# Patient Record
Sex: Female | Born: 1990 | Race: Black or African American | Hispanic: No | Marital: Single | Smoking: Current every day smoker
Health system: Southern US, Community
[De-identification: ages and names within clinical notes are randomized; demographics above are authoritative.]

## PROBLEM LIST (undated history)

## (undated) ENCOUNTER — Inpatient Hospital Stay (HOSPITAL_COMMUNITY): Payer: Self-pay

## (undated) DIAGNOSIS — N159 Renal tubulo-interstitial disease, unspecified: Secondary | ICD-10-CM

## (undated) DIAGNOSIS — B009 Herpesviral infection, unspecified: Secondary | ICD-10-CM

## (undated) DIAGNOSIS — K219 Gastro-esophageal reflux disease without esophagitis: Secondary | ICD-10-CM

## (undated) DIAGNOSIS — D563 Thalassemia minor: Secondary | ICD-10-CM

## (undated) DIAGNOSIS — F32A Depression, unspecified: Secondary | ICD-10-CM

## (undated) DIAGNOSIS — D649 Anemia, unspecified: Secondary | ICD-10-CM

## (undated) DIAGNOSIS — I10 Essential (primary) hypertension: Secondary | ICD-10-CM

## (undated) DIAGNOSIS — F419 Anxiety disorder, unspecified: Secondary | ICD-10-CM

## (undated) HISTORY — PX: NO PAST SURGERIES: SHX2092

---
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
## 2011-03-07 LAB — POCT RAPID STREP A: Streptococcus, Group A Screen (Direct): POSITIVE — AB

## 2011-05-12 ENCOUNTER — Inpatient Hospital Stay (HOSPITAL_COMMUNITY)
Admission: AD | Admit: 2011-05-12 | Discharge: 2011-05-13 | Disposition: A | Discharge status: Home / Self Care | Payer: Medicaid Other | Source: Ambulatory Visit | Attending: Obstetrics and Gynecology | Admitting: Obstetrics and Gynecology

## 2011-05-12 ENCOUNTER — Encounter (HOSPITAL_COMMUNITY): Payer: Self-pay | Admitting: *Deleted

## 2011-05-12 ENCOUNTER — Inpatient Hospital Stay (HOSPITAL_COMMUNITY): Payer: Medicaid Other

## 2011-05-12 DIAGNOSIS — Z30431 Encounter for routine checking of intrauterine contraceptive device: Secondary | ICD-10-CM | POA: Insufficient documentation

## 2011-05-12 DIAGNOSIS — N938 Other specified abnormal uterine and vaginal bleeding: Secondary | ICD-10-CM | POA: Insufficient documentation

## 2011-05-12 DIAGNOSIS — N949 Unspecified condition associated with female genital organs and menstrual cycle: Secondary | ICD-10-CM

## 2011-05-12 HISTORY — DX: Herpesviral infection, unspecified: B00.9

## 2011-05-12 LAB — URINALYSIS, ROUTINE W REFLEX MICROSCOPIC
Glucose, UA: NEGATIVE mg/dL
Ketones, ur: NEGATIVE mg/dL
Protein, ur: NEGATIVE mg/dL
Urobilinogen, UA: 0.2 mg/dL (ref 0.0–1.0)

## 2011-05-12 LAB — WET PREP, GENITAL
Trich, Wet Prep: NONE SEEN
Yeast Wet Prep HPF POC: NONE SEEN

## 2011-05-12 LAB — CBC
HCT: 34.3 % — ABNORMAL LOW (ref 36.0–46.0)
Hemoglobin: 11.5 g/dL — ABNORMAL LOW (ref 12.0–15.0)
MCH: 26.6 pg (ref 26.0–34.0)
MCHC: 33.5 g/dL (ref 30.0–36.0)

## 2011-05-12 LAB — URINE MICROSCOPIC-ADD ON

## 2011-05-12 NOTE — Progress Notes (Signed)
Pt states, " [facility] my [facility] ( mirana) inserted. I have bleed ever since and even pass clots and have had cramping and sharp back pain."

## 2011-05-12 NOTE — Progress Notes (Addendum)
Had IUD placed by [facility] on 02/02/11.  States has been bleeding since then, on average 4-6 pad changes/day, sometimes brown discharge.  C/o cramping and low back pain.

## 2011-05-12 NOTE — ED Provider Notes (Signed)
History     Chief Complaint  Patient presents with  . Vaginal Bleeding  . Dysmenorrhea   HPI  Had IUD placed by [facility] on 02/02/11. States has been bleeding, similar to a period, since then, on average 4-6 pad changes/day, sometimes brown discharge. C/o cramping and low back pain.  Denies dysuria or abnormal vaginal discharge.     Past Medical History  Diagnosis Date  . Migraine   . 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 No past surgeries     No family history on file.  History  Substance Use Topics  . Smoking status: Current Everyday Smoker -- 0.2 packs/day    Types: Cigarettes  . Smokeless tobacco: Never Used  . Alcohol Use: No    Allergies: No Known Allergies  Prescriptions prior to admission  Medication Sig Dispense Refill  . levonorgestrel (MIRENA) 20 MCG/24HR IUD 1 each by Intrauterine route once.          Review of Systems  Gastrointestinal: Positive for abdominal pain.  Genitourinary:       Vaginal bleeding  Musculoskeletal: Positive for back pain.  All other systems reviewed and are negative.   Physical Exam   Blood pressure 117/73, pulse 106, temperature 99.5 F (37.5 C), temperature source Oral, height 5' (1.524 m), weight 71.668 kg (158 lb), last menstrual period 02/02/2011.  Physical Exam  Constitutional: She is oriented to person, place, and time. She appears well-developed and well-nourished.  HENT:  Head: Normocephalic.  Neck: Normal range of motion. Neck supple.  Cardiovascular: Normal rate, regular rhythm and normal heart sounds.   Respiratory: Effort normal and breath sounds normal.  GI: Soft. She exhibits no mass. There is tenderness (suprapubic). There is no guarding.  Genitourinary: There is bleeding (negative clots; scant) around the vagina.  Neurological: She is alert and oriented to person, place, and time.  Skin: Skin is warm and dry.    MAU Course  Procedures  Wet prep - few clue,  neg trich, neg yeast GC/CT - pend UA - trace leuks Ultrasound -  IMPRESSION:  1. Normal study. No evidence of pelvic mass or other significant  abnormality.  2. Intrauterine device noted in expected position, at the fundus  of the uterus.  3. Trace fluid noted within the cervical canal.  CBC - hgb 11.5  Assessment and Plan  Bleeding with IUD  Plan: DC to home Naproxen 500mg  BID x 5 days every 4 weeks x 3 months.   GC/CT pending Urine culture  [facility] 05/12/2011, 10:12 PM

## 2011-05-13 MED ORDER — NAPROXEN 500 MG PO TABS: 500.0000 mg | ORAL_TABLET | Freq: Two times a day (BID) | ORAL | Status: DC

## 2011-05-15 LAB — GC/CHLAMYDIA PROBE AMP, GENITAL: Chlamydia, DNA Probe: POSITIVE — AB

## 2011-05-15 NOTE — ED Provider Notes (Signed)
Agree with above note.  Stacey [name] 05/15/2011 8:50 AM

## 2011-07-08 ENCOUNTER — Encounter (HOSPITAL_COMMUNITY): Payer: Self-pay | Admitting: Emergency Medicine

## 2011-07-08 ENCOUNTER — Emergency Department (HOSPITAL_COMMUNITY)
Admission: EM | Admit: 2011-07-08 | Discharge: 2011-07-08 | Disposition: A | Discharge status: Home / Self Care | Payer: Self-pay | Attending: Emergency Medicine | Admitting: Emergency Medicine

## 2011-07-08 DIAGNOSIS — Z79899 Other long term (current) drug therapy: Secondary | ICD-10-CM | POA: Insufficient documentation

## 2011-07-08 DIAGNOSIS — M546 Pain in thoracic spine: Secondary | ICD-10-CM | POA: Insufficient documentation

## 2011-07-08 DIAGNOSIS — G43909 Migraine, unspecified, not intractable, without status migrainosus: Secondary | ICD-10-CM | POA: Insufficient documentation

## 2011-07-08 DIAGNOSIS — R110 Nausea: Secondary | ICD-10-CM | POA: Insufficient documentation

## 2011-07-08 DIAGNOSIS — M542 Cervicalgia: Secondary | ICD-10-CM | POA: Insufficient documentation

## 2011-07-08 DIAGNOSIS — N898 Other specified noninflammatory disorders of vagina: Secondary | ICD-10-CM | POA: Insufficient documentation

## 2011-07-08 MED ORDER — SODIUM CHLORIDE 0.9 % IV BOLUS (SEPSIS)
1000.0000 mL | Freq: Once | INTRAVENOUS | Status: AC
  Administered 2011-07-08: 1000 mL via INTRAVENOUS

## 2011-07-08 MED ORDER — METOCLOPRAMIDE HCL 5 MG/ML IJ SOLN
10.0000 mg | Freq: Once | INTRAMUSCULAR | Status: AC
  Administered 2011-07-08: 10 mg via INTRAVENOUS
  Filled 2011-07-08: qty 2

## 2011-07-08 MED ORDER — KETOROLAC TROMETHAMINE 30 MG/ML IJ SOLN
30.0000 mg | Freq: Once | INTRAMUSCULAR | Status: AC
  Administered 2011-07-08: 30 mg via INTRAVENOUS
  Filled 2011-07-08: qty 1

## 2011-07-08 MED ORDER — DIPHENHYDRAMINE HCL 50 MG/ML IJ SOLN
25.0000 mg | Freq: Once | INTRAMUSCULAR | Status: AC
  Administered 2011-07-08: 25 mg via INTRAVENOUS
  Filled 2011-07-08: qty 1

## 2011-07-08 NOTE — ED Provider Notes (Signed)
History     CSN: [id number]  Arrival date & time 07/08/11  1550   First MD Initiated Contact with Patient 07/08/11 1803      Chief Complaint  Patient presents with  . Headache    (Consider location/radiation/quality/duration/timing/severity/associated sxs/prior treatment) Patient is a 21 y.o. female presenting with headaches. The history is provided by the patient.  Headache  This is a new problem. Episode onset: about 4 days ago. The problem occurs constantly. The problem has not changed since onset.The headache is associated with activity. Pain location: global. The quality of the pain is described as dull and throbbing. The pain is moderate. Radiates to: down her neck and upper back. Associated symptoms include nausea. Pertinent negatives include no anorexia, no fever, no malaise/fatigue, no chest pressure, no near-syncope, no orthopnea, no palpitations, no syncope, no shortness of breath and no vomiting. She has tried NSAIDs (tylenol) for the symptoms. The treatment provided mild relief.    Past Medical History  Diagnosis Date  . Migraine   . 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 Preeclampsia     Past Surgical History  Procedure Date  . No past surgeries     No family history on file.  History  Substance Use Topics  . Smoking status: Current Everyday Smoker -- 0.2 packs/day    Types: Cigarettes  . Smokeless tobacco: Never Used  . Alcohol Use: No    OB History    Grav Para Term Preterm Abortions TAB SAB Ect Mult Living   1 1 1       1       Review of Systems  Constitutional: Negative for fever, chills, malaise/fatigue, activity change, appetite change and fatigue.  HENT: Positive for neck pain (headache radiates to neck). Negative for congestion, sore throat, rhinorrhea and neck stiffness.   Eyes: Negative for photophobia, redness and visual disturbance.  Respiratory: Negative for cough, shortness of breath and wheezing.   Cardiovascular: Negative for chest  pain, palpitations, orthopnea, leg swelling, syncope and near-syncope.  Gastrointestinal: Positive for nausea. Negative for vomiting, abdominal pain, diarrhea, constipation, blood in stool, abdominal distention and anorexia.  Genitourinary: Positive for vaginal bleeding (spotting for 5 months since mirena IUD placed. ). Negative for dysuria, urgency, hematuria, flank pain, vaginal pain and pelvic pain.  Musculoskeletal: Negative for back pain.  Skin: Negative for rash and wound.  Neurological: Positive for headaches. Negative for dizziness, seizures, facial asymmetry, speech difficulty, weakness, light-headedness and numbness.  Psychiatric/Behavioral: Negative for confusion.  All other systems reviewed and are negative.    Allergies  Review of patient's allergies indicates no known allergies.  Home Medications   Current Outpatient Rx  Name Route Sig Dispense Refill  . AZITHROMYCIN 500 MG PO TABS Oral Take 1,000 mg by mouth once.    . CYCLOBENZAPRINE HCL 10 MG PO TABS Oral Take 10 mg by mouth 3 (three) times daily as needed. For muscle spasms    . DIPHENHYDRAMINE-APAP (SLEEP) 25-500 MG PO TABS Oral Take 1 tablet by mouth at bedtime as needed. For pain    . LEVONORGESTREL 20 MCG/24HR IU IUD Intrauterine 1 each by Intrauterine route once.      Marland Kitchen LORATADINE 10 MG PO TABS Oral Take 10 mg by mouth daily.    . MELOXICAM 15 MG PO TABS Oral Take 15 mg by mouth daily.      BP 121/66  Pulse 88  Temp(Src) 98.8 F (37.1 C) (Oral)  Resp 18  SpO2  97%  Physical Exam  Nursing note and vitals reviewed. Constitutional: She is oriented to person, place, and time. She appears well-developed and well-nourished.  Non-toxic appearance. No distress.  HENT:  Head: Normocephalic and atraumatic.  Mouth/Throat: Oropharynx is clear and moist.  Eyes: Conjunctivae and EOM are normal. Pupils are equal, round, and reactive to light. No scleral icterus.  Neck: Normal range of motion, full passive range of  motion without pain and phonation normal. Neck supple. No JVD present. No spinous process tenderness present. No rigidity. Normal range of motion present. No Brudzinski's sign and no Kernig's sign noted.  Cardiovascular: Normal rate, regular rhythm, normal heart sounds and intact distal pulses.   No murmur heard. Pulmonary/Chest: Effort normal and breath sounds normal. No respiratory distress. She has no wheezes. She has no rales.  Abdominal: Soft. Bowel sounds are normal. She exhibits no distension. There is no tenderness. There is no rebound and no guarding.  Musculoskeletal: Normal range of motion.  Neurological: She is alert and oriented to person, place, and time. She has normal strength and normal reflexes. No cranial nerve deficit or sensory deficit. She exhibits normal muscle tone. Coordination normal. GCS eye subscore is 4. GCS verbal subscore is 5. GCS motor subscore is 6.  Skin: Skin is warm and dry. No rash noted. She is not diaphoretic.  Psychiatric: She has a normal mood and affect. Her speech is normal and behavior is normal. Cognition and memory are normal.    ED Course  Procedures (including critical care time)   Labs Reviewed  POCT PREGNANCY, URINE  POCT PREGNANCY, URINE   No results found.   1. Migraine       MDM  20yo AAF with PMH significant for migraine headaches and chronic back pain who presents to the ED due to headache. HA like prior migraines. HA global dull and throbbing headache with some nausea and scotomas with activity. Pain radiates down back of her head to neck and upper back like prior headaches. Also has chronic back pain. No hx fever. Afebrile in ED. Neck supple no meningeal signs. Not c/w meningitis or SAH. Likely migraine as similar to prior episodes. Pt told RN she has vaginal spotting when asked but this has been going on since she had her IUD placed about 5 months ago. Giving IVF with reglan and benadryl for migraine tx. Will reassess.   Pt  mostly improved. Giving toradol for additional relief.   Pt reassessed and feeling better ready to go home. Will d/c.       Verne Carrow, MD 07/08/11 2226

## 2011-07-08 NOTE — ED Notes (Signed)
Patient here for headache x 4 days, sts that she also has upper neck and upper back pain, requesting a ct scan. sts intermittent vaginal bleeding since august when iud was placed, nad noted, abc intact, resp e/u

## 2011-07-08 NOTE — ED Notes (Addendum)
C/o headache x 4 days.  States she started having neck pain and thoracic back pain 2 days ago.  Also reports dizziness.  Pt reports daily vaginal bleeding since having IUD placed August 2012.

## 2011-07-18 NOTE — ED Provider Notes (Signed)
Evaluation and management procedures were performed by the resident physician under my supervision/collaboration.    Faun Mcqueen D Rosanna Bickle, MD 07/18/11 1954 

## 2011-11-11 ENCOUNTER — Inpatient Hospital Stay (HOSPITAL_COMMUNITY)
Admission: AD | Admit: 2011-11-11 | Discharge: 2011-11-11 | Disposition: A | Discharge status: Home / Self Care | Payer: Self-pay | Source: Ambulatory Visit | Attending: Obstetrics & Gynecology | Admitting: Obstetrics & Gynecology

## 2011-11-11 ENCOUNTER — Encounter (HOSPITAL_COMMUNITY): Payer: Self-pay

## 2011-11-11 ENCOUNTER — Inpatient Hospital Stay (HOSPITAL_COMMUNITY): Payer: Self-pay

## 2011-11-11 DIAGNOSIS — R109 Unspecified abdominal pain: Secondary | ICD-10-CM | POA: Insufficient documentation

## 2011-11-11 DIAGNOSIS — N390 Urinary tract infection, site not specified: Secondary | ICD-10-CM

## 2011-11-11 DIAGNOSIS — N949 Unspecified condition associated with female genital organs and menstrual cycle: Secondary | ICD-10-CM | POA: Insufficient documentation

## 2011-11-11 DIAGNOSIS — N938 Other specified abnormal uterine and vaginal bleeding: Secondary | ICD-10-CM | POA: Insufficient documentation

## 2011-11-11 DIAGNOSIS — R1030 Lower abdominal pain, unspecified: Secondary | ICD-10-CM

## 2011-11-11 HISTORY — DX: Gastro-esophageal reflux disease without esophagitis: K21.9

## 2011-11-11 LAB — CBC
HCT: 35.6 % — ABNORMAL LOW (ref 36.0–46.0)
Hemoglobin: 11.7 g/dL — ABNORMAL LOW (ref 12.0–15.0)
MCH: 26.4 pg (ref 26.0–34.0)
MCHC: 32.9 g/dL (ref 30.0–36.0)
MCV: 80.4 fL (ref 78.0–100.0)
Platelets: 358 10*3/uL (ref 150–400)
RBC: 4.43 MIL/uL (ref 3.87–5.11)
RDW: 14.8 % (ref 11.5–15.5)
WBC: 5.7 10*3/uL (ref 4.0–10.5)

## 2011-11-11 LAB — WET PREP, GENITAL
Clue Cells Wet Prep HPF POC: NONE SEEN
Trich, Wet Prep: NONE SEEN
Yeast Wet Prep HPF POC: NONE SEEN

## 2011-11-11 LAB — URINE MICROSCOPIC-ADD ON

## 2011-11-11 LAB — POCT PREGNANCY, URINE: Preg Test, Ur: NEGATIVE

## 2011-11-11 LAB — URINALYSIS, ROUTINE W REFLEX MICROSCOPIC
Glucose, UA: NEGATIVE mg/dL
Ketones, ur: NEGATIVE mg/dL
Leukocytes, UA: NEGATIVE
Specific Gravity, Urine: 1.02 (ref 1.005–1.030)
pH: 7.5 (ref 5.0–8.0)

## 2011-11-11 MED ORDER — KETOROLAC TROMETHAMINE 10 MG PO TABS
10.0000 mg | ORAL_TABLET | Freq: Once | ORAL | Status: AC
  Administered 2011-11-11: 10 mg via ORAL
  Filled 2011-11-11: qty 1

## 2011-11-11 MED ORDER — CIPROFLOXACIN HCL 500 MG PO TABS: 500.0000 mg | ORAL_TABLET | Freq: Two times a day (BID) | ORAL | Status: AC

## 2011-11-11 MED ORDER — KETOROLAC TROMETHAMINE 10 MG PO TABS: 10.0000 mg | ORAL_TABLET | Freq: Once | ORAL | Status: AC

## 2011-11-11 NOTE — MAU Provider Note (Signed)
Stacey [name] 2.  No chief complaint on file.    First Provider Initiated Contact with Patient 11/11/11 2008      SUBJECTIVE  HPI: Pt presents to MAU reporting bleeding and cramping x 5-6 months since Mirena placed 8/12, stopped x ~2 months. Severe cramping returned 1 month ago. Frequency ix 1 week.  Spotting started today. Saw gynecologist several months ago, states she had pelvic exam, told everything was normal with the Mirena. Denies fever, chills, flank pain, dysuria, hematuria, change in sex partners. Rates cramping 10/10 at worst. There are no aggravating or aleviating factors.   Past Medical History  Diagnosis Date  . Migraine   . 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 Preeclampsia   . Pregnancy induced hypertension   . GERD (gastroesophageal reflux disease)    Past Surgical History  Procedure Date  . No past surgeries    History   Social History  . Marital Status: Single    Spouse Name: N/A    Number of Children: N/A  . Years of Education: N/A   Occupational History  . Not on file.   Social History Main Topics  . Smoking status: Current Everyday Smoker -- 0.2 packs/day    Types: Cigarettes  . Smokeless tobacco: Never Used  . Alcohol Use: No  . Drug Use: No  . Sexually Active: Yes    Birth Control/ Protection: IUD     pt reports she has had vaginal bleeding daily since having IUD placed in August 2012   Other Topics Concern  . Not on file   Social History Narrative  . No narrative on file   No current facility-administered medications on file prior to encounter.   Current Outpatient Prescriptions on File Prior to Encounter  Medication Sig Dispense Refill  . azithromycin (ZITHROMAX) 500 MG tablet Take 1,000 mg by mouth once.      . cyclobenzaprine (FLEXERIL) 10 MG tablet Take 10 mg by mouth 3 (three) times daily as needed. For muscle spasms      . diphenhydramine-acetaminophen (TYLENOL PM) 25-500 MG TABS Take 1 tablet by mouth at bedtime  as needed. For pain      . levonorgestrel (MIRENA) 20 MCG/24HR IUD 1 each by Intrauterine route once.       . loratadine (CLARITIN) 10 MG tablet Take 10 mg by mouth daily.      . meloxicam (MOBIC) 15 MG tablet Take 15 mg by mouth daily.       No Known Allergies  ROS: Pertinent items in HPI  OBJECTIVE Blood pressure 141/85, pulse 103, temperature 99.2 F (37.3 C), temperature source Oral, resp. rate 16, height 5' (1.524 m), weight 68.584 kg (151 lb 3.2 oz), unknown if currently breastfeeding.  GENERAL: Well-developed, well-nourished female in no acute distress.  HEENT: Normocephalic, good dentition HEART: normal rate RESP: normal effort ABDOMEN: Soft, non-tender. No CVAT. EXTREMITIES: Nontender, no edema NEURO: Alert and oriented SPECULUM EXAM: NEFG, physiologic discharge, clear mucus in os, scant blood noted, cervix non-friable. Mirena strings visible. Devise not visible. BIMANUAL: cervix closed; uterus non-tender, non-pregnant size; no adnexal tenderness or masses. No CMT   LAB RESULTS Results for orders placed during the hospital encounter of 11/11/11 (from the past 24 hour(s))  URINALYSIS, ROUTINE W REFLEX MICROSCOPIC     Status: Abnormal   Collection Time   11/11/11  7:25 PM      Component Value Range   Color, Urine YELLOW  YELLOW  APPearance CLEAR  CLEAR    Specific Gravity, Urine 1.020  1.005 - 1.030    pH 7.5  5.0 - 8.0    Glucose, UA NEGATIVE  NEGATIVE (mg/dL)   Hgb urine dipstick TRACE (*) NEGATIVE    Bilirubin Urine NEGATIVE  NEGATIVE    Ketones, ur NEGATIVE  NEGATIVE (mg/dL)   Protein, ur NEGATIVE  NEGATIVE (mg/dL)   Urobilinogen, UA 0.2  0.0 - 1.0 (mg/dL)   Nitrite NEGATIVE  NEGATIVE    Leukocytes, UA NEGATIVE  NEGATIVE   URINE MICROSCOPIC-ADD ON     Status: Normal   Collection Time   11/11/11  7:25 PM      Component Value Range   Squamous Epithelial / LPF RARE  RARE    RBC / HPF 0-2  <3 (RBC/hpf)  POCT PREGNANCY, URINE     Status: Normal   Collection Time    11/11/11  7:38 PM      Component Value Range   Preg Test, Ur NEGATIVE  NEGATIVE   CBC     Status: Abnormal   Collection Time   11/11/11  8:13 PM      Component Value Range   WBC 5.7  4.0 - 10.5 (K/uL)   RBC 4.43  3.87 - 5.11 (MIL/uL)   Hemoglobin 11.7 (*) 12.0 - 15.0 (g/dL)   HCT 45.4 (*) 09.8 - 46.0 (%)   MCV 80.4  78.0 - 100.0 (fL)   MCH 26.4  26.0 - 34.0 (pg)   MCHC 32.9  30.0 - 36.0 (g/dL)   RDW 11.9  14.7 - 82.9 (%)   Platelets 358  150 - 400 (K/uL)   IMAGING US Transvaginal Non-ob  11/11/2011  *RADIOLOGY REPORT*  Clinical Data: Spotting and cramping.  Intrauterine device.  TRANSABDOMINAL AND TRANSVAGINAL ULTRASOUND OF PELVIS Technique:  Both transabdominal and transvaginal ultrasound examinations of the pelvis were performed. Transabdominal technique was performed for global imaging of the pelvis including uterus, ovaries, adnexal regions, and pelvic cul-de-sac.  Comparison: 05/12/2011   It was necessary to proceed with endovaginal exam following the transabdominal exam to visualize the uterus, ovaries, and endometrium.  Findings:  Uterus: The uterus appears retroverted and measures about 6.6 x 3.3 x 4.3 cm.  No focal myometrial mass lesions.  Limited visualization of the cervix due to the position of the uterus.  Endometrium: Endometrial stripe thickness measures about 5 mm. Echogenic structure consistent with intrauterine device is present in the endometrium in the fundal location.  No abnormal endometrial fluid collections.  Right ovary:  Right ovary measures 3.2 x 1.8 x 2.8 cm.  Normal follicular changes.  Color flow is demonstrated.  Left ovary: Left ovary measures 3.2 x 1.7 x 2 cm.  Normal follicular changes.  Color flow is demonstrated.  Other findings: No free fluid  IMPRESSION: Intrauterine device appears in place.  No abnormal adnexal masses. No free pelvic fluid collections.  Original Report Authenticated By: Marlon Pel, M.D.   US Pelvis Complete  11/11/2011   *RADIOLOGY REPORT*  Clinical Data: Spotting and cramping.  Intrauterine device.  TRANSABDOMINAL AND TRANSVAGINAL ULTRASOUND OF PELVIS Technique:  Both transabdominal and transvaginal ultrasound examinations of the pelvis were performed. Transabdominal technique was performed for global imaging of the pelvis including uterus, ovaries, adnexal regions, and pelvic cul-de-sac.  Comparison: 05/12/2011   It was necessary to proceed with endovaginal exam following the transabdominal exam to visualize the uterus, ovaries, and endometrium.  Findings:  Uterus: The uterus appears retroverted and measures about 6.6  x 3.3 x 4.3 cm.  No focal myometrial mass lesions.  Limited visualization of the cervix due to the position of the uterus.  Endometrium: Endometrial stripe thickness measures about 5 mm. Echogenic structure consistent with intrauterine device is present in the endometrium in the fundal location.  No abnormal endometrial fluid collections.  Right ovary:  Right ovary measures 3.2 x 1.8 x 2.8 cm.  Normal follicular changes.  Color flow is demonstrated.  Left ovary: Left ovary measures 3.2 x 1.7 x 2 cm.  Normal follicular changes.  Color flow is demonstrated.  Other findings: No free fluid  IMPRESSION: Intrauterine device appears in place.  No abnormal adnexal masses. No free pelvic fluid collections.  Original Report Authenticated By: Marlon Pel, M.D.   Received Toradol for pian, but did not feel as if she needed to wait for onset of action before D/C. NAD.  ASSESSMENT IUD properly positioned 1. UTI (lower urinary tract infection)   2. Lower abdominal pain    PLAN D/C home. Follow-up Information    Follow up with [facility]. (As needed if symptoms worsen)    Contact information:   [address] [place] [address] [phone]        Medication List  As of 11/12/2011  2:23 AM   START taking these medications         ciprofloxacin 500 MG tablet   Commonly  known as: CIPRO   Take 1 tablet (500 mg total) by mouth 2 (two) times daily.      ketorolac 10 MG tablet   Commonly known as: TORADOL   Take 1 tablet (10 mg total) by mouth once.         CONTINUE taking these medications         levonorgestrel 20 MCG/24HR IUD   Commonly known as: MIRENA         STOP taking these medications         azithromycin 500 MG tablet      cyclobenzaprine 10 MG tablet      diphenhydramine-acetaminophen 25-500 MG Tabs      loratadine 10 MG tablet      meloxicam 15 MG tablet          Where to get your medications    These are the prescriptions that you need to pick up.   You may get these medications from any pharmacy.         ciprofloxacin 500 MG tablet   ketorolac 10 MG tablet          GC/CT pending    Dorathy Kinsman 11/11/2011 8:36 PM

## 2011-11-11 NOTE — MAU Note (Signed)
Lower abdominal cramping x1 month. Light pink spotting since today. Clear discharge with no odor x1 week. Mirena placed in August of 2012.

## 2012-01-10 ENCOUNTER — Telehealth: Payer: Self-pay | Admitting: Advanced Practice Midwife

## 2012-01-10 NOTE — Telephone Encounter (Signed)
Calling for results from MAU visit 11/11/11 and asking to have refill of Flagyl for BV because she never filled it. Informed pt that we cannot refill Rx. If new Sx, needs to be retested.

## 2012-01-17 ENCOUNTER — Encounter (HOSPITAL_COMMUNITY): Payer: Self-pay | Admitting: *Deleted

## 2012-01-17 ENCOUNTER — Inpatient Hospital Stay (HOSPITAL_COMMUNITY)
Admission: AD | Admit: 2012-01-17 | Discharge: 2012-01-17 | Disposition: A | Discharge status: Home / Self Care | Payer: Medicaid Other | Source: Ambulatory Visit | Attending: Obstetrics & Gynecology | Admitting: Obstetrics & Gynecology

## 2012-01-17 DIAGNOSIS — Z30431 Encounter for routine checking of intrauterine contraceptive device: Secondary | ICD-10-CM | POA: Insufficient documentation

## 2012-01-17 DIAGNOSIS — A499 Bacterial infection, unspecified: Secondary | ICD-10-CM

## 2012-01-17 DIAGNOSIS — N760 Acute vaginitis: Secondary | ICD-10-CM | POA: Insufficient documentation

## 2012-01-17 DIAGNOSIS — Z975 Presence of (intrauterine) contraceptive device: Secondary | ICD-10-CM

## 2012-01-17 DIAGNOSIS — R109 Unspecified abdominal pain: Secondary | ICD-10-CM | POA: Insufficient documentation

## 2012-01-17 DIAGNOSIS — B9689 Other specified bacterial agents as the cause of diseases classified elsewhere: Secondary | ICD-10-CM | POA: Insufficient documentation

## 2012-01-17 LAB — URINALYSIS, ROUTINE W REFLEX MICROSCOPIC
Glucose, UA: NEGATIVE mg/dL
Hgb urine dipstick: NEGATIVE
Specific Gravity, Urine: 1.025 (ref 1.005–1.030)
Urobilinogen, UA: 0.2 mg/dL (ref 0.0–1.0)
pH: 6 (ref 5.0–8.0)

## 2012-01-17 LAB — URINE MICROSCOPIC-ADD ON

## 2012-01-17 LAB — CBC
Hemoglobin: 11.7 g/dL — ABNORMAL LOW (ref 12.0–15.0)
MCV: 81.7 fL (ref 78.0–100.0)
Platelets: 312 10*3/uL (ref 150–400)
RBC: 4.42 MIL/uL (ref 3.87–5.11)
WBC: 5.4 10*3/uL (ref 4.0–10.5)

## 2012-01-17 LAB — POCT PREGNANCY, URINE: Preg Test, Ur: NEGATIVE

## 2012-01-17 LAB — WET PREP, GENITAL: Trich, Wet Prep: NONE SEEN

## 2012-01-17 MED ORDER — TINIDAZOLE 500 MG PO TABS: 500.0000 mg | ORAL_TABLET | Freq: Every day | ORAL | Status: AC

## 2012-01-17 MED ORDER — METRONIDAZOLE 0.75 % VA GEL: 1.0000 | Freq: Two times a day (BID) | VAGINAL | Status: AC

## 2012-01-17 MED ORDER — KETOROLAC TROMETHAMINE 10 MG PO TABS
10.0000 mg | ORAL_TABLET | ORAL | Status: AC
  Administered 2012-01-17: 10 mg via ORAL
  Filled 2012-01-17: qty 1

## 2012-01-17 MED ORDER — IBUPROFEN 600 MG PO TABS: 600.0000 mg | ORAL_TABLET | Freq: Four times a day (QID) | ORAL | Status: AC | PRN

## 2012-01-17 NOTE — MAU Note (Signed)
Pt reports lower abd cramping that has continued since 5/26 visit here. States the pain has been going on since IUD was placed in august of last year. Denies dysuria.

## 2012-01-17 NOTE — MAU Provider Note (Signed)
EVANIA LYNE y.o.[id number]  Chief Complaint  Patient presents with  . Abdominal Pain     First Provider Initiated Contact with Patient 01/17/12 2144      SUBJECTIVE  HPI: Pt presents to MAU with abdominal cramping described as "like labor contractions" and lower back pain which has been consistent since her IUD was placed in August.  There has been no recent change to her pain.  She was seen in MAU on 11/11/2011, had labs and ultrasound, and treated for uncomplicated UTI.  Pt did delay in picking up her Cipro prescription but did complete her therapy a couple of weeks ago.  She denies urinary symptoms today, and also denies n/v, h/a, dizziness, or fever/chills.  She does NOT desire removal of her IUD at this time.  Past Medical History  Diagnosis Date  . Migraine   . 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 Preeclampsia   . Pregnancy induced hypertension   . GERD (gastroesophageal reflux disease)    Past Surgical History  Procedure Date  . No past surgeries    History   Social History  . Marital Status: Single    Spouse Name: N/A    Number of Children: N/A  . Years of Education: N/A   Occupational History  . Not on file.   Social History Main Topics  . Smoking status: Current Everyday Smoker -- 0.2 packs/day    Types: Cigarettes, Cigars  . Smokeless tobacco: Never Used  . Alcohol Use: No  . Drug Use: No  . Sexually Active: Yes    Birth Control/ Protection: IUD     pt reports she has had vaginal bleeding daily since having IUD placed in August 2012   Other Topics Concern  . Not on file   Social History Narrative  . No narrative on file   No current facility-administered medications on file prior to encounter.   No current outpatient prescriptions on file prior to encounter.   No Known Allergies  ROS: Pertinent items in HPI  OBJECTIVE Blood pressure 121/53, pulse 100, temperature 98.8 F (37.1 C), temperature source Oral, resp. rate 16, height 5' (1.524  m), weight 68.947 kg (152 lb), SpO2 100.00%, unknown if currently breastfeeding.  GENERAL: Well-developed, well-nourished female in no acute distress.  HEENT: Normocephalic, good dentition HEART: normal rate RESP: normal effort ABDOMEN: Soft, nontender EXTREMITIES: Nontender, no edema NEURO: Alert and oriented SPECULUM EXAM:   LAB RESULTS  Results for orders placed during the hospital encounter of 01/17/12 (from the past 24 hour(s))  URINALYSIS, ROUTINE W REFLEX MICROSCOPIC     Status: Abnormal   Collection Time   01/17/12  9:10 PM      Component Value Range   Color, Urine YELLOW  YELLOW   APPearance CLEAR  CLEAR   Specific Gravity, Urine 1.025  1.005 - 1.030   pH 6.0  5.0 - 8.0   Glucose, UA NEGATIVE  NEGATIVE mg/dL   Hgb urine dipstick NEGATIVE  NEGATIVE   Bilirubin Urine NEGATIVE  NEGATIVE   Ketones, ur NEGATIVE  NEGATIVE mg/dL   Protein, ur NEGATIVE  NEGATIVE mg/dL   Urobilinogen, UA 0.2  0.0 - 1.0 mg/dL   Nitrite NEGATIVE  NEGATIVE   Leukocytes, UA TRACE (*) NEGATIVE  URINE MICROSCOPIC-ADD ON     Status: Normal   Collection Time   01/17/12  9:10 PM      Component Value Range   Squamous Epithelial / LPF RARE  RARE  WBC, UA 0-2  <3 WBC/hpf   RBC / HPF 0-2  <3 RBC/hpf   Urine-Other MUCOUS PRESENT    POCT PREGNANCY, URINE     Status: Normal   Collection Time   01/17/12  9:17 PM      Component Value Range   Preg Test, Ur NEGATIVE  NEGATIVE  CBC     Status: Abnormal   Collection Time   01/17/12 10:05 PM      Component Value Range   WBC 5.4  4.0 - 10.5 K/uL   RBC 4.42  3.87 - 5.11 MIL/uL   Hemoglobin 11.7 (*) 12.0 - 15.0 g/dL   HCT 16.1  09.6 - 04.5 %   MCV 81.7  78.0 - 100.0 fL   MCH 26.5  26.0 - 34.0 pg   MCHC 32.4  30.0 - 36.0 g/dL   RDW 40.9  81.1 - 91.4 %   Platelets 312  150 - 400 K/uL  WET PREP, GENITAL     Status: Abnormal   Collection Time   01/17/12 10:30 PM      Component Value Range   Yeast Wet Prep HPF POC NONE SEEN  NONE SEEN   Trich, Wet Prep NONE  SEEN  NONE SEEN   Clue Cells Wet Prep HPF POC MODERATE (*) NONE SEEN   WBC, Wet Prep HPF POC MODERATE (*) NONE SEEN    ASSESSMENT Bacterial vaginosis Abdominal cramping with IUD   PLAN D/C home Pt has no insurance but would like to try Metrogel if she can afford it, unsure of cost of metrogel Prescription for Tinidazole 500 mg BID x7 days AND Metrogel but pt is to take one of these treatments  Ibuprofen 600 mg Q6 hours F/U with health department if ongoing issues with IUD Return to MAU as needed Medication List  As of 01/17/2012 11:28 PM   TAKE these medications         acetaminophen 500 MG tablet   Commonly known as: TYLENOL   Take 500 mg by mouth every 6 (six) hours as needed. For migraine or headache      aspirin-acetaminophen-caffeine 250-250-65 MG per tablet   Commonly known as: EXCEDRIN MIGRAINE   Take 1 tablet by mouth every 6 (six) hours as needed. For migraine or headache      ibuprofen 600 MG tablet   Commonly known as: ADVIL,MOTRIN   Take 1 tablet (600 mg total) by mouth every 6 (six) hours as needed for pain.      metroNIDAZOLE 0.75 % vaginal gel   Commonly known as: METROGEL   Place 1 Applicatorful vaginally 2 (two) times daily. jscript:void(0)      tinidazole 500 MG tablet   Commonly known as: TINDAMAX   Take 1 tablet (500 mg total) by mouth daily with breakfast.                LEFTWICH-KIRBY, Keirra Zeimet 01/17/2012 9:57 PM

## 2012-01-18 NOTE — MAU Provider Note (Signed)
Attestation of Attending Supervision of Advanced Practitioner (CNM/NP): Evaluation and management procedures were performed by the Advanced Practitioner under my supervision and collaboration.  I have reviewed the Advanced Practitioner's note and chart, and I agree with the management and plan.  Shourya Macpherson, MD, FACOG Attending Obstetrician & Gynecologist Faculty Practice, [facility], [place]  

## 2012-02-09 ENCOUNTER — Telehealth (HOSPITAL_COMMUNITY): Payer: Self-pay | Admitting: Nurse Practitioner

## 2012-02-09 NOTE — Telephone Encounter (Signed)
Patient can not afford Metrogel or Tindimax.  Request Rx for Flagyl be called to [facility]. Rx called in for patient. Flagyl 500 mg po bid x 7 days.

## 2012-04-14 ENCOUNTER — Encounter (HOSPITAL_COMMUNITY): Payer: Self-pay | Admitting: *Deleted

## 2012-04-14 ENCOUNTER — Emergency Department (HOSPITAL_COMMUNITY)
Admission: EM | Admit: 2012-04-14 | Discharge: 2012-04-14 | Disposition: A | Discharge status: Home / Self Care | Payer: Self-pay | Attending: Emergency Medicine | Admitting: Emergency Medicine

## 2012-04-14 ENCOUNTER — Emergency Department (HOSPITAL_COMMUNITY): Payer: Self-pay

## 2012-04-14 DIAGNOSIS — F17200 Nicotine dependence, unspecified, uncomplicated: Secondary | ICD-10-CM | POA: Insufficient documentation

## 2012-04-14 DIAGNOSIS — Z79899 Other long term (current) drug therapy: Secondary | ICD-10-CM | POA: Insufficient documentation

## 2012-04-14 DIAGNOSIS — R091 Pleurisy: Secondary | ICD-10-CM

## 2012-04-14 DIAGNOSIS — Z7982 Long term (current) use of aspirin: Secondary | ICD-10-CM | POA: Insufficient documentation

## 2012-04-14 DIAGNOSIS — Z8669 Personal history of other diseases of the nervous system and sense organs: Secondary | ICD-10-CM | POA: Insufficient documentation

## 2012-04-14 DIAGNOSIS — J90 Pleural effusion, not elsewhere classified: Secondary | ICD-10-CM | POA: Insufficient documentation

## 2012-04-14 DIAGNOSIS — K219 Gastro-esophageal reflux disease without esophagitis: Secondary | ICD-10-CM | POA: Insufficient documentation

## 2012-04-14 DIAGNOSIS — M549 Dorsalgia, unspecified: Secondary | ICD-10-CM | POA: Insufficient documentation

## 2012-04-14 DIAGNOSIS — B009 Herpesviral infection, unspecified: Secondary | ICD-10-CM | POA: Insufficient documentation

## 2012-04-14 LAB — CBC WITH DIFFERENTIAL/PLATELET
HCT: 35.4 % — ABNORMAL LOW (ref 36.0–46.0)
Hemoglobin: 11.8 g/dL — ABNORMAL LOW (ref 12.0–15.0)
Lymphocytes Relative: 40 % (ref 12–46)
MCHC: 33.3 g/dL (ref 30.0–36.0)
Monocytes Absolute: 0.3 10*3/uL (ref 0.1–1.0)
Monocytes Relative: 9 % (ref 3–12)
Neutro Abs: 1.9 10*3/uL (ref 1.7–7.7)
WBC: 4 10*3/uL (ref 4.0–10.5)

## 2012-04-14 LAB — BASIC METABOLIC PANEL
BUN: 7 mg/dL (ref 6–23)
CO2: 24 mEq/L (ref 19–32)
Chloride: 103 mEq/L (ref 96–112)
Creatinine, Ser: 0.63 mg/dL (ref 0.50–1.10)

## 2012-04-14 MED ORDER — IBUPROFEN 800 MG PO TABS: 800.0000 mg | ORAL_TABLET | Freq: Three times a day (TID) | ORAL | Status: DC

## 2012-04-14 MED ORDER — HYDROCODONE-ACETAMINOPHEN 5-325 MG PO TABS: 1.0000 | ORAL_TABLET | ORAL | Status: DC | PRN

## 2012-04-14 MED ORDER — KETOROLAC TROMETHAMINE 60 MG/2ML IM SOLN
60.0000 mg | Freq: Once | INTRAMUSCULAR | Status: AC
  Administered 2012-04-14: 60 mg via INTRAMUSCULAR
  Filled 2012-04-14: qty 2

## 2012-04-14 MED ORDER — DIAZEPAM 5 MG PO TABS
5.0000 mg | ORAL_TABLET | Freq: Once | ORAL | Status: AC
  Administered 2012-04-14: 5 mg via ORAL
  Filled 2012-04-14: qty 1

## 2012-04-14 NOTE — ED Notes (Signed)
Pt states that she will f/u with pcp if needed.  Pt ambulatory to exit without difficulty.

## 2012-04-14 NOTE — ED Provider Notes (Signed)
History     CSN: [id number]  Arrival date & time 04/14/12  1736   First MD Initiated Contact with Patient 04/14/12 2029      Chief Complaint  Patient presents with  . Chest Pain  . Back Pain    (Consider location/radiation/quality/duration/timing/severity/associated sxs/prior treatment) HPI History provided by pt.   Pt presents w/ c/o upper back pain and chest pain.  Back pain started this morning.  It is severe, constant and pleuritic.  Has had similar back pain multiple times in the past but it usually resolves quickly and has never been pleuritic.  The pain seems to radiate through to the chest, but only in the past couple hours.  Denies fever, cough, SOB, abd pain.  H/o GERD but this feels different; non-positional and not affected by pos.  Denies trauma.  No RF for PE and denies LE pain/edema.    Past Medical History  Diagnosis Date  . Migraine   . 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 Preeclampsia   . Pregnancy induced hypertension   . GERD (gastroesophageal reflux disease)     Past Surgical History  Procedure Date  . No past surgeries     Family History  Problem Relation Age of Onset  . Anesthesia problems Neg Hx     History  Substance Use Topics  . Smoking status: Current Every Day Smoker -- 0.2 packs/day    Types: Cigarettes, Cigars  . Smokeless tobacco: Never Used  . Alcohol Use: No    OB History    Grav Para Term Preterm Abortions TAB SAB Ect Mult Living   1 1 1  0 0 0 0 0 0 1      Review of Systems  All other systems reviewed and are negative.    Allergies  Review of patient's allergies indicates no known allergies.  Home Medications   Current Outpatient Rx  Name Route Sig Dispense Refill  . ACETAMINOPHEN 500 MG PO TABS Oral Take 500 mg by mouth every 6 (six) hours as needed. For migraine or headache    . ASPIRIN-ACETAMINOPHEN-CAFFEINE 250-250-65 MG PO TABS Oral Take 1 tablet by mouth every 6 (six) hours as needed. For migraine or  headache      BP 107/70  Pulse 90  Temp 98 F (36.7 C) (Oral)  Resp 18  SpO2 100%  LMP 04/07/2012  Physical Exam  Nursing note and vitals reviewed. Constitutional: She is oriented to person, place, and time. She appears well-developed and well-nourished. No distress.  HENT:  Head: Normocephalic and atraumatic.  Eyes:       Normal appearance  Neck: Normal range of motion.  Cardiovascular: Normal rate, regular rhythm and intact distal pulses.   Pulmonary/Chest: Effort normal and breath sounds normal. No respiratory distress. She exhibits no tenderness.       Appears uncomfortable w/ deep inspiration.  Tenderness left and right sternal border.  Pain in center of chest reproducible w/ passive ROM upper extremities.   Abdominal: Soft. Bowel sounds are normal. She exhibits no distension. There is no tenderness. There is no guarding.  Musculoskeletal: Normal range of motion.       Tenderness left mid back.  No peripheral edema or calf tenderness  Neurological: She is alert and oriented to person, place, and time.  Skin: Skin is warm and dry. No rash noted.  Psychiatric: She has a normal mood and affect. Her behavior is normal.    ED Course  Procedures (including  critical care time)   Date: 04/14/2012  Rate: 81  Rhythm: normal sinus rhythm  QRS Axis: normal  Intervals: normal  ST/T Wave abnormalities: normal  Conduction Disutrbances:none  Narrative Interpretation:   Old EKG Reviewed: none available   Labs Reviewed  CBC WITH DIFFERENTIAL - Abnormal; Notable for the following:    Hemoglobin 11.8 (*)     HCT 35.4 (*)     All other components within normal limits  BASIC METABOLIC PANEL  POCT PREGNANCY, URINE   Dg Chest 2 View  04/14/2012  *RADIOLOGY REPORT*  Clinical Data: Chest pain  CHEST - 2 VIEW  Comparison:  None.  Findings:  The heart size and mediastinal contours are within normal limits.  Both lungs are clear.  The visualized skeletal structures are unremarkable.   IMPRESSION: No active cardiopulmonary disease.   Original Report Authenticated By: Camelia Phenes, M.D.      1. Pleurisy       MDM  Healthy Irena Reichmann F presents w/ non-traumatic, pleuritic upper back pain since this morning, that has started to radiate through to center of chest over past couple hours.  No associated sx.  Pain reproducible w/ palpation of left mid-back and bilateral sternal border as well as LUE ROM on exam.  CXR neg.  No RF for or exam findings consistent w/ PE.  EKG non-ischemic.  Will treat symptomatically.  Pt recieved IM toradol and valium in ED and d/c'd home w/ vicodin and 800mg  ibuprofen.  Recommended rest and ice.  Return precautions discussed.          Arie Sabina Meckling, [place] 04/14/12 2203

## 2012-04-14 NOTE — ED Notes (Signed)
PT is here with chest pain and hurts to take a deep breath.  Pt states upper back pain with sob

## 2012-04-16 ENCOUNTER — Emergency Department (HOSPITAL_COMMUNITY)
Admission: EM | Admit: 2012-04-16 | Discharge: 2012-04-16 | Disposition: A | Discharge status: Home / Self Care | Payer: Self-pay | Attending: Emergency Medicine | Admitting: Emergency Medicine

## 2012-04-16 DIAGNOSIS — Z8619 Personal history of other infectious and parasitic diseases: Secondary | ICD-10-CM | POA: Insufficient documentation

## 2012-04-16 DIAGNOSIS — J029 Acute pharyngitis, unspecified: Secondary | ICD-10-CM | POA: Insufficient documentation

## 2012-04-16 DIAGNOSIS — F17200 Nicotine dependence, unspecified, uncomplicated: Secondary | ICD-10-CM | POA: Insufficient documentation

## 2012-04-16 DIAGNOSIS — M549 Dorsalgia, unspecified: Secondary | ICD-10-CM | POA: Insufficient documentation

## 2012-04-16 DIAGNOSIS — Z8669 Personal history of other diseases of the nervous system and sense organs: Secondary | ICD-10-CM | POA: Insufficient documentation

## 2012-04-16 DIAGNOSIS — K219 Gastro-esophageal reflux disease without esophagitis: Secondary | ICD-10-CM | POA: Insufficient documentation

## 2012-04-16 MED ORDER — HYDROCODONE-ACETAMINOPHEN 7.5-500 MG/15ML PO SOLN
10.0000 mL | Freq: Once | ORAL | Status: AC
  Administered 2012-04-16: 10 mL via ORAL
  Filled 2012-04-16: qty 15

## 2012-04-16 MED ORDER — PENICILLIN G BENZATHINE 1200000 UNIT/2ML IM SUSP
1.2000 10*6.[IU] | Freq: Once | INTRAMUSCULAR | Status: AC
  Administered 2012-04-16: 1.2 10*6.[IU] via INTRAMUSCULAR
  Filled 2012-04-16: qty 2

## 2012-04-16 MED ORDER — METHYLPREDNISOLONE SODIUM SUCC 125 MG IJ SOLR
125.0000 mg | Freq: Once | INTRAMUSCULAR | Status: AC
  Administered 2012-04-16: 125 mg via INTRAMUSCULAR
  Filled 2012-04-16: qty 2

## 2012-04-16 NOTE — ED Provider Notes (Signed)
History     CSN: [id number]  Arrival date & time 04/16/12  1419   First MD Initiated Contact with Patient 04/16/12 1732      Chief Complaint  Patient presents with  . Sore Throat  . Back Pain    (Consider location/radiation/quality/duration/timing/severity/associated sxs/prior treatment) HPI  .pt to the ER with sore throat that started yesterday morning. She is also having continued back pain which she was seen for and it was worked up yesterday. The pain is low back and not different from pain that she has had in the past. She denies any involvement with her breathing. No wheezing, no recent travel. She also denies injury. X-ray from previous visit was normal. She denies having fevers, chills, ear pain or cough. nad vss  Past Medical History  Diagnosis Date  . Migraine   . 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 Preeclampsia   . Pregnancy induced hypertension   . GERD (gastroesophageal reflux disease)     Past Surgical History  Procedure Date  . No past surgeries     Family History  Problem Relation Age of Onset  . Anesthesia problems Neg Hx     History  Substance Use Topics  . Smoking status: Current Every Day Smoker -- 0.2 packs/day    Types: Cigarettes, Cigars  . Smokeless tobacco: Never Used  . Alcohol Use: No    OB History    Grav Para Term Preterm Abortions TAB SAB Ect Mult Living   1 1 1  0 0 0 0 0 0 1      Review of Systems  Review of Systems  Gen: no weight loss, fevers, chills, night sweats  Eyes: no discharge or drainage, no occular pain or visual changes  Nose: no epistaxis or rhinorrhea  Mouth: no dental pain, + sore throat  Neck: no neck pain  Lungs:No wheezing, coughing or hemoptysis CV: no chest pain, palpitations, dependent edema or orthopnea  Abd: no abdominal pain, nausea, vomiting  GU: no dysuria or gross hematuria  MSK:  + low back pain Neuro: no headache, no focal neurologic deficits  Skin: no abnormalities Psyche:  negative.   Allergies  Review of patient's allergies indicates no known allergies.  Home Medications   Current Outpatient Rx  Name Route Sig Dispense Refill  . HYDROCODONE-ACETAMINOPHEN 5-325 MG PO TABS Oral Take 1 tablet by mouth every 4 (four) hours as needed for pain. 20 tablet 0  . IBUPROFEN 800 MG PO TABS Oral Take 1 tablet (800 mg total) by mouth 3 (three) times daily. 12 tablet 0    BP 104/60  Pulse 96  Temp 98.8 F (37.1 C) (Oral)  Resp 20  Ht 5' (1.524 m)  Wt 150 lb (68.04 kg)  BMI 29.30 kg/m2  SpO2 99%  LMP 04/07/2012  Physical Exam  Nursing note and vitals reviewed. Constitutional: She is oriented to person, place, and time. She appears well-developed and well-nourished. No distress.  HENT:  Head: Normocephalic and atraumatic. No trismus in the jaw.  Right Ear: Tympanic membrane, external ear and ear canal normal.  Left Ear: Tympanic membrane, external ear and ear canal normal.  Nose: Nose normal. No rhinorrhea. Right sinus exhibits no maxillary sinus tenderness and no frontal sinus tenderness. Left sinus exhibits no maxillary sinus tenderness and no frontal sinus tenderness.  Mouth/Throat: Uvula is midline and mucous membranes are normal. Normal dentition. No dental abscesses or uvula swelling. Oropharyngeal exudate (exudates covering bilateral tonsils) and posterior  oropharyngeal edema present. No posterior oropharyngeal erythema or tonsillar abscesses.         No submental edema, tongue not elevated, no trismus. No impending airway obstruction; Pt able to speak full sentences, swallow intact, no drooling, stridor, or tonsillar/uvula displacement. No palatal petechia  Eyes: Conjunctivae normal are normal.  Neck: Trachea normal, normal range of motion and full passive range of motion without pain. Neck supple. No rigidity. Normal range of motion present. No Brudzinski's sign noted.       Flexion and extension of neck without pain or difficulty. Able to breath  without difficulty in extension.  Cardiovascular: Normal rate and regular rhythm.   Pulmonary/Chest: Effort normal and breath sounds normal. No stridor. No respiratory distress. She has no wheezes.  Abdominal: Soft. There is no tenderness.       No obvious evidence of splenomegaly. Non ttp.   Musculoskeletal: Normal range of motion.       Arms:       Equal strength to bilateral lower extremities. Neurosensory  function adequate to both legs. Skin color is normal. Skin is warm and moist. I see no step off deformity, no bony tenderness. Pt is able to ambulate without limp. Pain is relieved when sitting in certain positions. ROM is decreased due to pain. No crepitus, laceration, effusion, swelling.  Pulses are normal   Lymphadenopathy:       Head (right side): No preauricular and no posterior auricular adenopathy present.       Head (left side): No preauricular and no posterior auricular adenopathy present.    She has cervical adenopathy.  Neurological: She is alert and oriented to person, place, and time.  Skin: Skin is warm and dry. No rash noted. She is not diaphoretic.  Psychiatric: She has a normal mood and affect.    ED Course  Procedures (including critical care time)  Labs Reviewed - No data to display No results found.   1. Pharyngitis       MDM  Pt given IM penicillin in ER, IM solumedrol 125mg  and some pain medications. No rx given. She can use Tylenol/Motrin for pain at home.    Pt has been advised of the symptoms that warrant their return to the ED. Patient has voiced understanding and has agreed to follow-up with the PCP or specialist.      Dorthula Matas, PA 04/16/12 1824

## 2012-04-16 NOTE — ED Provider Notes (Signed)
Medical screening examination/treatment/procedure(s) were performed by non-physician practitioner and as supervising physician I was immediately available for consultation/collaboration.  Ivann Trimarco, MD 04/16/12 2356 

## 2012-04-16 NOTE — ED Provider Notes (Signed)
Medical screening examination/treatment/procedure(s) were performed by non-physician practitioner and as supervising physician I was immediately available for consultation/collaboration.   Lyanne Co, MD 04/16/12 [phone]

## 2012-04-16 NOTE — ED Notes (Signed)
Pt states that she woke up yesterday morning with an extremely sore throat, pt has been having back and chest pain since Monday of this week and was seen at [facility] ED, they did a chest xray and everything was normal, chest pain went away and now the sore throat and back pain are her only complaints.

## 2012-05-23 ENCOUNTER — Encounter (HOSPITAL_COMMUNITY): Payer: Self-pay | Admitting: *Deleted

## 2012-05-23 ENCOUNTER — Inpatient Hospital Stay (HOSPITAL_COMMUNITY)
Admission: AD | Admit: 2012-05-23 | Discharge: 2012-05-23 | Disposition: A | Discharge status: Home / Self Care | Payer: Medicaid Other | Source: Ambulatory Visit | Attending: Obstetrics & Gynecology | Admitting: Obstetrics & Gynecology

## 2012-05-23 DIAGNOSIS — R51 Headache: Secondary | ICD-10-CM | POA: Insufficient documentation

## 2012-05-23 DIAGNOSIS — J069 Acute upper respiratory infection, unspecified: Secondary | ICD-10-CM | POA: Insufficient documentation

## 2012-05-23 DIAGNOSIS — T839XXA Unspecified complication of genitourinary prosthetic device, implant and graft, initial encounter: Secondary | ICD-10-CM

## 2012-05-23 DIAGNOSIS — Z30432 Encounter for removal of intrauterine contraceptive device: Secondary | ICD-10-CM | POA: Insufficient documentation

## 2012-05-23 DIAGNOSIS — R109 Unspecified abdominal pain: Secondary | ICD-10-CM | POA: Insufficient documentation

## 2012-05-23 LAB — URINALYSIS, ROUTINE W REFLEX MICROSCOPIC
Bilirubin Urine: NEGATIVE
Glucose, UA: NEGATIVE mg/dL
Nitrite: NEGATIVE
Specific Gravity, Urine: >1.03 — ABNORMAL HIGH (ref 1.005–1.030)
pH: 5.5 (ref 5.0–8.0)

## 2012-05-23 LAB — POCT PREGNANCY, URINE: Preg Test, Ur: NEGATIVE

## 2012-05-23 LAB — WET PREP, GENITAL
Trich, Wet Prep: NONE SEEN
Yeast Wet Prep HPF POC: NONE SEEN

## 2012-05-23 LAB — URINE MICROSCOPIC-ADD ON

## 2012-05-23 MED ORDER — IBUPROFEN 600 MG PO TABS
600.0000 mg | ORAL_TABLET | Freq: Four times a day (QID) | ORAL | Status: DC | PRN
Start: 2012-05-23 — End: 2012-09-01

## 2012-05-23 MED ORDER — OXYCODONE-ACETAMINOPHEN 5-325 MG PO TABS: 1.0000 | ORAL_TABLET | Freq: Four times a day (QID) | ORAL | Status: DC | PRN

## 2012-05-23 MED ORDER — OXYCODONE-ACETAMINOPHEN 5-325 MG PO TABS
2.0000 | ORAL_TABLET | Freq: Once | ORAL | Status: AC
  Administered 2012-05-23: 2 via ORAL
  Filled 2012-05-23: qty 2

## 2012-05-23 NOTE — MAU Provider Note (Signed)
CC: Abdominal Pain    First Provider Initiated Contact with Patient 05/23/12 0150      HPI Stacey [name] is a 21 y.o. G1P1001 who presents with onset of severe lower abdominal crampy pain throughout lower abdomen while at work today. She has been having similar pain intermittently for about a month. States her partner has been feeling the Mirena string during intercourse recently though he had not felt it initially. She's had it in about a year since after her delivery.  She's had spotting over the past couple of weeks and previously had been amenorrheic since shortly after insertion. Has mucousy vaginal discharge that is not irritative. No dysuria, frequency or urgency of urination, hematuria. Also having UR congestion and has a throbbing frontal headache that she thinks is a migraine.   Past Medical History  Diagnosis Date  . Migraine   . 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 Preeclampsia   . Pregnancy induced hypertension   . GERD (gastroesophageal reflux disease)     OB History    Grav Para Term Preterm Abortions TAB SAB Ect Mult Living   1 1 1  0 0 0 0 0 0 1     # Outc Date GA Lbr Len/2nd Wgt Sex Del Anes PTL Lv   1 TRM 5/12    F SVD   Yes   Comments: post partum pre eclampsia      Past Surgical History  Procedure Date  . No past surgeries     History   Social History  . Marital Status: Single    Spouse Name: N/A    Number of Children: N/A  . Years of Education: N/A   Occupational History  . Not on file.   Social History Main Topics  . Smoking status: Current Every Day Smoker -- 0.2 packs/day    Types: Cigarettes, Cigars  . Smokeless tobacco: Never Used  . Alcohol Use: No  . Drug Use: No  . Sexually Active: Yes    Birth Control/ Protection: IUD     Comment: pt reports she has had vaginal bleeding daily since having IUD placed in August 2012   Other Topics Concern  . Not on file   Social History Narrative  . No narrative on file    No current  facility-administered medications on file prior to encounter.   Current Outpatient Prescriptions on File Prior to Encounter  Medication Sig Dispense Refill  . HYDROcodone-acetaminophen (NORCO/VICODIN) 5-325 MG per tablet Take 1 tablet by mouth every 4 (four) hours as needed for pain.  20 tablet  0  . ibuprofen (ADVIL,MOTRIN) 800 MG tablet Take 1 tablet (800 mg total) by mouth 3 (three) times daily.  12 tablet  0    No Known Allergies  ROS Pertinent items in HPI  PHYSICAL EXAM Filed Vitals:   05/23/12 0052  BP: 117/66  Pulse: 103  Temp: 98 F (36.7 C)  Resp: 18   General: Well nourished, well developed female in no acute distress. Congested Cardiovascular: Normal rate Respiratory: Normal effort Abdomen: Soft, tender suprapubically, no rebound or guarding Back: No CVAT Extremities: No edema Neurologic: Alert and oriented Speculum exam: NEFG; vagina with physiologic mucusy discharge, no blood; cervix clean. Mirena strings protruding 4-5 cm into vagina; on Q-tip exam IUD noted to be in the cervical canal with hub of device almost flush with the external os.   LAB RESULTS Results for orders placed during the hospital encounter of 05/23/12 (from  the past 24 hour(s))  URINALYSIS, ROUTINE W REFLEX MICROSCOPIC     Status: Abnormal   Collection Time   05/23/12 12:55 AM      Component Value Range   Color, Urine YELLOW  YELLOW   APPearance CLEAR  CLEAR   Specific Gravity, Urine >1.030 (*) 1.005 - 1.030   pH 5.5  5.0 - 8.0   Glucose, UA NEGATIVE  NEGATIVE mg/dL   Hgb urine dipstick TRACE (*) NEGATIVE   Bilirubin Urine NEGATIVE  NEGATIVE   Ketones, ur NEGATIVE  NEGATIVE mg/dL   Protein, ur NEGATIVE  NEGATIVE mg/dL   Urobilinogen, UA 0.2  0.0 - 1.0 mg/dL   Nitrite NEGATIVE  NEGATIVE   Leukocytes, UA TRACE (*) NEGATIVE  URINE MICROSCOPIC-ADD ON     Status: Normal   Collection Time   05/23/12 12:55 AM      Component Value Range   Squamous Epithelial / LPF RARE  RARE   WBC, UA 0-2   <3 WBC/hpf   RBC / HPF 0-2  <3 RBC/hpf   Bacteria, UA RARE  RARE   Urine-Other MUCOUS PRESENT    POCT PREGNANCY, URINE     Status: Normal   Collection Time   05/23/12  1:08 AM      Component Value Range   Preg Test, Ur NEGATIVE  NEGATIVE   MAU COURSE IUD Removal   Cervix was swabbed with Betadine. The strings of the IUD were grasped and pulled using Kelly clamp. The IUD was easily removed in its entirety. Pt. tolerated the procedure well. GC/CT sent Percocet with relief of headache and cramps  ASSESSMENT  1. Headache   2. IUD complication   3. Abdominal pain   4. Acute URI    IUD partially expelled and removed  PLAN Discharge home. F/U [facility] asap. See AVS for patient education.     Medication List     As of 05/23/2012  2:29 AM    TAKE these medications         HYDROcodone-acetaminophen 5-325 MG per tablet   Commonly known as: NORCO/VICODIN   Take 1 tablet by mouth every 4 (four) hours as needed for pain.      ibuprofen 800 MG tablet   Commonly known as: ADVIL,MOTRIN   Take 1 tablet (800 mg total) by mouth 3 (three) times daily.      ibuprofen 600 MG tablet   Commonly known as: ADVIL,MOTRIN   Take 1 tablet (600 mg total) by mouth every 6 (six) hours as needed for pain.           Danae Orleans, CNM 05/23/2012 1:52 AM

## 2012-05-23 NOTE — MAU Note (Signed)
Pt states "my stomach is killing me", reports lower abd pain x 1 month, but worsening tonight. Has Mirena and thinks this is the reason for the pain.

## 2012-05-23 NOTE — Progress Notes (Signed)
Pt states she has had a headache all day and has felt dizzy

## 2012-05-23 NOTE — Progress Notes (Signed)
Pt states she has had some vaginal bleeding , with some small clots.Pt states discharge has also been watery

## 2012-05-23 NOTE — MAU Note (Signed)
Pt states she has been feeling pain on and off for about a month. Pt states she has been having difficulty walking and questions whether it is the [facility]

## 2012-05-28 ENCOUNTER — Telehealth (HOSPITAL_COMMUNITY): Payer: Self-pay | Admitting: Obstetrics and Gynecology

## 2012-05-28 ENCOUNTER — Telehealth: Payer: Self-pay | Admitting: *Deleted

## 2012-05-28 DIAGNOSIS — A749 Chlamydial infection, unspecified: Secondary | ICD-10-CM

## 2012-05-28 MED ORDER — AZITHROMYCIN 250 MG PO TABS: 500.0000 mg | ORAL_TABLET | Freq: Once | ORAL | Status: DC

## 2012-05-28 NOTE — Telephone Encounter (Signed)
Message copied by Mannie Stabile on Wed May 28, 2012 11:50 AM ------      Message from: POE, DEIRDRE C      Created: Tue May 27, 2012 12:41 PM       Zithromax 1 gm po for pos CT

## 2012-05-28 NOTE — Telephone Encounter (Signed)
Pt informed. Rx sent to pharmacy. Instructed patient that her partner also should be treated. Pt voiced understanding.

## 2012-05-28 NOTE — Telephone Encounter (Signed)
Telephone call to patient regarding positive chlamydia culture, patient notified.  Instructed patient to schedule treatment with [facility] clinic at [phone].  Instructed patient to notify her partner for treatment.  Report faxed to health department.

## 2012-09-01 ENCOUNTER — Emergency Department (HOSPITAL_COMMUNITY)
Admission: EM | Admit: 2012-09-01 | Discharge: 2012-09-01 | Disposition: A | Discharge status: Home / Self Care | Payer: Self-pay | Attending: Emergency Medicine | Admitting: Emergency Medicine

## 2012-09-01 ENCOUNTER — Encounter (HOSPITAL_COMMUNITY): Payer: Self-pay | Admitting: Emergency Medicine

## 2012-09-01 DIAGNOSIS — IMO0001 Reserved for inherently not codable concepts without codable children: Secondary | ICD-10-CM | POA: Insufficient documentation

## 2012-09-01 DIAGNOSIS — J029 Acute pharyngitis, unspecified: Secondary | ICD-10-CM | POA: Insufficient documentation

## 2012-09-01 DIAGNOSIS — B9789 Other viral agents as the cause of diseases classified elsewhere: Secondary | ICD-10-CM | POA: Insufficient documentation

## 2012-09-01 DIAGNOSIS — H5789 Other specified disorders of eye and adnexa: Secondary | ICD-10-CM | POA: Insufficient documentation

## 2012-09-01 DIAGNOSIS — M542 Cervicalgia: Secondary | ICD-10-CM | POA: Insufficient documentation

## 2012-09-01 DIAGNOSIS — Z8742 Personal history of other diseases of the female genital tract: Secondary | ICD-10-CM | POA: Insufficient documentation

## 2012-09-01 DIAGNOSIS — R51 Headache: Secondary | ICD-10-CM | POA: Insufficient documentation

## 2012-09-01 DIAGNOSIS — Z8679 Personal history of other diseases of the circulatory system: Secondary | ICD-10-CM | POA: Insufficient documentation

## 2012-09-01 DIAGNOSIS — F17200 Nicotine dependence, unspecified, uncomplicated: Secondary | ICD-10-CM | POA: Insufficient documentation

## 2012-09-01 DIAGNOSIS — K219 Gastro-esophageal reflux disease without esophagitis: Secondary | ICD-10-CM | POA: Insufficient documentation

## 2012-09-01 DIAGNOSIS — Z8619 Personal history of other infectious and parasitic diseases: Secondary | ICD-10-CM | POA: Insufficient documentation

## 2012-09-01 DIAGNOSIS — M549 Dorsalgia, unspecified: Secondary | ICD-10-CM | POA: Insufficient documentation

## 2012-09-01 LAB — RAPID STREP SCREEN (MED CTR MEBANE ONLY): Streptococcus, Group A Screen (Direct): NEGATIVE

## 2012-09-01 MED ORDER — IBUPROFEN 600 MG PO TABS: 600.0000 mg | ORAL_TABLET | Freq: Three times a day (TID) | ORAL | Status: DC | PRN

## 2012-09-01 MED ORDER — IBUPROFEN 400 MG PO TABS
600.0000 mg | ORAL_TABLET | Freq: Once | ORAL | Status: AC
  Administered 2012-09-01: 600 mg via ORAL
  Filled 2012-09-01: qty 1

## 2012-09-01 NOTE — ED Notes (Signed)
Pt drinking water 

## 2012-09-01 NOTE — ED Notes (Signed)
Pt states she is able to keep fluids down without feeling nauseous; pt alert and mentating appropriately upon d/c teaching; pt given d/c teaching and prescriptions; pt verbalizes understanding of d/c teaching and has no further questions; pt verbalizes understanding of follow up care and at home pain management instructions. Pt ambulatory leaving ED

## 2012-09-01 NOTE — ED Provider Notes (Signed)
History  This chart was scribed for Suzi Roots, MD by Bennett Scrape, ED Scribe. This patient was seen in room TR05C/TR05C and the patient's care was started at 6:39 PM.  CSN: [id number]  Arrival date & time 09/01/12  1634   First MD Initiated Contact with Patient 09/01/12 1839      Chief Complaint  Patient presents with  . Sore Throat  . Back Pain  . Headache     The history is provided by the patient. No language interpreter was used.    Stacey [name] is a 22 y.o. female who presents to the Emergency Department complaining of 4 days of gradual onset, gradually worsening, constant bilateral, lateral neck pain that radiates around bilaterally, into the upper back and up into the occipital region. She states that she first noticed a swollen lymph node and then had a sore throat and gradual onset mild HA. She states that the sore throat and HA have since improved. She reports prior episodes of strep throat with similar symptoms. She denies any h/o sickle cell or chronic illness. No hx chronic neck pain. She denies fever, otalgia, rash and trouble swallowing as associated symptoms. No cough or sob. No recent trauma, strain or fall. She has a h/o GERD and is a current everyday smoker but denies alcohol use. No fever or chills. No known ill contacts.     Past Medical History  Diagnosis Date  . Migraine   . 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 Preeclampsia   . Pregnancy induced hypertension   . GERD (gastroesophageal reflux disease)     Past Surgical History  Procedure Laterality Date  . No past surgeries      Family History  Problem Relation Age of Onset  . Anesthesia problems Neg Hx   . Hypertension Mother   . Asthma Brother     History  Substance Use Topics  . Smoking status: Current Every Day Smoker -- 0.25 packs/day    Types: Cigarettes, Cigars  . Smokeless tobacco: Never Used  . Alcohol Use: No    OB History   Grav Para Term Preterm Abortions TAB SAB  Ect Mult Living   1 1 1  0 0 0 0 0 0 1      Review of Systems  Constitutional: Negative for fever and chills.  HENT: Positive for sore throat and neck pain. Negative for ear pain, congestion and trouble swallowing.        Sore throat improved.   Eyes: Positive for redness. Negative for pain, discharge and visual disturbance.  Respiratory: Negative for cough and shortness of breath.   Gastrointestinal: Negative for vomiting and diarrhea.  Genitourinary: Negative for flank pain.  Musculoskeletal: Positive for myalgias.  Skin: Negative for rash.  Neurological: Negative for weakness, numbness and headaches.       Prior mild headache, resolved.     Allergies  Review of patient's allergies indicates no known allergies.  Home Medications   Current Outpatient Rx  Name  Route  Sig  Dispense  Refill  . Aspirin-Acetaminophen-Caffeine (GOODY HEADACHE PO)   Oral   Take 1 packet by mouth daily as needed (headache).         . Phenylephrine-Acetaminophen (EXCEDRIN SINUS HEADACHE PO)   Oral   Take 1 tablet by mouth every 6 (six) hours as needed (headache).           Triage Vitals: BP 110/65  Pulse 104  Temp(Src) 98 F (  36.7 C) (Oral)  Resp 18  SpO2 100%  Physical Exam  Nursing note and vitals reviewed. Constitutional: She is oriented to person, place, and time. She appears well-developed and well-nourished. No distress.  HENT:  Head: Normocephalic and atraumatic.  Tonsils are mildly enlarged and erythematous, no asymmetric swelling or abscess. no exudate. No trismus.   Eyes: Conjunctivae and EOM are normal. Pupils are equal, round, and reactive to light. Right eye exhibits no discharge. Left eye exhibits no discharge.  Neck: Normal range of motion. Neck supple. No tracheal deviation present. No thyromegaly present.  No meningismus signs.  +paraspinal cervical muscle tenderness and bil trapezius muscular tenderness, no cervical spine tenderness, no stiffness or nuchal rigidity.   CT spine, non tender, aligned, no step off.   Cardiovascular: Normal rate, regular rhythm, normal heart sounds and intact distal pulses.   Pulmonary/Chest: Effort normal and breath sounds normal. No stridor. No respiratory distress.  Abdominal: Soft. She exhibits no distension. There is no tenderness.  No hsm.   Genitourinary:  No cva tenderness  Musculoskeletal: Normal range of motion. She exhibits no edema and no tenderness.  Thoracic paraspinal tenderness, no thoracic spine tenderness  Lymphadenopathy:    She has no cervical adenopathy.  Neurological: She is alert and oriented to person, place, and time.  Motor intact bil. Steady gait.   Skin: Skin is warm and dry.  Psychiatric: She has a normal mood and affect. Her behavior is normal.    ED Course  Procedures (including critical care time)  DIAGNOSTIC STUDIES: Oxygen Saturation is 100% on RA, normal by my interpretation.    COORDINATION OF CARE: 6:48 PM-Informed pt of lab work results. Advised pt that her symptoms will resolve on their own. Discussed discharge plan which includes rest and fluids with pt and pt agreed to plan.   Results for orders placed during the hospital encounter of 09/01/12  RAPID STREP SCREEN      Result Value Range   Streptococcus, Group A Screen (Direct) NEGATIVE  NEGATIVE       MDM  I personally performed the services described in this documentation, which was scribed in my presence. The recorded information has been reviewed and is accurate.  Strep negative. Sore throat improving. Myalgias persist.   Spine nt. No neck stiffness or rigidity. bil trap m tenderness.  Motrin po.  Pt stable for d/c.     Suzi Roots, MD 09/01/12 [phone]

## 2012-09-01 NOTE — ED Notes (Signed)
Pt c/o headache and sore throat with pain in back x 4 days

## 2012-09-02 ENCOUNTER — Inpatient Hospital Stay (HOSPITAL_COMMUNITY)
Admission: AD | Admit: 2012-09-02 | Discharge: 2012-09-03 | Disposition: A | Discharge status: Home / Self Care | Payer: Self-pay | Source: Ambulatory Visit | Attending: Obstetrics & Gynecology | Admitting: Obstetrics & Gynecology

## 2012-09-02 ENCOUNTER — Encounter (HOSPITAL_COMMUNITY): Payer: Self-pay | Admitting: *Deleted

## 2012-09-02 DIAGNOSIS — N760 Acute vaginitis: Secondary | ICD-10-CM

## 2012-09-02 DIAGNOSIS — N949 Unspecified condition associated with female genital organs and menstrual cycle: Secondary | ICD-10-CM | POA: Insufficient documentation

## 2012-09-02 DIAGNOSIS — O2390 Unspecified genitourinary tract infection in pregnancy, unspecified trimester: Secondary | ICD-10-CM | POA: Insufficient documentation

## 2012-09-02 DIAGNOSIS — B9689 Other specified bacterial agents as the cause of diseases classified elsewhere: Secondary | ICD-10-CM

## 2012-09-02 NOTE — MAU Note (Addendum)
PT SAYS SHE STARTED CRAMPING  ON Sunday. STARTED HAVING VAG D/C  ON 3-11.   DID HOME PREG TEST LAST NIGHT  AND TODAY- WERE POSTIVE.   Marland Kitchen LMP- 2-11.   SHE PLANS TO GO TO  [place]  FOR  [facility]

## 2012-09-03 ENCOUNTER — Encounter (HOSPITAL_COMMUNITY): Payer: Self-pay | Admitting: *Deleted

## 2012-09-03 LAB — POCT PREGNANCY, URINE: Preg Test, Ur: POSITIVE — AB

## 2012-09-03 LAB — GC/CHLAMYDIA PROBE AMP: GC Probe RNA: NEGATIVE

## 2012-09-03 MED ORDER — METRONIDAZOLE 500 MG PO TABS: 500.0000 mg | ORAL_TABLET | Freq: Two times a day (BID) | ORAL | Status: DC

## 2012-09-03 NOTE — MAU Provider Note (Signed)
Attestation of Attending Supervision of Advanced Practitioner (PA/CNM/NP): Evaluation and management procedures were performed by the Advanced Practitioner under my supervision and collaboration.  I have reviewed the Advanced Practitioner's note and chart, and I agree with the management and plan.  Leilana Mcquire, MD, FACOG Attending Obstetrician & Gynecologist Faculty Practice, [facility]  

## 2012-09-03 NOTE — MAU Provider Note (Signed)
History     CSN: [id number]  Arrival date and time: 09/02/12 2308   First Provider Initiated Contact with Patient 09/03/12 0121      No chief complaint on file.  HPI [name] is a 22 y.o. G2P1001 at [redacted]w[redacted]d who presents to MAU today for confirmation of pregnancy and vaginal discharge. The patient states LMP was 07/29/12. She had a +HPT today and wanted confirmation letter to start process for Medicaid and prenatal care. She is also complaining of a thin white discharge. She denies vaginal itching or burning. She denies vaginal bleeding. She has mild lower abdominal discomfort that she rates at 3-4/10. She has not taken anything for the discomfort.   OB History   Grav Para Term Preterm Abortions TAB SAB Ect Mult Living   2 1 1  0 0 0 0 0 0 1      Past Medical History  Diagnosis Date  . Migraine   . 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 Preeclampsia   . Pregnancy induced hypertension   . GERD (gastroesophageal reflux disease)     Past Surgical History  Procedure Laterality Date  . No past surgeries      Family History  Problem Relation Age of Onset  . Anesthesia problems Neg Hx   . Hypertension Mother   . Asthma Brother     History  Substance Use Topics  . Smoking status: Current Every Day Smoker -- 0.25 packs/day    Types: Cigarettes, Cigars  . Smokeless tobacco: Never Used  . Alcohol Use: No    Allergies: No Known Allergies  Prescriptions prior to admission  Medication Sig Dispense Refill  . Aspirin-Acetaminophen-Caffeine (GOODY HEADACHE PO) Take 1 packet by mouth daily as needed (headache).      Marland Kitchen ibuprofen (ADVIL,MOTRIN) 600 MG tablet Take 1 tablet (600 mg total) by mouth every 8 (eight) hours as needed for pain. Take with food.  20 tablet  0  . Phenylephrine-Acetaminophen (EXCEDRIN SINUS HEADACHE PO) Take 1 tablet by mouth every 6 (six) hours as needed (headache).        Review of Systems  Constitutional: Negative for fever and malaise/fatigue.   Gastrointestinal: Positive for abdominal pain. Negative for nausea, vomiting, diarrhea and constipation.  Genitourinary: Negative for dysuria, urgency and frequency.       Neg - vaginal bleeding + vaginal discharge  Neurological: Negative for dizziness and headaches.   Physical Exam   Blood pressure 107/61, pulse 115, temperature 98.9 F (37.2 C), temperature source Oral, resp. rate 20, height 5\' 1"  (1.549 m), weight 148 lb 2 oz (67.189 kg), last menstrual period 07/29/2012, not currently breastfeeding.  Physical Exam  Constitutional: She is oriented to person, place, and time. She appears well-developed and well-nourished. No distress.  HENT:  Head: Normocephalic and atraumatic.  Cardiovascular: Normal rate, regular rhythm and normal heart sounds.   Respiratory: Effort normal and breath sounds normal. No respiratory distress.  GI: Soft. Bowel sounds are normal. She exhibits no distension and no mass. There is no tenderness. There is no rebound and no guarding.  Genitourinary: Vagina normal. Uterus is not enlarged and not tender. Cervix exhibits discharge (moderate amount of thick white discharge noted at the cervical os and in the vagina). Cervix exhibits no motion tenderness and no friability. Right adnexum displays no mass and no tenderness. Left adnexum displays no mass and no tenderness.  Neurological: She is alert and oriented to person, place, and time.  Skin:  Skin is warm and dry. No erythema.  Psychiatric: She has a normal mood and affect.   Results for orders placed during the hospital encounter of 09/02/12 (from the past 24 hour(s))  POCT PREGNANCY, URINE     Status: Abnormal   Collection Time    09/03/12 12:11 AM      Result Value Range   Preg Test, Ur POSITIVE (*) NEGATIVE  WET PREP, GENITAL     Status: Abnormal   Collection Time    09/03/12  1:25 AM      Result Value Range   Yeast Wet Prep HPF POC NONE SEEN  NONE SEEN   Trich, Wet Prep NONE SEEN  NONE SEEN   Clue  Cells Wet Prep HPF POC FEW (*) NONE SEEN   WBC, Wet Prep HPF POC MODERATE (*) NONE SEEN    MAU Course  Procedures None  MDM Patient is complaining of very mild discomfort and has no tenderness on exam. She also denies vaginal bleeding. Vaginal discharge is consistent with BV.   Assessment and Plan  A: Bacterial Vaginosis  P: Discharge home Ectopic precautions discussed Rx for Flagyl sent to patient's pharmacy Discussed hygiene products and probiotics for avoiding recurrence Pregnancy confirmation letter given List of area OB providers given. Patient unsure of where she would like to go for prenatal care.  Patient may return to MAU as needed or if her condition should change or worsen  Freddi Starr, PA-C  09/03/2012, 1:22 AM

## 2012-10-25 ENCOUNTER — Emergency Department (HOSPITAL_COMMUNITY)
Admission: EM | Admit: 2012-10-25 | Discharge: 2012-10-25 | Disposition: A | Discharge status: Home / Self Care | Payer: Self-pay | Attending: Emergency Medicine | Admitting: Emergency Medicine

## 2012-10-25 ENCOUNTER — Encounter (HOSPITAL_COMMUNITY): Payer: Self-pay | Admitting: Physical Medicine and Rehabilitation

## 2012-10-25 DIAGNOSIS — Z8619 Personal history of other infectious and parasitic diseases: Secondary | ICD-10-CM | POA: Insufficient documentation

## 2012-10-25 DIAGNOSIS — Z8679 Personal history of other diseases of the circulatory system: Secondary | ICD-10-CM | POA: Insufficient documentation

## 2012-10-25 DIAGNOSIS — R509 Fever, unspecified: Secondary | ICD-10-CM | POA: Insufficient documentation

## 2012-10-25 DIAGNOSIS — F17200 Nicotine dependence, unspecified, uncomplicated: Secondary | ICD-10-CM | POA: Insufficient documentation

## 2012-10-25 DIAGNOSIS — Z8719 Personal history of other diseases of the digestive system: Secondary | ICD-10-CM | POA: Insufficient documentation

## 2012-10-25 DIAGNOSIS — J020 Streptococcal pharyngitis: Secondary | ICD-10-CM | POA: Insufficient documentation

## 2012-10-25 LAB — RAPID STREP SCREEN (MED CTR MEBANE ONLY): Streptococcus, Group A Screen (Direct): NEGATIVE

## 2012-10-25 MED ORDER — AMOXICILLIN 500 MG PO CAPS: 500.0000 mg | ORAL_CAPSULE | Freq: Three times a day (TID) | ORAL | Status: DC

## 2012-10-25 MED ORDER — ACETAMINOPHEN 325 MG PO TABS
650.0000 mg | ORAL_TABLET | Freq: Once | ORAL | Status: AC
  Administered 2012-10-25: 650 mg via ORAL
  Filled 2012-10-25: qty 2

## 2012-10-25 MED ORDER — METRONIDAZOLE 500 MG PO TABS: 500.0000 mg | ORAL_TABLET | Freq: Two times a day (BID) | ORAL | Status: DC

## 2012-10-25 NOTE — ED Provider Notes (Signed)
History    This chart was scribed for Stacey [name] (PA) non-physician practitioner working with Stacey Cooper III, MD by Stacey [name], ED Scribe. This patient was seen in room TR06C/TR06C and the patient's care was started at 6:57PM.   CSN: [id number]  Arrival date & time 10/25/12  1728   First MD Initiated Contact with Patient 10/25/12 1857      Chief Complaint  Patient presents with  . Sore Throat  . Fever    (Consider location/radiation/quality/duration/timing/severity/associated sxs/prior treatment) Patient is a 22 y.o. female presenting with fever and pharyngitis. The history is provided by the patient. No language interpreter was used.  Fever Temp source:  Subjective Severity:  Moderate Onset quality:  Sudden Duration:  3 days Timing:  Constant Progression:  Worsening Chronicity:  New Relieved by:  Nothing Worsened by:  Nothing tried Ineffective treatments:  None tried Associated symptoms: sore throat   Associated symptoms: no chest pain, no diarrhea, no dysuria, no headaches, no nausea, no rash and no vomiting   Sore throat:    Severity:  Moderate   Onset quality:  Sudden   Duration:  3 days   Timing:  Constant   Progression:  Worsening Sore Throat This is a new problem. The current episode started more than 2 days ago (3 days ago). The problem occurs constantly. The problem has been gradually worsening. Pertinent negatives include no chest pain, no headaches and no shortness of breath. Nothing aggravates the symptoms. Nothing relieves the symptoms. She has tried nothing for the symptoms. The treatment provided no relief.    Stacey [name] is a 22 y.o. female , with a hx of migraine, HSV-2 infection, and GERD, who presents to the Emergency Department complaining of sudden, progressively worsening, fever (subjective PTA, 101, taken at [facility] this evening, 10/25/12), onset three days ago (10/22/12)  Associated symptoms include sore throat and difficulty swallowing. The pt reports  she has been experiencing a fever and sore throat, accompanied by a decreased appetite for the past three days, which has prompted her concern and desire to seek medical evaluation at [facility] this evening (10/25/12). The pt has not taken any medications PTA.  The pt denies vomiting, sob, chest pain, rhinorrhea, cough, abdominal pain, and otalgia. Furthermore, the pt denies any allergies to medications.   The pt is a current everyday smoker, however, she does not drink alcohol.   Pt does not have a PCP.    Past Medical History  Diagnosis Date  . Migraine   . 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 GERD (gastroesophageal reflux disease)     Past Surgical History  Procedure Laterality Date  . No past surgeries      Family History  Problem Relation Age of Onset  . Anesthesia problems Neg Hx   . Hypertension Mother   . Asthma Brother     History  Substance Use Topics  . Smoking status: Current Every Day Smoker -- 0.25 packs/day    Types: Cigarettes, Cigars  . Smokeless tobacco: Never Used  . Alcohol Use: No    OB History   Grav Para Term Preterm Abortions TAB SAB Ect Mult Living   2 1 1  0 0 0 0 0 0 1      Review of Systems  Constitutional: Positive for fever. Negative for diaphoresis.  HENT: Positive for sore throat. Negative for neck pain and neck stiffness.   Eyes: Negative for visual disturbance.  Respiratory: Negative for apnea, chest  tightness and shortness of breath.   Cardiovascular: Negative for chest pain and palpitations.  Gastrointestinal: Negative for nausea, vomiting, diarrhea and constipation.  Genitourinary: Negative for dysuria.  Musculoskeletal: Negative for gait problem.  Skin: Negative for rash.  Neurological: Negative for dizziness, weakness, light-headedness, numbness and headaches.    Allergies  Review of patient's allergies indicates no known allergies.  Home Medications  No current outpatient prescriptions on file.  BP 119/62  Pulse 120   Temp(Src) 101 F (38.3 C) (Oral)  Resp 18  SpO2 98%  LMP 07/29/2012  Physical Exam  Nursing note and vitals reviewed. Constitutional: She is oriented to person, place, and time. She appears well-developed and well-nourished. No distress.  HENT:  Head: Normocephalic and atraumatic. No trismus in the jaw.  Right Ear: Tympanic membrane normal.  Left Ear: Tympanic membrane normal.  Mouth/Throat: Oropharyngeal exudate, posterior oropharyngeal edema and posterior oropharyngeal erythema present. No tonsillar abscesses.    White exudate at bilateral tonsils.  Eyes: Conjunctivae and EOM are normal.  Neck: Normal range of motion. Neck supple.  No meningeal signs  Cardiovascular: Normal rate, regular rhythm and normal heart sounds.  Exam reveals no gallop and no friction rub.   No murmur heard. Pulmonary/Chest: Effort normal and breath sounds normal. No respiratory distress. She has no wheezes. She has no rales. She exhibits no tenderness.  Abdominal: Soft. Bowel sounds are normal. She exhibits no distension. There is no tenderness. There is no rebound and no guarding.  Musculoskeletal: Normal range of motion. She exhibits no edema and no tenderness.  Neurological: She is alert and oriented to person, place, and time. No cranial nerve deficit.  Skin: Skin is warm and dry. She is not diaphoretic. No erythema.    ED Course  Procedures (including critical care time)  DIAGNOSTIC STUDIES: Oxygen Saturation is 98% on room air, normal by my interpretation.    COORDINATION OF CARE:   7:17 PM- Treatment plan discussed with patient. Pt agrees with treatment.     Results for orders placed during the hospital encounter of 10/25/12  RAPID STREP SCREEN      Result Value Range   Streptococcus, Group A Screen (Direct) NEGATIVE  NEGATIVE      No results found.   1. Strep throat       MDM  Pt febrile with tonsillar exudate, cervical lymphadenopathy, & dysphagia; clinical diagnosis of  strep even though rapid strep was negaive. Discussed importance of water rehydration. Presentation non concerning for PTA or infxn spread to soft tissue. No trismus or uvula deviation. Specific return precautions discussed. Pt able to drink water in ED without difficulty with intact air way. Recommended PCP follow up.    I personally performed the services described in this documentation, which was scribed in my presence. The recorded information has been reviewed and is accurate.     Stacey Nurse, PA-C 10/26/12 [phone]

## 2012-10-25 NOTE — ED Notes (Signed)
Pt presents to department for evaluation of sore throat, fever and generalized weakness. Ongoing x3 days. States difficulty swallowing. Respirations unlabored. Pt is alert and oriented x4.

## 2012-10-27 NOTE — ED Provider Notes (Signed)
Medical screening examination/treatment/procedure(s) were performed by non-physician practitioner and as supervising physician I was immediately available for consultation/collaboration.   [name], MD 10/27/12 [phone]

## 2013-02-24 ENCOUNTER — Encounter (HOSPITAL_COMMUNITY): Payer: Self-pay

## 2013-02-24 ENCOUNTER — Inpatient Hospital Stay (HOSPITAL_COMMUNITY)
Admission: AD | Admit: 2013-02-24 | Discharge: 2013-02-24 | Disposition: A | Discharge status: Home / Self Care | Payer: Medicaid Other | Source: Ambulatory Visit | Attending: Obstetrics & Gynecology | Admitting: Obstetrics & Gynecology

## 2013-02-24 DIAGNOSIS — IMO0001 Reserved for inherently not codable concepts without codable children: Secondary | ICD-10-CM | POA: Insufficient documentation

## 2013-02-24 DIAGNOSIS — M7918 Myalgia, other site: Secondary | ICD-10-CM

## 2013-02-24 DIAGNOSIS — M549 Dorsalgia, unspecified: Secondary | ICD-10-CM | POA: Insufficient documentation

## 2013-02-24 DIAGNOSIS — N949 Unspecified condition associated with female genital organs and menstrual cycle: Secondary | ICD-10-CM | POA: Insufficient documentation

## 2013-02-24 DIAGNOSIS — A5901 Trichomonal vulvovaginitis: Secondary | ICD-10-CM | POA: Insufficient documentation

## 2013-02-24 DIAGNOSIS — B3731 Acute candidiasis of vulva and vagina: Secondary | ICD-10-CM | POA: Insufficient documentation

## 2013-02-24 DIAGNOSIS — B373 Candidiasis of vulva and vagina: Secondary | ICD-10-CM

## 2013-02-24 LAB — URINALYSIS, ROUTINE W REFLEX MICROSCOPIC
Nitrite: NEGATIVE
Specific Gravity, Urine: >1.03 — ABNORMAL HIGH (ref 1.005–1.030)
Urobilinogen, UA: 0.2 mg/dL (ref 0.0–1.0)
pH: 6 (ref 5.0–8.0)

## 2013-02-24 LAB — CBC
HCT: 33.7 % — ABNORMAL LOW (ref 36.0–46.0)
MCV: 83 fL (ref 78.0–100.0)
RDW: 13.1 % (ref 11.5–15.5)
WBC: 7.5 10*3/uL (ref 4.0–10.5)

## 2013-02-24 LAB — WET PREP, GENITAL

## 2013-02-24 LAB — URINE MICROSCOPIC-ADD ON

## 2013-02-24 LAB — POCT PREGNANCY, URINE: Preg Test, Ur: NEGATIVE

## 2013-02-24 MED ORDER — FLUCONAZOLE 150 MG PO TABS
150.0000 mg | ORAL_TABLET | Freq: Once | ORAL | Status: AC
  Administered 2013-02-24: 150 mg via ORAL
  Filled 2013-02-24: qty 1

## 2013-02-24 MED ORDER — METRONIDAZOLE 500 MG PO TABS
2000.0000 mg | ORAL_TABLET | Freq: Once | ORAL | Status: AC
  Administered 2013-02-24: 2000 mg via ORAL
  Filled 2013-02-24: qty 4

## 2013-02-24 MED ORDER — TIZANIDINE HCL 4 MG PO TABS
4.0000 mg | ORAL_TABLET | Freq: Once | ORAL | Status: AC
  Administered 2013-02-24: 4 mg via ORAL
  Filled 2013-02-24: qty 1

## 2013-02-24 MED ORDER — IBUPROFEN 600 MG PO TABS
600.0000 mg | ORAL_TABLET | Freq: Once | ORAL | Status: AC
  Administered 2013-02-24: 600 mg via ORAL
  Filled 2013-02-24: qty 1

## 2013-02-24 MED ORDER — TIZANIDINE HCL 4 MG PO TABS: 4.0000 mg | ORAL_TABLET | Freq: Four times a day (QID) | ORAL | Status: DC | PRN

## 2013-02-24 MED ORDER — NYSTATIN-TRIAMCINOLONE 100000-0.1 UNIT/GM-% EX CREA
TOPICAL_CREAM | Freq: Once | CUTANEOUS | Status: AC
  Administered 2013-02-24: 1 via TOPICAL
  Filled 2013-02-24: qty 15

## 2013-02-24 MED ORDER — IBUPROFEN 600 MG PO TABS: 600.0000 mg | ORAL_TABLET | Freq: Four times a day (QID) | ORAL | Status: DC | PRN

## 2013-02-24 NOTE — MAU Provider Note (Signed)
History     CSN: [id number]  Arrival date and time: 02/24/13 0125   First Provider Initiated Contact with Patient 02/24/13 0205      Chief Complaint  Patient presents with  . Back Pain  . Vaginal Discharge   HPI *Back Pain -Onset Thursday 02/19/13.  -Upper and lower back pain that feels muscular in nature with occasional sharp shooting pains. -Worse with specific movements and twisting motions -Denies trauma  *Vaginal D/C -Onset 2 weeks, daily -Thin, clear to yellowish with slight odor, itchiness, and irritation. -Sexually active with 1 partner.    Past Medical History  Diagnosis Date  . Migraine   . 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 GERD (gastroesophageal reflux disease)     Past Surgical History  Procedure Laterality Date  . No past surgeries      Family History  Problem Relation Age of Onset  . Anesthesia problems Neg Hx   . Hypertension Mother   . Asthma Brother     History  Substance Use Topics  . Smoking status: Current Every Day Smoker -- 0.25 packs/day    Types: Cigarettes, Cigars  . Smokeless tobacco: Never Used  . Alcohol Use: No    Allergies: No Known Allergies  Prescriptions prior to admission  Medication Sig Dispense Refill  . acetaminophen (TYLENOL) 325 MG tablet Take 650 mg by mouth every 6 (six) hours as needed for pain.      . [DISCONTINUED] amoxicillin (AMOXIL) 500 MG capsule Take 1 capsule (500 mg total) by mouth 3 (three) times daily.  21 capsule  0  . [DISCONTINUED] metroNIDAZOLE (FLAGYL) 500 MG tablet Take 1 tablet (500 mg total) by mouth 2 (two) times daily.  14 tablet  0    Review of Systems  Constitutional: Negative for fever and diaphoresis.  Respiratory: Positive for shortness of breath (Worse with back pain).   Cardiovascular: Positive for chest pain (Bilateral, worse with deep inspiration and tends to be associated with back ache. Intermittent over past 4 days).  Gastrointestinal: Negative for nausea, vomiting,  diarrhea, constipation and blood in stool.  Genitourinary: Negative for dysuria, hematuria and flank pain.       Positive for vaginal d/c. Negative for vaginal bleeding.    Musculoskeletal: Positive for back pain (Upper and lower. Associated with sharp shooting pains.).   Physical Exam   Blood pressure 115/77, pulse 99, temperature 97.4 F (36.3 C), temperature source Oral, resp. rate 18, height 5' (1.524 m), weight 69.582 kg (153 lb 6.4 oz), last menstrual period 02/05/2013, SpO2 100.00%, unknown if currently breastfeeding.  Physical Exam  Constitutional: She appears well-developed and well-nourished. No distress.  Neck: Normal range of motion.  Cardiovascular: Normal rate, regular rhythm and normal heart sounds.  Exam reveals no gallop and no friction rub.   No murmur heard. Respiratory: Effort normal and breath sounds normal. No respiratory distress. She has no wheezes. She has no rales. She exhibits no tenderness.  GI: There is no tenderness (No Lower abdominal pain / adnexal tenderness). There is no rebound and no guarding.  Genitourinary: Cervix exhibits discharge (Yellow / white  thick d/c. Also clear d/c at cervical os). Cervix exhibits no motion tenderness and no friability. Right adnexum displays no mass, no tenderness and no fullness. Left adnexum displays no mass, no tenderness and no fullness. There is erythema (Mild) around the vagina. No bleeding around the vagina. No vaginal discharge found.  Musculoskeletal: She exhibits no edema and no tenderness (  No TTP. Negative CVA tenderness.).  Pain elicited with various movements (Arm extension). No deformity / inflammatory markings. Negative CVA Tenderness.   Skin: Skin is warm and dry. She is not diaphoretic.    MAU Course  Procedures  Results for orders placed during the hospital encounter of 02/24/13 (from the past 24 hour(s))  URINALYSIS, ROUTINE W REFLEX MICROSCOPIC     Status: Abnormal   Collection Time    02/24/13  1:30  AM      Result Value Range   Color, Urine YELLOW  YELLOW   APPearance CLEAR  CLEAR   Specific Gravity, Urine >1.030 (*) 1.005 - 1.030   pH 6.0  5.0 - 8.0   Glucose, UA NEGATIVE  NEGATIVE mg/dL   Hgb urine dipstick TRACE (*) NEGATIVE   Bilirubin Urine NEGATIVE  NEGATIVE   Ketones, ur 15 (*) NEGATIVE mg/dL   Protein, ur NEGATIVE  NEGATIVE mg/dL   Urobilinogen, UA 0.2  0.0 - 1.0 mg/dL   Nitrite NEGATIVE  NEGATIVE   Leukocytes, UA MODERATE (*) NEGATIVE  URINE MICROSCOPIC-ADD ON     Status: Abnormal   Collection Time    02/24/13  1:30 AM      Result Value Range   Squamous Epithelial / LPF MANY (*) RARE   WBC, UA 11-20  <3 WBC/hpf   Bacteria, UA MANY (*) RARE   Urine-Other MUCOUS PRESENT    POCT PREGNANCY, URINE     Status: None   Collection Time    02/24/13  1:41 AM      Result Value Range   Preg Test, Ur NEGATIVE  NEGATIVE  CBC     Status: Abnormal   Collection Time    02/24/13  2:50 AM      Result Value Range   WBC 7.5  4.0 - 10.5 K/uL   RBC 4.06  3.87 - 5.11 MIL/uL   Hemoglobin 11.4 (*) 12.0 - 15.0 g/dL   HCT 16.1 (*) 09.6 - 04.5 %   MCV 83.0  78.0 - 100.0 fL   MCH 28.1  26.0 - 34.0 pg   MCHC 33.8  30.0 - 36.0 g/dL   RDW 40.9  81.1 - 91.4 %   Platelets 323  150 - 400 K/uL  WET PREP, GENITAL     Status: Abnormal   Collection Time    02/24/13  2:50 AM      Result Value Range   Yeast Wet Prep HPF POC FEW (*) NONE SEEN   Trich, Wet Prep FEW (*) NONE SEEN   Clue Cells Wet Prep HPF POC FEW (*) NONE SEEN   WBC, Wet Prep HPF POC MANY (*) NONE SEEN    MDM *Back Pain: Worse with activity with associated radiating sharp pains points to MSK origin. Negative CVA tenderness and denial of dysuria / hematuria / flank pain. CP and SOB non-concerning due to 100% O2 Sat, no respiratory distress, or diaphoresis. Both CP and SOB are intermittent and not exacerbated by physical exertion.   *Vaginal D/C: +Trich, +yeast, + Clue Cells. GC / Chlamydia pending.  Assessment and Plan   1.  Vaginal yeast infection   2. Trichomonal vaginitis   3. Musculoskeletal pain       Medication List    STOP taking these medications       amoxicillin 500 MG capsule  Commonly known as:  AMOXIL     metroNIDAZOLE 500 MG tablet  Commonly known as:  FLAGYL      TAKE these  medications       acetaminophen 325 MG tablet  Commonly known as:  TYLENOL  Take 650 mg by mouth every 6 (six) hours as needed for pain.     ibuprofen 600 MG tablet  Commonly known as:  ADVIL,MOTRIN  Take 1 tablet (600 mg total) by mouth every 6 (six) hours as needed for pain.     tiZANidine 4 MG tablet  Commonly known as:  ZANAFLEX  Take 1-2 tablets (4-8 mg total) by mouth every 6 (six) hours as needed.        Follow-up Information   Follow up with [facility]. (As needed for routine care or as needed if symptoms worsen)    Contact information:   [address] [place] [address] [phone]      Follow up with [facility]. (As needed and emergencies)    Contact information:   [address] [id number] [place] [address] [phone]     -Do not have intercourse within 7 days from conclusion of patient and partner treatment for STI. -GC/Chlamydia pending.   Joellyn Rued 02/24/2013, 4:14 AM   I was present for the exam and agree with above. Suspect urine specimen was contaminated. Urine culture pending.  [place], CNM 02/24/2013 5:01 AM

## 2013-02-24 NOTE — MAU Note (Signed)
Pt c/o upper and lower back pain that began Friday. Also c/o vaginal d/c.

## 2013-02-25 LAB — URINE CULTURE

## 2013-02-26 LAB — GC/CHLAMYDIA PROBE AMP: GC Probe RNA: NEGATIVE

## 2013-04-30 ENCOUNTER — Encounter (HOSPITAL_COMMUNITY): Payer: Self-pay | Admitting: Emergency Medicine

## 2013-04-30 ENCOUNTER — Encounter (HOSPITAL_COMMUNITY): Payer: Self-pay | Admitting: *Deleted

## 2013-04-30 ENCOUNTER — Inpatient Hospital Stay (HOSPITAL_COMMUNITY): Payer: Medicaid Other

## 2013-04-30 ENCOUNTER — Emergency Department (HOSPITAL_COMMUNITY)
Admission: AD | Admit: 2013-04-30 | Discharge: 2013-04-30 | Disposition: A | Discharge status: Home / Self Care | Payer: Medicaid Other | Source: Ambulatory Visit | Attending: Obstetrics and Gynecology | Admitting: Obstetrics and Gynecology

## 2013-04-30 ENCOUNTER — Inpatient Hospital Stay (EMERGENCY_DEPARTMENT_HOSPITAL)
Admission: AD | Admit: 2013-04-30 | Discharge: 2013-04-30 | Disposition: A | Discharge status: Home / Self Care | Payer: Medicaid Other | Source: Ambulatory Visit | Attending: Obstetrics and Gynecology | Admitting: Obstetrics and Gynecology

## 2013-04-30 DIAGNOSIS — A5901 Trichomonal vulvovaginitis: Secondary | ICD-10-CM

## 2013-04-30 DIAGNOSIS — Z8679 Personal history of other diseases of the circulatory system: Secondary | ICD-10-CM | POA: Insufficient documentation

## 2013-04-30 DIAGNOSIS — R110 Nausea: Secondary | ICD-10-CM | POA: Insufficient documentation

## 2013-04-30 DIAGNOSIS — Z8719 Personal history of other diseases of the digestive system: Secondary | ICD-10-CM | POA: Insufficient documentation

## 2013-04-30 DIAGNOSIS — R109 Unspecified abdominal pain: Secondary | ICD-10-CM

## 2013-04-30 DIAGNOSIS — R1031 Right lower quadrant pain: Secondary | ICD-10-CM | POA: Insufficient documentation

## 2013-04-30 DIAGNOSIS — N898 Other specified noninflammatory disorders of vagina: Secondary | ICD-10-CM | POA: Insufficient documentation

## 2013-04-30 DIAGNOSIS — N83299 Other ovarian cyst, unspecified side: Secondary | ICD-10-CM

## 2013-04-30 DIAGNOSIS — Z8619 Personal history of other infectious and parasitic diseases: Secondary | ICD-10-CM | POA: Insufficient documentation

## 2013-04-30 DIAGNOSIS — F17200 Nicotine dependence, unspecified, uncomplicated: Secondary | ICD-10-CM | POA: Insufficient documentation

## 2013-04-30 LAB — CBC
MCH: 28 pg (ref 26.0–34.0)
MCHC: 34.2 g/dL (ref 30.0–36.0)
Platelets: 309 10*3/uL (ref 150–400)

## 2013-04-30 LAB — GC/CHLAMYDIA PROBE AMP: CT Probe RNA: NEGATIVE

## 2013-04-30 LAB — URINALYSIS, ROUTINE W REFLEX MICROSCOPIC
Ketones, ur: NEGATIVE mg/dL
Nitrite: NEGATIVE
Protein, ur: NEGATIVE mg/dL
pH: 7 (ref 5.0–8.0)

## 2013-04-30 LAB — URINE MICROSCOPIC-ADD ON

## 2013-04-30 MED ORDER — ONDANSETRON 4 MG PO TBDP
4.0000 mg | ORAL_TABLET | Freq: Once | ORAL | Status: AC
  Administered 2013-04-30: 4 mg via ORAL
  Filled 2013-04-30: qty 1

## 2013-04-30 MED ORDER — KETOROLAC TROMETHAMINE 60 MG/2ML IM SOLN
60.0000 mg | Freq: Once | INTRAMUSCULAR | Status: AC
  Administered 2013-04-30: 60 mg via INTRAMUSCULAR
  Filled 2013-04-30: qty 2

## 2013-04-30 MED ORDER — METRONIDAZOLE 500 MG PO TABS
2000.0000 mg | ORAL_TABLET | Freq: Once | ORAL | Status: AC
  Administered 2013-04-30: 2000 mg via ORAL

## 2013-04-30 MED ORDER — ONDANSETRON HCL 4 MG PO TABS
4.0000 mg | ORAL_TABLET | Freq: Once | ORAL | Status: AC
  Administered 2013-04-30: 4 mg via ORAL
  Filled 2013-04-30: qty 1

## 2013-04-30 MED ORDER — KETOROLAC TROMETHAMINE 10 MG PO TABS: 10.0000 mg | ORAL_TABLET | Freq: Four times a day (QID) | ORAL | Status: DC | PRN

## 2013-04-30 MED ORDER — HYDROMORPHONE HCL PF 1 MG/ML IJ SOLN
1.0000 mg | Freq: Once | INTRAMUSCULAR | Status: AC
  Administered 2013-04-30: 1 mg via INTRAMUSCULAR
  Filled 2013-04-30: qty 1

## 2013-04-30 NOTE — MAU Note (Signed)
Pt LMP early October, having RLQ pain today that became intense.  Denies bleeding.

## 2013-04-30 NOTE — ED Provider Notes (Signed)
  Medical screening examination/treatment/procedure(s) were performed by non-physician practitioner and as supervising physician I was immediately available for consultation/collaboration.     Maverick Dieudonne, MD 04/30/13 2336 

## 2013-04-30 NOTE — ED Notes (Signed)
Pt complains of abdominal pain for one week, was seen at [facility] last night, pt is on the cell phone answering my questions as she talks to that person

## 2013-04-30 NOTE — ED Provider Notes (Signed)
CSN: [id number]     Arrival date & time 04/30/13  2205 History   First MD Initiated Contact with Patient 04/30/13 2225     Chief Complaint  Patient presents with  . Abdominal Pain   (Consider location/radiation/quality/duration/timing/severity/associated sxs/prior Treatment) HPI Comments: Patient is a 22 y/o female with a PMHx of migraines, HSV-2, trichomonas vaginitis and GERD who presents to the ED complaining of right lower abdominal pain intermittently x 1-2 weeks. She was seen at [facility] last night, had a pelvic ultrasound showing small amount of fluid in cul-de-sac, no other acute findings, was told she had trichomonas and was treated with antibiotics. Also told she had ovarian cyst and tends to "stretch to cause pain" and she feels like it is "stretching" causing continued pain. She was sent home with prescription for pain medication, however does not get paid until tomorrow and could not get this filled. Pregnancy negative at [facility]. Admits to associated nausea when the pain comes on, nothing in specific makes the pain come or go. Pain relieved with pain meds at [facility] yesterday. Has small amount of vaginal discharge, unchanged from yesterday. Denies fever, chills, urinary changes, appetite change.  Patient is a 22 y.o. female presenting with abdominal pain. The history is provided by the patient.  Abdominal Pain Associated symptoms: nausea and vaginal discharge     Past Medical History  Diagnosis Date  . Migraine   . 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 GERD (gastroesophageal reflux disease)    Past Surgical History  Procedure Laterality Date  . No past surgeries     Family History  Problem Relation Age of Onset  . Anesthesia problems Neg Hx   . Hypertension Mother   . Asthma Brother    History  Substance Use Topics  . Smoking status: Current Every Day Smoker -- 0.25 packs/day    Types: Cigarettes, Cigars  . Smokeless tobacco: Never Used  . Alcohol Use:  No   OB History   Grav Para Term Preterm Abortions TAB SAB Ect Mult Living   2 1 1  0 1 1 0 0 0 1     Review of Systems  Gastrointestinal: Positive for nausea and abdominal pain.  Genitourinary: Positive for vaginal discharge.  All other systems reviewed and are negative.    Allergies  Review of patient's allergies indicates no known allergies.  Home Medications   Current Outpatient Rx  Name  Route  Sig  Dispense  Refill  . acetaminophen (TYLENOL) 325 MG tablet   Oral   Take 650 mg by mouth every 6 (six) hours as needed for pain.         Marland Kitchen ketorolac (TORADOL) 10 MG tablet   Oral   Take 1 tablet (10 mg total) by mouth every 6 (six) hours as needed.   20 tablet   0   . tiZANidine (ZANAFLEX) 4 MG tablet   Oral   Take 1-2 tablets (4-8 mg total) by mouth every 6 (six) hours as needed.   30 tablet   0    BP 118/59  Pulse 97  Temp(Src) 98.1 F (36.7 C) (Oral)  Resp 17  SpO2 100% Physical Exam  Nursing note and vitals reviewed. Constitutional: She is oriented to person, place, and time. She appears well-developed and well-nourished. No distress.  Texting on her cell phone throughout encounter.  HENT:  Head: Normocephalic and atraumatic.  Mouth/Throat: Oropharynx is clear and moist.  Eyes: Conjunctivae are normal.  Neck: Normal range of motion. Neck supple.  Cardiovascular: Normal rate, regular rhythm and normal heart sounds.   Pulmonary/Chest: Effort normal and breath sounds normal.  Abdominal: Soft. Normal appearance and bowel sounds are normal. She exhibits no distension and no mass. There is tenderness in the right lower quadrant. There is no rigidity, no rebound, no guarding and no tenderness at McBurney's point.    No peritoneal signs.  Musculoskeletal: Normal range of motion. She exhibits no edema.  Neurological: She is alert and oriented to person, place, and time.  Skin: Skin is warm and dry. She is not diaphoretic.  Psychiatric: She has a normal mood  and affect. Her behavior is normal.    ED Course  Procedures (including critical care time) Labs Review Labs Reviewed - No data to display Imaging Review US Transvaginal Non-ob  04/30/2013   CLINICAL DATA:  Right lower quadrant abdominal pain.  EXAM: TRANSABDOMINAL AND TRANSVAGINAL ULTRASOUND OF PELVIS  TECHNIQUE: Both transabdominal and transvaginal ultrasound examinations of the pelvis were performed. Transabdominal technique was performed for global imaging of the pelvis including uterus, ovaries, adnexal regions, and pelvic cul-de-sac. It was necessary to proceed with endovaginal exam following the transabdominal exam to visualize the uterus and ovaries in greater detail.  COMPARISON:  Pelvic ultrasound performed 11/11/2011  FINDINGS: Uterus  Measurements: 6.2 x 3.3 x 4.7 cm. No fibroids or other mass visualized.  Endometrium  Thickness: 1.0 cm in thickness.  No focal abnormality visualized.  Right ovary  Measurements: 3.4 x 2.5 x 2.3 cm. Normal appearance/no adnexal mass.  Left ovary  Measurements: 3.3 x 2.0 x 2.1 cm. Normal appearance/no adnexal mass.  Limited Doppler evaluation demonstrates normal color Doppler blood flow with respect to both ovaries; there is no evidence for ovarian torsion.  Other findings  Trace free fluid is seen within the pelvic cul-de-sac, likely physiologic in nature.  IMPRESSION: Unremarkable pelvic ultrasound.   Electronically Signed   By: Roanna Raider M.D.   On: 04/30/2013 01:49   US Pelvis Complete  04/30/2013   CLINICAL DATA:  Right lower quadrant abdominal pain.  EXAM: TRANSABDOMINAL AND TRANSVAGINAL ULTRASOUND OF PELVIS  TECHNIQUE: Both transabdominal and transvaginal ultrasound examinations of the pelvis were performed. Transabdominal technique was performed for global imaging of the pelvis including uterus, ovaries, adnexal regions, and pelvic cul-de-sac. It was necessary to proceed with endovaginal exam following the transabdominal exam to visualize the  uterus and ovaries in greater detail.  COMPARISON:  Pelvic ultrasound performed 11/11/2011  FINDINGS: Uterus  Measurements: 6.2 x 3.3 x 4.7 cm. No fibroids or other mass visualized.  Endometrium  Thickness: 1.0 cm in thickness.  No focal abnormality visualized.  Right ovary  Measurements: 3.4 x 2.5 x 2.3 cm. Normal appearance/no adnexal mass.  Left ovary  Measurements: 3.3 x 2.0 x 2.1 cm. Normal appearance/no adnexal mass.  Limited Doppler evaluation demonstrates normal color Doppler blood flow with respect to both ovaries; there is no evidence for ovarian torsion.  Other findings  Trace free fluid is seen within the pelvic cul-de-sac, likely physiologic in nature.  IMPRESSION: Unremarkable pelvic ultrasound.   Electronically Signed   By: Roanna Raider M.D.   On: 04/30/2013 01:49    EKG Interpretation   None       MDM   1. Abdominal pain     Patient with RLQ abdominal pain intermittent x 1-2 weeks. She is well appearing and in NAD, normal VS. Afebrile. Seen at [facility] yesterday, treated for trich. Tender in RLQ,  no tenderness at McBurney's point. She was unable to fill her pain medication today due to money which she will have tomorrow. Low suspicion for appendicitis as symptoms have been intermittent for almost 2 weeks, no fever, vomiting, appetite change. No peritoneal signs. No new symptoms. Plan to treat pain, she will fill pain medication tomorrow.  11:31 PM Patient reports great improvement of her pain from 8/10 to 2/10 after pain medication. Abdomen remains soft, clinical improvement of tenderness in RLQ. Tolerating PO. Return precautions given. Discussed symptoms of appendicitis. F/u with wellness clinic. Patient states understanding of treatment care plan and is agreeable.   Trevor Mace, PA-C 04/30/13 2334

## 2013-04-30 NOTE — MAU Note (Signed)
Pt returned from US

## 2013-04-30 NOTE — MAU Provider Note (Signed)
Chief Complaint: Abdominal Pain   First Provider Initiated Contact with Patient 04/30/13 0106     SUBJECTIVE HPI: Stacey [name] is a 22 y.o. G52P1011 female who presents with constant right lower quadrant pain x1 week. Worse tonight. 8/10 on pain scale. Has not tried anything for the pain. There are no aggravating or alleviating factors. Uncertain LMP, but thinks it was early October. Has not taken pregnancy test. States she normally has monthly periods. Has not had any had any evaluation  Past Medical History  Diagnosis Date  . Migraine   . 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 GERD (gastroesophageal reflux disease)    OB History  Gravida Para Term Preterm AB SAB TAB Ectopic Multiple Living  2 1 1  0 1 0 1 0 0 1    # Outcome Date GA Lbr Len/2nd Weight Sex Delivery Anes PTL Lv  2 TRM 11/02/10    F SVD   Y     Comments: post partum pre eclampsia  1 TAB              Comments: System Generated. Please review and update pregnancy details.     Past Surgical History  Procedure Laterality Date  . No past surgeries     History   Social History  . Marital Status: Single    Spouse Name: N/A    Number of Children: N/A  . Years of Education: N/A   Occupational History  . Not on file.   Social History Main Topics  . Smoking status: Current Every Day Smoker -- 0.25 packs/day    Types: Cigarettes, Cigars  . Smokeless tobacco: Never Used  . Alcohol Use: No  . Drug Use: No  . Sexual Activity: Yes    Birth Control/ Protection: None, Condom     Comment: pt reports she has had vaginal bleeding daily since having IUD placed in August 2012   Other Topics Concern  . Not on file   Social History Narrative  . No narrative on file   No current facility-administered medications on file prior to encounter.   Current Outpatient Prescriptions on File Prior to Encounter  Medication Sig Dispense Refill  . acetaminophen (TYLENOL) 325 MG tablet Take 650 mg by mouth every 6 (six) hours as  needed for pain.      Marland Kitchen tiZANidine (ZANAFLEX) 4 MG tablet Take 1-2 tablets (4-8 mg total) by mouth every 6 (six) hours as needed.  30 tablet  0   No Known Allergies  ROS: Pertinent items in HPI  OBJECTIVE Blood pressure 114/80, pulse 87, temperature 98.6 F (37 C), temperature source Oral, resp. rate 18, height 5' (1.524 m), weight 71.215 kg (157 lb). GENERAL: Well-developed, well-nourished female in no acute distress.  HEENT: Normocephalic HEART: normal rate RESP: normal effort ABDOMEN: Soft, moderate tenderness throughout entire right lower quadrant, but more so in the groin and 2 inches to the right of McBurney's point. Positive guarding. Negative mass or rebound. Positive bowel sounds x4. Negative CVA tenderness. EXTREMITIES: Nontender, no edema NEURO: Alert and oriented SPECULUM EXAM: NEFG, small amount of yellow-tinged, malodorous discharge, no blood noted, cervix clean BIMANUAL: cervix closed; uterus normal size. Mild right adnexal tenderness. No mass. No left adnexal tenderness or mass. No cervical motion tenderness.  LAB RESULTS Results for orders placed during the hospital encounter of 04/30/13 (from the past 24 hour(s))  URINALYSIS, ROUTINE W REFLEX MICROSCOPIC     Status: Abnormal   Collection Time  04/30/13 12:14 AM      Result Value Range   Color, Urine YELLOW  YELLOW   APPearance CLEAR  CLEAR   Specific Gravity, Urine 1.015  1.005 - 1.030   pH 7.0  5.0 - 8.0   Glucose, UA NEGATIVE  NEGATIVE mg/dL   Hgb urine dipstick NEGATIVE  NEGATIVE   Bilirubin Urine NEGATIVE  NEGATIVE   Ketones, ur NEGATIVE  NEGATIVE mg/dL   Protein, ur NEGATIVE  NEGATIVE mg/dL   Urobilinogen, UA 0.2  0.0 - 1.0 mg/dL   Nitrite NEGATIVE  NEGATIVE   Leukocytes, UA SMALL (*) NEGATIVE  URINE MICROSCOPIC-ADD ON     Status: Abnormal   Collection Time    04/30/13 12:14 AM      Result Value Range   Squamous Epithelial / LPF FEW (*) RARE   WBC, UA 11-20  <3 WBC/hpf   RBC / HPF 3-6  <3 RBC/hpf    Bacteria, UA FEW (*) RARE  POCT PREGNANCY, URINE     Status: None   Collection Time    04/30/13 12:34 AM      Result Value Range   Preg Test, Ur NEGATIVE  NEGATIVE  CBC     Status: None   Collection Time    04/30/13 12:56 AM      Result Value Range   WBC 8.7  4.0 - 10.5 K/uL   RBC 4.46  3.87 - 5.11 MIL/uL   Hemoglobin 12.5  12.0 - 15.0 g/dL   HCT 16.1  09.6 - 04.5 %   MCV 81.8  78.0 - 100.0 fL   MCH 28.0  26.0 - 34.0 pg   MCHC 34.2  30.0 - 36.0 g/dL   RDW 40.9  81.1 - 91.4 %   Platelets 309  150 - 400 K/uL  WET PREP, GENITAL     Status: Abnormal   Collection Time    04/30/13  1:10 AM      Result Value Range   Yeast Wet Prep HPF POC NONE SEEN  NONE SEEN   Trich, Wet Prep FEW (*) NONE SEEN   Clue Cells Wet Prep HPF POC FEW (*) NONE SEEN   WBC, Wet Prep HPF POC FEW (*) NONE SEEN    IMAGING US Transvaginal Non-ob  04/30/2013   CLINICAL DATA:  Right lower quadrant abdominal pain.  EXAM: TRANSABDOMINAL AND TRANSVAGINAL ULTRASOUND OF PELVIS  TECHNIQUE: Both transabdominal and transvaginal ultrasound examinations of the pelvis were performed. Transabdominal technique was performed for global imaging of the pelvis including uterus, ovaries, adnexal regions, and pelvic cul-de-sac. It was necessary to proceed with endovaginal exam following the transabdominal exam to visualize the uterus and ovaries in greater detail.  COMPARISON:  Pelvic ultrasound performed 11/11/2011  FINDINGS: Uterus  Measurements: 6.2 x 3.3 x 4.7 cm. No fibroids or other mass visualized.  Endometrium  Thickness: 1.0 cm in thickness.  No focal abnormality visualized.  Right ovary  Measurements: 3.4 x 2.5 x 2.3 cm. Normal appearance/no adnexal mass.  Left ovary  Measurements: 3.3 x 2.0 x 2.1 cm. Normal appearance/no adnexal mass.  Limited Doppler evaluation demonstrates normal color Doppler blood flow with respect to both ovaries; there is no evidence for ovarian torsion.  Other findings  Trace free fluid is seen within  the pelvic cul-de-sac, likely physiologic in nature.  IMPRESSION: Unremarkable pelvic ultrasound.   Electronically Signed   By: Roanna Raider M.D.   On: 04/30/2013 01:49   US Pelvis Complete  04/30/2013   CLINICAL DATA:  Right lower quadrant abdominal pain.  EXAM: TRANSABDOMINAL AND TRANSVAGINAL ULTRASOUND OF PELVIS  TECHNIQUE: Both transabdominal and transvaginal ultrasound examinations of the pelvis were performed. Transabdominal technique was performed for global imaging of the pelvis including uterus, ovaries, adnexal regions, and pelvic cul-de-sac. It was necessary to proceed with endovaginal exam following the transabdominal exam to visualize the uterus and ovaries in greater detail.  COMPARISON:  Pelvic ultrasound performed 11/11/2011  FINDINGS: Uterus  Measurements: 6.2 x 3.3 x 4.7 cm. No fibroids or other mass visualized.  Endometrium  Thickness: 1.0 cm in thickness.  No focal abnormality visualized.  Right ovary  Measurements: 3.4 x 2.5 x 2.3 cm. Normal appearance/no adnexal mass.  Left ovary  Measurements: 3.3 x 2.0 x 2.1 cm. Normal appearance/no adnexal mass.  Limited Doppler evaluation demonstrates normal color Doppler blood flow with respect to both ovaries; there is no evidence for ovarian torsion.  Other findings  Trace free fluid is seen within the pelvic cul-de-sac, likely physiologic in nature.  IMPRESSION: Unremarkable pelvic ultrasound.   Electronically Signed   By: Roanna Raider M.D.   On: 04/30/2013 01:49   MAU COURSE Pain resolved with Toradol. Flagyl given for Trichomonas.  Low suspicion for appendicitis do to absence of fever, GI complaints or leukocytosis. Pain lower than McBurney's point.  ASSESSMENT 1. Trichomonas vaginitis   2. Physiological ovarian cysts (? Polycystic appearing ovaries)     PLAN Discharge home in stable condition. Appendicitis precautions. Partner needs treatment for Trichomonas. No intercourse until one week after both of you and  treated. GC/Chlamydia cultures pending. Follow up with gynecologist if no period in the next month.     Follow-up Information   Follow up with [facility]. (As needed if symptoms worsen)    Contact information:   [address] [place] [address]       Follow up with [facility] [place]. (As needed for additional STD testing)    Contact information:   [address] [place] [address] [phone]       Medication List    STOP taking these medications       ibuprofen 600 MG tablet  Commonly known as:  ADVIL,MOTRIN      TAKE these medications       acetaminophen 325 MG tablet  Commonly known as:  TYLENOL  Take 650 mg by mouth every 6 (six) hours as needed for pain.     ketorolac 10 MG tablet  Commonly known as:  TORADOL  Take 1 tablet (10 mg total) by mouth every 6 (six) hours as needed.     tiZANidine 4 MG tablet  Commonly known as:  ZANAFLEX  Take 1-2 tablets (4-8 mg total) by mouth every 6 (six) hours as needed.       [place], CNM 04/30/2013  3:16 AM

## 2013-05-01 LAB — URINE CULTURE

## 2013-05-03 NOTE — MAU Provider Note (Signed)
Attestation of Attending Supervision of Advanced Practitioner: Evaluation and management procedures were performed by the PA/NP/CNM/OB Fellow under my supervision/collaboration. Chart reviewed and agree with management and plan.  Tai Skelly V 05/03/2013 7:57 PM

## 2013-05-05 ENCOUNTER — Emergency Department (HOSPITAL_COMMUNITY): Payer: Medicaid Other

## 2013-05-05 ENCOUNTER — Emergency Department (HOSPITAL_COMMUNITY)
Admission: EM | Admit: 2013-05-05 | Discharge: 2013-05-05 | Disposition: A | Discharge status: Home / Self Care | Payer: Medicaid Other | Attending: Emergency Medicine | Admitting: Emergency Medicine

## 2013-05-05 ENCOUNTER — Encounter (HOSPITAL_COMMUNITY): Payer: Self-pay | Admitting: Emergency Medicine

## 2013-05-05 DIAGNOSIS — Z792 Long term (current) use of antibiotics: Secondary | ICD-10-CM | POA: Insufficient documentation

## 2013-05-05 DIAGNOSIS — Z8619 Personal history of other infectious and parasitic diseases: Secondary | ICD-10-CM | POA: Insufficient documentation

## 2013-05-05 DIAGNOSIS — R109 Unspecified abdominal pain: Secondary | ICD-10-CM | POA: Insufficient documentation

## 2013-05-05 DIAGNOSIS — N390 Urinary tract infection, site not specified: Secondary | ICD-10-CM | POA: Insufficient documentation

## 2013-05-05 DIAGNOSIS — Z8711 Personal history of peptic ulcer disease: Secondary | ICD-10-CM | POA: Insufficient documentation

## 2013-05-05 DIAGNOSIS — Z8719 Personal history of other diseases of the digestive system: Secondary | ICD-10-CM | POA: Insufficient documentation

## 2013-05-05 DIAGNOSIS — Z8679 Personal history of other diseases of the circulatory system: Secondary | ICD-10-CM | POA: Insufficient documentation

## 2013-05-05 DIAGNOSIS — F17200 Nicotine dependence, unspecified, uncomplicated: Secondary | ICD-10-CM | POA: Insufficient documentation

## 2013-05-05 LAB — URINALYSIS, ROUTINE W REFLEX MICROSCOPIC
Bilirubin Urine: NEGATIVE
Glucose, UA: NEGATIVE mg/dL
Ketones, ur: NEGATIVE mg/dL
pH: 5 (ref 5.0–8.0)

## 2013-05-05 LAB — URINE MICROSCOPIC-ADD ON

## 2013-05-05 LAB — POCT PREGNANCY, URINE: Preg Test, Ur: NEGATIVE

## 2013-05-05 MED ORDER — CEPHALEXIN 500 MG PO CAPS: 500.0000 mg | ORAL_CAPSULE | Freq: Four times a day (QID) | ORAL | Status: DC

## 2013-05-05 MED ORDER — HYDROCODONE-ACETAMINOPHEN 5-325 MG PO TABS: 2.0000 | ORAL_TABLET | ORAL | Status: DC | PRN

## 2013-05-05 MED ORDER — CEFTRIAXONE SODIUM 1 G IJ SOLR
1.0000 g | Freq: Once | INTRAMUSCULAR | Status: AC
  Administered 2013-05-05: 1 g via INTRAMUSCULAR
  Filled 2013-05-05 (×2): qty 10

## 2013-05-05 MED ORDER — LIDOCAINE HCL (PF) 1 % IJ SOLN
INTRAMUSCULAR | Status: AC
  Administered 2013-05-05: 2 mL
  Filled 2013-05-05: qty 5

## 2013-05-05 MED ORDER — ONDANSETRON HCL 4 MG PO TABS: 4.0000 mg | ORAL_TABLET | Freq: Four times a day (QID) | ORAL | Status: DC

## 2013-05-05 MED ORDER — LIDOCAINE HCL (PF) 1 % IJ SOLN
2.0000 mL | Freq: Once | INTRAMUSCULAR | Status: AC
  Administered 2013-05-05: 2 mL

## 2013-05-05 MED ORDER — HYDROCODONE-ACETAMINOPHEN 5-325 MG PO TABS
2.0000 | ORAL_TABLET | Freq: Once | ORAL | Status: AC
  Administered 2013-05-05: 2 via ORAL
  Filled 2013-05-05: qty 2

## 2013-05-05 NOTE — ED Notes (Signed)
Patient transported to CT 

## 2013-05-05 NOTE — ED Notes (Signed)
Pt reports left flank pain onset 2 days ago- admits to increased frequency in urination.  Denies vaginal discharge or bleeding.  LMP 2 months ago- pt reports she was seen at [facility] a week ago and informed she was not pregnant but did have a cyst on her right ovary.  Pt states the pain today is different than last week.

## 2013-05-05 NOTE — ED Notes (Signed)
Present with left sided abdominal/flank pain began yesterday. Took 2 laxatives yesterday believing it was constipation after BM, no relief of pain. Pain is worse with sudden movements, nothing makes pain better. Pain is described as something "stuck inside my side" denies frequency, dneis hematuria, denies dysuria.

## 2013-05-05 NOTE — ED Provider Notes (Signed)
CSN: [id number]     Arrival date & time 05/05/13  1510 History   First MD Initiated Contact with Patient 05/05/13 1548     Chief Complaint  Patient presents with  . Flank Pain   (Consider location/radiation/quality/duration/timing/severity/associated sxs/prior Treatment) HPI Comments: Patient presents with 2 day history of left-sided abdominal pain and flank pain has been constant. She took laxatives taken which was constipated but has been having normal bowel movements. He denies vomiting. Pain is worse with movement. Denies dysuria, hematuria bleeding or discharge. Reports history of ovarian cysts but this feels different. Denies any history of kidney stones or urinary tract infections. No chest pain or shortness of breath. She no fever or chills. She is has not had pain like this in the past.  The history is provided by the patient.    Past Medical History  Diagnosis Date  . Migraine   . 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 GERD (gastroesophageal reflux disease)    Past Surgical History  Procedure Laterality Date  . No past surgeries     Family History  Problem Relation Age of Onset  . Anesthesia problems Neg Hx   . Hypertension Mother   . Asthma Brother    History  Substance Use Topics  . Smoking status: Current Every Day Smoker -- 0.25 packs/day    Types: Cigarettes, Cigars  . Smokeless tobacco: Never Used  . Alcohol Use: No   OB History   Grav Para Term Preterm Abortions TAB SAB Ect Mult Living   2 1 1  0 1 1 0 0 0 1     Review of Systems  Constitutional: Negative for fever, activity change and appetite change.  Eyes: Negative for visual disturbance.  Respiratory: Negative for chest tightness and shortness of breath.   Cardiovascular: Negative for chest pain.  Gastrointestinal: Positive for abdominal pain. Negative for nausea and vomiting.  Genitourinary: Positive for flank pain. Negative for dysuria, hematuria, vaginal bleeding and vaginal discharge.   Musculoskeletal: Negative for back pain.  Skin: Negative for rash.  Neurological: Negative for dizziness, weakness and headaches.  A complete 10 system review of systems was obtained and all systems are negative except as noted in the HPI and PMH.    Allergies  Review of patient's allergies indicates no known allergies.  Home Medications   Current Outpatient Rx  Name  Route  Sig  Dispense  Refill  . OVER THE COUNTER MEDICATION   Oral   Take 1 tablet by mouth daily as needed (for constipation).         . cephALEXin (KEFLEX) 500 MG capsule   Oral   Take 1 capsule (500 mg total) by mouth 4 (four) times daily.   40 capsule   0   . HYDROcodone-acetaminophen (NORCO/VICODIN) 5-325 MG per tablet   Oral   Take 2 tablets by mouth every 4 (four) hours as needed.   10 tablet   0   . ondansetron (ZOFRAN) 4 MG tablet   Oral   Take 1 tablet (4 mg total) by mouth every 6 (six) hours.   12 tablet   0    BP 103/66  Pulse 90  Temp(Src) 98 F (36.7 C) (Oral)  Resp 18  Wt 155 lb 2 oz (70.364 kg)  SpO2 100%  LMP 03/05/2013 Physical Exam  Constitutional: She is oriented to person, place, and time. She appears well-developed and well-nourished. No distress.  HENT:  Head: Normocephalic and atraumatic.  Mouth/Throat: Oropharynx is clear and moist. No oropharyngeal exudate.  Eyes: Conjunctivae and EOM are normal. Pupils are equal, round, and reactive to light.  Neck: Normal range of motion. Neck supple.  Cardiovascular: Normal rate, regular rhythm and normal heart sounds.   No murmur heard. Pulmonary/Chest: Effort normal and breath sounds normal. No respiratory distress.  Abdominal: Soft. There is tenderness. There is no rebound and no guarding.  Mild left upper quadrant tenderness. No guarding or rebound. No lower abdominal tenderness  Musculoskeletal: Normal range of motion. She exhibits tenderness. She exhibits no edema.  Left CVA tenderness  Neurological: She is alert and  oriented to person, place, and time. No cranial nerve deficit. She exhibits normal muscle tone. Coordination normal.  Skin: Skin is warm.    ED Course  Procedures (including critical care time) Labs Review Labs Reviewed  URINALYSIS, ROUTINE W REFLEX MICROSCOPIC - Abnormal; Notable for the following:    APPearance CLOUDY (*)    Leukocytes, UA LARGE (*)    All other components within normal limits  URINE MICROSCOPIC-ADD ON - Abnormal; Notable for the following:    Squamous Epithelial / LPF FEW (*)    Bacteria, UA FEW (*)    All other components within normal limits  URINE CULTURE  URINE CULTURE  POCT PREGNANCY, URINE   Imaging Review Ct Abdomen Pelvis Wo Contrast  05/05/2013   CLINICAL DATA:  Left-sided flank pain.  EXAM: CT ABDOMEN AND PELVIS WITHOUT CONTRAST  TECHNIQUE: Multidetector CT imaging of the abdomen and pelvis was performed following the standard protocol without intravenous contrast.  COMPARISON:  No priors.  FINDINGS: Lung Bases: Unremarkable.  Abdomen/Pelvis: There are no abnormal calcifications within the collecting system of either kidney, along the course of either ureter, or within the lumen of the urinary bladder. No hydroureteronephrosis or perinephric stranding to suggest urinary tract obstruction at this time. The unenhanced appearance of the kidneys is unremarkable bilaterally.  The unenhanced appearance of the liver, gallbladder, pancreas, spleen and bilateral adrenal glands is unremarkable. Normal appendix. Trace volume of free fluid in the cul-de-sac, presumably physiologic in this young female patient. No larger volume of ascites. No pneumoperitoneum. No pathologic distention of small bowel. No definite lymphadenopathy identified within the abdomen or pelvis on today's non contrast CT examination. The uterus and ovaries are unremarkable in appearance.  Musculoskeletal: There are no aggressive appearing lytic or blastic lesions noted in the visualized portions of the  skeleton.  IMPRESSION: 1. No abnormal urinary tract calculi or findings of urinary tract obstruction. 2. No acute findings in the abdomen or pelvis to account for the patient's symptoms. 3. Normal appendix.   Electronically Signed   By: Trudie Reed M.D.   On: 05/05/2013 17:16    EKG Interpretation   None       MDM   1. Urinary tract infection   2. Flank pain    2 day history of flank pain that is not relieved by laxatives. No vomiting. No urinary or vaginal symptoms. Seen 5 days ago for lower abdominal pain and had negative ultrasound of ovaries. States this pain is different.  Left CVA tenderness on exam. UA shows leukocytes and bacteria. We'll treat for pyelonephritis. No evidence of obstructing kidney stone.  No vomiting in the ED. Abdomen soft without peritoneal signs.  BP 103/66  Pulse 90  Temp(Src) 98 F (36.7 C) (Oral)  Resp 18  Wt 155 lb 2 oz (70.364 kg)  SpO2 100%  LMP 03/05/2013    Jeannett Senior  Louay Myrie, MD 05/05/13 1610

## 2013-05-05 NOTE — ED Notes (Signed)
Pt tolerating fluids and crackers well.

## 2013-05-08 LAB — URINE CULTURE

## 2013-05-09 ENCOUNTER — Telehealth (HOSPITAL_COMMUNITY): Payer: Self-pay | Admitting: Emergency Medicine

## 2013-05-09 NOTE — ED Notes (Signed)
Post ED Visit - Positive Culture Follow-up  Culture report reviewed by antimicrobial stewardship pharmacist: []  Wes Dulaney, Pharm.D., BCPS [x]  Celedonio Miyamoto, [address].D., BCPS []  Georgina Pillion, Pharm.D., BCPS []  [place], [address].D., BCPS, AAHIVP []  Estella Husk, Pharm.D., BCPS, AAHIVP  Positive urine culture Treated with Keflex, organism sensitive to the same and no further patient follow-up is required at this time.  Stacey [name] 05/09/2013, 2:56 PM

## 2013-05-17 ENCOUNTER — Emergency Department (HOSPITAL_COMMUNITY)
Admission: EM | Admit: 2013-05-17 | Discharge: 2013-05-17 | Disposition: A | Discharge status: Home / Self Care | Payer: Medicaid Other | Attending: Emergency Medicine | Admitting: Emergency Medicine

## 2013-05-17 ENCOUNTER — Encounter (HOSPITAL_COMMUNITY): Payer: Self-pay | Admitting: Emergency Medicine

## 2013-05-17 ENCOUNTER — Emergency Department (HOSPITAL_COMMUNITY): Payer: Medicaid Other

## 2013-05-17 DIAGNOSIS — K602 Anal fissure, unspecified: Secondary | ICD-10-CM | POA: Insufficient documentation

## 2013-05-17 DIAGNOSIS — Z8679 Personal history of other diseases of the circulatory system: Secondary | ICD-10-CM | POA: Insufficient documentation

## 2013-05-17 DIAGNOSIS — K625 Hemorrhage of anus and rectum: Secondary | ICD-10-CM | POA: Insufficient documentation

## 2013-05-17 DIAGNOSIS — Z792 Long term (current) use of antibiotics: Secondary | ICD-10-CM | POA: Insufficient documentation

## 2013-05-17 DIAGNOSIS — J40 Bronchitis, not specified as acute or chronic: Secondary | ICD-10-CM | POA: Insufficient documentation

## 2013-05-17 DIAGNOSIS — F17200 Nicotine dependence, unspecified, uncomplicated: Secondary | ICD-10-CM | POA: Insufficient documentation

## 2013-05-17 DIAGNOSIS — Z8719 Personal history of other diseases of the digestive system: Secondary | ICD-10-CM | POA: Insufficient documentation

## 2013-05-17 LAB — BASIC METABOLIC PANEL
BUN: 7 mg/dL (ref 6–23)
Calcium: 9.3 mg/dL (ref 8.4–10.5)
Chloride: 101 mEq/L (ref 96–112)
Creatinine, Ser: 0.67 mg/dL (ref 0.50–1.10)
GFR calc Af Amer: >90 mL/min (ref >90)
Glucose, Bld: 96 mg/dL (ref 70–99)
Potassium: 3.8 mEq/L (ref 3.5–5.1)

## 2013-05-17 LAB — CBC
HCT: 36 % (ref 36.0–46.0)
Hemoglobin: 12.3 g/dL (ref 12.0–15.0)
MCV: 82.6 fL (ref 78.0–100.0)
RDW: 12.8 % (ref 11.5–15.5)
WBC: 6.3 10*3/uL (ref 4.0–10.5)

## 2013-05-17 MED ORDER — ALBUTEROL SULFATE HFA 108 (90 BASE) MCG/ACT IN AERS
2.0000 | INHALATION_SPRAY | RESPIRATORY_TRACT | Status: DC
  Administered 2013-05-17: 2 via RESPIRATORY_TRACT
  Filled 2013-05-17: qty 6.7

## 2013-05-17 MED ORDER — PREDNISONE 20 MG PO TABS: 40.0000 mg | ORAL_TABLET | Freq: Every day | ORAL | Status: DC

## 2013-05-17 MED ORDER — LIDOCAINE 4 % EX CREA: 1.0000 "application " | TOPICAL_CREAM | CUTANEOUS | Status: DC | PRN

## 2013-05-17 NOTE — ED Provider Notes (Signed)
  Medical screening examination/treatment/procedure(s) were performed by non-physician practitioner and as supervising physician I was immediately available for consultation/collaboration.  EKG Interpretation   None          Renuka Farfan, MD 05/17/13 1945 

## 2013-05-17 NOTE — ED Provider Notes (Signed)
CSN: [id number]     Arrival date & time 05/17/13  1257 History   First MD Initiated Contact with Patient 05/17/13 1346     Chief Complaint  Patient presents with  . Cough  . Rectal Bleeding   (Consider location/radiation/quality/duration/timing/severity/associated sxs/prior Treatment) HPI Stacey [name] is a 22 y.o. female who presents emergency department with 2 complaints. She states one is cough and upper respiratory type symptoms she has had for a week. She states that cough is progressively getting worse and now she's coughing up bloody sputum. States it's only small amount of streaks in her sputum. She denies any fever, chills, shortness of breath. She denies any chest pain. States she's not taking any medications for this. No recent travel or surgeries. She reports she's also noticed that she has had blood when she wipes after having a bowel movement. States this started several weeks ago. States the last several days it has gotten worse. She states that she had a bowel movement with blood on top of it. States stool is otherwise normal. Patient states that she does not believe this is vaginal bleeding. She states that she does have some pain around her rectal area. She denies a history of the same. She denies any abdominal pain. She denies any fever, chills, malaise.  Past Medical History  Diagnosis Date  . Migraine   . 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 GERD (gastroesophageal reflux disease)    Past Surgical History  Procedure Laterality Date  . No past surgeries     Family History  Problem Relation Age of Onset  . Anesthesia problems Neg Hx   . Hypertension Mother   . Asthma Brother    History  Substance Use Topics  . Smoking status: Current Every Day Smoker -- 0.25 packs/day    Types: Cigarettes, Cigars  . Smokeless tobacco: Never Used  . Alcohol Use: No   OB History   Grav Para Term Preterm Abortions TAB SAB Ect Mult Living   2 1 1  0 1 1 0 0 0 1     Review  of Systems  Constitutional: Negative for fever and chills.  Respiratory: Positive for cough. Negative for chest tightness, shortness of breath, wheezing and stridor.   Cardiovascular: Negative for chest pain, palpitations and leg swelling.  Gastrointestinal: Positive for blood in stool and anal bleeding. Negative for nausea, vomiting, abdominal pain, diarrhea and constipation.  Genitourinary: Negative for dysuria, flank pain, vaginal bleeding, vaginal discharge, vaginal pain and pelvic pain.  Musculoskeletal: Negative for arthralgias, myalgias, neck pain and neck stiffness.  Skin: Negative for rash.  Neurological: Negative for dizziness, weakness and headaches.  All other systems reviewed and are negative.    Allergies  Review of patient's allergies indicates no known allergies.  Home Medications   Current Outpatient Rx  Name  Route  Sig  Dispense  Refill  . cephALEXin (KEFLEX) 500 MG capsule   Oral   Take 1 capsule (500 mg total) by mouth 4 (four) times daily.   40 capsule   0   . HYDROcodone-acetaminophen (NORCO/VICODIN) 5-325 MG per tablet   Oral   Take 2 tablets by mouth every 4 (four) hours as needed.   10 tablet   0   . ondansetron (ZOFRAN) 4 MG tablet   Oral   Take 1 tablet (4 mg total) by mouth every 6 (six) hours.   12 tablet   0   . OVER THE COUNTER MEDICATION  Oral   Take 1 tablet by mouth daily as needed (for constipation).          BP 122/67  Pulse 96  Temp(Src) 97.9 F (36.6 C) (Oral)  Resp 16  Wt 156 lb 9.6 oz (71.033 kg)  SpO2 100%  LMP 04/29/2013 Physical Exam  Nursing note and vitals reviewed. Constitutional: She appears well-developed and well-nourished. No distress.  HENT:  Head: Normocephalic.  Eyes: Conjunctivae are normal.  Neck: Neck supple.  Cardiovascular: Normal rate, regular rhythm and normal heart sounds.   Pulmonary/Chest: Effort normal and breath sounds normal. No respiratory distress. She has no wheezes. She has no rales.   Abdominal: Soft. Bowel sounds are normal. She exhibits no distension. There is no tenderness. There is no rebound.  Genitourinary:  Small external hemorrhoids. No active bleeding. Rectum is normal with no blood in the rectal canal. Stool is brown. There is a 1 cm fissure in posterior rectum. Tender to palpation. No active bleeding.  Musculoskeletal: She exhibits no edema.  Neurological: She is alert.  Skin: Skin is warm and dry.  Psychiatric: She has a normal mood and affect. Her behavior is normal.    ED Course  Procedures (including critical care time) Labs Review Labs Reviewed  CBC  BASIC METABOLIC PANEL   Imaging Review Dg Chest 2 View  05/17/2013   CLINICAL DATA:  Chest pain.  Hemoptysis.  EXAM: CHEST  2 VIEW  COMPARISON:  04/14/2012.  FINDINGS: The heart size and mediastinal contours are within normal limits. Both lungs are clear. The visualized skeletal structures are unremarkable.  IMPRESSION: No active cardiopulmonary disease.   Electronically Signed   By: Maisie Fus  Register   On: 05/17/2013 14:42    EKG Interpretation   None       MDM   1. Bronchitis   2. Rectal fissure     Patient here with persistent cough with some bloody streaks in the sputum. Her chest x-ray is normal. Her vital signs are normal as well. Her lungs are clear. She is a smoker. We'll start for course of prednisone an inhaler to help with her cough. Her rectal exam showed a anal fissure. No active bleeding at this time. Sitz baths and topical lidocaine for symptoms. Follow up as needed.   Filed Vitals:   05/17/13 1300  BP: 122/67  Pulse: 96  Temp: 97.9 F (36.6 C)  TempSrc: Oral  Resp: 16  Weight: 156 lb 9.6 oz (71.033 kg)  SpO2: 100%       Reinhart Saulters A Cantrell Larouche, PA-C 05/17/13 1522

## 2013-05-17 NOTE — ED Notes (Signed)
Pt c/o bright red blood when she coughs.  C/o cold x 1 week.  Denies chest pain. Complains also of bright red blood on the toilet paper when she wipes after a bowel movement - states stool is normal.  C/o rectal pain.

## 2013-06-04 DIAGNOSIS — F17200 Nicotine dependence, unspecified, uncomplicated: Secondary | ICD-10-CM | POA: Insufficient documentation

## 2013-06-04 DIAGNOSIS — Z8719 Personal history of other diseases of the digestive system: Secondary | ICD-10-CM | POA: Insufficient documentation

## 2013-06-04 DIAGNOSIS — Z792 Long term (current) use of antibiotics: Secondary | ICD-10-CM | POA: Insufficient documentation

## 2013-06-04 DIAGNOSIS — Z8619 Personal history of other infectious and parasitic diseases: Secondary | ICD-10-CM | POA: Insufficient documentation

## 2013-06-04 DIAGNOSIS — Z8679 Personal history of other diseases of the circulatory system: Secondary | ICD-10-CM | POA: Insufficient documentation

## 2013-06-04 DIAGNOSIS — R5381 Other malaise: Secondary | ICD-10-CM | POA: Insufficient documentation

## 2013-06-04 DIAGNOSIS — Z3202 Encounter for pregnancy test, result negative: Secondary | ICD-10-CM | POA: Insufficient documentation

## 2013-06-04 DIAGNOSIS — J029 Acute pharyngitis, unspecified: Secondary | ICD-10-CM | POA: Insufficient documentation

## 2013-06-05 ENCOUNTER — Encounter (HOSPITAL_COMMUNITY): Payer: Self-pay | Admitting: Emergency Medicine

## 2013-06-05 ENCOUNTER — Emergency Department (HOSPITAL_COMMUNITY)
Admission: EM | Admit: 2013-06-05 | Discharge: 2013-06-05 | Disposition: A | Discharge status: Home / Self Care | Payer: Medicaid Other | Attending: Emergency Medicine | Admitting: Emergency Medicine

## 2013-06-05 DIAGNOSIS — J029 Acute pharyngitis, unspecified: Secondary | ICD-10-CM

## 2013-06-05 LAB — URINALYSIS, ROUTINE W REFLEX MICROSCOPIC
Bilirubin Urine: NEGATIVE
Glucose, UA: NEGATIVE mg/dL
Hgb urine dipstick: NEGATIVE
Specific Gravity, Urine: 1.02 (ref 1.005–1.030)
Urobilinogen, UA: 1 mg/dL (ref 0.0–1.0)
pH: 8 (ref 5.0–8.0)

## 2013-06-05 LAB — POCT PREGNANCY, URINE: Preg Test, Ur: NEGATIVE

## 2013-06-05 LAB — URINE MICROSCOPIC-ADD ON

## 2013-06-05 MED ORDER — IBUPROFEN 600 MG PO TABS: 600.0000 mg | ORAL_TABLET | Freq: Four times a day (QID) | ORAL | Status: DC | PRN

## 2013-06-05 MED ORDER — AMOXICILLIN 500 MG PO CAPS: 500.0000 mg | ORAL_CAPSULE | Freq: Three times a day (TID) | ORAL | Status: DC

## 2013-06-05 MED ORDER — FLUCONAZOLE 150 MG PO TABS: 150.0000 mg | ORAL_TABLET | Freq: Once | ORAL | Status: DC

## 2013-06-05 MED ORDER — HYDROCODONE-ACETAMINOPHEN 7.5-325 MG/15ML PO SOLN: 15.0000 mL | Freq: Three times a day (TID) | ORAL | Status: DC | PRN

## 2013-06-05 NOTE — ED Provider Notes (Signed)
CSN: [id number]     Arrival date & time 06/04/13  2358 History   First MD Initiated Contact with Patient 06/05/13 0030     Chief Complaint  Patient presents with  . Sore Throat   (Consider location/radiation/quality/duration/timing/severity/associated sxs/prior Treatment) Patient is a 22 y.o. female presenting with pharyngitis. The history is provided by the patient. No language interpreter was used.  Sore Throat This is a new problem. The current episode started yesterday. The problem occurs constantly. The problem has been gradually worsening. Associated symptoms include fatigue, a sore throat and swollen glands. Pertinent negatives include no fever, nausea, vomiting or weakness. The symptoms are aggravated by swallowing. Treatments tried: Halls cough drops. The treatment provided no relief.    Past Medical History  Diagnosis Date  . Migraine   . 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 GERD (gastroesophageal reflux disease)    Past Surgical History  Procedure Laterality Date  . No past surgeries     Family History  Problem Relation Age of Onset  . Anesthesia problems Neg Hx   . Hypertension Mother   . Asthma Brother    History  Substance Use Topics  . Smoking status: Current Every Day Smoker -- 0.25 packs/day    Types: Cigarettes, Cigars  . Smokeless tobacco: Never Used  . Alcohol Use: No   OB History   Grav Para Term Preterm Abortions TAB SAB Ect Mult Living   2 1 1  0 1 1 0 0 0 1     Review of Systems  Constitutional: Positive for fatigue. Negative for fever.  HENT: Positive for sore throat. Negative for drooling and trouble swallowing.   Respiratory: Negative for shortness of breath.   Gastrointestinal: Negative for nausea and vomiting.  Musculoskeletal: Negative for neck stiffness.  Neurological: Negative for weakness.  All other systems reviewed and are negative.    Allergies  Review of patient's allergies indicates no known allergies.  Home Medications    Current Outpatient Rx  Name  Route  Sig  Dispense  Refill  . cephALEXin (KEFLEX) 500 MG capsule   Oral   Take 1 capsule (500 mg total) by mouth 4 (four) times daily.   40 capsule   0   . amoxicillin (AMOXIL) 500 MG capsule   Oral   Take 1 capsule (500 mg total) by mouth 3 (three) times daily. Start on 06/07/13 if no improvement in symptoms   30 capsule   0   . fluconazole (DIFLUCAN) 150 MG tablet   Oral   Take 1 tablet (150 mg total) by mouth once.   1 tablet   0   . HYDROcodone-acetaminophen (HYCET) 7.5-325 mg/15 ml solution   Oral   Take 15 mLs by mouth every 8 (eight) hours as needed for moderate pain.   120 mL   0   . ibuprofen (ADVIL,MOTRIN) 600 MG tablet   Oral   Take 1 tablet (600 mg total) by mouth every 6 (six) hours as needed.   30 tablet   0    BP 130/69  Pulse 110  Temp(Src) 98.2 F (36.8 C) (Oral)  Resp 18  Wt 157 lb 11.2 oz (71.532 kg)  SpO2 100%  LMP 04/29/2013 Physical Exam  Nursing note and vitals reviewed. Constitutional: She is oriented to person, place, and time. She appears well-developed and well-nourished. No distress.  Patient was nontoxic appearing and in no acute distress  HENT:  Head: Normocephalic and atraumatic.  Right Ear:  External ear normal.  Left Ear: External ear normal.  Nose: Nose normal.  Mouth/Throat: Uvula is midline and mucous membranes are normal. Posterior oropharyngeal erythema (Mild) present. No oropharyngeal exudate or posterior oropharyngeal edema.  Posterior oral pharynx erythematous and tonsils enlarged and erythematous bilaterally. Uvula midline and patient tolerating secretions without difficulty.  Eyes: Conjunctivae and EOM are normal. Pupils are equal, round, and reactive to light. No scleral icterus.  Neck: Normal range of motion. Neck supple.  Cardiovascular: Normal rate, regular rhythm and normal heart sounds.   Pulmonary/Chest: Effort normal and breath sounds normal. No respiratory distress. She has no  wheezes. She has no rales.  Abdominal: Soft. She exhibits no distension. There is no tenderness.  Musculoskeletal: Normal range of motion.  Neurological: She is alert and oriented to person, place, and time.  Skin: Skin is warm and dry. No rash noted. She is not diaphoretic. No erythema. No pallor.  Psychiatric: She has a normal mood and affect. Her behavior is normal.    ED Course  Procedures (including critical care time) Labs Review Labs Reviewed  URINALYSIS, ROUTINE W REFLEX MICROSCOPIC - Abnormal; Notable for the following:    APPearance CLOUDY (*)    Leukocytes, UA SMALL (*)    All other components within normal limits  URINE MICROSCOPIC-ADD ON - Abnormal; Notable for the following:    Squamous Epithelial / LPF MANY (*)    Bacteria, UA FEW (*)    All other components within normal limits  RAPID STREP SCREEN  CULTURE, GROUP A STREP  URINE CULTURE  POCT PREGNANCY, URINE   Imaging Review No results found.  EKG Interpretation   None       MDM   1. Pharyngitis    Uncomplicated pharyngitis. Patient well and nontoxic appearing, hemodynamically stable, and afebrile. Uvula midline without evidence of peritonsillar abscess and patient tolerating secretions without difficulty. No nuchal rigidity or meningismus. Airway patent and there is no stridor. Rapid strep screen negative today, however, given physical exam findings Will give patient prescription for amoxicillin to start in 48 hours if no improvement in symptoms. Patient also requesting Diflucan and she is prone to yeast infections from antibiotics. Hycet and ibuprofen for symptomatic management. Return precautions discussed and patient agreeable to plan with no unaddressed concerns.    Antony Madura, [place] 06/15/13 [phone]

## 2013-06-05 NOTE — ED Provider Notes (Signed)
Medical screening examination/treatment/procedure(s) were performed by non-physician practitioner and as supervising physician I was immediately available for consultation/collaboration.  Hurman Horn, MD 06/05/13 2035

## 2013-06-05 NOTE — ED Notes (Addendum)
Unable to give urine specimen at this time  , urine specimen cup given to pt. while waiting at triage .

## 2013-06-05 NOTE — ED Notes (Signed)
Pt. reports sore throat with swelling and dysuria onset yesterday .

## 2013-06-06 LAB — URINE CULTURE
Colony Count: NO GROWTH
Culture: NO GROWTH

## 2013-06-06 LAB — CULTURE, GROUP A STREP

## 2013-06-15 NOTE — ED Provider Notes (Signed)
Medical screening examination/treatment/procedure(s) were performed by non-physician practitioner and as supervising physician I was immediately available for consultation/collaboration.  Sadarius Norman M Raha Tennison, MD 06/15/13 2059 

## 2013-07-29 ENCOUNTER — Inpatient Hospital Stay (HOSPITAL_COMMUNITY)
Admission: AD | Admit: 2013-07-29 | Discharge: 2013-07-30 | Discharge status: Against Medical Advice | Payer: Medicaid Other | Source: Ambulatory Visit | Attending: Obstetrics & Gynecology | Admitting: Obstetrics & Gynecology

## 2013-07-29 DIAGNOSIS — N760 Acute vaginitis: Secondary | ICD-10-CM | POA: Insufficient documentation

## 2013-07-29 DIAGNOSIS — A5901 Trichomonal vulvovaginitis: Secondary | ICD-10-CM | POA: Insufficient documentation

## 2013-07-29 DIAGNOSIS — Z8719 Personal history of other diseases of the digestive system: Secondary | ICD-10-CM | POA: Insufficient documentation

## 2013-07-29 DIAGNOSIS — B9689 Other specified bacterial agents as the cause of diseases classified elsewhere: Secondary | ICD-10-CM | POA: Insufficient documentation

## 2013-07-29 DIAGNOSIS — Z8679 Personal history of other diseases of the circulatory system: Secondary | ICD-10-CM | POA: Insufficient documentation

## 2013-07-29 DIAGNOSIS — A499 Bacterial infection, unspecified: Secondary | ICD-10-CM | POA: Insufficient documentation

## 2013-07-29 DIAGNOSIS — Z3202 Encounter for pregnancy test, result negative: Secondary | ICD-10-CM | POA: Insufficient documentation

## 2013-07-29 DIAGNOSIS — F17200 Nicotine dependence, unspecified, uncomplicated: Secondary | ICD-10-CM | POA: Insufficient documentation

## 2013-07-29 LAB — URINALYSIS, ROUTINE W REFLEX MICROSCOPIC
Bilirubin Urine: NEGATIVE
GLUCOSE, UA: NEGATIVE mg/dL
Hgb urine dipstick: NEGATIVE
KETONES UR: NEGATIVE mg/dL
LEUKOCYTES UA: NEGATIVE
Nitrite: NEGATIVE
Protein, ur: NEGATIVE mg/dL
Specific Gravity, Urine: 1.02 (ref 1.005–1.030)
Urobilinogen, UA: 0.2 mg/dL (ref 0.0–1.0)
pH: 7 (ref 5.0–8.0)

## 2013-07-29 LAB — POCT PREGNANCY, URINE: Preg Test, Ur: NEGATIVE

## 2013-07-29 NOTE — MAU Note (Signed)
Pt reports lower abd and lower back pain x 1 week. LMP 06/13/2013, has not done a pregnancy test. Spotting over last 3 days

## 2013-07-30 ENCOUNTER — Encounter (HOSPITAL_COMMUNITY): Payer: Self-pay

## 2013-07-30 ENCOUNTER — Emergency Department (HOSPITAL_COMMUNITY)
Admission: EM | Admit: 2013-07-30 | Discharge: 2013-07-30 | Disposition: A | Discharge status: Home / Self Care | Payer: Medicaid Other | Attending: Emergency Medicine | Admitting: Emergency Medicine

## 2013-07-30 ENCOUNTER — Encounter (HOSPITAL_COMMUNITY): Payer: Self-pay | Admitting: Emergency Medicine

## 2013-07-30 DIAGNOSIS — A599 Trichomoniasis, unspecified: Secondary | ICD-10-CM

## 2013-07-30 DIAGNOSIS — B9689 Other specified bacterial agents as the cause of diseases classified elsewhere: Secondary | ICD-10-CM

## 2013-07-30 DIAGNOSIS — N760 Acute vaginitis: Secondary | ICD-10-CM

## 2013-07-30 LAB — URINALYSIS, ROUTINE W REFLEX MICROSCOPIC
Bilirubin Urine: NEGATIVE
Glucose, UA: NEGATIVE mg/dL
HGB URINE DIPSTICK: NEGATIVE
Ketones, ur: NEGATIVE mg/dL
Leukocytes, UA: NEGATIVE
NITRITE: NEGATIVE
Protein, ur: NEGATIVE mg/dL
Specific Gravity, Urine: 1.022 (ref 1.005–1.030)
UROBILINOGEN UA: 0.2 mg/dL (ref 0.0–1.0)
pH: 7.5 (ref 5.0–8.0)

## 2013-07-30 LAB — WET PREP, GENITAL: YEAST WET PREP: NONE SEEN

## 2013-07-30 LAB — POCT PREGNANCY, URINE: Preg Test, Ur: NEGATIVE

## 2013-07-30 LAB — GC/CHLAMYDIA PROBE AMP
CT PROBE, AMP APTIMA: NEGATIVE
GC Probe RNA: NEGATIVE

## 2013-07-30 MED ORDER — AZITHROMYCIN 250 MG PO TABS
1000.0000 mg | ORAL_TABLET | Freq: Once | ORAL | Status: AC
  Administered 2013-07-30: 1000 mg via ORAL
  Filled 2013-07-30: qty 4

## 2013-07-30 MED ORDER — CEFTRIAXONE SODIUM 250 MG IJ SOLR
250.0000 mg | Freq: Once | INTRAMUSCULAR | Status: AC
  Administered 2013-07-30: 250 mg via INTRAMUSCULAR
  Filled 2013-07-30: qty 250

## 2013-07-30 MED ORDER — KETOROLAC TROMETHAMINE 60 MG/2ML IM SOLN
60.0000 mg | Freq: Once | INTRAMUSCULAR | Status: AC
  Administered 2013-07-30: 60 mg via INTRAMUSCULAR
  Filled 2013-07-30: qty 2

## 2013-07-30 MED ORDER — METRONIDAZOLE 500 MG PO TABS: 500.0000 mg | ORAL_TABLET | Freq: Two times a day (BID) | ORAL | Status: DC

## 2013-07-30 NOTE — ED Notes (Signed)
[facility]; pt states that she had to leave because she had been waiting for 3 hours--- has had urinalysis done.

## 2013-07-30 NOTE — ED Provider Notes (Signed)
Medical screening examination/treatment/procedure(s) were performed by non-physician practitioner and as supervising physician I was immediately available for consultation/collaboration.  Marcellina Jonsson, MD 07/30/13 0635 

## 2013-07-30 NOTE — ED Notes (Signed)
Pt c/o low back pain with low abd cramping with vaginal spotting 3 days ago.

## 2013-07-30 NOTE — Discharge Instructions (Signed)
Take antibiotics to completion for Trichomonas and bacterial vaginosis. You're obligated to inform your partner of positive Trichomonas. You were also treated today for both gonorrhea and Chlamydia. If these tests results positive, you will be contacted and are obligated to inform your partner.  Bacterial Vaginosis Bacterial vaginosis is a vaginal infection that occurs when the normal balance of bacteria in the vagina is disrupted. It results from an overgrowth of certain bacteria. This is the most common vaginal infection in women of childbearing age. Treatment is important to prevent complications, especially in pregnant women, as it can cause a premature delivery. CAUSES  Bacterial vaginosis is caused by an increase in harmful bacteria that are normally present in smaller amounts in the vagina. Several different kinds of bacteria can cause bacterial vaginosis. However, the reason that the condition develops is not fully understood. RISK FACTORS Certain activities or behaviors can put you at an increased risk of developing bacterial vaginosis, including:  Having a new sex partner or multiple sex partners.  Douching.  Using an intrauterine device (IUD) for contraception. Women do not get bacterial vaginosis from toilet seats, bedding, swimming pools, or contact with objects around them. SIGNS AND SYMPTOMS  Some women with bacterial vaginosis have no signs or symptoms. Common symptoms include:  Grey vaginal discharge.  A fishlike odor with discharge, especially after sexual intercourse.  Itching or burning of the vagina and vulva.  Burning or pain with urination. DIAGNOSIS  Your health care provider will take a medical history and examine the vagina for signs of bacterial vaginosis. A sample of vaginal fluid may be taken. Your health care provider will look at this sample under a microscope to check for bacteria and abnormal cells. A vaginal pH test may also be done.  TREATMENT  Bacterial  vaginosis may be treated with antibiotic medicines. These may be given in the form of a pill or a vaginal cream. A second round of antibiotics may be prescribed if the condition comes back after treatment.  HOME CARE INSTRUCTIONS   Only take over-the-counter or prescription medicines as directed by your health care provider.  If antibiotic medicine was prescribed, take it as directed. Make sure you finish it even if you start to feel better.  Do not have sex until treatment is completed.  Tell all sexual partners that you have a vaginal infection. They should see their health care provider and be treated if they have problems, such as a mild rash or itching.  Practice safe sex by using condoms and only having one sex partner. SEEK MEDICAL CARE IF:   Your symptoms are not improving after 3 days of treatment.  You have increased discharge or pain.  You have a fever. MAKE SURE YOU:   Understand these instructions.  Will watch your condition.  Will get help right away if you are not doing well or get worse. FOR MORE INFORMATION  Centers for Disease Control and Prevention, Division of STD Prevention: SolutionApps.co.zawww.cdc.gov/std American Sexual Health Association (ASHA): www.ashastd.org  Document Released: 06/04/2005 Document Revised: 03/25/2013 Document Reviewed: 01/14/2013 [facility] Patient Information 2014 [place], [place].  Trichomoniasis Trichomoniasis is an infection, caused by the Trichomonas organism, that affects both women and men. In women, the outer female genitalia and the vagina are affected. In men, the penis is mainly affected, but the prostate and other reproductive organs can also be involved. Trichomoniasis is a sexually transmitted disease (STD) and is most often passed to another person through sexual contact. The majority of people who  get trichomoniasis do so from a sexual encounter and are also at risk for other STDs. CAUSES   Sexual intercourse with an infected partner.  It  can be present in swimming pools or hot tubs. SYMPTOMS   Abnormal gray-green frothy vaginal discharge in women.  Vaginal itching and irritation in women.  Itching and irritation of the area outside the vagina in women.  Penile discharge with or without pain in males.  Inflammation of the urethra (urethritis), causing painful urination.  Bleeding after sexual intercourse. RELATED COMPLICATIONS  Pelvic inflammatory disease.  Infection of the uterus (endometritis).  Infertility.  Tubal (ectopic) pregnancy.  It can be associated with other STDs, including gonorrhea and chlamydia, hepatitis B, and HIV. COMPLICATIONS DURING PREGNANCY  Early (premature) delivery.  Premature rupture of the membranes (PROM).  Low birth weight. DIAGNOSIS   Visualization of Trichomonas under the microscope from the vagina discharge.  Ph of the vagina greater than 4.5, tested with a test tape.  Trich Rapid Test.  Culture of the organism, but this is not usually needed.  It may be found on a Pap test.  Having a "strawberry cervix,"which means the cervix looks very red like a strawberry. TREATMENT   You may be given medication to fight the infection. Inform your caregiver if you could be or are pregnant. Some medications used to treat the infection should not be taken during pregnancy.  Over-the-counter medications or creams to decrease itching or irritation may be recommended.  Your sexual partner will need to be treated if infected. HOME CARE INSTRUCTIONS   Take all medication prescribed by your caregiver.  Take over-the-counter medication for itching or irritation as directed by your caregiver.  Do not have sexual intercourse while you have the infection.  Do not douche or wear tampons.  Discuss your infection with your partner, as your partner may have acquired the infection from you. Or, your partner may have been the person who transmitted the infection to you.  Have your sex  partner examined and treated if necessary.  Practice safe, informed, and protected sex.  See your caregiver for other STD testing. SEEK MEDICAL CARE IF:   You still have symptoms after you finish the medication.  You have an oral temperature above 102 F (38.9 C).  You develop belly (abdominal) pain.  You have pain when you urinate.  You have bleeding after sexual intercourse.  You develop a rash.  The medication makes you sick or makes you throw up (vomit). Document Released: 11/28/2000 Document Revised: 08/27/2011 Document Reviewed: 12/24/2008 [facility] Patient Information 2014 [place], [place].

## 2013-07-30 NOTE — ED Provider Notes (Signed)
CSN: [id number]     Arrival date & time 07/30/13  0105 History   First MD Initiated Contact with Patient 07/30/13 [id number]     Chief Complaint  Patient presents with  . Abdominal Pain     (Consider location/radiation/quality/duration/timing/severity/associated sxs/prior Treatment) HPI Comments: Patient is 23 year old female who presents to the emergency department complaining of lower abdominal cramping x3 weeks. Patient states pain is intermittent, nothing in specific makes it come or go. Admits to associated vaginal spotting beginning 3 days ago. Last menstrual period was in early December, she has not taken any pregnancy test. She is sexually active with one partner, not on birth control does not use protection. Denies associated nausea or vomiting. Also complaining of low back pain radiating across her lower back intermittently x2 weeks. Nothing in specific makes the pain worse or better. Denies increased urinary frequency, urgency, dysuria, vaginal discharge, fever or chills.  Patient is a 23 y.o. female presenting with abdominal pain. The history is provided by the patient.  Abdominal Pain Associated symptoms: vaginal bleeding (spotting)     Past Medical History  Diagnosis Date  . Migraine   . 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 GERD (gastroesophageal reflux disease)    Past Surgical History  Procedure Laterality Date  . No past surgeries     Family History  Problem Relation Age of Onset  . Anesthesia problems Neg Hx   . Hypertension Mother   . Asthma Brother    History  Substance Use Topics  . Smoking status: Current Every Day Smoker -- 0.25 packs/day    Types: Cigarettes, Cigars  . Smokeless tobacco: Never Used  . Alcohol Use: No   OB History   Grav Para Term Preterm Abortions TAB SAB Ect Mult Living   2 1 1  0 1 1 0 0 0 1     Review of Systems  Gastrointestinal: Positive for abdominal pain.  Genitourinary: Positive for vaginal bleeding (spotting).    Musculoskeletal: Positive for back pain.  All other systems reviewed and are negative.      Allergies  Review of patient's allergies indicates no known allergies.  Home Medications  No current outpatient prescriptions on file. BP 109/81  Pulse 98  Temp(Src) 99 F (37.2 C) (Oral)  Resp 18  Ht 5' (1.524 m)  Wt 160 lb (72.576 kg)  BMI 31.25 kg/m2  SpO2 99%  LMP 06/13/2013 Physical Exam  Nursing note and vitals reviewed. Constitutional: She is oriented to person, place, and time. She appears well-developed and well-nourished. No distress.  HENT:  Head: Normocephalic and atraumatic.  Mouth/Throat: Oropharynx is clear and moist.  Eyes: Conjunctivae are normal.  Neck: Normal range of motion. Neck supple.  Cardiovascular: Normal rate, regular rhythm and normal heart sounds.   Pulmonary/Chest: Effort normal and breath sounds normal.  Abdominal: Soft. Bowel sounds are normal. There is tenderness in the suprapubic area. There is no rigidity, no rebound, no guarding and no CVA tenderness.  No peritoneal signs.  Genitourinary: Uterus normal. Cervix exhibits no motion tenderness, no discharge and no friability. Right adnexum displays no mass, no tenderness and no fullness. Left adnexum displays no mass, no tenderness and no fullness. No erythema or bleeding around the vagina. Vaginal discharge (white, malodorous) found.  Musculoskeletal: Normal range of motion. She exhibits no edema.       Back:  Neurological: She is alert and oriented to person, place, and time. She has normal strength. No sensory deficit.  Skin: Skin is warm and dry. She is not diaphoretic.  Psychiatric: She has a normal mood and affect. Her behavior is normal.    ED Course  Procedures (including critical care time) Labs Review Labs Reviewed - No data to display Imaging Review No results found.  EKG Interpretation   None       MDM   Final diagnoses:  None    Patient presenting with abdominal pain,  vaginal spotting. She is well appearing and in no apparent distress. Vital signs stable. No peritoneal signs. On pelvic exam, large amount of thick white discharge, malodorous. No CMT or adnexal tenderness. Wet prep showing many trichomoniasis and clue cells. GC/Chlamydia cultures pending. Patient will be prophylactically treated for GC/Chlamydia. Treat Trichomonas and BV with Flagyl.  Patient is stable for discharge, infection care precautions discussed. Safe sexual practices discussed. Return precautions given. Patient states understanding of treatment care plan and is agreeable.    Trevor Mace, PA-C 07/30/13 [id number]

## 2013-10-12 ENCOUNTER — Inpatient Hospital Stay (HOSPITAL_COMMUNITY)
Admission: AD | Admit: 2013-10-12 | Discharge: 2013-10-13 | Disposition: A | Discharge status: Home / Self Care | Payer: Medicaid Other | Source: Ambulatory Visit | Attending: Family Medicine | Admitting: Family Medicine

## 2013-10-12 DIAGNOSIS — R200 Anesthesia of skin: Secondary | ICD-10-CM

## 2013-10-12 DIAGNOSIS — O26899 Other specified pregnancy related conditions, unspecified trimester: Secondary | ICD-10-CM

## 2013-10-12 DIAGNOSIS — R209 Unspecified disturbances of skin sensation: Secondary | ICD-10-CM | POA: Insufficient documentation

## 2013-10-12 DIAGNOSIS — O2390 Unspecified genitourinary tract infection in pregnancy, unspecified trimester: Secondary | ICD-10-CM | POA: Insufficient documentation

## 2013-10-12 DIAGNOSIS — R109 Unspecified abdominal pain: Secondary | ICD-10-CM | POA: Insufficient documentation

## 2013-10-12 DIAGNOSIS — K219 Gastro-esophageal reflux disease without esophagitis: Secondary | ICD-10-CM | POA: Insufficient documentation

## 2013-10-12 DIAGNOSIS — N760 Acute vaginitis: Secondary | ICD-10-CM | POA: Insufficient documentation

## 2013-10-12 DIAGNOSIS — M549 Dorsalgia, unspecified: Secondary | ICD-10-CM | POA: Insufficient documentation

## 2013-10-12 DIAGNOSIS — Z3490 Encounter for supervision of normal pregnancy, unspecified, unspecified trimester: Secondary | ICD-10-CM

## 2013-10-12 DIAGNOSIS — A499 Bacterial infection, unspecified: Secondary | ICD-10-CM | POA: Insufficient documentation

## 2013-10-12 DIAGNOSIS — F17200 Nicotine dependence, unspecified, uncomplicated: Secondary | ICD-10-CM | POA: Insufficient documentation

## 2013-10-12 DIAGNOSIS — B9689 Other specified bacterial agents as the cause of diseases classified elsewhere: Secondary | ICD-10-CM | POA: Insufficient documentation

## 2013-10-12 DIAGNOSIS — R202 Paresthesia of skin: Secondary | ICD-10-CM

## 2013-10-13 ENCOUNTER — Encounter (HOSPITAL_COMMUNITY): Payer: Self-pay | Admitting: *Deleted

## 2013-10-13 ENCOUNTER — Inpatient Hospital Stay (HOSPITAL_COMMUNITY): Payer: Medicaid Other

## 2013-10-13 DIAGNOSIS — O9989 Other specified diseases and conditions complicating pregnancy, childbirth and the puerperium: Secondary | ICD-10-CM

## 2013-10-13 DIAGNOSIS — R109 Unspecified abdominal pain: Secondary | ICD-10-CM

## 2013-10-13 LAB — WET PREP, GENITAL
Trich, Wet Prep: NONE SEEN
Yeast Wet Prep HPF POC: NONE SEEN

## 2013-10-13 LAB — URINALYSIS, ROUTINE W REFLEX MICROSCOPIC
Bilirubin Urine: NEGATIVE
Glucose, UA: NEGATIVE mg/dL
HGB URINE DIPSTICK: NEGATIVE
Ketones, ur: NEGATIVE mg/dL
Nitrite: NEGATIVE
PH: 7 (ref 5.0–8.0)
Protein, ur: NEGATIVE mg/dL
SPECIFIC GRAVITY, URINE: 1.02 (ref 1.005–1.030)
Urobilinogen, UA: 0.2 mg/dL (ref 0.0–1.0)

## 2013-10-13 LAB — URINE MICROSCOPIC-ADD ON

## 2013-10-13 LAB — CBC
HCT: 33.2 % — ABNORMAL LOW (ref 36.0–46.0)
Hemoglobin: 11.7 g/dL — ABNORMAL LOW (ref 12.0–15.0)
MCH: 29.4 pg (ref 26.0–34.0)
MCHC: 35.2 g/dL (ref 30.0–36.0)
MCV: 83.4 fL (ref 78.0–100.0)
Platelets: 288 10*3/uL (ref 150–400)
RBC: 3.98 MIL/uL (ref 3.87–5.11)
RDW: 13.6 % (ref 11.5–15.5)
WBC: 9.5 10*3/uL (ref 4.0–10.5)

## 2013-10-13 LAB — POCT PREGNANCY, URINE: PREG TEST UR: POSITIVE — AB

## 2013-10-13 LAB — HCG, QUANTITATIVE, PREGNANCY: hCG, Beta Chain, Quant, S: 30582 m[IU]/mL — ABNORMAL HIGH (ref <5)

## 2013-10-13 LAB — ABO/RH: ABO/RH(D): A POS

## 2013-10-13 MED ORDER — METRONIDAZOLE 500 MG PO TABS: 500.0000 mg | ORAL_TABLET | Freq: Two times a day (BID) | ORAL | Status: DC

## 2013-10-13 MED ORDER — GI COCKTAIL ~~LOC~~
30.0000 mL | Freq: Once | ORAL | Status: AC
  Administered 2013-10-13: 30 mL via ORAL
  Filled 2013-10-13: qty 30

## 2013-10-13 NOTE — MAU Note (Addendum)
Cramping in lower abdomen with white discharge that started about a week ago.  Pt states "Feels like I can't get all of my pee out sometimes".  Lower back pain started a week ago as well.  Also reports numbness that goes down her legs and feet.

## 2013-10-13 NOTE — MAU Provider Note (Signed)
Chief Complaint: Back Pain and Abdominal Pain  First Provider Initiated Contact with Patient 10/13/13 0106     SUBJECTIVE HPI: Stacey [name] is a 23 y.o. G3P1011 at Unknown gestational age who presents with low abdominal cramping, mid low back pain, sensation of incomplete emptying of bladder and occasional numbness or pain down legs bilaterally x1 week. Patient thinks she may be about 1 month pregnant. Negative UPT in emergency Department 07/30/2013. No Testing this pregnancy. Rates pain 8/10 on pain at its worst. Less now. Has not tried anything for the pain. No aggravating or alleviating factors for pain and numbness.  Past Medical History  Diagnosis Date  . Migraine   . 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 GERD (gastroesophageal reflux disease)    OB History  Gravida Para Term Preterm AB SAB TAB Ectopic Multiple Living  3 1 1  0 1 0 1 0 0 1    # Outcome Date GA Lbr Len/2nd Weight Sex Delivery Anes PTL Lv  3 CUR           2 TRM 11/02/10    F SVD   Y     Comments: post partum pre eclampsia  1 TAB              Comments: System Generated. Please review and update pregnancy details.     Past Surgical History  Procedure Laterality Date  . No past surgeries     History   Social History  . Marital Status: Single    Spouse Name: N/A    Number of Children: N/A  . Years of Education: N/A   Occupational History  . Not on file.   Social History Main Topics  . Smoking status: Current Every Day Smoker -- 0.25 packs/day    Types: Cigarettes, Cigars  . Smokeless tobacco: Never Used  . Alcohol Use: No  . Drug Use: No  . Sexual Activity: Yes    Birth Control/ Protection: None   Other Topics Concern  . Not on file   Social History Narrative  . No narrative on file   No current facility-administered medications on file prior to encounter.   No current outpatient prescriptions on file prior to encounter.   No Known Allergies  ROS: Positive for low abdominal cramping,  low back pain, numbness radiating down both legs, reflux, secondary amenorrhea. Negative for fever, chills, nausea, vomiting, diarrhea, constipation, dysuria, urgency, frequency, flank pain, weakness, difficulties with speech or gait, vaginal bleeding or vaginal discharge.  OBJECTIVE Blood pressure 120/65, pulse 97, temperature 99 F (37.2 C), temperature source Oral, resp. rate 18, height 5' 1.5" (1.562 m), weight 75.297 kg (166 lb), last menstrual period 06/13/2013. GENERAL: Well-developed, well-nourished female in no acute distress. Normal gait.  HEENT: Normocephalic HEART: normal rate RESP: normal effort ABDOMEN: Soft, non-tender. Fundus nonpalpable. Normal bowel sounds x4. No CVA tenderness. Negative rebound tenderness or mass. EXTREMITIES: Nontender, no edema. Grossly normal sensation bilaterally. NEURO: Alert and oriented SPECULUM EXAM: NEFG, moderate amount of creamy, white, malodorous discharge, no blood noted, cervix clean BIMANUAL: cervix close; difficult to assess uterine size to 2 retroverted position and maternal body habitus. Not obviously enlarged. Mild bilateral adnexal tenderness. No masses. Mild cervical motion tenderness.  LAB RESULTS Results for orders placed during the hospital encounter of 10/12/13 (from the past 24 hour(s))  URINALYSIS, ROUTINE W REFLEX MICROSCOPIC     Status: Abnormal   Collection Time    10/12/13 11:49 PM  Result Value Ref Range   Color, Urine YELLOW  YELLOW   APPearance CLEAR  CLEAR   Specific Gravity, Urine 1.020  1.005 - 1.030   pH 7.0  5.0 - 8.0   Glucose, UA NEGATIVE  NEGATIVE mg/dL   Hgb urine dipstick NEGATIVE  NEGATIVE   Bilirubin Urine NEGATIVE  NEGATIVE   Ketones, ur NEGATIVE  NEGATIVE mg/dL   Protein, ur NEGATIVE  NEGATIVE mg/dL   Urobilinogen, UA 0.2  0.0 - 1.0 mg/dL   Nitrite NEGATIVE  NEGATIVE   Leukocytes, UA SMALL (*) NEGATIVE  URINE MICROSCOPIC-ADD ON     Status: None   Collection Time    10/12/13 11:49 PM      Result  Value Ref Range   Squamous Epithelial / LPF RARE  RARE   WBC, UA 0-2  <3 WBC/hpf   RBC / HPF 0-2  <3 RBC/hpf   Bacteria, UA RARE  RARE  POCT PREGNANCY, URINE     Status: Abnormal   Collection Time    10/13/13 12:09 AM      Result Value Ref Range   Preg Test, Ur POSITIVE (*) NEGATIVE  ABO/RH     Status: None   Collection Time    10/13/13 12:45 AM      Result Value Ref Range   ABO/RH(D) A POS    HCG, QUANTITATIVE, PREGNANCY     Status: Abnormal   Collection Time    10/13/13 12:45 AM      Result Value Ref Range   hCG, Beta Nyra JabsChain, Quant, [place] [id number] (*) <5 mIU/mL  CBC     Status: Abnormal   Collection Time    10/13/13 12:45 AM      Result Value Ref Range   WBC 9.5  4.0 - 10.5 K/uL   RBC 3.98  3.87 - 5.11 MIL/uL   Hemoglobin 11.7 (*) 12.0 - 15.0 g/dL   HCT 60.433.2 (*) 54.036.0 - 98.146.0 %   MCV 83.4  78.0 - 100.0 fL   MCH 29.4  26.0 - 34.0 pg   MCHC 35.2  30.0 - 36.0 g/dL   RDW 19.113.6  47.811.5 - 29.515.5 %   Platelets 288  150 - 400 K/uL  WET PREP, GENITAL     Status: Abnormal   Collection Time    10/13/13  1:15 AM      Result Value Ref Range   Yeast Wet Prep HPF POC NONE SEEN  NONE SEEN   Trich, Wet Prep NONE SEEN  NONE SEEN   Clue Cells Wet Prep HPF POC MODERATE (*) NONE SEEN   WBC, Wet Prep HPF POC FEW (*) NONE SEEN    IMAGING [place] Ob Comp Less 14 Wks  10/13/2013   CLINICAL DATA:  Lower abdominal and back pain.  EXAM: OBSTETRIC <14 WK US AND TRANSVAGINAL OB US  TECHNIQUE: Both transabdominal and transvaginal ultrasound examinations were performed for complete evaluation of the gestation as well as the maternal uterus, adnexal regions, and pelvic cul-de-sac. Transvaginal technique was performed to assess early pregnancy.  COMPARISON:  CT of the abdomen and pelvis performed 05/05/2013, and pelvic ultrasound performed 04/30/2013  FINDINGS: Intrauterine gestational sac: Visualized/normal in shape.  Yolk sac:  Yes  Embryo:  Yes  Cardiac Activity: Yes  Heart Rate: 158 bpm  CRL:   19.2  mm   8 w 4 d                   US EDC: 05/21/2014  Maternal uterus/adnexae:  No subchorionic hemorrhage is noted. The uterus is otherwise unremarkable in appearance.  The right ovary measures 3.8 x 2.0 x 2.1 cm, while the left ovary measures 4.1 x 1.8 x 2.3 cm. No suspicious adnexal masses are seen; there is no evidence for ovarian torsion.  No free fluid is seen within the pelvic cul-de-sac.  IMPRESSION: Single live intrauterine pregnancy noted, with a crown-rump length of 1.9 cm, corresponding to a gestational age of [redacted] weeks 4 days. This does not match the gestational age by LMP, and reflects a new estimated date of delivery of December 4th, 2015.   Electronically Signed   By: Roanna Raider M.D.   On: 10/13/2013 01:58   US Ob Transvaginal  10/13/2013   CLINICAL DATA:  Lower abdominal and back pain.  EXAM: OBSTETRIC <14 WK [place] AND TRANSVAGINAL OB US  TECHNIQUE: Both transabdominal and transvaginal ultrasound examinations were performed for complete evaluation of the gestation as well as the maternal uterus, adnexal regions, and pelvic cul-de-sac. Transvaginal technique was performed to assess early pregnancy.  COMPARISON:  CT of the abdomen and pelvis performed 05/05/2013, and pelvic ultrasound performed 04/30/2013  FINDINGS: Intrauterine gestational sac: Visualized/normal in shape.  Yolk sac:  Yes  Embryo:  Yes  Cardiac Activity: Yes  Heart Rate: 158 bpm  CRL:   19.2  mm   8 w 4 d                  [place] EDC: 05/21/2014  Maternal uterus/adnexae: No subchorionic hemorrhage is noted. The uterus is otherwise unremarkable in appearance.  The right ovary measures 3.8 x 2.0 x 2.1 cm, while the left ovary measures 4.1 x 1.8 x 2.3 cm. No suspicious adnexal masses are seen; there is no evidence for ovarian torsion.  No free fluid is seen within the pelvic cul-de-sac.  IMPRESSION: Single live intrauterine pregnancy noted, with a crown-rump length of 1.9 cm, corresponding to a gestational age of [redacted] weeks 4 days. This does not match the gestational  age by LMP, and reflects a new estimated date of delivery of December 4th, 2015.   Electronically Signed   By: Roanna Raider M.D.   On: 10/13/2013 01:58   MAU COURSE  ASSESSMENT 1. Abdominal pain in pregnancy, antepartum   2. BV (bacterial vaginosis)   3. Pregnancy with uncertain dates   4. Numbness and tingling of both legs-doubt emergent neurological condition.    PLAN Discharge home in stable condition. Urine culture and GC/chlamydia cultures pending. Comfort measures. Pregnancy verification letter given. List of providers given. Followup with primary care provider regarding leg numbness that does not resolve with position change. Neurological red flags reviewed.     Follow-up Information   Follow up with [facility].      Follow up with [facility]. (As needed in emergencies)    Contact information:   [address] [id number] [place] [address] [phone]       Medication List         metroNIDAZOLE 500 MG tablet  Commonly known as:  FLAGYL  Take 1 tablet (500 mg total) by mouth 2 (two) times daily. One po bid x 7 days       [place], [place] 10/13/2013  2:14 AM

## 2013-10-13 NOTE — Discharge Instructions (Signed)
Pregnancy - First Trimester During sexual intercourse, millions of sperm go into the vagina. Only 1 sperm will penetrate and fertilize the female egg while it is in the Fallopian tube. One week later, the fertilized egg implants into the wall of the uterus. An embryo begins to develop into a baby. At 6 to 8 weeks, the eyes and face are formed and the heartbeat can be seen on ultrasound. At the end of 12 weeks (first trimester), all the baby's organs are formed. Now that you are pregnant, you will want to do everything you can to have a healthy baby. Two of the most important things are to get good prenatal care and follow your caregiver's instructions. Prenatal care is all the medical care you receive before the baby's birth. It is given to prevent, find, and treat problems during the pregnancy and childbirth. PRENATAL EXAMS  During prenatal visits, your weight, blood pressure, and urine are checked. This is done to make sure you are healthy and progressing normally during the pregnancy.  A pregnant woman should gain 25 to 35 pounds during the pregnancy. However, if you are overweight or underweight, your caregiver will advise you regarding your weight.  Your caregiver will ask and answer questions for you.  Blood work, cervical cultures, other necessary tests, and a Pap test are done during your prenatal exams. These tests are done to check on your health and the probable health of your baby. Tests are strongly recommended and done for HIV with your permission. This is the virus that causes AIDS. These tests are done because medicines can be given to help prevent your baby from being born with this infection should you have been infected without knowing it. Blood work is also used to find out your blood type, previous infections, and follow your blood levels (hemoglobin).  Low hemoglobin (anemia) is common during pregnancy. Iron and vitamins are given to help prevent this. Later in the pregnancy, blood  tests for diabetes will be done along with any other tests if any problems develop.  You may need other tests to make sure you and the baby are doing well. CHANGES DURING THE FIRST TRIMESTER  Your body goes through many changes during pregnancy. They vary from person to person. Talk to your caregiver about changes you notice and are concerned about. Changes can include:  Your menstrual period stops.  The egg and sperm carry the genes that determine what you look like. Genes from you and your partner are forming a baby. The female genes determine whether the baby is a boy or a girl.  Your body increases in girth and you may feel bloated.  Feeling sick to your stomach (nauseous) and throwing up (vomiting). If the vomiting is uncontrollable, call your caregiver.  Your breasts will begin to enlarge and become tender.  Your nipples may stick out more and become darker.  The need to urinate more. Painful urination may mean you have a bladder infection.  Tiring easily.  Loss of appetite.  Cravings for certain kinds of food.  At first, you may gain or lose a couple of pounds.  You may have changes in your emotions from day to day (excited to be pregnant or concerned something may go wrong with the pregnancy and baby).  You may have more vivid and strange dreams. HOME CARE INSTRUCTIONS   It is very important to avoid all smoking, alcohol and non-prescribed drugs during your pregnancy. These affect the formation and growth of the baby.  Avoid chemicals while pregnant to ensure the delivery of a healthy infant. °· Start your prenatal visits by the 12th week of pregnancy. They are usually scheduled monthly at first, then more often in the last 2 months before delivery. Keep your caregiver's appointments. Follow your caregiver's instructions regarding medicine use, blood and lab tests, exercise, and diet. °· During pregnancy, you are providing food for you and your baby. Eat regular, well-balanced  meals. Choose foods such as meat, fish, milk and other low fat dairy products, vegetables, fruits, and whole-grain breads and cereals. Your caregiver will tell you of the ideal weight gain. °· You can help morning sickness by keeping soda crackers at the bedside. Eat a couple before arising in the morning. You may want to use the crackers without salt on them. °· Eating 4 to 5 small meals rather than 3 large meals a day also may help the nausea and vomiting. °· Drinking liquids between meals instead of during meals also seems to help nausea and vomiting. °· A physical sexual relationship may be continued throughout pregnancy if there are no other problems. Problems may be early (premature) leaking of amniotic fluid from the membranes, vaginal bleeding, or belly (abdominal) pain. °· Exercise regularly if there are no restrictions. Check with your caregiver or physical therapist if you are unsure of the safety of some of your exercises. Greater weight gain will occur in the last 2 trimesters of pregnancy. Exercising will help: °· Control your weight. °· Keep you in shape. °· Prepare you for labor and delivery. °· Help you lose your pregnancy weight after you deliver your baby. °· Wear a good support or jogging bra for breast tenderness during pregnancy. This may help if worn during sleep too. °· Ask when prenatal classes are available. Begin classes when they are offered. °· Do not use hot tubs, steam rooms, or saunas. °· Wear your seat belt when driving. This protects you and your baby if you are in an accident. °· Avoid raw meat, uncooked cheese, cat litter boxes, and soil used by cats throughout the pregnancy. These carry germs that can cause birth defects in the baby. °· The first trimester is a good time to visit your dentist for your dental health. Getting your teeth cleaned is okay. Use a softer toothbrush and brush gently during pregnancy. °· Ask for help if you have financial, counseling, or nutritional needs  during pregnancy. Your caregiver will be able to offer counseling for these needs as well as refer you for other special needs. °· Do not take any medicines or herbs unless told by your caregiver. °· Inform your caregiver if there is any mental or physical domestic violence. °· Make a list of emergency phone numbers of family, friends, hospital, and police and fire departments. °· Write down your questions. Take them to your prenatal visit. °· Do not douche. °· Do not cross your legs. °· If you have to stand for long periods of time, rotate you feet or take small steps in a circle. °· You may have more vaginal secretions that may require a sanitary pad. Do not use tampons or scented sanitary pads. °MEDICINES AND DRUG USE IN PREGNANCY °· Take prenatal vitamins as directed. The vitamin should contain 1 milligram of folic acid. Keep all vitamins out of reach of children. Only a couple vitamins or tablets containing iron may be fatal to a baby or young child when ingested. °· Avoid use of all medicines, including herbs, over-the-counter medicines, not   prescribed or suggested by your caregiver. Only take over-the-counter or prescription medicines for pain, discomfort, or fever as directed by your caregiver. Do not use aspirin, ibuprofen, or naproxen unless directed by your caregiver.  Let your caregiver also know about herbs you may be using.  Alcohol is related to a number of birth defects. This includes fetal alcohol syndrome. All alcohol, in any form, should be avoided completely. Smoking will cause low birth rate and premature babies.  Street or illegal drugs are very harmful to the baby. They are absolutely forbidden. A baby born to an addicted mother will be addicted at birth. The baby will go through the same withdrawal an adult does.  Let your caregiver know about any medicines that you have to take and for what reason you take them. SEEK MEDICAL CARE IF:  You have any concerns or worries during your  pregnancy. It is better to call with your questions if you feel they cannot wait, rather than worry about them. SEEK IMMEDIATE MEDICAL CARE IF:   An unexplained oral temperature above 102 F (38.9 C) develops, or as your caregiver suggests.  You have leaking of fluid from the vagina (birth canal). If leaking membranes are suspected, take your temperature and inform your caregiver of this when you call.  There is vaginal spotting or bleeding. Notify your caregiver of the amount and how many pads are used.  You develop a bad smelling vaginal discharge with a change in the color.  You continue to feel sick to your stomach (nauseated) and have no relief from remedies suggested. You vomit blood or coffee ground-like materials.  You lose more than 2 pounds of weight in 1 week.  You gain more than 2 pounds of weight in 1 week and you notice swelling of your face, hands, feet, or legs.  You gain 5 pounds or more in 1 week (even if you do not have swelling of your hands, face, legs, or feet).  You get exposed to [place] measles and have never had them.  You are exposed to fifth disease or chickenpox.  You develop belly (abdominal) pain. Round ligament discomfort is a common non-cancerous (benign) cause of abdominal pain in pregnancy. Your caregiver still must evaluate this.  You develop headache, fever, diarrhea, pain with urination, or shortness of breath.  You fall or are in a car accident or have any kind of trauma.  There is mental or physical violence in your home. Document Released: 05/29/2001 Document Revised: 02/27/2012 Document Reviewed: 11/30/2008 [facility] Patient Information 2014 [place], [place].  Bacterial Vaginosis Bacterial vaginosis is a vaginal infection that occurs when the normal balance of bacteria in the vagina is disrupted. It results from an overgrowth of certain bacteria. This is the most common vaginal infection in women of childbearing age. Treatment is important to  prevent complications, especially in pregnant women, as it can cause a premature delivery. CAUSES  Bacterial vaginosis is caused by an increase in harmful bacteria that are normally present in smaller amounts in the vagina. Several different kinds of bacteria can cause bacterial vaginosis. However, the reason that the condition develops is not fully understood. RISK FACTORS Certain activities or behaviors can put you at an increased risk of developing bacterial vaginosis, including:  Having a new sex partner or multiple sex partners.  Douching.  Using an intrauterine device (IUD) for contraception. Women do not get bacterial vaginosis from toilet seats, bedding, swimming pools, or contact with objects around them. SIGNS AND SYMPTOMS  Some  women with bacterial vaginosis have no signs or symptoms. Common symptoms include:  Grey vaginal discharge.  A fishlike odor with discharge, especially after sexual intercourse.  Itching or burning of the vagina and vulva.  Burning or pain with urination. DIAGNOSIS  Your health care provider will take a medical history and examine the vagina for signs of bacterial vaginosis. A sample of vaginal fluid may be taken. Your health care provider will look at this sample under a microscope to check for bacteria and abnormal cells. A vaginal pH test may also be done.  TREATMENT  Bacterial vaginosis may be treated with antibiotic medicines. These may be given in the form of a pill or a vaginal cream. A second round of antibiotics may be prescribed if the condition comes back after treatment.  HOME CARE INSTRUCTIONS   Only take over-the-counter or prescription medicines as directed by your health care provider.  If antibiotic medicine was prescribed, take it as directed. Make sure you finish it even if you start to feel better.  Do not have sex until treatment is completed.  Tell all sexual partners that you have a vaginal infection. They should see their  health care provider and be treated if they have problems, such as a mild rash or itching.  Practice safe sex by using condoms and only having one sex partner. SEEK MEDICAL CARE IF:   Your symptoms are not improving after 3 days of treatment.  You have increased discharge or pain.  You have a fever. MAKE SURE YOU:   Understand these instructions.  Will watch your condition.  Will get help right away if you are not doing well or get worse. FOR MORE INFORMATION  Centers for Disease Control and Prevention, Division of STD Prevention: SolutionApps.co.zawww.cdc.gov/std American Sexual Health Association (ASHA): www.ashastd.org  Document Released: 06/04/2005 Document Revised: 03/25/2013 Document Reviewed: 01/14/2013 [facility] Patient Information 2014 [place], [place].  Prenatal Care [facility]    [facility]  [phone]     Phone: [phone]          [facility]                      [facility] [place]  @[place]  [place]     Phone: [phone]  Phone: [phone]         [facility]     Phone: [phone]  Phone: [phone]           [facility] [place]                hone: [phone]  Phone: [phone]         [facility] [address] Francoise CeoBernard [facility]      Phone: [phone]  Phone: [phone]         [facility]                Phone: [phone]  [facility]   [address][phone]    [facility]([place])          Phone: [phone] [facility]   Phone: [phone]

## 2013-10-14 ENCOUNTER — Telehealth: Payer: Self-pay | Admitting: *Deleted

## 2013-10-14 DIAGNOSIS — A749 Chlamydial infection, unspecified: Secondary | ICD-10-CM

## 2013-10-14 LAB — GC/CHLAMYDIA PROBE AMP
CT Probe RNA: POSITIVE — AB
GC PROBE AMP APTIMA: NEGATIVE

## 2013-10-14 MED ORDER — AZITHROMYCIN 250 MG PO TABS: 1000.0000 mg | ORAL_TABLET | Freq: Once | ORAL | Status: DC

## 2013-10-14 NOTE — Telephone Encounter (Signed)
Message copied by Mannie StabileASH, AMANDA A on Wed Oct 14, 2013  9:09 AM ------      Message from: BertramSMITH, MARNI W      Created: Wed Oct 14, 2013  8:57 AM       Telephone call to patient regarding positive chlamydia culture, patient notified.  Patient has not been treated and [facility] [address]   Instructed patient to notify her partner for treatment.  Report faxed to health department. ------

## 2013-10-14 NOTE — MAU Provider Note (Signed)
Attestation of Attending Supervision of Advanced Practitioner (PA/CNM/NP): Evaluation and management procedures were performed by the Advanced Practitioner under my supervision and collaboration.  I have reviewed the Advanced Practitioner's note and chart, and I agree with the management and plan.  Reva Boresanya S Daeshawn Redmann, MD [facility] Attending 10/14/2013 8:46 AM

## 2013-10-14 NOTE — Telephone Encounter (Signed)
Rx sent to [facility]

## 2013-12-16 ENCOUNTER — Encounter (HOSPITAL_COMMUNITY): Payer: Self-pay

## 2013-12-16 ENCOUNTER — Inpatient Hospital Stay (HOSPITAL_COMMUNITY)
Admission: AD | Admit: 2013-12-16 | Discharge: 2013-12-16 | Disposition: A | Discharge status: Home / Self Care | Payer: Medicaid Other | Source: Ambulatory Visit | Attending: Family Medicine | Admitting: Family Medicine

## 2013-12-16 DIAGNOSIS — F17200 Nicotine dependence, unspecified, uncomplicated: Secondary | ICD-10-CM | POA: Insufficient documentation

## 2013-12-16 DIAGNOSIS — M549 Dorsalgia, unspecified: Secondary | ICD-10-CM | POA: Insufficient documentation

## 2013-12-16 DIAGNOSIS — B9689 Other specified bacterial agents as the cause of diseases classified elsewhere: Secondary | ICD-10-CM

## 2013-12-16 DIAGNOSIS — R109 Unspecified abdominal pain: Secondary | ICD-10-CM | POA: Insufficient documentation

## 2013-12-16 DIAGNOSIS — N760 Acute vaginitis: Secondary | ICD-10-CM | POA: Insufficient documentation

## 2013-12-16 DIAGNOSIS — K219 Gastro-esophageal reflux disease without esophagitis: Secondary | ICD-10-CM | POA: Insufficient documentation

## 2013-12-16 DIAGNOSIS — A499 Bacterial infection, unspecified: Secondary | ICD-10-CM | POA: Insufficient documentation

## 2013-12-16 LAB — URINALYSIS, ROUTINE W REFLEX MICROSCOPIC
BILIRUBIN URINE: NEGATIVE
GLUCOSE, UA: NEGATIVE mg/dL
KETONES UR: NEGATIVE mg/dL
LEUKOCYTES UA: NEGATIVE
Nitrite: NEGATIVE
Protein, ur: NEGATIVE mg/dL
Specific Gravity, Urine: 1.025 (ref 1.005–1.030)
Urobilinogen, UA: 0.2 mg/dL (ref 0.0–1.0)
pH: 5.5 (ref 5.0–8.0)

## 2013-12-16 LAB — URINE MICROSCOPIC-ADD ON

## 2013-12-16 LAB — POCT PREGNANCY, URINE: Preg Test, Ur: NEGATIVE

## 2013-12-16 LAB — WET PREP, GENITAL
Trich, Wet Prep: NONE SEEN
YEAST WET PREP: NONE SEEN

## 2013-12-16 MED ORDER — METRONIDAZOLE 500 MG PO TABS: 500.0000 mg | ORAL_TABLET | Freq: Two times a day (BID) | ORAL | Status: AC

## 2013-12-16 NOTE — Discharge Instructions (Signed)
Safe Sex Safe sex is about reducing the risk of giving or getting a sexually transmitted disease (STD). STDs are spread through sexual contact involving the genitals, mouth, or rectum. Some STDs can be cured and others cannot. Safe sex can also prevent unintended pregnancies.  WHAT ARE SOME SAFE SEX PRACTICES?  Limit your sexual activity to only one partner who is only having sex with you.  Talk to your partner about his or her past partners, past STDs, and drug use.  Use a condom every time you have sexual intercourse. This includes vaginal, oral, and anal sexual activity. Both females and males should wear condoms during oral sex. Only use latex or polyurethane condoms and water-based lubricants. Using petroleum-based lubricants or oils to lubricate a condom will weaken the condom and increase the chance that it will break. The condom should be in place from the beginning to the end of sexual activity. Wearing a condom reduces, but does not completely eliminate, your risk of getting or giving an STD. STDs can be spread by contact with infected body fluids and skin.  Get vaccinated for hepatitis B and HPV.  Avoid alcohol and recreational drugs which can affect your judgement. You may forget to use a condom or participate in high-risk sex.  For females, avoid douching after sexual intercourse. Douching can spread an infection farther into the reproductive tract.  Check your body for signs of sores, blisters, rashes, or unusual discharge. See your health care provider if you notice any of these signs.  Avoid sexual contact if you have symptoms of an infection or are being treated for an STD. If you or your partner has herpes, avoid sexual contact when blisters are present. Use condoms at all other times.  If you are at risk of being infected with HIV, it is recommended that you take a prescription medicine daily to prevent HIV infection. This is called pre-exposure prophylaxis (PrEP). You are  considered at risk if:  You are a man who has sex with other men (MSM).  You are a heterosexual man or woman who is sexually active with more than one partner.  You take drugs by injection.  You are sexually active with a partner who has HIV.  Talk with your health care provider about whether you are at high risk of being infected with HIV. If you choose to begin PrEP, you should first be tested for HIV. You should then be tested every 3 months for as long as you are taking PrEP.  See your health care provider for regular screenings, exams, and tests for other STDs. Before having sex with a new partner, each of you should be screened for STDs and should talk about the results with each other. WHAT ARE THE BENEFITS OF SAFE SEX?   There is less chance of getting or giving an STD.  You can prevent unwanted or unintended pregnancies.  By discussing safe sex concerns with your partner, you may increase feelings of intimacy, comfort, trust, and honesty between the two of you. Document Released: 07/12/2004 Document Revised: 06/09/2013 Document Reviewed: 11/26/2011 [facility] Patient Information 2015 [place], [place]. This information is not intended to replace advice given to you by your health care provider. Make sure you discuss any questions you have with your health care provider. Bacterial Vaginosis Bacterial vaginosis is a vaginal infection that occurs when the normal balance of bacteria in the vagina is disrupted. It results from an overgrowth of certain bacteria. This is the most common vaginal infection  in women of childbearing age. Treatment is important to prevent complications, especially in pregnant women, as it can cause a premature delivery. CAUSES  Bacterial vaginosis is caused by an increase in harmful bacteria that are normally present in smaller amounts in the vagina. Several different kinds of bacteria can cause bacterial vaginosis. However, the reason that the condition develops  is not fully understood. RISK FACTORS Certain activities or behaviors can put you at an increased risk of developing bacterial vaginosis, including:  Having a new sex partner or multiple sex partners.  Douching.  Using an intrauterine device (IUD) for contraception. Women do not get bacterial vaginosis from toilet seats, bedding, swimming pools, or contact with objects around them. SIGNS AND SYMPTOMS  Some women with bacterial vaginosis have no signs or symptoms. Common symptoms include:  Grey vaginal discharge.  A fishlike odor with discharge, especially after sexual intercourse.  Itching or burning of the vagina and vulva.  Burning or pain with urination. DIAGNOSIS  Your health care provider will take a medical history and examine the vagina for signs of bacterial vaginosis. A sample of vaginal fluid may be taken. Your health care provider will look at this sample under a microscope to check for bacteria and abnormal cells. A vaginal pH test may also be done.  TREATMENT  Bacterial vaginosis may be treated with antibiotic medicines. These may be given in the form of a pill or a vaginal cream. A second round of antibiotics may be prescribed if the condition comes back after treatment.  HOME CARE INSTRUCTIONS   Only take over-the-counter or prescription medicines as directed by your health care provider.  If antibiotic medicine was prescribed, take it as directed. Make sure you finish it even if you start to feel better.  Do not have sex until treatment is completed.  Tell all sexual partners that you have a vaginal infection. They should see their health care provider and be treated if they have problems, such as a mild rash or itching.  Practice safe sex by using condoms and only having one sex partner. SEEK MEDICAL CARE IF:   Your symptoms are not improving after 3 days of treatment.  You have increased discharge or pain.  You have a fever. MAKE SURE YOU:   Understand  these instructions.  Will watch your condition.  Will get help right away if you are not doing well or get worse. FOR MORE INFORMATION  Centers for Disease Control and Prevention, Division of STD Prevention: SolutionApps.co.zawww.cdc.gov/std American Sexual Health Association (ASHA): www.ashastd.org  Document Released: 06/04/2005 Document Revised: 03/25/2013 Document Reviewed: 01/14/2013 [facility] Patient Information 2015 [place], [place]. This information is not intended to replace advice given to you by your health care provider. Make sure you discuss any questions you have with your health care provider.

## 2013-12-16 NOTE — MAU Provider Note (Signed)
Attestation of Attending Supervision of Advanced Practitioner (PA/CNM/NP): Evaluation and management procedures were performed by the Advanced Practitioner under my supervision and collaboration.  I have reviewed the Advanced Practitioner's note and chart, and I agree with the management and plan.  Reva BoresPRATT,TANYA S, MD [facility] Attending 12/16/2013 3:54 PM

## 2013-12-16 NOTE — MAU Note (Signed)
Patient states she terminated a pregnancy on 5-15. States she has been having right back/flank pain with a white vaginal discharge for about one week. Some nausea, no vomiting.

## 2013-12-16 NOTE — MAU Provider Note (Signed)
History     CSN: [id number]  Arrival date and time: 12/16/13 1131   First Provider Initiated Contact with Patient 12/16/13 1307      Chief Complaint  Patient presents with  . Back Pain  . Possible Pregnancy  . Vaginal Discharge   HPI This is a 23 y.o. female who presents with c/o right flank pain/back pain and white discharge. Had an aboriton in May and never used birth control after that. Desires STD testing again.  RN Note:  Patient states she terminated a pregnancy on 5-15. States she has been having right back/flank pain with a white vaginal discharge for about one week. Some nausea, no vomiting.        OB History   Grav Para Term Preterm Abortions TAB SAB Ect Mult Living   3 1 1  0 2 2 0 0 0 1      Past Medical History  Diagnosis Date  . Migraine   . 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 GERD (gastroesophageal reflux disease)     Past Surgical History  Procedure Laterality Date  . No past surgeries      Family History  Problem Relation Age of Onset  . Anesthesia problems Neg Hx   . Hypertension Mother   . Asthma Brother     History  Substance Use Topics  . Smoking status: Current Every Day Smoker -- 0.25 packs/day    Types: Cigarettes, Cigars  . Smokeless tobacco: Never Used  . Alcohol Use: No    Allergies: No Known Allergies  Prescriptions prior to admission  Medication Sig Dispense Refill  . azithromycin (ZITHROMAX) 250 MG tablet Take 4 tablets (1,000 mg total) by mouth once.  4 tablet  0  . metroNIDAZOLE (FLAGYL) 500 MG tablet Take 1 tablet (500 mg total) by mouth 2 (two) times daily. One po bid x 7 days  14 tablet  0    Review of Systems  Constitutional: Negative for fever, chills and malaise/fatigue.  Gastrointestinal: Negative for nausea, vomiting, abdominal pain, diarrhea and constipation.  Genitourinary:       Vaginal discharge   Musculoskeletal: Positive for back pain.   Physical Exam   Blood pressure 109/68, pulse 94,  temperature 98.8 F (37.1 C), resp. rate 16, height 5\' 1"  (1.549 m), weight 74.208 kg (163 lb 9.6 oz), last menstrual period 11/18/2013, SpO2 100.00%, unknown if currently breastfeeding.  Physical Exam  Constitutional: She is oriented to person, place, and time. She appears well-developed and well-nourished. No distress.  HENT:  Head: Normocephalic.  Cardiovascular: Normal rate.   Respiratory: Effort normal.  GI: Soft. She exhibits no distension. There is no tenderness. There is no rebound and no guarding.  Genitourinary: Uterus normal. Vaginal discharge (small amt white discharge) found.  Musculoskeletal: Normal range of motion.  Neurological: She is alert and oriented to person, place, and time.  Skin: Skin is warm and dry.  Psychiatric: She has a normal mood and affect.    MAU Course  Procedures  MDM Results for orders placed during the hospital encounter of 12/16/13 (from the past 24 hour(s))  URINALYSIS, ROUTINE W REFLEX MICROSCOPIC     Status: Abnormal   Collection Time    12/16/13 12:00 PM      Result Value Ref Range   Color, Urine YELLOW  YELLOW   APPearance HAZY (*) CLEAR   Specific Gravity, Urine 1.025  1.005 - 1.030   pH 5.5  5.0 - 8.0  Glucose, UA NEGATIVE  NEGATIVE mg/dL   Hgb urine dipstick TRACE (*) NEGATIVE   Bilirubin Urine NEGATIVE  NEGATIVE   Ketones, ur NEGATIVE  NEGATIVE mg/dL   Protein, ur NEGATIVE  NEGATIVE mg/dL   Urobilinogen, UA 0.2  0.0 - 1.0 mg/dL   Nitrite NEGATIVE  NEGATIVE   Leukocytes, UA NEGATIVE  NEGATIVE  URINE MICROSCOPIC-ADD ON     Status: Abnormal   Collection Time    12/16/13 12:00 PM      Result Value Ref Range   Squamous Epithelial / LPF FEW (*) RARE   WBC, UA 0-2  <3 WBC/hpf   Bacteria, UA FEW (*) RARE   Urine-Other MUCOUS PRESENT    POCT PREGNANCY, URINE     Status: None   Collection Time    12/16/13 12:04 PM      Result Value Ref Range   Preg Test, Ur NEGATIVE  NEGATIVE  WET PREP, GENITAL     Status: Abnormal    Collection Time    12/16/13  1:20 PM      Result Value Ref Range   Yeast Wet Prep HPF POC NONE SEEN  NONE SEEN   Trich, Wet Prep NONE SEEN  NONE SEEN   Clue Cells Wet Prep HPF POC FEW (*) NONE SEEN   WBC, Wet Prep HPF POC FEW (*) NONE SEEN     Assessment and Plan  A:  Mild BV       Back pain probably musculoskeletal       Not pregnant, risky sexual behavior  P:  Discussed findings       Rx Flagyl        Tylenol/ibuprofen for back pain        Safe sex/abstinence        Follow with [facility] for contraception (has Rx for OCPs, but did not fill due to Medicaid changing) and full STD testing  [facility] CenterWILLIAMS,Kaipo Ardis 12/16/2013, 1:41 PM

## 2013-12-17 LAB — GC/CHLAMYDIA PROBE AMP
CT PROBE, AMP APTIMA: NEGATIVE
GC PROBE AMP APTIMA: NEGATIVE

## 2014-02-08 ENCOUNTER — Inpatient Hospital Stay (HOSPITAL_COMMUNITY)
Admission: AD | Admit: 2014-02-08 | Discharge: 2014-02-08 | Disposition: A | Discharge status: Home / Self Care | Payer: Medicaid Other | Source: Ambulatory Visit | Attending: Obstetrics and Gynecology | Admitting: Obstetrics and Gynecology

## 2014-02-08 ENCOUNTER — Encounter (HOSPITAL_COMMUNITY): Payer: Self-pay | Admitting: *Deleted

## 2014-02-08 DIAGNOSIS — K219 Gastro-esophageal reflux disease without esophagitis: Secondary | ICD-10-CM | POA: Insufficient documentation

## 2014-02-08 DIAGNOSIS — N921 Excessive and frequent menstruation with irregular cycle: Secondary | ICD-10-CM | POA: Diagnosis not present

## 2014-02-08 DIAGNOSIS — R109 Unspecified abdominal pain: Secondary | ICD-10-CM | POA: Diagnosis present

## 2014-02-08 DIAGNOSIS — F17200 Nicotine dependence, unspecified, uncomplicated: Secondary | ICD-10-CM | POA: Diagnosis not present

## 2014-02-08 DIAGNOSIS — N926 Irregular menstruation, unspecified: Secondary | ICD-10-CM | POA: Insufficient documentation

## 2014-02-08 DIAGNOSIS — N939 Abnormal uterine and vaginal bleeding, unspecified: Secondary | ICD-10-CM

## 2014-02-08 DIAGNOSIS — N898 Other specified noninflammatory disorders of vagina: Secondary | ICD-10-CM

## 2014-02-08 LAB — URINALYSIS, ROUTINE W REFLEX MICROSCOPIC
BILIRUBIN URINE: NEGATIVE
Glucose, UA: NEGATIVE mg/dL
KETONES UR: NEGATIVE mg/dL
Leukocytes, UA: NEGATIVE
Nitrite: NEGATIVE
Protein, ur: NEGATIVE mg/dL
Specific Gravity, Urine: <1.005 — ABNORMAL LOW (ref 1.005–1.030)
Urobilinogen, UA: 0.2 mg/dL (ref 0.0–1.0)
pH: 6 (ref 5.0–8.0)

## 2014-02-08 LAB — WET PREP, GENITAL
Clue Cells Wet Prep HPF POC: NONE SEEN
TRICH WET PREP: NONE SEEN
YEAST WET PREP: NONE SEEN

## 2014-02-08 LAB — POCT PREGNANCY, URINE: Preg Test, Ur: NEGATIVE

## 2014-02-08 LAB — URINE MICROSCOPIC-ADD ON

## 2014-02-08 MED ORDER — IBUPROFEN 600 MG PO TABS
600.0000 mg | ORAL_TABLET | Freq: Once | ORAL | Status: AC
  Administered 2014-02-08: 600 mg via ORAL
  Filled 2014-02-08: qty 1

## 2014-02-08 MED ORDER — NORGESTIMATE-ETH ESTRADIOL 0.25-35 MG-MCG PO TABS: 1.0000 | ORAL_TABLET | Freq: Every day | ORAL | Status: DC

## 2014-02-08 MED ORDER — IBUPROFEN 600 MG PO TABS: 600.0000 mg | ORAL_TABLET | Freq: Four times a day (QID) | ORAL | Status: DC | PRN

## 2014-02-08 NOTE — MAU Provider Note (Signed)
Attestation of Attending Supervision of Advanced Practitioner (CNM/NP): Evaluation and management procedures were performed by the Advanced Practitioner under my supervision and collaboration.  I have reviewed the Advanced Practitioner's note and chart, and I agree with the management and plan.  Kieryn Burtis 02/08/2014 11:32 AM

## 2014-02-08 NOTE — MAU Note (Signed)
Pt presents to MAU with complaints of sharp pain in her left side, vaginal spotting, urine frequency for 2 days.

## 2014-02-08 NOTE — Discharge Instructions (Signed)
Abnormal Uterine Bleeding Abnormal uterine bleeding can affect women at various stages in life, including teenagers, women in their reproductive years, pregnant women, and women who have reached menopause. Several kinds of uterine bleeding are considered abnormal, including:  Bleeding or spotting between periods.   Bleeding after sexual intercourse.   Bleeding that is heavier or more than normal.   Periods that last longer than usual.  Bleeding after menopause.  Many cases of abnormal uterine bleeding are minor and simple to treat, while others are more serious. Any type of abnormal bleeding should be evaluated by your health care provider. Treatment will depend on the cause of the bleeding. HOME CARE INSTRUCTIONS Monitor your condition for any changes. The following actions may help to alleviate any discomfort you are experiencing:  Avoid the use of tampons and douches as directed by your health care provider.  Change your pads frequently. You should get regular pelvic exams and Pap tests. Keep all follow-up appointments for diagnostic tests as directed by your health care provider.  SEEK MEDICAL CARE IF:   Your bleeding lasts more than 1 week.   You feel dizzy at times.  SEEK IMMEDIATE MEDICAL CARE IF:   You pass out.   You are changing pads every 15 to 30 minutes.   You have abdominal pain.  You have a fever.   You become sweaty or weak.   You are passing large blood clots from the vagina.   You start to feel nauseous and vomit. MAKE SURE YOU:   Understand these instructions.  Will watch your condition.  Will get help right away if you are not doing well or get worse. Document Released: 06/04/2005 Document Revised: 06/09/2013 Document Reviewed: 01/01/2013 Exit[employer] Patient Information 2015 Exit[employer], LLC. This information is not intended to replace advice given to you by your health care provider. Make sure you discuss any questions you have with your  health care provider.  

## 2014-02-08 NOTE — MAU Provider Note (Signed)
History     CSN: [id number]  Arrival date and time: 02/08/14 0901   First Provider Initiated Contact with Patient 02/08/14 0935      Chief Complaint  Patient presents with  . Urinary Frequency  . Vaginal Bleeding  . pai left side    HPI  [name] is a 23 y.o. female [phone] who present to MAU with vaginal spotting, left sided abdominal pain and urine frequency. The patient was on birth control until June when she stopped; since then she has not had a normal cycle. The abdominal pain started 2-3 days ago; she tried tylenol which did not help. The pain is sharp and shooting and comes and goes. The urinary frequency started 1 week ago.    OB History   Grav Para Term Preterm Abortions TAB SAB Ect Mult Living   0 2 2 0 0 0 1      Past Medical History  Diagnosis Date  . Migraine   . 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 GERD (gastroesophageal reflux disease)     Past Surgical History  Procedure Laterality Date  . No past surgeries      Family History  Problem Relation Age of Onset  . Anesthesia problems Neg Hx   . Hypertension Mother   . Asthma Brother     History  Substance Use Topics  . Smoking status: Current Every Day Smoker -- 0.25 packs/day    Types: Cigarettes, Cigars  . Smokeless tobacco: Never Used  . Alcohol Use: No    Allergies: No Known Allergies  Prescriptions prior to admission  Medication Sig Dispense Refill  . azithromycin (ZITHROMAX) 250 MG tablet Take 4 tablets (1,000 mg total) by mouth once.  4 tablet  0   Results for orders placed during the hospital encounter of 02/08/14 (from the past 48 hour(s))  URINALYSIS, ROUTINE W REFLEX MICROSCOPIC     Status: Abnormal   Collection Time    02/08/14  9:00 AM      Result Value Ref Range   Color, Urine YELLOW  YELLOW   APPearance CLEAR  CLEAR   Specific Gravity, Urine <1.005 (*) 1.005 - 1.030   pH 6.0  5.0 - 8.0   Glucose, UA NEGATIVE  NEGATIVE mg/dL   Hgb urine dipstick  TRACE (*) NEGATIVE   Bilirubin Urine NEGATIVE  NEGATIVE   Ketones, ur NEGATIVE  NEGATIVE mg/dL   Protein, ur NEGATIVE  NEGATIVE mg/dL   Urobilinogen, UA 0.2  0.0 - 1.0 mg/dL   Nitrite NEGATIVE  NEGATIVE   Leukocytes, UA NEGATIVE  NEGATIVE  URINE MICROSCOPIC-ADD ON     Status: Abnormal   Collection Time    02/08/14  9:00 AM      Result Value Ref Range   Squamous Epithelial / LPF FEW (*) RARE   WBC, UA 0-2  <3 WBC/hpf   Bacteria, UA RARE  RARE  POCT PREGNANCY, URINE     Status: None   Collection Time    02/08/14  9:20 AM      Result Value Ref Range   Preg Test, Ur NEGATIVE  NEGATIVE   Comment:            THE SENSITIVITY OF THIS     METHODOLOGY IS >24 mIU/mL  WET PREP, GENITAL     Status: Abnormal   Collection Time    02/08/14  9:50 AM      Result Value Ref  Range   Yeast Wet Prep HPF POC NONE SEEN  NONE SEEN   Trich, Wet Prep NONE SEEN  NONE SEEN   Clue Cells Wet Prep HPF POC NONE SEEN  NONE SEEN   WBC, Wet Prep HPF POC FEW (*) NONE SEEN   Comment: FEW BACTERIA SEEN    Review of Systems  Constitutional: Negative for fever and chills.  Gastrointestinal: Positive for nausea, abdominal pain and diarrhea.  Genitourinary: Positive for urgency and frequency. Negative for dysuria.       No vaginal discharge. + vaginal bleeding. No dysuria.    Physical Exam   Blood pressure 118/86, pulse 86, temperature 98.2 F (36.8 C), resp. rate 18, height  (1.549 m), weight 73.029 kg (161 lb), last menstrual period 10/30/2013, unknown if currently breastfeeding.  Physical Exam  Constitutional: She is oriented to person, place, and time. She appears well-developed and well-nourished. No distress.  HENT:  Head: Normocephalic.  Eyes: Pupils are equal, round, and reactive to light.  Neck: Neck supple.  Respiratory: Effort normal.  GI: Soft. She exhibits no distension. There is no tenderness. There is no rebound and no guarding.  Genitourinary: No vaginal discharge found.  Speculum  exam: Vagina - Scant amount of creamy discharge, no odor Cervix - scant contact bleeding Bimanual exam: Cervix closed, no CMT  Uterus non tender, normal size Adnexa non tender, no masses bilaterally GC/Chlam, wet prep done Chaperone present for exam.   Musculoskeletal: Normal range of motion.  Neurological: She is alert and oriented to person, place, and time.  Skin: Skin is warm. She is not diaphoretic.  Psychiatric: Her behavior is normal.    MAU Course  Procedures None  MDM Ibuprofen 600 mg PO  Wet prep GC Urine culture- pending  Patient desires BC pills; discussed sides affects including increased risk of blood clots (pt is a smoker). Pt aware and agrees to seek medical attention if any of signs and symptoms discussed occur.  Spotting and irregular cycle is likely related to the patient stopping BC pills in June.  Her pain subsided following ibuprofen   Assessment and Plan   A:  1. Vaginal spotting   2. Irregular bleeding   3.  Abdominal pain in female    P:  Discharge home in stable condition RX: Sprintec         Ibuprofen Return to MAU as needed, if symptoms worsen Urine culture pending   Stacey [name] 02/08/2014, 9:35 AM

## 2014-02-09 ENCOUNTER — Encounter (HOSPITAL_COMMUNITY): Payer: Self-pay | Admitting: Emergency Medicine

## 2014-02-09 ENCOUNTER — Emergency Department (HOSPITAL_COMMUNITY)
Admission: EM | Admit: 2014-02-09 | Discharge: 2014-02-10 | Disposition: A | Discharge status: Home / Self Care | Payer: Medicaid Other | Attending: Emergency Medicine | Admitting: Emergency Medicine

## 2014-02-09 DIAGNOSIS — S335XXA Sprain of ligaments of lumbar spine, initial encounter: Secondary | ICD-10-CM | POA: Insufficient documentation

## 2014-02-09 DIAGNOSIS — Z79899 Other long term (current) drug therapy: Secondary | ICD-10-CM | POA: Diagnosis not present

## 2014-02-09 DIAGNOSIS — Y92410 Unspecified street and highway as the place of occurrence of the external cause: Secondary | ICD-10-CM | POA: Insufficient documentation

## 2014-02-09 DIAGNOSIS — Y9389 Activity, other specified: Secondary | ICD-10-CM | POA: Diagnosis not present

## 2014-02-09 DIAGNOSIS — Z8719 Personal history of other diseases of the digestive system: Secondary | ICD-10-CM | POA: Insufficient documentation

## 2014-02-09 DIAGNOSIS — Z8619 Personal history of other infectious and parasitic diseases: Secondary | ICD-10-CM | POA: Insufficient documentation

## 2014-02-09 DIAGNOSIS — T148XXA Other injury of unspecified body region, initial encounter: Secondary | ICD-10-CM

## 2014-02-09 DIAGNOSIS — Z8669 Personal history of other diseases of the nervous system and sense organs: Secondary | ICD-10-CM | POA: Insufficient documentation

## 2014-02-09 DIAGNOSIS — F17200 Nicotine dependence, unspecified, uncomplicated: Secondary | ICD-10-CM | POA: Diagnosis not present

## 2014-02-09 DIAGNOSIS — IMO0002 Reserved for concepts with insufficient information to code with codable children: Secondary | ICD-10-CM | POA: Insufficient documentation

## 2014-02-09 LAB — URINE CULTURE: Colony Count: 8000

## 2014-02-09 LAB — GC/CHLAMYDIA PROBE AMP
CT PROBE, AMP APTIMA: NEGATIVE
GC PROBE AMP APTIMA: NEGATIVE

## 2014-02-09 MED ORDER — CYCLOBENZAPRINE HCL 10 MG PO TABS: 10.0000 mg | ORAL_TABLET | Freq: Three times a day (TID) | ORAL | Status: DC | PRN

## 2014-02-09 MED ORDER — NAPROXEN 500 MG PO TABS: 500.0000 mg | ORAL_TABLET | Freq: Two times a day (BID) | ORAL | Status: DC
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
## 2014-02-09 NOTE — ED Notes (Signed)
Discharge and follow up instructions reviewed with pt. Pt verbalized understanding.  
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
## 2014-02-10 NOTE — ED Provider Notes (Signed)
Medical screening examination/treatment/procedure(s) were performed by non-physician practitioner and as supervising physician I was immediately available for consultation/collaboration.   EKG Interpretation None        Woodie Trusty F Durante Violett, MD 02/10/14 0713 

## 2014-03-08 ENCOUNTER — Encounter (HOSPITAL_COMMUNITY): Payer: Self-pay | Admitting: Emergency Medicine

## 2014-03-08 ENCOUNTER — Emergency Department (HOSPITAL_COMMUNITY)
Admission: EM | Admit: 2014-03-08 | Discharge: 2014-03-08 | Discharge status: Against Medical Advice | Payer: Medicaid Other | Attending: Emergency Medicine | Admitting: Emergency Medicine

## 2014-03-08 ENCOUNTER — Emergency Department (HOSPITAL_COMMUNITY): Payer: Medicaid Other

## 2014-03-08 DIAGNOSIS — M25519 Pain in unspecified shoulder: Secondary | ICD-10-CM | POA: Insufficient documentation

## 2014-03-08 DIAGNOSIS — M545 Low back pain, unspecified: Secondary | ICD-10-CM | POA: Diagnosis not present

## 2014-03-08 DIAGNOSIS — R109 Unspecified abdominal pain: Secondary | ICD-10-CM | POA: Insufficient documentation

## 2014-03-08 DIAGNOSIS — N898 Other specified noninflammatory disorders of vagina: Secondary | ICD-10-CM | POA: Insufficient documentation

## 2014-03-08 DIAGNOSIS — F17200 Nicotine dependence, unspecified, uncomplicated: Secondary | ICD-10-CM | POA: Insufficient documentation

## 2014-03-08 DIAGNOSIS — Z3202 Encounter for pregnancy test, result negative: Secondary | ICD-10-CM | POA: Insufficient documentation

## 2014-03-08 LAB — URINALYSIS, ROUTINE W REFLEX MICROSCOPIC
Bilirubin Urine: NEGATIVE
Glucose, UA: NEGATIVE mg/dL
Hgb urine dipstick: NEGATIVE
KETONES UR: NEGATIVE mg/dL
Nitrite: NEGATIVE
PROTEIN: NEGATIVE mg/dL
Specific Gravity, Urine: 1.018 (ref 1.005–1.030)
Urobilinogen, UA: 0.2 mg/dL (ref 0.0–1.0)
pH: 5.5 (ref 5.0–8.0)

## 2014-03-08 LAB — URINE MICROSCOPIC-ADD ON

## 2014-03-08 LAB — PREGNANCY, URINE: Preg Test, Ur: NEGATIVE

## 2014-03-08 NOTE — ED Notes (Addendum)
Pt approached nurse first desk stating that she was leaving. RN attempted to convince patient to stay and be seen by EDP. Patient politely refused. RN encouraged patient to come back if her symptoms continue or get worse. Patient verbalized understanding.

## 2014-03-08 NOTE — ED Notes (Signed)
Pt presents with lower abdominal pain that started a week ago- admits to vaginal discharge, denies bleeding.  Pt denies urinary symptoms.  Pt also reports lower back pain, denies injury- ambulatory in triage without difficulty.  Pt c/o right shoulder pain for the past week as well, denies injury- pt reports difficulty with movement, no obvious deformity noted.

## 2014-04-17 ENCOUNTER — Inpatient Hospital Stay (HOSPITAL_COMMUNITY)
Admission: AD | Admit: 2014-04-17 | Discharge: 2014-04-17 | Disposition: A | Discharge status: Home / Self Care | Payer: Medicaid Other | Source: Ambulatory Visit | Attending: Obstetrics and Gynecology | Admitting: Obstetrics and Gynecology

## 2014-04-17 ENCOUNTER — Encounter (HOSPITAL_COMMUNITY): Payer: Self-pay

## 2014-04-17 DIAGNOSIS — Z720 Tobacco use: Secondary | ICD-10-CM | POA: Diagnosis not present

## 2014-04-17 DIAGNOSIS — N946 Dysmenorrhea, unspecified: Secondary | ICD-10-CM

## 2014-04-17 LAB — RPR

## 2014-04-17 LAB — WET PREP, GENITAL
CLUE CELLS WET PREP: NONE SEEN
Trich, Wet Prep: NONE SEEN
Yeast Wet Prep HPF POC: NONE SEEN

## 2014-04-17 LAB — URINALYSIS, ROUTINE W REFLEX MICROSCOPIC
Bilirubin Urine: NEGATIVE
Glucose, UA: NEGATIVE mg/dL
Ketones, ur: NEGATIVE mg/dL
LEUKOCYTES UA: NEGATIVE
Nitrite: NEGATIVE
Protein, ur: NEGATIVE mg/dL
Specific Gravity, Urine: 1.015 (ref 1.005–1.030)
UROBILINOGEN UA: 0.2 mg/dL (ref 0.0–1.0)
pH: 6.5 (ref 5.0–8.0)

## 2014-04-17 LAB — URINE MICROSCOPIC-ADD ON

## 2014-04-17 LAB — POCT PREGNANCY, URINE: PREG TEST UR: NEGATIVE

## 2014-04-17 LAB — HIV ANTIBODY (ROUTINE TESTING W REFLEX): HIV 1&2 Ab, 4th Generation: NONREACTIVE

## 2014-04-17 MED ORDER — SUMATRIPTAN SUCCINATE 25 MG PO TABS
25.0000 mg | ORAL_TABLET | ORAL | Status: DC | PRN
  Administered 2014-04-17: 25 mg via ORAL
  Filled 2014-04-17: qty 1

## 2014-04-17 MED ORDER — KETOROLAC TROMETHAMINE 60 MG/2ML IM SOLN
60.0000 mg | Freq: Once | INTRAMUSCULAR | Status: AC
  Administered 2014-04-17: 60 mg via INTRAMUSCULAR
  Filled 2014-04-17: qty 2

## 2014-04-17 MED ORDER — IBUPROFEN 600 MG PO TABS: 600.0000 mg | ORAL_TABLET | Freq: Four times a day (QID) | ORAL | Status: DC | PRN

## 2014-04-17 NOTE — Discharge Instructions (Signed)
Continue your birth control pills. Follow up with your doctor. Get your Ibuprofen prescribed and take again at 10 pm tonight.  Continue as ordered for 48 hours.  Take with food. Drink at least 8 8-oz glasses of water every day.

## 2014-04-17 NOTE — MAU Note (Signed)
Pt presents complaining of a headache, cramping and vaginal bleeding with clots. States she is on her period and has a history of painful periods. Currently on the pill for birth control. States she has run out of migraine medication. Denies other vaginal discharge.

## 2014-04-17 NOTE — MAU Provider Note (Signed)
History     CSN: [id number]  Arrival date and time: 04/17/14 1344   First Provider Initiated Contact with Patient 04/17/14 1500      Chief Complaint  Patient presents with  . Headache  . Abdominal Pain  . Vaginal Bleeding   HPI Stacey [name] 23 y.o. comes to MAU with heavy vaginal bleeding and severe lower abdominal cramping x 2 days.  Is on Birth control pills - orthotricyclen and had a 12 day cycle beginning Oct 4 and then menses started again yesterday.  Took ibuprofen 600 mg PO at 1 am and has not had pain medication since then.  States she has been taking her BCP daily.  Also has a history of migraines and has medication from her PMD, but has used all 10 pills this month.  Has a migraine today and has not had any medication for it.   OB History   Grav Para Term Preterm Abortions TAB SAB Ect Mult Living   3 1 1  0 2 2 0 0 0 1      Past Medical History  Diagnosis Date  . Migraine   . 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 GERD (gastroesophageal reflux disease)     Past Surgical History  Procedure Laterality Date  . No past surgeries      Family History  Problem Relation Age of Onset  . Anesthesia problems Neg Hx   . Hypertension Mother   . Asthma Brother     History  Substance Use Topics  . Smoking status: Current Every Day Smoker -- 0.25 packs/day    Types: Cigarettes, Cigars  . Smokeless tobacco: Never Used  . Alcohol Use: No    Allergies: No Known Allergies  Prescriptions prior to admission  Medication Sig Dispense Refill  . Aspirin-Acetaminophen-Caffeine 500-325-65 MG PACK Take 1 Package by mouth daily as needed (headache).      Marland Kitchen. ibuprofen (ADVIL,MOTRIN) 600 MG tablet Take 1 tablet (600 mg total) by mouth every 6 (six) hours as needed.  30 tablet  0  . norgestimate-ethinyl estradiol (ORTHO-CYCLEN,SPRINTEC,PREVIFEM) 0.25-35 MG-MCG tablet Take 1 tablet by mouth daily.  1 Package  11  . rizatriptan (MAXALT) 10 MG tablet Take 10 mg by mouth as  needed for migraine. May repeat in 2 hours if needed        Review of Systems  Constitutional: Negative for fever.  Gastrointestinal: Positive for abdominal pain. Negative for nausea, vomiting and constipation.  Genitourinary:       No vaginal discharge. Vaginal bleeding with clots No dysuria  Neurological: Positive for headaches.   Physical Exam   Blood pressure 117/63, pulse 97, temperature 98.6 F (37 C), temperature source Oral, resp. rate 18, last menstrual period 03/21/2014.  Physical Exam  Nursing note and vitals reviewed. Constitutional: She is oriented to person, place, and time. She appears well-developed and well-nourished.  HENT:  Head: Normocephalic.  Eyes: EOM are normal.  Neck: Neck supple.  GI: Soft. There is tenderness. There is no rebound and no guarding.  Genitourinary:  Speculum exam: Vagina - Small amount of bloody discharge, no odor Cervix - Minimal active4 bleeding Bimanual exam: Cervix closed Uterus tender, to the left, stool palpated in the rectum Adnexa non tender, no masses bilaterally GC/Chlam, wet prep done Chaperone present for exam.  Musculoskeletal: Normal range of motion.  Neurological: She is alert and oriented to person, place, and time.  Skin: Skin is warm and dry.  Psychiatric: She  has a normal mood and affect.    MAU Course  Procedures Results for orders placed during the hospital encounter of 04/17/14 (from the past 24 hour(s))  URINALYSIS, ROUTINE W REFLEX MICROSCOPIC     Status: Abnormal   Collection Time    04/17/14  2:00 PM      Result Value Ref Range   Color, Urine RED (*) YELLOW   APPearance CLOUDY (*) CLEAR   Specific Gravity, Urine 1.015  1.005 - 1.030   pH 6.5  5.0 - 8.0   Glucose, UA NEGATIVE  NEGATIVE mg/dL   Hgb urine dipstick LARGE (*) NEGATIVE   Bilirubin Urine NEGATIVE  NEGATIVE   Ketones, ur NEGATIVE  NEGATIVE mg/dL   Protein, ur NEGATIVE  NEGATIVE mg/dL   Urobilinogen, UA 0.2  0.0 - 1.0 mg/dL   Nitrite  NEGATIVE  NEGATIVE   Leukocytes, UA NEGATIVE  NEGATIVE  URINE MICROSCOPIC-ADD ON     Status: None   Collection Time    04/17/14  2:00 PM      Result Value Ref Range   Squamous Epithelial / LPF RARE  RARE   RBC / HPF TOO NUMEROUS TO COUNT  <3 RBC/hpf   Urine-Other MUCOUS PRESENT    POCT PREGNANCY, URINE     Status: None   Collection Time    04/17/14  2:49 PM      Result Value Ref Range   Preg Test, Ur NEGATIVE  NEGATIVE  WET PREP, GENITAL     Status: Abnormal   Collection Time    04/17/14  3:00 PM      Result Value Ref Range   Yeast Wet Prep HPF POC NONE SEEN  NONE SEEN   Trich, Wet Prep NONE SEEN  NONE SEEN   Clue Cells Wet Prep HPF POC NONE SEEN  NONE SEEN   WBC, Wet Prep HPF POC RARE (*) NONE SEEN    MDM Will give Toradol 60 mg IM  And imitrex 25 mg PO while in MAU 1620  Headache is much better.  Still having some cramping in lower abdomen but it is improved.  Assessment and Plan  Dymemorrhea  Plan Follow up with your doctor. Continue your birth control pills GC/Chlam pending rx ibuprofen 600mg  po q 6 h for pain.   BURLESON,TERRI 04/17/2014, 3:00 PM

## 2014-04-19 ENCOUNTER — Encounter (HOSPITAL_COMMUNITY): Payer: Self-pay

## 2014-04-20 LAB — GC/CHLAMYDIA PROBE AMP
CT PROBE, AMP APTIMA: NEGATIVE
GC Probe RNA: NEGATIVE

## 2014-05-01 ENCOUNTER — Emergency Department (HOSPITAL_COMMUNITY): Payer: Medicaid Other

## 2014-05-01 ENCOUNTER — Encounter (HOSPITAL_COMMUNITY): Payer: Self-pay | Admitting: Emergency Medicine

## 2014-05-01 ENCOUNTER — Emergency Department (HOSPITAL_COMMUNITY)
Admission: EM | Admit: 2014-05-01 | Discharge: 2014-05-01 | Disposition: A | Discharge status: Home / Self Care | Payer: Medicaid Other | Attending: Emergency Medicine | Admitting: Emergency Medicine

## 2014-05-01 DIAGNOSIS — G43909 Migraine, unspecified, not intractable, without status migrainosus: Secondary | ICD-10-CM | POA: Diagnosis not present

## 2014-05-01 DIAGNOSIS — Y9389 Activity, other specified: Secondary | ICD-10-CM | POA: Diagnosis not present

## 2014-05-01 DIAGNOSIS — Y998 Other external cause status: Secondary | ICD-10-CM | POA: Diagnosis not present

## 2014-05-01 DIAGNOSIS — Z8619 Personal history of other infectious and parasitic diseases: Secondary | ICD-10-CM | POA: Diagnosis not present

## 2014-05-01 DIAGNOSIS — Z793 Long term (current) use of hormonal contraceptives: Secondary | ICD-10-CM | POA: Diagnosis not present

## 2014-05-01 DIAGNOSIS — S233XXA Sprain of ligaments of thoracic spine, initial encounter: Secondary | ICD-10-CM | POA: Diagnosis not present

## 2014-05-01 DIAGNOSIS — Y92410 Unspecified street and highway as the place of occurrence of the external cause: Secondary | ICD-10-CM | POA: Diagnosis not present

## 2014-05-01 DIAGNOSIS — S39012A Strain of muscle, fascia and tendon of lower back, initial encounter: Secondary | ICD-10-CM | POA: Diagnosis not present

## 2014-05-01 DIAGNOSIS — Z8719 Personal history of other diseases of the digestive system: Secondary | ICD-10-CM | POA: Diagnosis not present

## 2014-05-01 DIAGNOSIS — Z720 Tobacco use: Secondary | ICD-10-CM | POA: Insufficient documentation

## 2014-05-01 DIAGNOSIS — S239XXA Sprain of unspecified parts of thorax, initial encounter: Secondary | ICD-10-CM

## 2014-05-01 DIAGNOSIS — S3992XA Unspecified injury of lower back, initial encounter: Secondary | ICD-10-CM | POA: Diagnosis present

## 2014-05-01 MED ORDER — CYCLOBENZAPRINE HCL 10 MG PO TABS
10.0000 mg | ORAL_TABLET | Freq: Once | ORAL | Status: AC
  Administered 2014-05-01: 10 mg via ORAL
  Filled 2014-05-01: qty 1

## 2014-05-01 MED ORDER — CARISOPRODOL 350 MG PO TABS: 350.0000 mg | ORAL_TABLET | Freq: Three times a day (TID) | ORAL | Status: DC

## 2014-05-01 MED ORDER — HYDROCODONE-ACETAMINOPHEN 5-325 MG PO TABS: 1.0000 | ORAL_TABLET | ORAL | Status: DC | PRN

## 2014-05-01 MED ORDER — HYDROCODONE-ACETAMINOPHEN 5-325 MG PO TABS
1.0000 | ORAL_TABLET | Freq: Once | ORAL | Status: AC
  Administered 2014-05-01: 1 via ORAL
  Filled 2014-05-01: qty 1

## 2014-05-01 NOTE — ED Notes (Signed)
Pt. Refused wheelchair 
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
## 2014-07-04 ENCOUNTER — Encounter (HOSPITAL_COMMUNITY): Payer: Self-pay | Admitting: *Deleted

## 2014-07-04 ENCOUNTER — Inpatient Hospital Stay (HOSPITAL_COMMUNITY)
Admission: AD | Admit: 2014-07-04 | Discharge: 2014-07-04 | Disposition: A | Discharge status: Home / Self Care | Payer: Medicaid Other | Source: Ambulatory Visit | Attending: Obstetrics & Gynecology | Admitting: Obstetrics & Gynecology

## 2014-07-04 DIAGNOSIS — A499 Bacterial infection, unspecified: Secondary | ICD-10-CM

## 2014-07-04 DIAGNOSIS — F17200 Nicotine dependence, unspecified, uncomplicated: Secondary | ICD-10-CM

## 2014-07-04 DIAGNOSIS — B9689 Other specified bacterial agents as the cause of diseases classified elsewhere: Secondary | ICD-10-CM | POA: Diagnosis not present

## 2014-07-04 DIAGNOSIS — N760 Acute vaginitis: Secondary | ICD-10-CM | POA: Insufficient documentation

## 2014-07-04 DIAGNOSIS — F17210 Nicotine dependence, cigarettes, uncomplicated: Secondary | ICD-10-CM | POA: Insufficient documentation

## 2014-07-04 DIAGNOSIS — Z308 Encounter for other contraceptive management: Secondary | ICD-10-CM | POA: Insufficient documentation

## 2014-07-04 DIAGNOSIS — R109 Unspecified abdominal pain: Secondary | ICD-10-CM | POA: Diagnosis present

## 2014-07-04 DIAGNOSIS — Z87891 Personal history of nicotine dependence: Secondary | ICD-10-CM

## 2014-07-04 DIAGNOSIS — Z309 Encounter for contraceptive management, unspecified: Secondary | ICD-10-CM

## 2014-07-04 LAB — WET PREP, GENITAL
Trich, Wet Prep: NONE SEEN
Yeast Wet Prep HPF POC: NONE SEEN

## 2014-07-04 LAB — URINALYSIS, ROUTINE W REFLEX MICROSCOPIC
Bilirubin Urine: NEGATIVE
Glucose, UA: NEGATIVE mg/dL
Ketones, ur: NEGATIVE mg/dL
LEUKOCYTES UA: NEGATIVE
Nitrite: NEGATIVE
PH: 7 (ref 5.0–8.0)
Protein, ur: NEGATIVE mg/dL
SPECIFIC GRAVITY, URINE: 1.015 (ref 1.005–1.030)
Urobilinogen, UA: 0.2 mg/dL (ref 0.0–1.0)

## 2014-07-04 LAB — URINE MICROSCOPIC-ADD ON

## 2014-07-04 LAB — POCT PREGNANCY, URINE: Preg Test, Ur: NEGATIVE

## 2014-07-04 MED ORDER — METRONIDAZOLE 500 MG PO TABS: 500.0000 mg | ORAL_TABLET | Freq: Two times a day (BID) | ORAL | Status: DC

## 2014-07-04 MED ORDER — NORETHINDRONE-ETH ESTRADIOL 1-35 MG-MCG PO TABS: 1.0000 | ORAL_TABLET | Freq: Every day | ORAL | Status: DC

## 2014-07-04 MED ORDER — TRAMADOL HCL 50 MG PO TABS: 50.0000 mg | ORAL_TABLET | Freq: Once | ORAL | Status: DC

## 2014-07-04 NOTE — MAU Note (Signed)
E Signature not working, pt attempted to signed.  Dc'd to home in good condition with printed AVS.

## 2014-07-04 NOTE — Discharge Instructions (Signed)
Bacterial Vaginosis °Bacterial vaginosis is a vaginal infection that occurs when the normal balance of bacteria in the vagina is disrupted. It results from an overgrowth of certain bacteria. This is the most common vaginal infection in women of childbearing age. Treatment is important to prevent complications, especially in pregnant women, as it can cause a premature delivery. °CAUSES  °Bacterial vaginosis is caused by an increase in harmful bacteria that are normally present in smaller amounts in the vagina. Several different kinds of bacteria can cause bacterial vaginosis. However, the reason that the condition develops is not fully understood. °RISK FACTORS °Certain activities or behaviors can put you at an increased risk of developing bacterial vaginosis, including: °· Having a new sex partner or multiple sex partners. °· Douching. °· Using an intrauterine device (IUD) for contraception. °Women do not get bacterial vaginosis from toilet seats, bedding, swimming pools, or contact with objects around them. °SIGNS AND SYMPTOMS  °Some women with bacterial vaginosis have no signs or symptoms. Common symptoms include: °· Grey vaginal discharge. °· A fishlike odor with discharge, especially after sexual intercourse. °· Itching or burning of the vagina and vulva. °· Burning or pain with urination. °DIAGNOSIS  °Your health care provider will take a medical history and examine the vagina for signs of bacterial vaginosis. A sample of vaginal fluid may be taken. Your health care provider will look at this sample under a microscope to check for bacteria and abnormal cells. A vaginal pH test may also be done.  °TREATMENT  °Bacterial vaginosis may be treated with antibiotic medicines. These may be given in the form of a pill or a vaginal cream. A second round of antibiotics may be prescribed if the condition comes back after treatment.  °HOME CARE INSTRUCTIONS  °· Only take over-the-counter or prescription medicines as  directed by your health care provider. °· If antibiotic medicine was prescribed, take it as directed. Make sure you finish it even if you start to feel better. °· Do not have sex until treatment is completed. °· Tell all sexual partners that you have a vaginal infection. They should see their health care provider and be treated if they have problems, such as a mild rash or itching. °· Practice safe sex by using condoms and only having one sex partner. °SEEK MEDICAL CARE IF:  °· Your symptoms are not improving after 3 days of treatment. °· You have increased discharge or pain. °· You have a fever. °MAKE SURE YOU:  °· Understand these instructions. °· Will watch your condition. °· Will get help right away if you are not doing well or get worse. °FOR MORE INFORMATION  °Centers for Disease Control and Prevention, Division of STD Prevention: www.cdc.gov/std °American Sexual Health Association (ASHA): www.ashastd.org  °Document Released: 06/04/2005 Document Revised: 03/25/2013 Document Reviewed: 01/14/2013 °ExitCare® Patient Information ©2015 [employer] LLC. This information is not intended to replace advice given to you by your health care provider. Make sure you discuss any questions you have with your health care provider. °Contraception Choices °Contraception (birth control) is the use of any methods or devices to prevent pregnancy. Below are some methods to help avoid pregnancy. °HORMONAL METHODS  °· Contraceptive implant. This is a thin, plastic tube containing progesterone hormone. It does not contain estrogen hormone. Your health care provider inserts the tube in the inner part of the upper arm. The tube can remain in place for up to 3 years. After 3 years, the implant must be removed. The implant prevents the ovaries from   releasing an egg (ovulation), thickens the cervical mucus to prevent sperm from entering the uterus, and thins the lining of the inside of the uterus. °· Progesterone-only injections. These  injections are given every 3 months by your health care provider to prevent pregnancy. This synthetic progesterone hormone stops the ovaries from releasing eggs. It also thickens cervical mucus and changes the uterine lining. This makes it harder for sperm to survive in the uterus. °· Birth control pills. These pills contain estrogen and progesterone hormone. They work by preventing the ovaries from releasing eggs (ovulation). They also cause the cervical mucus to thicken, preventing the sperm from entering the uterus. Birth control pills are prescribed by a health care provider. Birth control pills can also be used to treat heavy periods. °· Minipill. This type of birth control pill contains only the progesterone hormone. They are taken every day of each month and must be prescribed by your health care provider. °· Birth control patch. The patch contains hormones similar to those in birth control pills. It must be changed once a week and is prescribed by a health care provider. °· Vaginal ring. The ring contains hormones similar to those in birth control pills. It is left in the vagina for 3 weeks, removed for 1 week, and then a new one is put back in place. The patient must be comfortable inserting and removing the ring from the vagina. A health care provider's prescription is necessary. °· Emergency contraception. Emergency contraceptives prevent pregnancy after unprotected sexual intercourse. This pill can be taken right after sex or up to 5 days after unprotected sex. It is most effective the sooner you take the pills after having sexual intercourse. Most emergency contraceptive pills are available without a prescription. Check with your pharmacist. Do not use emergency contraception as your only form of birth control. °BARRIER METHODS  °· Female condom. This is a thin sheath (latex or rubber) that is worn over the penis during sexual intercourse. It can be used with spermicide to increase  effectiveness. °· Female condom. This is a soft, loose-fitting sheath that is put into the vagina before sexual intercourse. °· Diaphragm. This is a soft, latex, dome-shaped barrier that must be fitted by a health care provider. It is inserted into the vagina, along with a spermicidal jelly. It is inserted before intercourse. The diaphragm should be left in the vagina for 6 to 8 hours after intercourse. °· Cervical cap. This is a round, soft, latex or plastic cup that fits over the cervix and must be fitted by a health care provider. The cap can be left in place for up to 48 hours after intercourse. °· Sponge. This is a soft, circular piece of polyurethane foam. The sponge has spermicide in it. It is inserted into the vagina after wetting it and before sexual intercourse. °· Spermicides. These are chemicals that kill or block sperm from entering the cervix and uterus. They come in the form of creams, jellies, suppositories, foam, or tablets. They do not require a prescription. They are inserted into the vagina with an applicator before having sexual intercourse. The process must be repeated every time you have sexual intercourse. °INTRAUTERINE CONTRACEPTION °· Intrauterine device (IUD). This is a T-shaped device that is put in a woman's uterus during a menstrual period to prevent pregnancy. There are 2 types: °¨ Copper IUD. This type of IUD is wrapped in copper wire and is placed inside the uterus. Copper makes the uterus and fallopian tubes produce a fluid that kills   sperm. It can stay in place for 10 years. °¨ Hormone IUD. This type of IUD contains the hormone progestin (synthetic progesterone). The hormone thickens the cervical mucus and prevents sperm from entering the uterus, and it also thins the uterine lining to prevent implantation of a fertilized egg. The hormone can weaken or kill the sperm that get into the uterus. It can stay in place for 3-5 years, depending on which type of IUD is used. °PERMANENT  METHODS OF CONTRACEPTION °· Female tubal ligation. This is when the woman's fallopian tubes are surgically sealed, tied, or blocked to prevent the egg from traveling to the uterus. °· Hysteroscopic sterilization. This involves placing a small coil or insert into each fallopian tube. Your doctor uses a technique called hysteroscopy to do the procedure. The device causes scar tissue to form. This results in permanent blockage of the fallopian tubes, so the sperm cannot fertilize the egg. It takes about 3 months after the procedure for the tubes to become blocked. You must use another form of birth control for these 3 months. °· Female sterilization. This is when the female has the tubes that carry sperm tied off (vasectomy). This blocks sperm from entering the vagina during sexual intercourse. After the procedure, the man can still ejaculate fluid (semen). °NATURAL PLANNING METHODS °· Natural family planning. This is not having sexual intercourse or using a barrier method (condom, diaphragm, cervical cap) on days the woman could become pregnant. °· Calendar method. This is keeping track of the length of each menstrual cycle and identifying when you are fertile. °· Ovulation method. This is avoiding sexual intercourse during ovulation. °· Symptothermal method. This is avoiding sexual intercourse during ovulation, using a thermometer and ovulation symptoms. °· Post-ovulation method. This is timing sexual intercourse after you have ovulated. °Regardless of which type or method of contraception you choose, it is important that you use condoms to protect against the transmission of sexually transmitted infections (STIs). Talk with your health care provider about which form of contraception is most appropriate for you. °Document Released: 06/04/2005 Document Revised: 06/09/2013 Document Reviewed: 11/27/2012 °ExitCare® Patient Information ©2015 [employer], LLC. This information is not intended to replace advice given to you by  your health care provider. Make sure you discuss any questions you have with your health care provider. ° °

## 2014-07-04 NOTE — MAU Provider Note (Signed)
History     CSN: [id number]  Arrival date and time: 07/04/14 [id number]   First Provider Initiated Contact with Patient 07/04/14 0125      Chief Complaint  Patient presents with  . Abdominal Pain  . Back Pain  . Vaginal Discharge  . Dysuria   HPI Comments: Stacey [name] 24 y.o. [id number] presents to MAU with painful urination and vaginal discharge for the last 3 days. New partner in last 2 months and no birth control. She used a douche yesterday.   Abdominal Pain Associated symptoms include dysuria.  Back Pain Associated symptoms include abdominal pain and dysuria.  Vaginal Discharge The patient's primary symptoms include vaginal discharge. Associated symptoms include abdominal pain, back pain and dysuria.  Dysuria       Past Medical History  Diagnosis Date  . Migraine   . 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 GERD (gastroesophageal reflux disease)     Past Surgical History  Procedure Laterality Date  . No past surgeries      Family History  Problem Relation Age of Onset  . Anesthesia problems Neg Hx   . Hypertension Mother   . Asthma Brother     History  Substance Use Topics  . Smoking status: Current Every Day Smoker -- 0.25 packs/day    Types: Cigarettes, Cigars  . Smokeless tobacco: Never Used  . Alcohol Use: No    Allergies: No Known Allergies  Prescriptions prior to admission  Medication Sig Dispense Refill Last Dose  . Aspirin-Acetaminophen-Caffeine 500-325-65 MG PACK Take 1 Package by mouth daily as needed (headache).   04/16/2014 at Unknown time  . carisoprodol (SOMA) 350 MG tablet Take 1 tablet (350 mg total) by mouth 3 (three) times daily. 20 tablet 0   . HYDROcodone-acetaminophen (NORCO/VICODIN) 5-325 MG per tablet Take 1-2 tablets by mouth every 4 (four) hours as needed for moderate pain or severe pain. 15 tablet 0   . ibuprofen (ADVIL,MOTRIN) 600 MG tablet Take 1 tablet (600 mg total) by mouth every 6 (six) hours as needed. 30 tablet 0   .  norgestimate-ethinyl estradiol (ORTHO-CYCLEN,SPRINTEC,PREVIFEM) 0.25-35 MG-MCG tablet Take 1 tablet by mouth daily. 1 Package 11 04/17/2014 at Unknown time  . rizatriptan (MAXALT) 10 MG tablet Take 10 mg by mouth as needed for migraine. May repeat in 2 hours if needed   04/16/2014 at Unknown time    Review of Systems  Constitutional: Negative.   HENT: Negative.   Cardiovascular: Negative.   Gastrointestinal: Positive for abdominal pain.  Genitourinary: Positive for dysuria and vaginal discharge.  Musculoskeletal: Positive for back pain.  Skin: Negative.   Neurological: Negative.   Psychiatric/Behavioral: Negative.    Physical Exam   Blood pressure 121/69, pulse 90, temperature 98.8 F (37.1 C), resp. rate 18, height 5' (1.524 m), weight 70.308 kg (155 lb), last menstrual period 05/28/2014.  Physical Exam  Constitutional: She is oriented to person, place, and time. She appears well-developed and well-nourished. No distress.  HENT:  Head: Normocephalic and atraumatic.  GI: Soft. She exhibits no distension. There is no tenderness. There is no rebound.  Genitourinary:  Genital:external negative Vaginal:scant amount white discharge Cervix:closed/ thick Bimanual: nontender   Musculoskeletal: Normal range of motion.  Neurological: She is alert and oriented to person, place, and time.  Skin: Skin is warm and dry.  Psychiatric: She has a normal mood and affect. Her behavior is normal. Judgment and thought content normal.    MAU Course  Procedures  MDM  Wet prep/ gc/chlamydia Ultram for discomfort  Assessment and Plan   A: Bacterial Vaginosis Contraception Management  P: Flagyl 500 mg po BID x 7 days Ortho -Novum 1/35 mg  Needs to return [facility] for follow up and management Return to MAU as needed  Carolynn Serve 07/04/2014, 1:35 AM

## 2014-07-04 NOTE — MAU Note (Addendum)
Having white vag discharge for 3 days and hurts in lower stomach when i pee.Lambert Mody. Sharp pains in lower back and abd for 3 days.

## 2014-07-05 LAB — GC/CHLAMYDIA PROBE AMP (~~LOC~~) NOT AT ARMC
CHLAMYDIA, DNA PROBE: NEGATIVE
NEISSERIA GONORRHEA: NEGATIVE

## 2014-09-16 ENCOUNTER — Other Ambulatory Visit (INDEPENDENT_AMBULATORY_CARE_PROVIDER_SITE_OTHER): Payer: Self-pay | Admitting: Nurse Practitioner

## 2014-09-22 ENCOUNTER — Encounter: Payer: Self-pay | Admitting: *Deleted

## 2014-09-30 ENCOUNTER — Inpatient Hospital Stay (HOSPITAL_COMMUNITY)
Admission: AD | Admit: 2014-09-30 | Discharge: 2014-09-30 | Disposition: A | Discharge status: Home / Self Care | Payer: Medicaid Other | Source: Ambulatory Visit | Attending: Obstetrics & Gynecology | Admitting: Obstetrics & Gynecology

## 2014-09-30 ENCOUNTER — Encounter (HOSPITAL_COMMUNITY): Payer: Self-pay

## 2014-09-30 DIAGNOSIS — F17210 Nicotine dependence, cigarettes, uncomplicated: Secondary | ICD-10-CM | POA: Diagnosis not present

## 2014-09-30 DIAGNOSIS — A6000 Herpesviral infection of urogenital system, unspecified: Secondary | ICD-10-CM | POA: Diagnosis not present

## 2014-09-30 DIAGNOSIS — B373 Candidiasis of vulva and vagina: Secondary | ICD-10-CM | POA: Diagnosis not present

## 2014-09-30 DIAGNOSIS — B009 Herpesviral infection, unspecified: Secondary | ICD-10-CM | POA: Diagnosis not present

## 2014-09-30 DIAGNOSIS — B3731 Acute candidiasis of vulva and vagina: Secondary | ICD-10-CM

## 2014-09-30 DIAGNOSIS — R109 Unspecified abdominal pain: Secondary | ICD-10-CM | POA: Diagnosis present

## 2014-09-30 DIAGNOSIS — K219 Gastro-esophageal reflux disease without esophagitis: Secondary | ICD-10-CM | POA: Insufficient documentation

## 2014-09-30 LAB — WET PREP, GENITAL
Clue Cells Wet Prep HPF POC: NONE SEEN
TRICH WET PREP: NONE SEEN

## 2014-09-30 LAB — URINALYSIS, ROUTINE W REFLEX MICROSCOPIC
Bilirubin Urine: NEGATIVE
GLUCOSE, UA: NEGATIVE mg/dL
Hgb urine dipstick: NEGATIVE
Ketones, ur: NEGATIVE mg/dL
Leukocytes, UA: NEGATIVE
Nitrite: NEGATIVE
PH: 5.5 (ref 5.0–8.0)
Protein, ur: NEGATIVE mg/dL
Specific Gravity, Urine: 1.02 (ref 1.005–1.030)
Urobilinogen, UA: 0.2 mg/dL (ref 0.0–1.0)

## 2014-09-30 LAB — POCT PREGNANCY, URINE: Preg Test, Ur: NEGATIVE

## 2014-09-30 MED ORDER — VALACYCLOVIR HCL 500 MG PO TABS: 500.0000 mg | ORAL_TABLET | Freq: Two times a day (BID) | ORAL | Status: DC

## 2014-09-30 MED ORDER — FLUCONAZOLE 150 MG PO TABS
150.0000 mg | ORAL_TABLET | Freq: Once | ORAL | Status: AC
  Administered 2014-09-30: 150 mg via ORAL
  Filled 2014-09-30: qty 1

## 2014-09-30 NOTE — MAU Note (Signed)
Pt reprots she has been having abd cramping on and off since last night and a thick white vag discharge.

## 2014-09-30 NOTE — MAU Provider Note (Signed)
History     CSN: [id number]  Arrival date and time: 09/30/14 1826   First Provider Initiated Contact with Patient 09/30/14 1946      Chief Complaint  Patient presents with  . Abdominal Pain  . Vaginal Discharge   HPI  [name] is a 24 y.o. [phone] who presents to MAU today with complaint of lower abdominal cramping and thick, white discharge x 2 days. She states associated itching and irritation as well. She has a history of HSV and is unsure if she is starting to have an outbreak. She denies vaginal bleeding, fever or UTI symptoms. She rates her abdominal pain at 7/10 now and last took [place] [employer] last night. She is sexually active and uses condoms sometimes.   OB History    Gravida Para Term Preterm AB TAB SAB Ectopic Multiple Living   3 1 1  0 2 2 0 0 0 1      Past Medical History  Diagnosis Date  . Migraine   . 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 GERD (gastroesophageal reflux disease)     Past Surgical History  Procedure Laterality Date  . No past surgeries      Family History  Problem Relation Age of Onset  . Anesthesia problems Neg Hx   . Hypertension Mother   . Asthma Brother     History  Substance Use Topics  . Smoking status: Current Every Day Smoker -- 0.25 packs/day    Types: Cigarettes, Cigars  . Smokeless tobacco: Never Used  . Alcohol Use: No    Allergies: No Known Allergies  Prescriptions prior to admission  Medication Sig Dispense Refill Last Dose  . Aspirin-Acetaminophen-Caffeine (GOODY HEADACHE PO) Take 1 Package by mouth daily as needed (For headache.).   09/29/2014 at Unknown time  . omeprazole (PRILOSEC) 40 MG capsule Take 40 mg by mouth daily.   09/29/2014 at Unknown time  . carisoprodol (SOMA) 350 MG tablet Take 1 tablet (350 mg total) by mouth 3 (three) times daily. (Patient not taking: Reported on 09/30/2014) 20 tablet 0 Not Taking at Unknown time  . HYDROcodone-acetaminophen (NORCO/VICODIN) 5-325 MG per tablet Take 1-2  tablets by mouth every 4 (four) hours as needed for moderate pain or severe pain. (Patient not taking: Reported on 09/30/2014) 15 tablet 0 Not Taking at Unknown time  . ibuprofen (ADVIL,MOTRIN) 600 MG tablet Take 1 tablet (600 mg total) by mouth every 6 (six) hours as needed. (Patient not taking: Reported on 09/30/2014) 30 tablet 0 Not Taking at Unknown time  . metroNIDAZOLE (FLAGYL) 500 MG tablet Take 1 tablet (500 mg total) by mouth 2 (two) times daily. (Patient not taking: Reported on 09/30/2014) 14 tablet 0 Completed Course at Unknown time  . norethindrone-ethinyl estradiol 1/35 (ORTHO-NOVUM 1/35, 28,) tablet Take 1 tablet by mouth daily. (Patient not taking: Reported on 09/30/2014) 1 Package 11 Not Taking at Unknown time    Review of Systems  Constitutional: Negative for fever and malaise/fatigue.  Gastrointestinal: Positive for abdominal pain. Negative for nausea, vomiting, diarrhea and constipation.  Genitourinary: Negative for dysuria, urgency and frequency.       Neg - vaginal bleeding + vaginal discharge   Physical Exam   Blood pressure 113/69, pulse 94, temperature 98.2 F (36.8 C), temperature source Oral, resp. rate 18, height 5' (1.524 m), weight 163 lb 6.4 oz (74.118 kg).  Physical Exam  Constitutional: She is oriented to person, place, and time. She appears well-developed  and well-nourished. No distress.  HENT:  Head: Normocephalic.  Cardiovascular: Normal rate.   Respiratory: Effort normal.  GI: Soft. She exhibits no distension and no mass. There is no tenderness. There is no rebound and no guarding.  Genitourinary:    Uterus is not enlarged and not tender. Cervix exhibits no motion tenderness, no discharge and no friability. Right adnexum displays no mass and no tenderness. Left adnexum displays no mass and no tenderness. No bleeding in the vagina. Vaginal discharge (small amount of thin, white discharge noted) found.  Neurological: She is alert and oriented to person,  place, and time.  Skin: Skin is warm and dry. No erythema.  Psychiatric: She has a normal mood and affect.   Results for orders placed or performed during the hospital encounter of 09/30/14 (from the past 24 hour(s))  Urinalysis, Routine w reflex microscopic     Status: None   Collection Time: 09/30/14  6:40 PM  Result Value Ref Range   Color, Urine YELLOW YELLOW   APPearance CLEAR CLEAR   Specific Gravity, Urine 1.020 1.005 - 1.030   pH 5.5 5.0 - 8.0   Glucose, UA NEGATIVE NEGATIVE mg/dL   Hgb urine dipstick NEGATIVE NEGATIVE   Bilirubin Urine NEGATIVE NEGATIVE   Ketones, ur NEGATIVE NEGATIVE mg/dL   Protein, ur NEGATIVE NEGATIVE mg/dL   Urobilinogen, UA 0.2 0.0 - 1.0 mg/dL   Nitrite NEGATIVE NEGATIVE   Leukocytes, UA NEGATIVE NEGATIVE  Pregnancy, urine POC     Status: None   Collection Time: 09/30/14  7:39 PM  Result Value Ref Range   Preg Test, Ur NEGATIVE NEGATIVE  Wet prep, genital     Status: Abnormal   Collection Time: 09/30/14  7:50 PM  Result Value Ref Range   Yeast Wet Prep HPF POC FEW (A) NONE SEEN   Trich, Wet Prep NONE SEEN NONE SEEN   Clue Cells Wet Prep HPF POC NONE SEEN NONE SEEN   WBC, Wet Prep HPF POC FEW (A) NONE SEEN    MAU Course  Procedures None  MDM UPT - negative UA, wet prep, GC/Chlamydia, RPR and HIV today   Assessment and Plan  A: Yeast vulvovaginitis HSV outbreak  P: Discharge home Rx for Valtrex given to patient Patient treated with Diflucan in MAU GC/Chlamydia, RPR and HIV pending Patient may return to MAU as needed or if her condition were to change or worsen   Marny Lowenstein, PA-C  09/30/2014, 8:17 PM

## 2014-09-30 NOTE — Discharge Instructions (Signed)
Candidal Vulvovaginitis Candidal vulvovaginitis is an infection of the vagina and vulva. The vulva is the skin around the opening of the vagina. This may cause itching and discomfort in and around the vagina.  HOME CARE  Only take medicine as told by your doctor.  Do not have sex (intercourse) until the infection is healed or as told by your doctor.  Practice safe sex.  Tell your sex partner about your infection.  Do not douche or use tampons.  Wear cotton underwear. Do not wear tight pants or panty hose.  Eat yogurt. This may help treat and prevent yeast infections. GET HELP RIGHT AWAY IF:   You have a fever.  Your problems get worse during treatment or do not get better in 3 days.  You have discomfort, irritation, or itching in your vagina or vulva area.  You have pain after sex.  You start to get belly (abdominal) pain. MAKE SURE YOU:  Understand these instructions.  Will watch your condition.  Will get help right away if you are not doing well or get worse. Document Released: 08/31/2008 Document Revised: 06/09/2013 Document Reviewed: 08/31/2008 [facility] Patient Information 2015 [place], [place]. This information is not intended to replace advice given to you by your health care provider. Make sure you discuss any questions you have with your health care provider. Genital Herpes Genital herpes is a sexually transmitted disease. This means that it is a disease passed by having sex with an infected person. There is no cure for genital herpes. The time between attacks can be months to years. The virus may live in a person but produce no problems (symptoms). This infection can be passed to a baby as it travels down the birth canal (vagina). In a newborn, this can cause central nervous system damage, eye damage, or even death. The virus that causes genital herpes is usually HSV-2 virus. The virus that causes oral herpes is usually HSV-1. The diagnosis (learning what is wrong) is  made through culture results. SYMPTOMS  Usually symptoms of pain and itching begin a few days to a week after contact. It first appears as small blisters that progress to small painful ulcers which then scab over and heal after several days. It affects the outer genitalia, birth canal, cervix, penis, anal area, buttocks, and thighs. HOME CARE INSTRUCTIONS   Keep ulcerated areas dry and clean.  Take medications as directed. Antiviral medications can speed up healing. They will not prevent recurrences or cure this infection. These medications can also be taken for suppression if there are frequent recurrences.  While the infection is active, it is contagious. Avoid all sexual contact during active infections.  Condoms may help prevent spread of the herpes virus.  Practice safe sex.  Wash your hands thoroughly after touching the genital area.  Avoid touching your eyes after touching your genital area.  Inform your caregiver if you have had genital herpes and become pregnant. It is your responsibility to insure a safe outcome for your baby in this pregnancy.  Only take over-the-counter or prescription medicines for pain, discomfort, or fever as directed by your caregiver. SEEK MEDICAL CARE IF:   You have a recurrence of this infection.  You do not respond to medications and are not improving.  You have new sources of pain or discharge which have changed from the original infection.  You have an oral temperature above 102 F (38.9 C).  You develop abdominal pain.  You develop eye pain or signs of eye infection.  Document Released: 06/01/2000 Document Revised: 08/27/2011 Document Reviewed: 06/22/2009 [facility],TheExitCare Patient Information 2015 [place], [place]. This information is not intended to replace advice given to you by your health care provider. Make sure you discuss any questions you have with your health care provider.

## 2014-10-01 LAB — RPR: RPR Ser Ql: NONREACTIVE

## 2014-10-01 LAB — HIV ANTIBODY (ROUTINE TESTING W REFLEX): HIV SCREEN 4TH GENERATION: NONREACTIVE

## 2014-10-01 LAB — GC/CHLAMYDIA PROBE AMP (~~LOC~~) NOT AT ARMC
CHLAMYDIA, DNA PROBE: NEGATIVE
NEISSERIA GONORRHEA: NEGATIVE

## 2014-10-21 ENCOUNTER — Encounter: Payer: Self-pay | Admitting: Medical

## 2014-10-21 ENCOUNTER — Ambulatory Visit (INDEPENDENT_AMBULATORY_CARE_PROVIDER_SITE_OTHER): Payer: Medicaid Other | Admitting: Medical

## 2014-10-21 ENCOUNTER — Other Ambulatory Visit (HOSPITAL_COMMUNITY)
Admission: RE | Admit: 2014-10-21 | Discharge: 2014-10-21 | Disposition: A | Discharge status: Home / Self Care | Payer: Medicaid Other | Source: Ambulatory Visit | Attending: Medical | Admitting: Medical

## 2014-10-21 VITALS — BP 111/64 | HR 80 | Temp 98.3°F | Ht 60.0 in | Wt 164.5 lb

## 2014-10-21 DIAGNOSIS — Z113 Encounter for screening for infections with a predominantly sexual mode of transmission: Secondary | ICD-10-CM | POA: Diagnosis not present

## 2014-10-21 DIAGNOSIS — Z01419 Encounter for gynecological examination (general) (routine) without abnormal findings: Secondary | ICD-10-CM | POA: Diagnosis not present

## 2014-10-21 DIAGNOSIS — Z0000 Encounter for general adult medical examination without abnormal findings: Secondary | ICD-10-CM

## 2014-10-21 NOTE — Progress Notes (Signed)
Patient here today for annual exam. Requests GC/chlamydia testing. Also c/o of thick white vaginal discharge with itching-- wet prep. C/o irregular periods-- recently started on OCPs-- would like to discuss this.

## 2014-10-21 NOTE — Progress Notes (Signed)
Patient ID: Stacey [name], female   DOB: 1991-03-01, 24 y.o.   MRN: [id number] Subjective:    Stacey [name] is a 24 y.o. female who presents for an annual exam. The patient has complaint of thick, white, itchy discharge. Patient was treated for yeast 09/30/14 in [facility] and states that she took Diflucan. Discharge improved, but has returned. Patient requests STD testing as well, although negative on 09/30/14 she would like to be re-tested. She states a recent history of irregular periods, but has recently re-started OCPs. Patient states that periods were regular until she started and stopped and restarted OCPs over the last 4-6 months. Currently has been on birth control less than one month. The patient is sexually active. GYN screening history: last pap: was normal, patient unsure of date. The patient wears seatbelts: yes. The patient participates in regular exercise: no. Has the patient ever been transfused or tattooed?: no blood transfusion, does have tattoos. The patient reports that there is not domestic violence in her life.   Menstrual History: OB History    Gravida Para Term Preterm AB TAB SAB Ectopic Multiple Living   3 1 1  0 2 2 0 0 0 1      Menarche age: 24 years old  Patient's last menstrual period was 10/20/2014. Period Duration (Days): 7-10 days  Period Pattern: (!) Irregular Menstrual Flow: Heavy Menstrual Control: Maxi pad Dysmenorrhea: (!) Moderate Dysmenorrhea Symptoms: Cramping, Throbbing  The following portions of the patient's history were reviewed and updated as appropriate: allergies, current medications, past family history, past medical history, past social history, past surgical history and problem list.  Review of Systems Pertinent items are noted in HPI.  All other ROS negative   Objective:     BP 111/64 mmHg  Pulse 80  Temp(Src) 98.3 F (36.8 C)  Ht 5' (1.524 m)  Wt 164 lb 8 oz (74.617 kg)  BMI 32.13 kg/m2  LMP 10/20/2014 CONSTITUTIONAL: Well-developed,  well-nourished female in no acute distress.  HENT:  Normocephalic, atraumatic, External right and left ear normal. Oropharynx is clear and moist EYES: Conjunctivae and EOM are normal. Pupils are equal, round, and reactive to light. No scleral icterus.  NECK: Normal range of motion, supple, no masses.  Normal thyroid.  SKIN: Skin is warm and dry. No rash noted. Not diaphoretic. No erythema. No pallor. NEUROLGIC: Alert and oriented to person, place, and time. Normal reflexes, muscle tone coordination.  PSYCHIATRIC: Normal mood and affect. Normal behavior. Normal judgment and thought content. CARDIOVASCULAR: Normal heart rate noted, regular rhythm, no murmurs or adventitious sounds RESPIRATORY: Clear to auscultation bilaterally. Effort and breath sounds normal, no problems with respiration noted. BREASTS: Symmetric in size. No masses, skin changes, nipple drainage, or lymphadenopathy. ABDOMEN: Soft, normal bowel sounds, no distention noted.  No tenderness, rebound or guarding.  PELVIC: Normal appearing external genitalia; normal appearing vaginal mucosa and cervix.  Normal appearing discharge.Scant light brown blood noted.  Pap smear and wet prep obtained.  Normal uterine size, no other palpable masses, no uterine or adnexal tenderness. MUSCULOSKELETAL: Normal range of motion. No tenderness.  No cyanosis, clubbing.   .    Assessment:    Healthy female exam.   STD testing Irregular menses   Plan:    1. Pap smear and STD testing today including wet prep, GC/Chlamydia, HIV, RPR, Hep B and Hep C 2. Patient recently started on OCPs in [facility]U. Advised to continue as prescribed and monitor bleeding pattern  3. Patient will be contacted with  any abnormal results 4. [facility] if bleeding remains unresponsive to OCPs, otherwise will follow-up in 1 year for annual exam.   Marny LowensteinJulie N Wenzel, PA-C 10/21/2014 2:09 PM

## 2014-10-21 NOTE — Patient Instructions (Signed)

## 2014-10-22 ENCOUNTER — Telehealth: Payer: Self-pay | Admitting: *Deleted

## 2014-10-22 ENCOUNTER — Other Ambulatory Visit: Payer: Self-pay | Admitting: Medical

## 2014-10-22 DIAGNOSIS — B9689 Other specified bacterial agents as the cause of diseases classified elsewhere: Secondary | ICD-10-CM

## 2014-10-22 DIAGNOSIS — N760 Acute vaginitis: Principal | ICD-10-CM

## 2014-10-22 LAB — WET PREP, GENITAL
Trich, Wet Prep: NONE SEEN
Yeast Wet Prep HPF POC: NONE SEEN

## 2014-10-22 LAB — HEPATITIS B SURFACE ANTIGEN: HEP B S AG: NEGATIVE

## 2014-10-22 LAB — RPR

## 2014-10-22 LAB — HIV ANTIBODY (ROUTINE TESTING W REFLEX): HIV 1&2 Ab, 4th Generation: NONREACTIVE

## 2014-10-22 LAB — HEPATITIS C ANTIBODY: HCV AB: NEGATIVE

## 2014-10-22 MED ORDER — METRONIDAZOLE 500 MG PO TABS: 500.0000 mg | ORAL_TABLET | Freq: Two times a day (BID) | ORAL | Status: DC

## 2014-10-22 MED ORDER — FLUCONAZOLE 150 MG PO TABS: 150.0000 mg | ORAL_TABLET | Freq: Once | ORAL | Status: DC

## 2014-10-22 NOTE — Telephone Encounter (Addendum)
-----   Message from Marny LowensteinJulie N Wenzel, PA-C sent at 10/22/2014  8:18 AM EDT ----- Patient has BV. Rx for Flagyl sent to her pharmacy. All other infection testing was negative. Please call patient and advise of diagnosis and Rx.   Thanks!  Raynelle FanningJulie  5/6  [phone]  Called pt and informed her of +BV requiring medication treatment. She was also informed that all other tests for infection were negative. Pt requested Rx for diflucan to take after Rx for BV because she says she always gets a yeast infection following the medication. I advised that I will send the Rx as requested and she should wait until the Rx for metronidazole is completed and then take the Diflucan. Pt voiced understanding of all information and instructions given.  Diane Day RNC

## 2014-10-25 LAB — CYTOLOGY - PAP

## 2014-11-12 ENCOUNTER — Ambulatory Visit: Payer: No Typology Code available for payment source | Attending: Orthopaedic Surgery | Admitting: Physical Therapy

## 2015-01-31 ENCOUNTER — Inpatient Hospital Stay (HOSPITAL_COMMUNITY)
Admission: AD | Admit: 2015-01-31 | Discharge: 2015-01-31 | Disposition: A | Discharge status: Home / Self Care | Payer: Medicaid Other | Source: Ambulatory Visit | Attending: Obstetrics & Gynecology | Admitting: Obstetrics & Gynecology

## 2015-01-31 ENCOUNTER — Encounter (HOSPITAL_COMMUNITY): Payer: Self-pay | Admitting: *Deleted

## 2015-01-31 DIAGNOSIS — R300 Dysuria: Secondary | ICD-10-CM | POA: Diagnosis present

## 2015-01-31 DIAGNOSIS — K219 Gastro-esophageal reflux disease without esophagitis: Secondary | ICD-10-CM | POA: Diagnosis not present

## 2015-01-31 DIAGNOSIS — F17210 Nicotine dependence, cigarettes, uncomplicated: Secondary | ICD-10-CM | POA: Diagnosis not present

## 2015-01-31 DIAGNOSIS — N760 Acute vaginitis: Secondary | ICD-10-CM | POA: Diagnosis present

## 2015-01-31 DIAGNOSIS — N898 Other specified noninflammatory disorders of vagina: Secondary | ICD-10-CM

## 2015-01-31 DIAGNOSIS — Z79899 Other long term (current) drug therapy: Secondary | ICD-10-CM | POA: Insufficient documentation

## 2015-01-31 DIAGNOSIS — B009 Herpesviral infection, unspecified: Secondary | ICD-10-CM | POA: Diagnosis not present

## 2015-01-31 DIAGNOSIS — Z3202 Encounter for pregnancy test, result negative: Secondary | ICD-10-CM

## 2015-01-31 LAB — WET PREP, GENITAL
Clue Cells Wet Prep HPF POC: NONE SEEN
Trich, Wet Prep: NONE SEEN
YEAST WET PREP: NONE SEEN

## 2015-01-31 LAB — URINE MICROSCOPIC-ADD ON

## 2015-01-31 LAB — URINALYSIS, ROUTINE W REFLEX MICROSCOPIC
BILIRUBIN URINE: NEGATIVE
Glucose, UA: NEGATIVE mg/dL
Ketones, ur: NEGATIVE mg/dL
NITRITE: NEGATIVE
PROTEIN: NEGATIVE mg/dL
UROBILINOGEN UA: 0.2 mg/dL (ref 0.0–1.0)
pH: 5.5 (ref 5.0–8.0)

## 2015-01-31 LAB — POCT PREGNANCY, URINE: Preg Test, Ur: NEGATIVE

## 2015-01-31 MED ORDER — VALACYCLOVIR HCL 1 G PO TABS: ORAL_TABLET | ORAL | Status: DC

## 2015-01-31 NOTE — Discharge Instructions (Signed)
Genital Herpes °Genital herpes is a sexually transmitted disease. This means that it is a disease passed by having sex with an infected person. There is no cure for genital herpes. The time between attacks can be months to years. The virus may live in a person but produce no problems (symptoms). This infection can be passed to a baby as it travels down the birth canal (vagina). In a newborn, this can cause central nervous system damage, eye damage, or even death. The virus that causes genital herpes is usually HSV-2 virus. The virus that causes oral herpes is usually HSV-1. The diagnosis (learning what is wrong) is made through culture results. °SYMPTOMS  °Usually symptoms of pain and itching begin a few days to a week after contact. It first appears as small blisters that progress to small painful ulcers which then scab over and heal after several days. It affects the outer genitalia, birth canal, cervix, penis, anal area, buttocks, and thighs. °HOME CARE INSTRUCTIONS  °· Keep ulcerated areas dry and clean. °· Take medications as directed. Antiviral medications can speed up healing. They will not prevent recurrences or cure this infection. These medications can also be taken for suppression if there are frequent recurrences. °· While the infection is active, it is contagious. Avoid all sexual contact during active infections. °· Condoms may help prevent spread of the herpes virus. °· Practice safe sex. °· Wash your hands thoroughly after touching the genital area. °· Avoid touching your eyes after touching your genital area. °· Inform your caregiver if you have had genital herpes and become pregnant. It is your responsibility to insure a safe outcome for your baby in this pregnancy. °· Only take over-the-counter or prescription medicines for pain, discomfort, or fever as directed by your caregiver. °SEEK MEDICAL CARE IF:  °· You have a recurrence of this infection. °· You do not respond to medications and are not  improving. °· You have new sources of pain or discharge which have changed from the original infection. °· You have an oral temperature above 102° F (38.9° C). °· You develop abdominal pain. °· You develop eye pain or signs of eye infection. °Document Released: 06/01/2000 Document Revised: 08/27/2011 Document Reviewed: 06/22/2009 °ExitCare® Patient Information ©2015 [employer]. This information is not intended to replace advice given to you by your health care provider. Make sure you discuss any questions you have with your health care provider. ° °

## 2015-01-31 NOTE — MAU Note (Addendum)
Vaginal itching and burning since last Wed.  Having pain and burning with urination, started the same time.  Was sure she had a yeast infection. Had been rx Diflucan - took it; didn't seem to help, symptoms seem worse.  Possible outbreak, has 2 sores, or it may be from scratching

## 2015-01-31 NOTE — MAU Provider Note (Signed)
History     CSN: [id number]  Arrival date and time: 01/31/15 1502   None     Chief Complaint  Patient presents with  . Dysuria  . Vaginal Itching   HPI Stacey [name] is 24 y.o. [phone] Unknown weeks presenting with vaginal itching/discharge and UTI sxs that began 6 days ago. She thought it was yeast, took Diflucan but sxs did not resolve.   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  She does not have Valtrex at home.  LMP 12/20/14 and is not sure if she is pregnant.   Past Medical History  Diagnosis Date  . Migraine   . 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 GERD (gastroesophageal reflux disease)     Past Surgical History  Procedure Laterality Date  . No past surgeries      Family History  Problem Relation Age of Onset  . Anesthesia problems Neg Hx   . Hypertension Mother   . Asthma Brother     Social History  Substance Use Topics  . Smoking status: Current Every Day Smoker -- 0.25 packs/day    Types: Cigarettes, Cigars  . Smokeless tobacco: Never Used  . Alcohol Use: Yes     Comment: occasionally    Allergies: No Known Allergies  Prescriptions prior to admission  Medication Sig Dispense Refill Last Dose  . fluconazole (DIFLUCAN) 150 MG tablet Take 1 tablet (150 mg total) by mouth once. 1 tablet 0 Past Week at Unknown time  . norethindrone-ethinyl estradiol 1/35 (ORTHO-NOVUM, NORTREL,CYCLAFEM) tablet Take 1 tablet by mouth daily.   Past Month at Unknown time  . Aspirin-Acetaminophen-Caffeine (GOODY HEADACHE PO) Take 1 Package by mouth daily as needed (For headache.).   More than a month at Unknown time  . metroNIDAZOLE (FLAGYL) 500 MG tablet Take 1 tablet (500 mg total) by mouth 2 (two) times daily. 14 tablet 0 More than a month at Unknown time  . omeprazole (PRILOSEC) 40 MG capsule Take 40 mg by mouth daily.   More than a month at Unknown time  . valACYclovir (VALTREX)  500 MG tablet Take 1 tablet (500 mg total) by mouth 2 (two) times daily. 6 tablet 2 More than a month at Unknown time    Review of Systems  Constitutional: Negative for fever and chills.  Respiratory: Negative for cough.   Cardiovascular: Negative for chest pain.  Gastrointestinal: Negative for nausea, vomiting and abdominal pain.  Genitourinary: Positive for dysuria, urgency, frequency and hematuria.       + for vaginal discharge/itching; sores on perinuem  Neurological: Negative for headaches.   Physical Exam   Blood pressure 117/57, pulse 81, temperature 98.4 F (36.9 C), temperature source Oral, resp. rate 18, weight 162 lb 6.4 oz (73.664 kg), last menstrual period 12/20/2014.  Physical Exam  Nursing note and vitals reviewed. Constitutional: She is oriented to person, place, and time. She appears well-developed and well-nourished.  HENT:  Head: Normocephalic.  Neck: Normal range of motion. Neck supple.  Cardiovascular: Normal rate and regular rhythm.   Respiratory: Effort normal. No respiratory distress.  GI: Soft. There is no tenderness.  Genitourinary: There is no rash, tenderness or lesion on the right labia. There is no rash, tenderness or lesion on the left labia. Cervix exhibits no motion tenderness, no discharge and no friability. There is tenderness in the vagina.  No bleeding in the vagina. Vaginal discharge (moderate amount of white discharge without odor.) found.  There are 3 tiny "splits" at the perineum that are tender on exam.  Neg for ulcerations/discharge. Bimanual exam deferred --patient denies pelvic pain  Musculoskeletal: Normal range of motion. She exhibits no edema.  Neurological: She is alert and oriented to person, place, and time.  Skin: Skin is warm and dry.  Psychiatric: She has a normal mood and affect. Her behavior is normal.    Results for orders placed or performed during the hospital encounter of 01/31/15 (from the past 24 hour(s))  Urinalysis,  Routine w reflex microscopic (not at [facility])     Status: Abnormal   Collection Time: 01/31/15  3:35 PM  Result Value Ref Range   Color, Urine YELLOW YELLOW   APPearance CLEAR CLEAR   Specific Gravity, Urine >1.030 (H) 1.005 - 1.030   pH 5.5 5.0 - 8.0   Glucose, UA NEGATIVE NEGATIVE mg/dL   Hgb urine dipstick TRACE (A) NEGATIVE   Bilirubin Urine NEGATIVE NEGATIVE   Ketones, ur NEGATIVE NEGATIVE mg/dL   Protein, ur NEGATIVE NEGATIVE mg/dL   Urobilinogen, UA 0.2 0.0 - 1.0 mg/dL   Nitrite NEGATIVE NEGATIVE   Leukocytes, UA SMALL (A) NEGATIVE  Urine microscopic-add on     Status: Abnormal   Collection Time: 01/31/15  3:35 PM  Result Value Ref Range   Squamous Epithelial / LPF FEW (A) RARE   WBC, UA 3-6 <3 WBC/hpf   RBC / HPF 0-2 <3 RBC/hpf  Pregnancy, urine POC     Status: None   Collection Time: 01/31/15  3:44 PM  Result Value Ref Range   Preg Test, Ur NEGATIVE NEGATIVE  Wet prep, genital     Status: Abnormal   Collection Time: 01/31/15  5:10 PM  Result Value Ref Range   Yeast Wet Prep HPF POC NONE SEEN NONE SEEN   Trich, Wet Prep NONE SEEN NONE SEEN   Clue Cells Wet Prep HPF POC NONE SEEN NONE SEEN   WBC, Wet Prep HPF POC FEW (A) NONE SEEN   MAU Course  Procedures  GC/CHL and HIV results pending  MDM MSE Labs Exam Rx for Valtrex  Assessment and Plan  A: Active HSV outbreak     Neg Wet Preg     Negative Pregnancy test  P:  Rx for Valtrex 1gm caplets #30.  Instructed to take 1/2 caplet bid X 3-5 days for outbreaks PRN     Discussed importance to continue care with GYN CLINIC  KEY,EVE M 01/31/2015, 5:51 PM

## 2015-02-01 ENCOUNTER — Ambulatory Visit (INDEPENDENT_AMBULATORY_CARE_PROVIDER_SITE_OTHER): Payer: Medicaid Other

## 2015-02-01 VITALS — BP 114/68 | HR 88 | Temp 98.5°F | Wt 164.5 lb

## 2015-02-01 DIAGNOSIS — A5402 Gonococcal vulvovaginitis, unspecified: Secondary | ICD-10-CM | POA: Diagnosis not present

## 2015-02-01 DIAGNOSIS — Z202 Contact with and (suspected) exposure to infections with a predominantly sexual mode of transmission: Secondary | ICD-10-CM

## 2015-02-01 DIAGNOSIS — A6009 Herpesviral infection of other urogenital tract: Secondary | ICD-10-CM | POA: Diagnosis present

## 2015-02-01 DIAGNOSIS — A5602 Chlamydial vulvovaginitis: Secondary | ICD-10-CM | POA: Diagnosis not present

## 2015-02-01 LAB — GC/CHLAMYDIA PROBE AMP (~~LOC~~) NOT AT ARMC
CHLAMYDIA, DNA PROBE: POSITIVE — AB
Neisseria Gonorrhea: POSITIVE — AB

## 2015-02-01 LAB — HIV ANTIBODY (ROUTINE TESTING W REFLEX): HIV Screen 4th Generation wRfx: NONREACTIVE

## 2015-02-01 MED ORDER — CEFTRIAXONE SODIUM 1 G IJ SOLR
250.0000 mg | Freq: Once | INTRAMUSCULAR | Status: AC
  Administered 2015-02-01: 250 mg via INTRAMUSCULAR

## 2015-02-01 MED ORDER — AZITHROMYCIN 250 MG PO TABS
1000.0000 mg | ORAL_TABLET | Freq: Once | ORAL | Status: AC
  Administered 2015-02-01: 1000 mg via ORAL

## 2015-02-14 ENCOUNTER — Emergency Department (HOSPITAL_COMMUNITY)
Admission: EM | Admit: 2015-02-14 | Discharge: 2015-02-15 | Disposition: A | Discharge status: Home / Self Care | Payer: Medicaid Other | Attending: Emergency Medicine | Admitting: Emergency Medicine

## 2015-02-14 ENCOUNTER — Encounter (HOSPITAL_COMMUNITY): Payer: Self-pay | Admitting: *Deleted

## 2015-02-14 DIAGNOSIS — Z8719 Personal history of other diseases of the digestive system: Secondary | ICD-10-CM | POA: Insufficient documentation

## 2015-02-14 DIAGNOSIS — Z720 Tobacco use: Secondary | ICD-10-CM | POA: Diagnosis not present

## 2015-02-14 DIAGNOSIS — Z79818 Long term (current) use of other agents affecting estrogen receptors and estrogen levels: Secondary | ICD-10-CM | POA: Insufficient documentation

## 2015-02-14 DIAGNOSIS — F332 Major depressive disorder, recurrent severe without psychotic features: Secondary | ICD-10-CM | POA: Insufficient documentation

## 2015-02-14 DIAGNOSIS — F322 Major depressive disorder, single episode, severe without psychotic features: Secondary | ICD-10-CM | POA: Diagnosis present

## 2015-02-14 DIAGNOSIS — Z8619 Personal history of other infectious and parasitic diseases: Secondary | ICD-10-CM | POA: Insufficient documentation

## 2015-02-14 DIAGNOSIS — F419 Anxiety disorder, unspecified: Secondary | ICD-10-CM | POA: Diagnosis not present

## 2015-02-14 DIAGNOSIS — F4312 Post-traumatic stress disorder, chronic: Secondary | ICD-10-CM | POA: Diagnosis not present

## 2015-02-14 DIAGNOSIS — F32A Depression, unspecified: Secondary | ICD-10-CM

## 2015-02-14 DIAGNOSIS — F329 Major depressive disorder, single episode, unspecified: Secondary | ICD-10-CM | POA: Diagnosis present

## 2015-02-14 DIAGNOSIS — Z8679 Personal history of other diseases of the circulatory system: Secondary | ICD-10-CM | POA: Diagnosis not present

## 2015-02-14 DIAGNOSIS — R51 Headache: Secondary | ICD-10-CM | POA: Insufficient documentation

## 2015-02-14 DIAGNOSIS — R45851 Suicidal ideations: Secondary | ICD-10-CM

## 2015-02-14 DIAGNOSIS — Z3202 Encounter for pregnancy test, result negative: Secondary | ICD-10-CM | POA: Diagnosis not present

## 2015-02-14 NOTE — ED Notes (Signed)
Pt states that she has always had some depression but that it has gotten worse today; pt states that she used to cut herself as a teenager but has not done it in a long time; pt denies SI but states that she has been having thoughts of cutting to help relieve the stress; pt denies HI

## 2015-02-15 DIAGNOSIS — R45851 Suicidal ideations: Secondary | ICD-10-CM | POA: Insufficient documentation

## 2015-02-15 DIAGNOSIS — F4312 Post-traumatic stress disorder, chronic: Secondary | ICD-10-CM

## 2015-02-15 DIAGNOSIS — F322 Major depressive disorder, single episode, severe without psychotic features: Secondary | ICD-10-CM | POA: Diagnosis not present

## 2015-02-15 DIAGNOSIS — F419 Anxiety disorder, unspecified: Secondary | ICD-10-CM | POA: Diagnosis not present

## 2015-02-15 DIAGNOSIS — F332 Major depressive disorder, recurrent severe without psychotic features: Secondary | ICD-10-CM | POA: Insufficient documentation

## 2015-02-15 LAB — I-STAT BETA HCG BLOOD, ED (MC, WL, AP ONLY): I-stat hCG, quantitative: <5 m[IU]/mL (ref <5)

## 2015-02-15 LAB — COMPREHENSIVE METABOLIC PANEL
ALK PHOS: 59 U/L (ref 38–126)
ALT: 19 U/L (ref 14–54)
ANION GAP: 6 (ref 5–15)
AST: 21 U/L (ref 15–41)
Albumin: 4.5 g/dL (ref 3.5–5.0)
BILIRUBIN TOTAL: 0.6 mg/dL (ref 0.3–1.2)
BUN: 7 mg/dL (ref 6–20)
CALCIUM: 9.4 mg/dL (ref 8.9–10.3)
CO2: 25 mmol/L (ref 22–32)
Chloride: 106 mmol/L (ref 101–111)
Creatinine, Ser: 0.69 mg/dL (ref 0.44–1.00)
Glucose, Bld: 93 mg/dL (ref 65–99)
POTASSIUM: 3.9 mmol/L (ref 3.5–5.1)
Sodium: 137 mmol/L (ref 135–145)
TOTAL PROTEIN: 7.6 g/dL (ref 6.5–8.1)

## 2015-02-15 LAB — CBC
HEMATOCRIT: 35.9 % — AB (ref 36.0–46.0)
HEMOGLOBIN: 12.1 g/dL (ref 12.0–15.0)
MCH: 28 pg (ref 26.0–34.0)
MCHC: 33.7 g/dL (ref 30.0–36.0)
MCV: 83.1 fL (ref 78.0–100.0)
Platelets: 374 10*3/uL (ref 150–400)
RBC: 4.32 MIL/uL (ref 3.87–5.11)
RDW: 13.7 % (ref 11.5–15.5)
WBC: 7.5 10*3/uL (ref 4.0–10.5)

## 2015-02-15 LAB — RAPID URINE DRUG SCREEN, HOSP PERFORMED
AMPHETAMINES: NOT DETECTED
BARBITURATES: NOT DETECTED
BENZODIAZEPINES: NOT DETECTED
COCAINE: NOT DETECTED
Opiates: NOT DETECTED
Tetrahydrocannabinol: NOT DETECTED

## 2015-02-15 MED ORDER — ACETAMINOPHEN 325 MG PO TABS
650.0000 mg | ORAL_TABLET | ORAL | Status: DC | PRN
  Administered 2015-02-15: 650 mg via ORAL
  Filled 2015-02-15: qty 2

## 2015-02-15 MED ORDER — ONDANSETRON HCL 4 MG PO TABS: 4.0000 mg | ORAL_TABLET | Freq: Three times a day (TID) | ORAL | Status: DC | PRN

## 2015-02-15 MED ORDER — NICOTINE 21 MG/24HR TD PT24: 21.0000 mg | MEDICATED_PATCH | Freq: Every day | TRANSDERMAL | Status: DC

## 2015-02-15 NOTE — ED Notes (Signed)
Pt presents with depression periodically increasing in severity for last 2 days.  Pt sad and tearful.  Pt reports she is having the desire to cut self to but has not acted on this.  Admits to cutting herself in teen years.  Denies SI, HI or AV hallucinations.  Pt reports feeling hopeless and overwhelmed.  Pt states she currently is not working or in school and lives from house to house at present.  Pt reports she has a 23 year old daughter in a safe place, currently staying with the grandmother.  Denies drug use and admits to occasional alcohol use.  Pt reports she has never been evaluated by a Psychiatrist or prescribed meds for depression.  Pt AAO x 3, no distress noted, calm & cooperative, monitoring for safety, Q 15 min checks in effect.  Pt being eval by TTS Berna Spare at present.

## 2015-02-15 NOTE — ED Provider Notes (Signed)
CSN: [id number]     Arrival date & time 02/14/15  2259 History   First MD Initiated Contact with Patient 02/15/15 0128     Chief Complaint  Patient presents with  . Depression     (Consider location/radiation/quality/duration/timing/severity/associated sxs/prior Treatment) Patient is a 24 y.o. female presenting with depression. The history is provided by the patient. No language interpreter was used.  Depression This is a new problem. Associated symptoms include headaches. Pertinent negatives include no chills or fever. Associated symptoms comments: Patient presents with complaint of depression, suicidal thoughts without formed plan. She reports history of being a cutter in the past and feels like she wants to hurt herself. She denies any self injury or suicide attempt. No substance abuse issues. .    Past Medical History  Diagnosis Date  . Migraine   . 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 GERD (gastroesophageal reflux disease)    Past Surgical History  Procedure Laterality Date  . No past surgeries     Family History  Problem Relation Age of Onset  . Anesthesia problems Neg Hx   . Hypertension Mother   . Asthma Brother    Social History  Substance Use Topics  . Smoking status: Current Every Day Smoker -- 0.25 packs/day    Types: Cigars  . Smokeless tobacco: Never Used  . Alcohol Use: Yes     Comment: occasionally   OB History    Gravida Para Term Preterm AB TAB SAB Ectopic Multiple Living   0 2 2 0 0 0 1     Review of Systems  Constitutional: Negative for fever and chills.  Respiratory: Negative.   Cardiovascular: Negative.   Gastrointestinal: Negative.   Musculoskeletal: Negative.   Skin: Negative.   Neurological: Positive for headaches.  Psychiatric/Behavioral: Positive for depression, suicidal ideas and dysphoric mood.      Allergies  Review of patient's allergies indicates no known allergies.  Home Medications   Prior to Admission  medications   Medication Sig Start Date End Date Taking? Authorizing Provider  norethindrone-ethinyl estradiol 1/35 (ORTHO-NOVUM, NORTREL,CYCLAFEM) tablet Take 1 tablet by mouth daily.    Historical Provider, MD  valACYclovir (VALTREX) 1000 MG tablet Take 1/2 tablet po bid X 3-5 days prn 01/31/15   Verita Schneiders Key, NP   BP 111/74 mmHg  Pulse 87  Temp(Src) 98.6 F (37 C) (Oral)  Resp 18  SpO2 100%  LMP 02/05/2015 Physical Exam  Constitutional: She is oriented to person, place, and time. She appears well-developed and well-nourished.  HENT:  Head: Normocephalic.  Neck: Normal range of motion. Neck supple.  Cardiovascular: Normal rate and regular rhythm.   Pulmonary/Chest: Effort normal and breath sounds normal.  Abdominal: Soft. Bowel sounds are normal. There is no tenderness. There is no rebound and no guarding.  Musculoskeletal: Normal range of motion.  Neurological: She is alert and oriented to person, place, and time.  Skin: Skin is warm and dry. No rash noted.  Psychiatric: Her speech is normal. Cognition and memory are normal. She exhibits a depressed mood. She expresses suicidal ideation. She expresses no homicidal ideation.    ED Course  Procedures (including critical care time) Labs Review Labs Reviewed  CBC - Abnormal; Notable for the following:    HCT 35.9 (*)    All other components within normal limits  COMPREHENSIVE METABOLIC PANEL  ETHANOL  SALICYLATE LEVEL  ACETAMINOPHEN LEVEL  URINE RAPID DRUG SCREEN, HOSP PERFORMED  I-STAT  BETA HCG BLOOD, ED ([facility], [facility], [facility] ONLY)    Imaging Review No results found. I have personally reviewed and evaluated these images and lab results as part of my medical decision-making.   EKG Interpretation None      MDM   Final diagnoses:  None    1. Suicidal ideation 2. Depression  She will need TTS consultation to determine disposition but feel she would benefit from inpatient treatment.     Elpidio Anis, PA-C 02/15/15  [phone]  Tomasita Crumble, MD 02/15/15 [phone]

## 2015-02-15 NOTE — ED Notes (Signed)
Pt wanded by security. 

## 2015-02-15 NOTE — [facility] Suicide Risk Assessment (Cosign Needed)
Suicide Risk Assessment  Discharge Assessment   [facility] Discharge Suicide Risk Assessment   Demographic Factors:  Low socioeconomic status and Unemployed  Total Time spent with patient: 20 minutes  Musculoskeletal: Strength & Muscle Tone: within normal limits Gait & Station: normal Patient leans: N/A  Psychiatric Specialty Exam:     Blood pressure 117/66, pulse 80, temperature 98.1 F (36.7 [name]), temperature source Oral, resp. rate 18, last menstrual period 02/05/2015, SpO2 100 %.There is no weight on file to calculate BMI.  General Appearance: Casual and Fairly Groomed  Eye Contact::  Good  Speech:  Clear and Coherent and Garbled  Volume:  Normal  Mood:  Anxious, Depressed, Hopeless and helpless  Affect:  Congruent and Depressed  Thought Process:  Coherent, Goal Directed and Intact  Orientation:  Full (Time, Place, and Person)  Thought Content:  WDL  Suicidal Thoughts:  No  Homicidal Thoughts:  No  Memory:  Immediate;   Good Recent;   Good Remote;   Good  Judgement:  Fair  Insight:  Fair  Psychomotor Activity:  Normal  Concentration:  Good  Recall:  NA  Fund of Knowledge:Good  Language: Good  Akathisia:  NA  Handed:  Right  AIMS (if indicated):     Assets:  Desire for Improvement  ADL's:  Intact  Cognition: WNL        Has this patient used any form of tobacco in the last 30 days? (Cigarettes, Smokeless Tobacco, Cigars, and/or Pipes) Yes, A prescription for an FDA-approved tobacco cessation medication was offered at discharge and the patient refused  Mental Status Per Nursing Assessment::   On Admission:     Current Mental Status by Physician: NA  Loss Factors: Financial problems/change in socioeconomic status  Historical Factors: used to cut wrist as a teenager to relieve stress.  Risk Reduction Factors:   Responsible for children under 70 years of age  Continued Clinical Symptoms:  Depression:   Insomnia  Cognitive Features That Contribute To Risk:   Polarized thinking    Suicide Risk:  Minimal: No identifiable suicidal ideation.  Patients presenting with no risk factors but with morbid ruminations; may be classified as minimal risk based on the severity of the depressive symptoms  Principal Problem: Major depressive disorder, single episode, severe without psychosis Discharge Diagnoses:  Patient Active Problem List   Diagnosis Date Noted  . Major depressive disorder, single episode, severe without psychosis [F32.2] 02/15/2015    Priority: High  . Smoker [Z72.0] 07/04/2014      Plan Of Care/Follow-up recommendations:  Activity:  as tolerated Diet:  regular  Is patient on multiple antipsychotic therapies at discharge:  No   Has Patient had three or more failed trials of antipsychotic monotherapy by history:  No  Recommended Plan for Multiple Antipsychotic Therapies: NA    Stacey [name]   PMHNP-BC 02/15/2015, 10:31 AM
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
## 2015-02-15 NOTE — BH Assessment (Signed)
[facility] Assessment Progress Note  Per Thedore Mins, MD, this pt does not require psychiatric hospitalization at this time.  Pt is to be discharged from [facility] with outpatient referrals.  Discharge instructions include referral information for [facility] and for [place].  Pt's nurse has been notified.  Doylene Canning, MA Triage Specialist [phone]

## 2015-02-15 NOTE — ED Notes (Signed)
Bed: [facility] Expected date:  Expected time:  Means of arrival:  Comments: Tr

## 2015-02-15 NOTE — Discharge Instructions (Signed)
For your ongoing mental health needs, you are advised to follow up with one of the following providers:         [facility] [place]      [address]      [place], [place] [address]      [phone]      New patients are seen at their walk-in clinic.  Walk-in hours are Monday - Friday from 8:00 am - 12:00 pm, and from 1:00 pm - 3:00 pm.  Walk-in patients are seen on a first come, first served basis, so try to arrive as early as possible for the best chance of being seen the same day.  There is an initial fee of $22.50.       [facility]      [address]      [place], [place] [address]      [phone]      New and returning patients are seen at their walk-in clinic.  Walk-in hours are Monday - Friday from 8:00 am - 3:00 pm.  Walk-in patients are seen on a first come, first served basis.  Try to arrive as early as possible for he best chance of being seen the same day.

## 2015-02-24 ENCOUNTER — Ambulatory Visit: Payer: Self-pay | Admitting: Obstetrics & Gynecology

## 2015-03-14 ENCOUNTER — Ambulatory Visit (INDEPENDENT_AMBULATORY_CARE_PROVIDER_SITE_OTHER): Payer: Medicaid Other | Admitting: Medical

## 2015-03-14 ENCOUNTER — Encounter: Payer: Self-pay | Admitting: Medical

## 2015-03-14 ENCOUNTER — Other Ambulatory Visit (HOSPITAL_COMMUNITY)
Admission: RE | Admit: 2015-03-14 | Discharge: 2015-03-14 | Disposition: A | Discharge status: Home / Self Care | Payer: Medicaid Other | Source: Ambulatory Visit | Attending: Medical | Admitting: Medical

## 2015-03-14 VITALS — BP 128/77 | HR 89 | Temp 98.9°F | Ht 60.0 in | Wt 158.4 lb

## 2015-03-14 DIAGNOSIS — N898 Other specified noninflammatory disorders of vagina: Secondary | ICD-10-CM

## 2015-03-14 DIAGNOSIS — Z113 Encounter for screening for infections with a predominantly sexual mode of transmission: Secondary | ICD-10-CM | POA: Diagnosis not present

## 2015-03-14 NOTE — Progress Notes (Signed)
Patient ID: ICESIS RENN, female   DOB: Feb 27, 1991, 24 y.o.   MRN: [id number]  History:  Ms. TORII ROYSE is a 24 y.o. [id number] who presents to clinic today for continued vaginal discharge. The patient was seen in [facility] on 01/31/15 for vaginal discharge. Wet prep was negative, but cultures for GC/Chlamydia were both positive. Patient was treated in [facility] on 02/01/15. She states that she has continued to notice a small amount of thick, white discharge vaginally. She denies itching or odor. She states that she had spotting x 1 week prior to her period this month. This was the first month she has restarted on OCPs. She states LMP 03/07/15. She has had sex once since STD treatment with a condom and the same partner.    Patient Active Problem List   Diagnosis Date Noted  . Major depressive disorder, single episode, severe without psychosis 02/15/2015  . Major depressive disorder, recurrent, severe without psychotic features   . Suicidal ideation   . Smoker 07/04/2014    No Known Allergies  Current Outpatient Prescriptions on File Prior to Visit  Medication Sig Dispense Refill  . norethindrone-ethinyl estradiol 1/35 (ORTHO-NOVUM, NORTREL,CYCLAFEM) tablet Take 1 tablet by mouth daily.    . valACYclovir (VALTREX) 1000 MG tablet Take 1/2 tablet po bid X 3-5 days prn (Patient taking differently: Take 500 mg by mouth 3 (three) times daily as needed. For outbreaks) 30 tablet 0   No current facility-administered medications on file prior to visit.     The following portions of the patient's history were reviewed and updated as appropriate: allergies, current medications, family history, past medical history, social history, past surgical history and problem list.  Review of Systems:  Other than those mentioned in HPI all ROS negative  Objective:  Physical Exam LMP 02/05/2015 CONSTITUTIONAL: Well-developed, well-nourished female in no acute distress.  EYES: EOM intact, conjunctivae normal, no scleral  icterus HEAD: Normocephalic, atraumatic ENT: External right and left ear normal, oropharynx is clear and moist. CARDIOVASCULAR: Normal heart rate noted RESPIRATORY: Effort normal, no problems with respiration noted. GASTROINTESTINAL:Soft, no distention noted.  No tenderness, rebound or guarding.  GENITOURINARY: Normal appearing external genitalia; normal appearing vaginal mucosa and cervix.  Small amount of thin, white discharge.  Normal uterine size, no other palpable masses, no uterine or adnexal tenderness. MUSCULOSKELETAL: Normal range of motion. SKIN: Skin is warm and dry. No rash noted. Not diaphoretic. No erythema. No pallor. NEUROLGIC: Alert and oriented to person, place, and time.  PSYCHIATRIC: Normal mood and affect. Normal behavior. Normal judgment and thought content.  MDM Wet prep and GC/Chlamydia today for evaluation of vaginal discharge and TOC.   Assessment & Plan:  Assessment: Vaginal discharge  Plans: Wet prep and GC/Chlamhydia sent to lab Patient will be contacted with any abnormal results. If testing is negative for infection, change is discharge is most likely due to hormonal changes associated with restarting OCPs Patient to return to [facility] in 1 year for annual exam and OCP refill or sooner if symptoms were to change or worsen  Marny Lowenstein, PA-C 03/14/2015 3:04 PM

## 2015-03-14 NOTE — Patient Instructions (Signed)
Sexually Transmitted Disease A sexually transmitted disease (STD) is a disease or infection often passed to another person during sex. However, STDs can be passed through nonsexual ways. An STD can be passed through:  Spit (saliva).  Semen.  Blood.  Mucus from the vagina.  Pee (urine). HOW CAN I LESSEN MY CHANCES OF GETTING AN STD?  Use:  Latex condoms.  Water-soluble lubricants with condoms. Do not use petroleum jelly or oils.  Dental dams. These are small pieces of latex that are used as a barrier during oral sex.  Avoid having more than one sex partner.  Do not have sex with someone who has other sex partners.  Do not have sex with anyone you do not know or who is at high risk for an STD.  Avoid risky sex that can break your skin.  Do not have sex if you have open sores on your mouth or skin.  Avoid drinking too much alcohol or taking illegal drugs. Alcohol and drugs can affect your good judgment.  Avoid oral and anal sex acts.  Get shots (vaccines) for HPV and hepatitis.  If you are at risk of being infected with HIV, it is advised that you take a certain medicine daily to prevent HIV infection. This is called pre-exposure prophylaxis (PrEP). You may be at risk if:  You are a man who has sex with other men (MSM).  You are attracted to the opposite sex (heterosexual) and are having sex with more than one partner.  You take drugs with a needle.  You have sex with someone who has HIV.  Talk with your doctor about if you are at high risk of being infected with HIV. If you begin to take PrEP, get tested for HIV first. Get tested every 3 months for as long as you are taking PrEP. WHAT SHOULD I DO IF I THINK I HAVE AN STD?  See your doctor.  Tell your sex partner(s) that you have an STD. They should be tested and treated.  Do not have sex until your doctor says it is okay. WHEN SHOULD I GET HELP? Get help right away if:  You have bad belly (abdominal)  pain.  You are a man and have puffiness (swelling) or pain in your testicles.  You are a woman and have puffiness in your vagina. Document Released: 07/12/2004 Document Revised: 06/09/2013 Document Reviewed: 11/28/2012 Exit[employer] Patient Information 2015 [employer]. This information is not intended to replace advice given to you by your health care provider. Make sure you discuss any questions you have with your health care provider.  

## 2015-03-15 LAB — GC/CHLAMYDIA PROBE AMP (~~LOC~~) NOT AT ARMC
Chlamydia: NEGATIVE
NEISSERIA GONORRHEA: NEGATIVE

## 2015-03-15 LAB — WET PREP, GENITAL
CLUE CELLS WET PREP: NONE SEEN
Trich, Wet Prep: NONE SEEN
Yeast Wet Prep HPF POC: NONE SEEN

## 2015-03-21 ENCOUNTER — Telehealth: Payer: Self-pay | Admitting: *Deleted

## 2015-03-21 NOTE — Telephone Encounter (Signed)
Pt contacted the clinic requesting results.  Attempted to contact patient, no answer, left message for patient to return call to clinic for results or to give permission for results to be left on her secure voicemail.

## 2015-03-21 NOTE — Telephone Encounter (Signed)
Received message from patient giving ok to leave voice mail message. Called patient and she answered. Relayed negative gonorrhea and chlamydia result, negative wet prep. Patient voiced understanding.

## 2015-04-03 ENCOUNTER — Encounter (HOSPITAL_COMMUNITY): Payer: Self-pay

## 2015-04-03 ENCOUNTER — Emergency Department (HOSPITAL_COMMUNITY)
Admission: EM | Admit: 2015-04-03 | Discharge: 2015-04-03 | Disposition: A | Discharge status: Home / Self Care | Payer: Medicaid Other | Attending: Emergency Medicine | Admitting: Emergency Medicine

## 2015-04-03 ENCOUNTER — Emergency Department (HOSPITAL_COMMUNITY): Payer: Medicaid Other

## 2015-04-03 DIAGNOSIS — R05 Cough: Secondary | ICD-10-CM | POA: Diagnosis present

## 2015-04-03 DIAGNOSIS — Z3202 Encounter for pregnancy test, result negative: Secondary | ICD-10-CM | POA: Diagnosis not present

## 2015-04-03 DIAGNOSIS — B349 Viral infection, unspecified: Secondary | ICD-10-CM

## 2015-04-03 DIAGNOSIS — Z8679 Personal history of other diseases of the circulatory system: Secondary | ICD-10-CM | POA: Insufficient documentation

## 2015-04-03 DIAGNOSIS — M545 Low back pain: Secondary | ICD-10-CM | POA: Insufficient documentation

## 2015-04-03 DIAGNOSIS — Z720 Tobacco use: Secondary | ICD-10-CM | POA: Insufficient documentation

## 2015-04-03 DIAGNOSIS — Z8719 Personal history of other diseases of the digestive system: Secondary | ICD-10-CM | POA: Diagnosis not present

## 2015-04-03 DIAGNOSIS — Z79899 Other long term (current) drug therapy: Secondary | ICD-10-CM | POA: Diagnosis not present

## 2015-04-03 LAB — URINE MICROSCOPIC-ADD ON

## 2015-04-03 LAB — CBC
HCT: 35 % — ABNORMAL LOW (ref 36.0–46.0)
Hemoglobin: 11.7 g/dL — ABNORMAL LOW (ref 12.0–15.0)
MCH: 27.8 pg (ref 26.0–34.0)
MCHC: 33.4 g/dL (ref 30.0–36.0)
MCV: 83.1 fL (ref 78.0–100.0)
PLATELETS: 345 10*3/uL (ref 150–400)
RBC: 4.21 MIL/uL (ref 3.87–5.11)
RDW: 13.2 % (ref 11.5–15.5)
WBC: 7.2 10*3/uL (ref 4.0–10.5)

## 2015-04-03 LAB — COMPREHENSIVE METABOLIC PANEL
ALK PHOS: 49 U/L (ref 38–126)
ALT: 13 U/L — ABNORMAL LOW (ref 14–54)
ANION GAP: 9 (ref 5–15)
AST: 21 U/L (ref 15–41)
Albumin: 3.8 g/dL (ref 3.5–5.0)
BILIRUBIN TOTAL: 0.4 mg/dL (ref 0.3–1.2)
BUN: 6 mg/dL (ref 6–20)
CALCIUM: 9.1 mg/dL (ref 8.9–10.3)
CO2: 21 mmol/L — ABNORMAL LOW (ref 22–32)
Chloride: 104 mmol/L (ref 101–111)
Creatinine, Ser: 0.76 mg/dL (ref 0.44–1.00)
GLUCOSE: 113 mg/dL — AB (ref 65–99)
POTASSIUM: 3.4 mmol/L — AB (ref 3.5–5.1)
Sodium: 134 mmol/L — ABNORMAL LOW (ref 135–145)
Total Protein: 7.5 g/dL (ref 6.5–8.1)

## 2015-04-03 LAB — URINALYSIS, ROUTINE W REFLEX MICROSCOPIC
Bilirubin Urine: NEGATIVE
GLUCOSE, UA: NEGATIVE mg/dL
KETONES UR: NEGATIVE mg/dL
LEUKOCYTES UA: NEGATIVE
Nitrite: NEGATIVE
PH: 5.5 (ref 5.0–8.0)
Protein, ur: NEGATIVE mg/dL
Specific Gravity, Urine: 1.025 (ref 1.005–1.030)
Urobilinogen, UA: 0.2 mg/dL (ref 0.0–1.0)

## 2015-04-03 LAB — LIPASE, BLOOD: Lipase: 31 U/L (ref 22–51)

## 2015-04-03 LAB — POC URINE PREG, ED: Preg Test, Ur: NEGATIVE

## 2015-04-03 MED ORDER — SODIUM CHLORIDE 0.9 % IV BOLUS (SEPSIS)
1000.0000 mL | Freq: Once | INTRAVENOUS | Status: AC
  Administered 2015-04-03: 1000 mL via INTRAVENOUS

## 2015-04-03 MED ORDER — MELOXICAM 15 MG PO TABS: 15.0000 mg | ORAL_TABLET | Freq: Every day | ORAL | Status: DC

## 2015-04-03 MED ORDER — KETOROLAC TROMETHAMINE 30 MG/ML IJ SOLN
30.0000 mg | Freq: Once | INTRAMUSCULAR | Status: AC
  Administered 2015-04-03: 30 mg via INTRAVENOUS
  Filled 2015-04-03: qty 1

## 2015-04-03 MED ORDER — DEXTROMETHORPHAN POLISTIREX ER 30 MG/5ML PO SUER: 30.0000 mg | ORAL | Status: DC | PRN

## 2015-04-03 MED ORDER — ONDANSETRON HCL 4 MG/2ML IJ SOLN
4.0000 mg | Freq: Once | INTRAMUSCULAR | Status: AC
  Administered 2015-04-03: 4 mg via INTRAVENOUS
  Filled 2015-04-03: qty 2

## 2015-04-03 NOTE — ED Provider Notes (Signed)
CSN: [id number]     Arrival date & time 04/03/15  1910 History   First MD Initiated Contact with Patient 04/03/15 2010     Chief Complaint  Patient presents with  . Cough  . Emesis     (Consider location/radiation/quality/duration/timing/severity/associated sxs/prior Treatment) Patient is a 24 y.o. female presenting with cough and vomiting. The history is provided by the patient. No language interpreter was used.  Cough Cough characteristics:  Hacking Severity:  Moderate Onset quality:  Gradual Duration:  1 week Timing:  Constant Progression:  Unchanged Chronicity:  New Smoker: yes   Context: upper respiratory infection   Context: not animal exposure, not exposure to allergens, not fumes, not occupational exposure, not sick contacts, not smoke exposure, not weather changes and not with activity   Relieved by:  Nothing Worsened by:  Nothing tried Ineffective treatments:  None tried Associated symptoms: no chest pain, no diaphoresis, no fever, no headaches, no myalgias, no sore throat and no wheezing   Risk factors: no chemical exposure, no recent infection and no recent travel   Emesis Severity:  Mild Duration:  1 day Timing:  Constant Number of daily episodes:  3 Quality:  Stomach contents Progression:  Resolved Chronicity:  New Recent urination:  Normal Context: not post-tussive and not self-induced   Relieved by:  Nothing Worsened by:  Nothing tried Ineffective treatments:  None tried Associated symptoms: URI   Associated symptoms: no cough, no diarrhea, no fever, no headaches, no myalgias and no sore throat   Risk factors: no alcohol use, no diabetes, no prior abdominal surgery, no sick contacts, no suspect food intake and no travel to endemic areas     Past Medical History  Diagnosis Date  . Migraine   . 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 GERD (gastroesophageal reflux disease)    Past Surgical History  Procedure Laterality Date  . No past surgeries      Family History  Problem Relation Age of Onset  . Anesthesia problems Neg Hx   . Hypertension Mother   . Asthma Brother    Social History  Substance Use Topics  . Smoking status: Current Every Day Smoker -- 0.25 packs/day    Types: Cigars  . Smokeless tobacco: Never Used  . Alcohol Use: Yes     Comment: occasionally   OB History    Gravida Para Term Preterm AB TAB SAB Ectopic Multiple Living   0 2 2 0 0 0 1     Review of Systems  Constitutional: Negative for fever and diaphoresis.  HENT: Negative for sore throat.   Respiratory: Positive for cough. Negative for wheezing.   Cardiovascular: Negative for chest pain.  Gastrointestinal: Positive for nausea and vomiting. Negative for diarrhea.  Musculoskeletal: Positive for back pain. Negative for myalgias.  Neurological: Negative for headaches.  All other systems reviewed and are negative.     Allergies  Review of patient's allergies indicates no known allergies.  Home Medications   Prior to Admission medications   Medication Sig Start Date End Date Taking? Authorizing Provider  norethindrone-ethinyl estradiol 1/35 (ORTHO-NOVUM, NORTREL,CYCLAFEM) tablet Take 1 tablet by mouth daily.    Historical Provider, MD  valACYclovir (VALTREX) 1000 MG tablet Take 1/2 tablet po bid X 3-5 days prn Patient taking differently: Take 500 mg by mouth 3 (three) times daily as needed. For outbreaks 01/31/15   Verita Schneiders Key, NP   BP 102/50 mmHg  Pulse 100  Temp(Src)  98.9 F (37.2 C) (Oral)  Resp 19  Ht 5' (1.524 m)  Wt 163 lb (73.936 kg)  BMI 31.83 kg/m2  SpO2 100%  LMP 03/07/2015 Physical Exam  Constitutional: She is oriented to person, place, and time. She appears well-developed and well-nourished. No distress.  Patient coughing  HENT:  Head: Normocephalic and atraumatic.  Eyes: Conjunctivae and EOM are normal.  Neck: Normal range of motion.  Cardiovascular: Normal rate and regular rhythm.  Exam reveals no gallop and no  friction rub.   No murmur heard. Pulmonary/Chest: Effort normal and breath sounds normal. She has no wheezes. She has no rales. She exhibits no tenderness.  Abdominal: Soft. She exhibits no distension. There is no tenderness.  Musculoskeletal: Normal range of motion.  Neurological: She is alert and oriented to person, place, and time. Coordination normal.  Speech is goal-oriented. Moves limbs without ataxia.   Skin: Skin is warm and dry.  Psychiatric: She has a normal mood and affect. Her behavior is normal.  Nursing note and vitals reviewed.   ED Course  Procedures (including critical care time) Labs Review Labs Reviewed  COMPREHENSIVE METABOLIC PANEL - Abnormal; Notable for the following:    Sodium 134 (*)    Potassium 3.4 (*)    CO2 21 (*)    Glucose, Bld 113 (*)    ALT 13 (*)    All other components within normal limits  CBC - Abnormal; Notable for the following:    Hemoglobin 11.7 (*)    HCT 35.0 (*)    All other components within normal limits  LIPASE, BLOOD  URINALYSIS, ROUTINE W REFLEX MICROSCOPIC (NOT AT [facility])  POC URINE PREG, ED    Imaging Review Dg Chest 2 View  04/03/2015  CLINICAL DATA:  Dry cough for 1 week. Nausea, vomiting, back pain and chest tightness with cough. EXAM: CHEST  2 VIEW COMPARISON:  05/17/2013 FINDINGS: The cardiomediastinal contours are normal. The lungs are clear. Pulmonary vasculature is normal. No consolidation, pleural effusion, or pneumothorax. No acute osseous abnormalities are seen. IMPRESSION: No acute pulmonary process. Electronically Signed   By: Rubye OaksMelanie  Ehinger M.D.   On: 04/03/2015 20:52   I have personally reviewed and evaluated these images and lab results as part of my medical decision-making.   EKG Interpretation None      MDM   Final diagnoses:  Viral illness    10:39 PM Labs, urinalysis and chest xray unremarkable for acute changes. Patient likely has a viral illness and will be discharged with symptomatic relief.  Vitals stable and patient afebrile.    Emilia BeckKaitlyn Kayhan Boardley, PA-C 04/03/15 2312  Arby BarretteMarcy Pfeiffer, MD 04/09/15 [phone]

## 2015-04-03 NOTE — Discharge Instructions (Signed)
Take delsym as needed for cough. Take mobic as needed for pain. Refer to attached documents for more information. Return to with worsening or concerning symptoms.

## 2015-04-03 NOTE — ED Notes (Signed)
Discharge instructions/prescriptions reviewed. Understanding verbalized. Patient declined wheelchair at time of discharge. No acute distress noted.

## 2015-04-03 NOTE — ED Notes (Signed)
Pt reports dry cough, onset 1 week ago, nausea, vomiting, back pain and chest tightness with cough.

## 2015-06-01 ENCOUNTER — Ambulatory Visit (INDEPENDENT_AMBULATORY_CARE_PROVIDER_SITE_OTHER): Payer: Medicaid Other | Admitting: Obstetrics & Gynecology

## 2015-06-01 ENCOUNTER — Encounter: Payer: Self-pay | Admitting: Obstetrics & Gynecology

## 2015-06-01 VITALS — BP 126/74 | HR 86 | Temp 98.5°F | Ht 60.0 in | Wt 154.0 lb

## 2015-06-01 DIAGNOSIS — N920 Excessive and frequent menstruation with regular cycle: Secondary | ICD-10-CM | POA: Insufficient documentation

## 2015-06-01 MED ORDER — IBUPROFEN 600 MG PO TABS: 600.0000 mg | ORAL_TABLET | Freq: Four times a day (QID) | ORAL | Status: DC | PRN

## 2015-06-01 NOTE — Progress Notes (Signed)
Patient ID: Stacey [name], female   DOB: [birth year], 24 y.o.   MRN: [id number]  Chief Complaint  Patient presents with  . Menorrhagia    started period yesterday; heavy bleeding with large clots all day today    HPI Stacey SpatesDanasia J [name] is a 24 y.o. female.  [id number] Patient's last menstrual period was 05/31/2015. Heavy menses this am with clot, cramping which is subsiding now. Regular menses on OCP, flow for 4-5 days. Recently treated for STD with neg TOC HPI  Past Medical History  Diagnosis Date  . Migraine   . 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 GERD (gastroesophageal reflux disease)     Past Surgical History  Procedure Laterality Date  . No past surgeries      Family History  Problem Relation Age of Onset  . Anesthesia problems Neg Hx   . Hypertension Mother   . Asthma Brother     Social History Social History  Substance Use Topics  . Smoking status: Current Every Day Smoker -- 0.25 packs/day    Types: Cigars  . Smokeless tobacco: Never Used  . Alcohol Use: Yes     Comment: occasionally    No Known Allergies  Current Outpatient Prescriptions  Medication Sig Dispense Refill  . norethindrone-ethinyl estradiol 1/35 (ORTHO-NOVUM, NORTREL,CYCLAFEM) tablet Take 1 tablet by mouth daily.    Marland Kitchen. ibuprofen (ADVIL,MOTRIN) 600 MG tablet Take 1 tablet (600 mg total) by mouth every 6 (six) hours as needed. 30 tablet 1   No current facility-administered medications for this visit.    Review of Systems Review of Systems  Constitutional: Negative.   Gastrointestinal: Negative for nausea.  Genitourinary: Positive for vaginal bleeding, menstrual problem and pelvic pain (cramps). Negative for dysuria.    Blood pressure 126/74, pulse 86, temperature 98.5 F (36.9 C), temperature source Oral, height 5' (1.524 m), weight 154 lb (69.854 kg), last menstrual period 05/31/2015.  Physical Exam Physical Exam  Constitutional: She appears well-developed. No distress.   Genitourinary:  Pelvic deferred  Skin: Skin is warm and dry.  Psychiatric: She has a normal mood and affect.    Data Reviewed Labs, office notes  Assessment    Menorrhagia and cramps now improving     Plan    Ibuprofen 600 mg po now and Rx sent. Continue present OCP as Rx, report if problem recurs. Reassurance given        ARNOLD,JAMES 06/01/2015, 2:22 PM

## 2015-06-01 NOTE — Patient Instructions (Signed)

## 2015-06-20 ENCOUNTER — Telehealth: Payer: Self-pay

## 2015-06-20 NOTE — Telephone Encounter (Signed)
Spoke with patient I have advised her to wait a couple more days. She is not going through more than one pad per hour. Pt also stated she has some cramping. I advised her to take Motrin as need and to cal us back if symptoms get worse.

## 2015-06-20 NOTE — Telephone Encounter (Signed)
Pt left message stating she was having some abnormal bleeding and passing blood clots. Left message for her to call us back in regards to bleeding

## 2015-06-28 ENCOUNTER — Telehealth: Payer: Self-pay | Admitting: *Deleted

## 2015-06-28 NOTE — Telephone Encounter (Addendum)
Pt left message stating that she is having trouble with her period and lower stomach. She requested call back today after 2:30pm.   Call returned to pt @ 1607. She reports continued irregular bleeding (on and off) since her last clinic visit on 06/01/15 and is changing a pad every 2-3 hours. She endorses taking her OCP's regularly and without interruption. She has generalized cramping as she previously discussed with Dr. Debroah LoopArnold and is also having a sharp pain in her pelvic region which started yesterday. Pt wants to know if she needs to go to ED for evaluation. I advised pt that I will have scheduling staff call her with appt to see Dr. Debroah LoopArnold for her irregular bleeding within the next 2 weeks. She should keep a journal/notes of the days that she has bleeding. While this is abnormal and annoying, it is not an emergency. She may take ibuprofen and tylenol for the cramping. If she has severe and debilitating pain, she should go to [facility] for evaluation. Pt voiced understanding.

## 2015-07-12 ENCOUNTER — Emergency Department (INDEPENDENT_AMBULATORY_CARE_PROVIDER_SITE_OTHER)
Admission: EM | Admit: 2015-07-12 | Discharge: 2015-07-12 | Disposition: A | Discharge status: Home / Self Care | Payer: Medicaid Other | Source: Home / Self Care | Attending: Emergency Medicine | Admitting: Emergency Medicine

## 2015-07-12 ENCOUNTER — Encounter (HOSPITAL_COMMUNITY): Payer: Self-pay | Admitting: Emergency Medicine

## 2015-07-12 DIAGNOSIS — M779 Enthesopathy, unspecified: Secondary | ICD-10-CM

## 2015-07-12 DIAGNOSIS — L0291 Cutaneous abscess, unspecified: Secondary | ICD-10-CM | POA: Diagnosis not present

## 2015-07-12 MED ORDER — CLINDAMYCIN HCL 150 MG PO CAPS: 150.0000 mg | ORAL_CAPSULE | Freq: Three times a day (TID) | ORAL | Status: DC

## 2015-07-12 MED ORDER — NAPROXEN 500 MG PO TABS: 500.0000 mg | ORAL_TABLET | Freq: Two times a day (BID) | ORAL | Status: DC

## 2015-07-12 NOTE — Discharge Instructions (Signed)
Abscess An abscess (boil or furuncle) is an infected area on or under the skin. This area is filled with yellowish-white fluid (pus) and other material (debris). HOME CARE   Only take medicines as told by your doctor.  If you were given antibiotic medicine, take it as directed. Finish the medicine even if you start to feel better.  If gauze is used, follow your doctor's directions for changing the gauze.  To avoid spreading the infection:  Keep your abscess covered with a bandage.  Wash your hands well.  Do not share personal care items, towels, or whirlpools with others.  Avoid skin contact with others.  Keep your skin and clothes clean around the abscess.  Keep all doctor visits as told. GET HELP RIGHT AWAY IF:   You have more pain, puffiness (swelling), or redness in the wound site.  You have more fluid or blood coming from the wound site.  You have muscle aches, chills, or you feel sick.  You have a fever. MAKE SURE YOU:   Understand these instructions.  Will watch your condition.  Will get help right away if you are not doing well or get worse.   This information is not intended to replace advice given to you by your health care provider. Make sure you discuss any questions you have with your health care provider.   Document Released: 11/21/2007 Document Revised: 12/04/2011 Document Reviewed: 08/18/2011 Elsevier Interactive Patient Education 2016 Else[employer] Lollie Sails Disease Lollie Sails disease is inflammation of the tendon on the thumb side of the wrist. Tendons are cords of tissue that connect bones to muscles. The tendons in your hand pass through a tunnel, or sheath. A slippery layer of tissue (synovium) lets the tendons move smoothly in the sheath. With de Quervain disease, the sheath swells or thickens, causing friction and pain. The condition is also called de Quervain tendinosis and de Quervain syndrome. It occurs most often in women who are 26-36  years old. CAUSES  The exact cause of de Quervain disease is not known. It may result from:   Overusing your hands, especially with repetitive motions that involve twisting your hand or using a forceful grip.  Pregnancy.  Rheumatoid disease. RISK FACTORS You may have a greater risk for de Quervain disease if you:  Are a middle-aged woman.  Are pregnant.  Have rheumatoid arthritis.  Have diabetes.  Use your hands far more than normal, especially with a tight grip or excessive twisting. SIGNS AND SYMPTOMS Pain on the thumb side of your wrist is the main symptom of de Quervain disease. Other signs and symptoms include:  Pain that gets worse when you grasp something or turn your wrist.  Pain that extends up the forearm.  Cysts in the area of the pain.  Swelling of your wrist and hand.  A sensation of snapping in the wrist.  Trouble moving the thumb and wrist. DIAGNOSIS  Your health care provider may diagnose de Quervain disease based on your signs and symptoms. A physical exam will also be done. A simple test Lourena Simmonds test) that involves pulling your thumb and wrist to see if this causes pain can help determine whether you have the condition. Sometimes you may need to have an X-ray.  TREATMENT  Avoiding any activity that causes pain and swelling is the best treatment. Other options include:  Wearing a splint.  Taking medicine. Anti-inflammatory medicines and corticosteroid injections may reduce inflammation and relieve pain.  Having surgery if other treatments do  not work. HOME CARE INSTRUCTIONS   Using ice can be helpful after doing activities that involve the sore wrist. To apply ice to the injured area:  Put ice in a plastic bag.  Place a towel between your skin and the bag.  Leave the ice on for 20 minutes, 2-3 times a day.  Take medicines only as directed by your health care provider.  Wear your splint as directed. This will allow your hand to rest and  heal. SEEK MEDICAL CARE IF:   Your pain medicine does not help.   Your pain gets worse.  You develop new symptoms. MAKE SURE YOU:   Understand these instructions.  Will watch your condition.  Will get help right away if you are not doing well or get worse.   This information is not intended to replace advice given to you by your health care provider. Make sure you discuss any questions you have with your health care provider.   Document Released: 02/27/2001 Document Revised: 06/25/2014 Document Reviewed: 10/07/2013 Elsevier Interactive Patient Education [employer]

## 2015-07-12 NOTE — ED Notes (Signed)
C/o right hand pain and swelling onset yest... 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 No acute distress.

## 2015-07-14 NOTE — ED Provider Notes (Signed)
CSN: [id number]     Arrival date & time 07/12/15  1652 History   First MD Initiated Contact with Patient 07/12/15 1823     Chief Complaint  Patient presents with  . Hand Pain  . Abscess   (Consider location/radiation/quality/duration/timing/severity/associated sxs/prior Treatment) HPI Patient presents with 2 complaints abscess on her right breast and pain in her right wrist. Patient states symptoms are been present now for several days. She has been using warm compresses to her right breast that she actually thinks this is getting somewhat better. She states that her wrist she is having pain with lifting and movement. Past Medical History  Diagnosis Date  . Migraine   . 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 GERD (gastroesophageal reflux disease)    Past Surgical History  Procedure Laterality Date  . No past surgeries     Family History  Problem Relation Age of Onset  . Anesthesia problems Neg Hx   . Hypertension Mother   . Asthma Brother    Social History  Substance Use Topics  . Smoking status: Current Every Day Smoker -- 0.25 packs/day    Types: Cigars  . Smokeless tobacco: Never Used  . Alcohol Use: Yes     Comment: occasionally   OB History    Gravida Para Term Preterm AB TAB SAB Ectopic Multiple Living   0 2 2 0 0 0 1     Review of Systems Positive for right breast infection and right wrist pain. Negative for fever nausea vomiting numbness tingling Allergies  Review of patient's allergies indicates no known allergies.  Home Medications   Prior to Admission medications   Medication Sig Start Date End Date Taking? Authorizing Provider  norethindrone-ethinyl estradiol 1/35 (ORTHO-NOVUM, NORTREL,CYCLAFEM) tablet Take 1 tablet by mouth daily.   Yes Historical Provider, MD  clindamycin (CLEOCIN) 150 MG capsule Take 1 capsule (150 mg total) by mouth 3 (three) times daily. 07/12/15   Tharon Aquas, PA  ibuprofen (ADVIL,MOTRIN) 600 MG tablet Take 1 tablet  (600 mg total) by mouth every 6 (six) hours as needed. 06/01/15   Adam Phenix, MD  naproxen (NAPROSYN) 500 MG tablet Take 1 tablet (500 mg total) by mouth 2 (two) times daily. 07/12/15   Tharon Aquas, PA   Meds Ordered and Administered this Visit  Medications - No data to display  BP 111/79 mmHg  Pulse 83  Temp(Src) 98.3 F (36.8 C) (Oral)  Resp 16  SpO2 100%  LMP 07/01/2015 No data found.   Physical Exam  Constitutional: She is oriented to person, place, and time. She appears well-developed and well-nourished.  Pulmonary/Chest: Effort normal. She exhibits no tenderness.    Musculoskeletal: She exhibits tenderness.       Left wrist: She exhibits tenderness and crepitus.       Arms: Neurological: She is alert and oriented to person, place, and time.  Skin: Skin is warm and dry.  Nursing note and vitals reviewed.   ED Course  Procedures (including critical care time)  Labs Review Labs Reviewed - No data to display  Imaging Review No results found.   Visual Acuity Review  Right Eye Distance:   Left Eye Distance:   Bilateral Distance:    Right Eye Near:   Left Eye Near:    Bilateral Near:        Splint was applied to right wrist by nursing staff. MDM   1. Tendonitis  2. Abscess     Patient is advised to continue home symptomatic treatment. Prescription for clindamycin and Naprosyn  sent pharmacy patient has indicated. Patient is advised that if there are new or worsening symptoms or attend the emergency department, or contact primary care provider. Instructions of care provided discharged home in stable condition.  THIS NOTE WAS GENERATED USING A VOICE RECOGNITION SOFTWARE PROGRAM. ALL REASONABLE EFFORTS  WERE MADE TO PROOFREAD THIS DOCUMENT FOR ACCURACY.     Tharon Aquas, PA 07/14/15 1742

## 2015-07-18 ENCOUNTER — Ambulatory Visit (INDEPENDENT_AMBULATORY_CARE_PROVIDER_SITE_OTHER): Payer: Medicaid Other | Admitting: Obstetrics & Gynecology

## 2015-07-18 ENCOUNTER — Other Ambulatory Visit (HOSPITAL_COMMUNITY)
Admission: RE | Admit: 2015-07-18 | Discharge: 2015-07-18 | Disposition: A | Discharge status: Home / Self Care | Payer: Medicaid Other | Source: Ambulatory Visit | Attending: Obstetrics & Gynecology | Admitting: Obstetrics & Gynecology

## 2015-07-18 ENCOUNTER — Encounter: Payer: Self-pay | Admitting: Obstetrics & Gynecology

## 2015-07-18 VITALS — BP 117/74 | HR 94 | Temp 98.6°F | Ht 60.0 in | Wt 152.2 lb

## 2015-07-18 DIAGNOSIS — N938 Other specified abnormal uterine and vaginal bleeding: Secondary | ICD-10-CM | POA: Diagnosis not present

## 2015-07-18 DIAGNOSIS — N946 Dysmenorrhea, unspecified: Secondary | ICD-10-CM

## 2015-07-18 DIAGNOSIS — A64 Unspecified sexually transmitted disease: Secondary | ICD-10-CM

## 2015-07-18 DIAGNOSIS — Z113 Encounter for screening for infections with a predominantly sexual mode of transmission: Secondary | ICD-10-CM

## 2015-07-18 MED ORDER — NORETHINDRONE-MESTRANOL 1-50 MG-MCG PO TABS: 1.0000 | ORAL_TABLET | Freq: Every day | ORAL | Status: DC

## 2015-07-18 NOTE — Progress Notes (Signed)
Patient ID: Stacey [name], female   DOB: 07-Jul-1990, 25 y.o.   MRN: [id number]  Chief Complaint  Patient presents with  . Menorrhagia  irregular bleeding and pain since her last visit still on OCP  HPI Stacey [name] is a 25 y.o. female.  Patient's last menstrual period was 06/18/2015 (exact date). [id number] Still has irregular bleeding and pain despite compliance with her OCP.   HPI  Past Medical History  Diagnosis Date  . Migraine   . 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 GERD (gastroesophageal reflux disease)     Past Surgical History  Procedure Laterality Date  . No past surgeries      Family History  Problem Relation Age of Onset  . Anesthesia problems Neg Hx   . Hypertension Mother   . Asthma Brother     Social History Social History  Substance Use Topics  . Smoking status: Current Every Day Smoker -- 0.25 packs/day    Types: Cigars  . Smokeless tobacco: Never Used  . Alcohol Use: Yes     Comment: occasionally    No Known Allergies  Current Outpatient Prescriptions  Medication Sig Dispense Refill  . clindamycin (CLEOCIN) 150 MG capsule Take 1 capsule (150 mg total) by mouth 3 (three) times daily. 15 capsule 0  . naproxen (NAPROSYN) 500 MG tablet Take 1 tablet (500 mg total) by mouth 2 (two) times daily. 30 tablet 0  . ibuprofen (ADVIL,MOTRIN) 600 MG tablet Take 1 tablet (600 mg total) by mouth every 6 (six) hours as needed. (Patient not taking: Reported on 07/18/2015) 30 tablet 1  . Norethindrone-Mestranol (NECON) 1-50 MG-MCG tablet Take 1 tablet by mouth daily. 1 Package 11   No current facility-administered medications for this visit.    Review of Systems Review of Systems  Constitutional: Negative.   Genitourinary: Positive for menstrual problem. Negative for vaginal bleeding and vaginal discharge.    Blood pressure 117/74, pulse 94, temperature 98.6 F (37 C), temperature source Oral, height 5' (1.524 m), weight 152 lb 3.2 oz (69.037 kg), last  menstrual period 06/18/2015.  Physical Exam Physical Exam  Constitutional: She is oriented to person, place, and time. She appears well-developed. No distress.  Genitourinary: Vagina normal and uterus normal. No vaginal discharge found.  Mild right adnexal tenderness no mass  Neurological: She is alert and oriented to person, place, and time.  Skin: Skin is warm and dry.  Psychiatric: She has a normal mood and affect. Her behavior is normal.    Data Reviewed Lab result, previous positive STd  Assessment    DUB on OCP, pain     Plan    STD testing and wet prep Increase to ON 1/50 continuous and RTC 1 mo        Stacey [name] 07/18/2015, 3:38 PM

## 2015-07-18 NOTE — Patient Instructions (Signed)

## 2015-07-19 ENCOUNTER — Telehealth: Payer: Self-pay | Admitting: General Practice

## 2015-07-19 DIAGNOSIS — B9689 Other specified bacterial agents as the cause of diseases classified elsewhere: Secondary | ICD-10-CM

## 2015-07-19 DIAGNOSIS — T3695XA Adverse effect of unspecified systemic antibiotic, initial encounter: Secondary | ICD-10-CM

## 2015-07-19 DIAGNOSIS — B379 Candidiasis, unspecified: Secondary | ICD-10-CM

## 2015-07-19 DIAGNOSIS — N760 Acute vaginitis: Principal | ICD-10-CM

## 2015-07-19 LAB — GC/CHLAMYDIA PROBE AMP (~~LOC~~) NOT AT ARMC
CHLAMYDIA, DNA PROBE: NEGATIVE
Neisseria Gonorrhea: NEGATIVE

## 2015-07-19 LAB — WET PREP, GENITAL
Trich, Wet Prep: NONE SEEN
Yeast Wet Prep HPF POC: NONE SEEN

## 2015-07-19 MED ORDER — FLUCONAZOLE 150 MG PO TABS: 150.0000 mg | ORAL_TABLET | Freq: Once | ORAL | Status: DC

## 2015-07-19 MED ORDER — METRONIDAZOLE 500 MG PO TABS: 500.0000 mg | ORAL_TABLET | Freq: Two times a day (BID) | ORAL | Status: DC

## 2015-07-19 NOTE — Telephone Encounter (Signed)
Per Dr Debroah Loop, patient's wet prep shows BV and needs flagyl sent to pharmacy. Called patient & informed her of results and medication at pharmacy. Patient verbalized understanding & is aware to avoid alcohol while on flagyl. Patient asked for Rx for yeast infection as antibiotics usually cause that. Told patient we can send that in as well. Patient verbalized understanding & had no other questions

## 2015-07-25 ENCOUNTER — Ambulatory Visit (HOSPITAL_COMMUNITY)
Admission: RE | Admit: 2015-07-25 | Discharge: 2015-07-25 | Disposition: A | Discharge status: Home / Self Care | Payer: Medicaid Other | Source: Ambulatory Visit | Attending: Obstetrics & Gynecology | Admitting: Obstetrics & Gynecology

## 2015-07-25 ENCOUNTER — Other Ambulatory Visit (HOSPITAL_COMMUNITY): Payer: Self-pay | Admitting: Obstetrics and Gynecology

## 2015-07-25 DIAGNOSIS — A64 Unspecified sexually transmitted disease: Secondary | ICD-10-CM

## 2015-07-25 DIAGNOSIS — N938 Other specified abnormal uterine and vaginal bleeding: Secondary | ICD-10-CM

## 2015-07-25 DIAGNOSIS — N946 Dysmenorrhea, unspecified: Secondary | ICD-10-CM | POA: Diagnosis not present

## 2015-08-15 ENCOUNTER — Ambulatory Visit: Payer: Self-pay | Admitting: Obstetrics & Gynecology

## 2015-08-26 ENCOUNTER — Ambulatory Visit (INDEPENDENT_AMBULATORY_CARE_PROVIDER_SITE_OTHER): Payer: Medicaid Other | Admitting: Obstetrics & Gynecology

## 2015-08-26 ENCOUNTER — Encounter: Payer: Self-pay | Admitting: Obstetrics & Gynecology

## 2015-08-26 VITALS — BP 107/65 | HR 89 | Temp 98.4°F | Ht 60.0 in | Wt 144.9 lb

## 2015-08-26 DIAGNOSIS — N920 Excessive and frequent menstruation with regular cycle: Secondary | ICD-10-CM | POA: Diagnosis present

## 2015-08-26 NOTE — Progress Notes (Signed)
Subjective, cc: doing well on her OCP      Patient ID: Stacey [name], female   DOB: 03-Sep-1990, 25 y.o.   MRN: [id number]  [id number] Patient's last menstrual period was 08/17/2015 (exact date). Current Outpatient Prescriptions on File Prior to Visit  Medication Sig Dispense Refill  . Norethindrone-Mestranol (NECON) 1-50 MG-MCG tablet Take 1 tablet by mouth daily. 1 Package 11  . clindamycin (CLEOCIN) 150 MG capsule Take 1 capsule (150 mg total) by mouth 3 (three) times daily. (Patient not taking: Reported on 08/26/2015) 15 capsule 0  . fluconazole (DIFLUCAN) 150 MG tablet Take 1 tablet (150 mg total) by mouth once. (Patient not taking: Reported on 08/26/2015) 1 tablet 0  . ibuprofen (ADVIL,MOTRIN) 600 MG tablet Take 1 tablet (600 mg total) by mouth every 6 (six) hours as needed. (Patient not taking: Reported on 07/18/2015) 30 tablet 1  . metroNIDAZOLE (FLAGYL) 500 MG tablet Take 1 tablet (500 mg total) by mouth 2 (two) times daily. (Patient not taking: Reported on 08/26/2015) 14 tablet 0  . naproxen (NAPROSYN) 500 MG tablet Take 1 tablet (500 mg total) by mouth 2 (two) times daily. (Patient not taking: Reported on 08/26/2015) 30 tablet 0   No current facility-administered medications on file prior to visit.      Review of Systems  Constitutional: Negative.   Genitourinary: Negative for vaginal bleeding, vaginal discharge and menstrual problem.       Objective:   Physical Exam  Constitutional: She is oriented to person, place, and time. She appears well-developed. No distress.  Pulmonary/Chest: Effort normal.  Neurological: She is alert and oriented to person, place, and time.  Psychiatric: She has a normal mood and affect. Her behavior is normal.   Blood pressure 107/65, pulse 89, temperature 98.4 F (36.9 C), temperature source Oral, height 5' (1.524 m), weight 144 lb 14.4 oz (65.726 kg), last menstrual period 08/17/2015.     Assessment:     Doing well on her OCP      Plan:      Continue as prescribed   Adam PhenixJames G Darcell Yacoub, MD 08/26/2015

## 2015-08-26 NOTE — Patient Instructions (Signed)
Oral Contraception Use Oral contraceptive pills (OCPs) are medicines taken to prevent pregnancy. OCPs work by preventing the ovaries from releasing eggs. The hormones in OCPs also cause the cervical mucus to thicken, preventing the sperm from entering the uterus. The hormones also cause the uterine lining to become thin, not allowing a fertilized egg to attach to the inside of the uterus. OCPs are highly effective when taken exactly as prescribed. However, OCPs do not prevent sexually transmitted diseases (STDs). Safe sex practices, such as using condoms along with an OCP, can help prevent STDs. Before taking OCPs, you may have a physical exam and Pap test. Your health care provider may also order blood tests if necessary. Your health care provider will make sure you are a good candidate for oral contraception. Discuss with your health care provider the possible side effects of the OCP you may be prescribed. When starting an OCP, it can take 2 to 3 months for the body to adjust to the changes in hormone levels in your body.  HOW TO TAKE ORAL CONTRACEPTIVE PILLS Your health care provider may advise you on how to start taking the first cycle of OCPs. Otherwise, you can:   Start on day 1 of your menstrual period. You will not need any backup contraceptive protection with this start time.   Start on the first Sunday after your menstrual period or the day you get your prescription. In these cases, you will need to use backup contraceptive protection for the first week.   Start the pill at any time of your cycle. If you take the pill within 5 days of the start of your period, you are protected against pregnancy right away. In this case, you will not need a backup form of birth control. If you start at any other time of your menstrual cycle, you will need to use another form of birth control for 7 days. If your OCP is the type called a minipill, it will protect you from pregnancy after taking it for 2 days (48  hours). After you have started taking OCPs:   If you forget to take 1 pill, take it as soon as you remember. Take the next pill at the regular time.   If you miss 2 or more pills, call your health care provider because different pills have different instructions for missed doses. Use backup birth control until your next menstrual period starts.   If you use a 28-day pack that contains inactive pills and you miss 1 of the last 7 pills (pills with no hormones), it will not matter. Throw away the rest of the non-hormone pills and start a new pill pack.  No matter which day you start the OCP, you will always start a new pack on that same day of the week. Have an extra pack of OCPs and a backup contraceptive method available in case you miss some pills or lose your OCP pack.  HOME CARE INSTRUCTIONS   Do not smoke.   Always use a condom to protect against STDs. OCPs do not protect against STDs.   Use a calendar to mark your menstrual period days.   Read the information and directions that came with your OCP. Talk to your health care provider if you have questions.  SEEK MEDICAL CARE IF:   You develop nausea and vomiting.   You have abnormal vaginal discharge or bleeding.   You develop a rash.   You miss your menstrual period.   You are losing   your hair.   You need treatment for mood swings or depression.   You get dizzy when taking the OCP.   You develop acne from taking the OCP.   You become pregnant.  SEEK IMMEDIATE MEDICAL CARE IF:   You develop chest pain.   You develop shortness of breath.   You have an uncontrolled or severe headache.   You develop numbness or slurred speech.   You develop visual problems.   You develop pain, redness, and swelling in the legs.    This information is not intended to replace advice given to you by your health care provider. Make sure you discuss any questions you have with your health care provider.   Document  Released: 05/24/2011 Document Revised: 06/25/2014 Document Reviewed: 11/23/2012 Elsevier Interactive Patient Education 2016 [employer]  

## 2015-10-30 IMAGING — US US OB COMP LESS 14 WK
1 series · 13 of 28 positions shown · non-contrast
Comparison: CT of the abdomen and pelvis performed 05/05/2013, and
pelvic ultrasound performed 04/30/2013

CLINICAL DATA: Lower abdominal and back pain.

EXAM:
OBSTETRIC <14 WK US AND TRANSVAGINAL OB US
TECHNIQUE: Both transabdominal and transvaginal ultrasound examinations were
performed for complete evaluation of the gestation as well as the
maternal uterus, adnexal regions, and pelvic cul-de-sac.
Transvaginal technique was performed to assess early pregnancy.

[Series 1: us ob comp less 14 wks · 37 acquisitions, 13 frames shown]
[im 2/37]
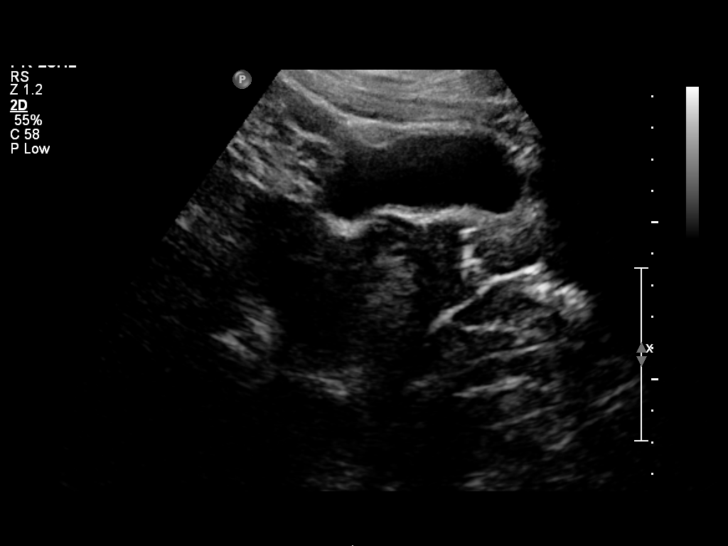
[im 5/37]
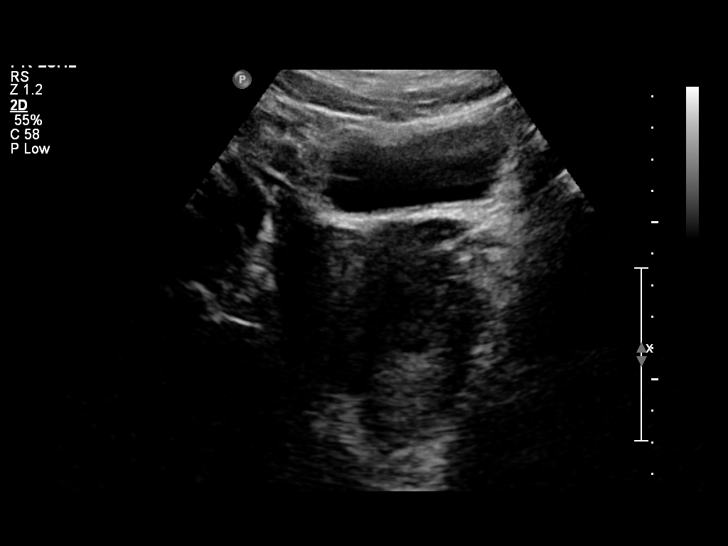
[im 7/37]
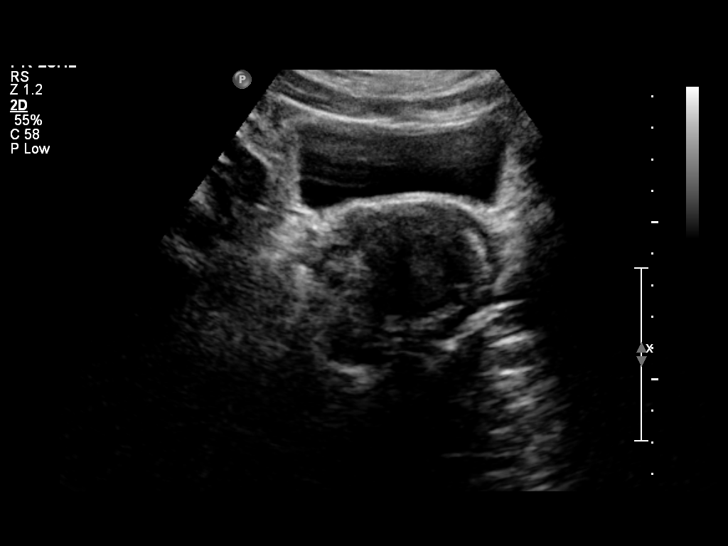
[im 10/37]
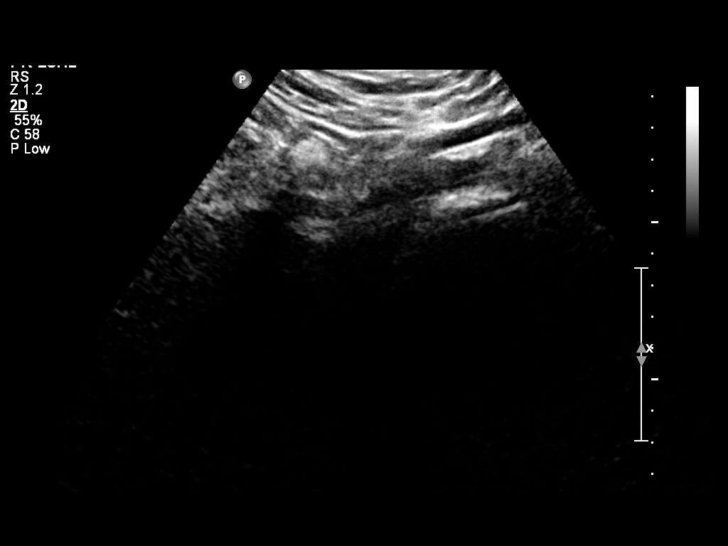
[im 13/37]
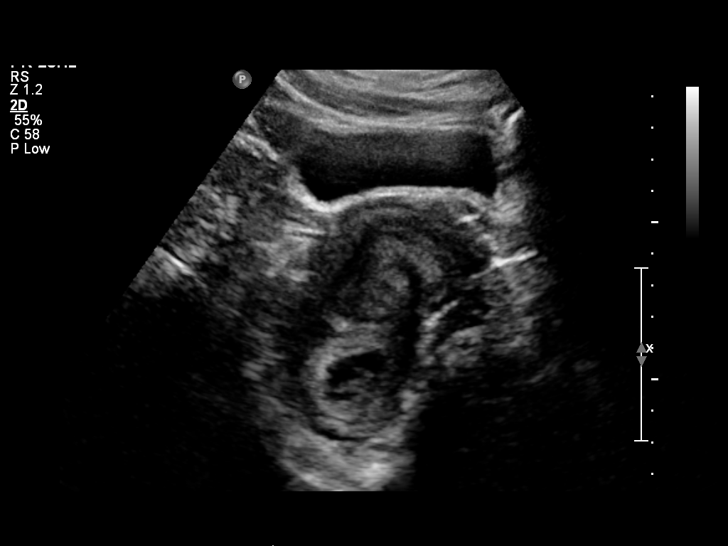
[im 15/37]
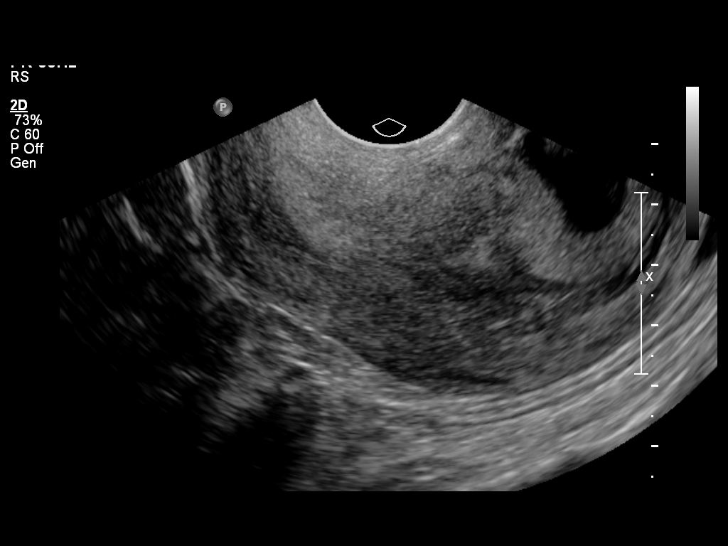
[im 19/37]
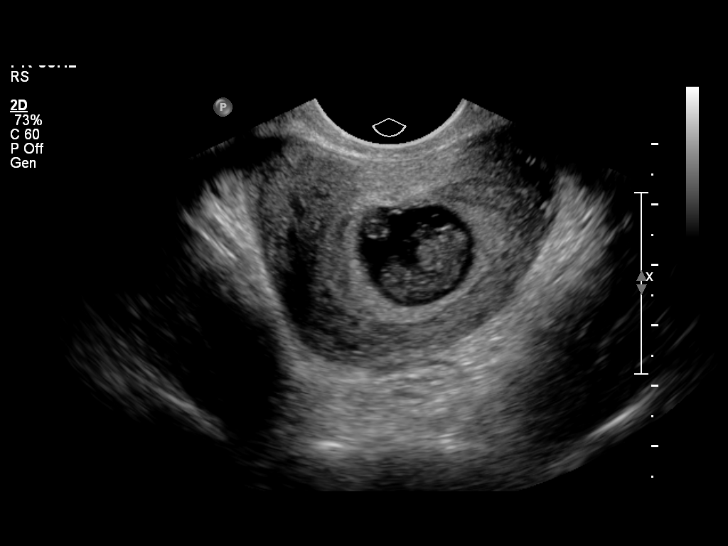
[im 22/37]
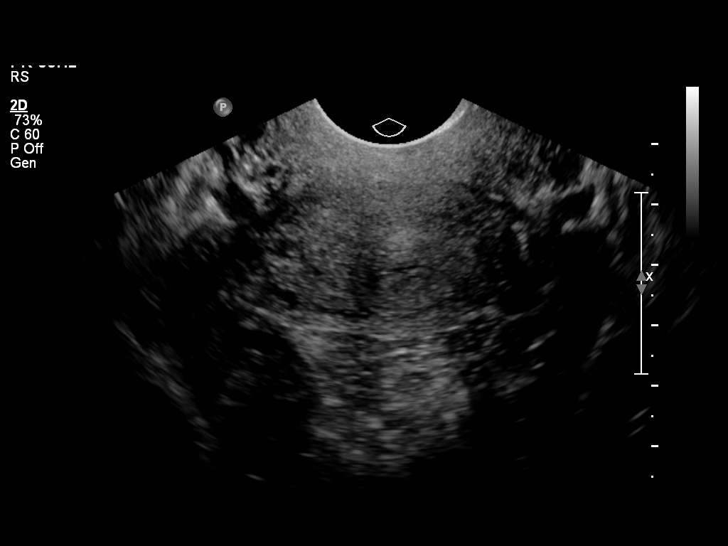
[im 25/37]
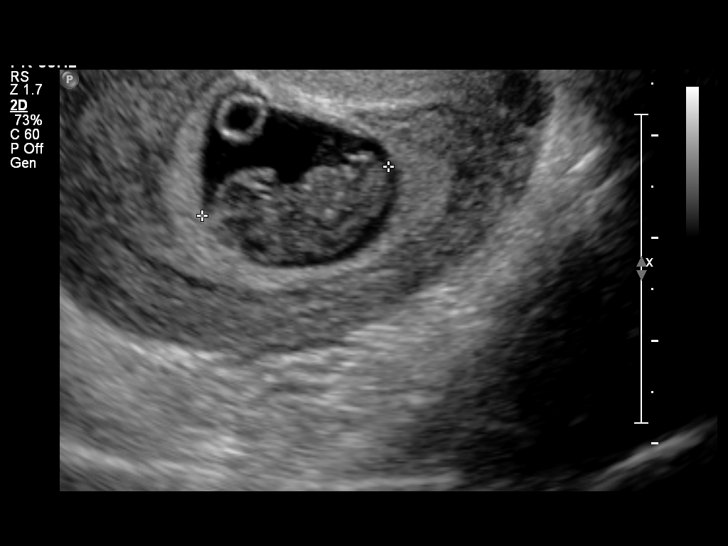
[im 27/37]
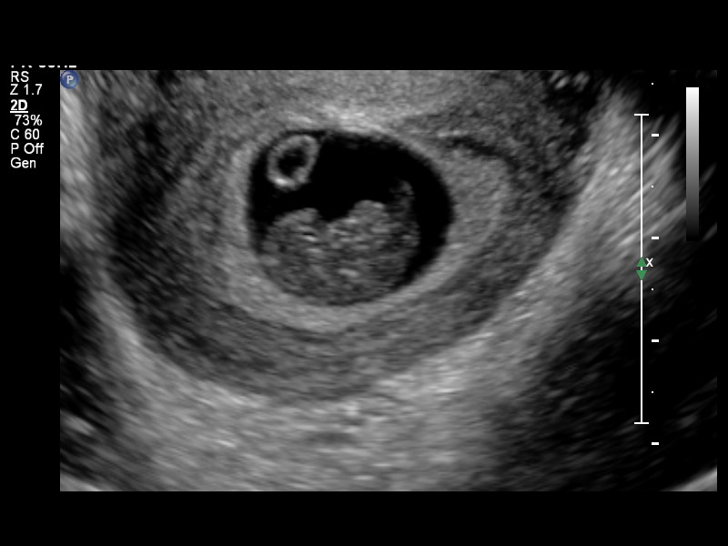
[im 30/37]
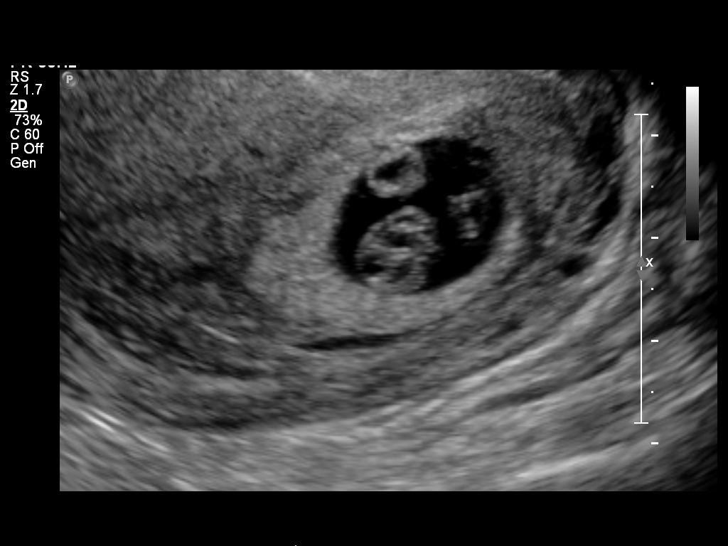
[im 33/37]
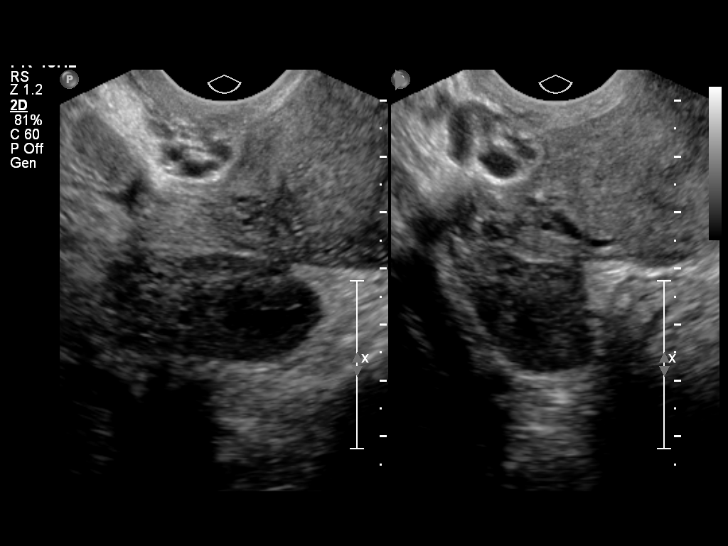
[im 35/37]
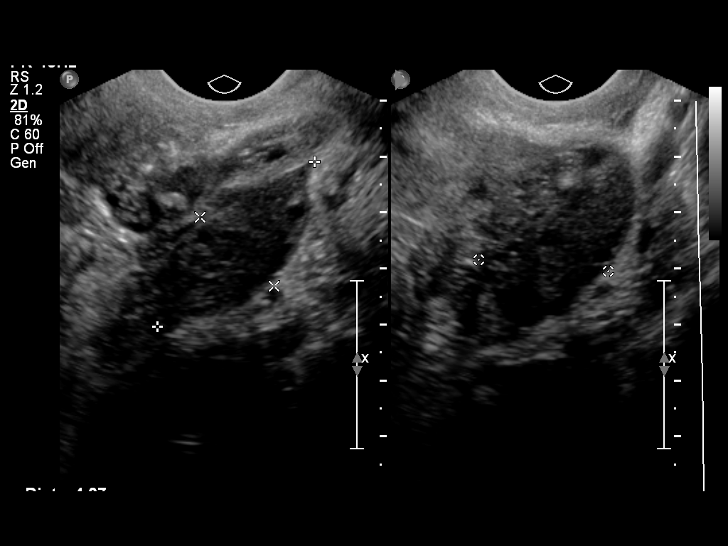

[13 of 28 positions shown; findings below may reference images not displayed]

FINDINGS: Intrauterine gestational sac: Visualized/normal in shape.

Yolk sac:  Yes

Embryo:  Yes

Cardiac Activity: Yes

Heart Rate: 158 bpm

CRL:   19.2  mm   8 w 4 d                  US EDC: 05/21/2014

Maternal uterus/adnexae: No subchorionic hemorrhage is noted. The
uterus is otherwise unremarkable in appearance.

The right ovary measures 3.8 x 2.0 x 2.1 cm, while the left ovary
measures 4.1 x 1.8 x 2.3 cm. No suspicious adnexal masses are seen;
there is no evidence for ovarian torsion.

No free fluid is seen within the pelvic cul-de-sac.
IMPRESSION: Single live intrauterine pregnancy noted, with a crown-rump length
of 1.9 cm, corresponding to a gestational age of 8 weeks 4 days.
This does not match the gestational age by LMP, and reflects a new
estimated date of delivery [REDACTED], 3666.

## 2015-12-26 ENCOUNTER — Encounter: Payer: Self-pay | Admitting: Obstetrics & Gynecology

## 2015-12-26 ENCOUNTER — Ambulatory Visit (INDEPENDENT_AMBULATORY_CARE_PROVIDER_SITE_OTHER): Payer: Medicaid Other | Admitting: Obstetrics & Gynecology

## 2015-12-26 VITALS — BP 111/69 | HR 76 | Wt 147.9 lb

## 2015-12-26 DIAGNOSIS — N898 Other specified noninflammatory disorders of vagina: Secondary | ICD-10-CM

## 2015-12-26 DIAGNOSIS — R399 Unspecified symptoms and signs involving the genitourinary system: Secondary | ICD-10-CM

## 2015-12-26 LAB — POCT URINALYSIS DIP (DEVICE)
GLUCOSE, UA: NEGATIVE mg/dL
KETONES UR: NEGATIVE mg/dL
Leukocytes, UA: NEGATIVE
Nitrite: NEGATIVE
PROTEIN: NEGATIVE mg/dL
Specific Gravity, Urine: >=1.03 (ref 1.005–1.030)
Urobilinogen, UA: 0.2 mg/dL (ref 0.0–1.0)
pH: 5.5 (ref 5.0–8.0)

## 2015-12-26 NOTE — Patient Instructions (Signed)

## 2015-12-26 NOTE — Progress Notes (Signed)
Patient ID: Stacey SpatesDanasia J [name], female   DOB: August 05, 1990, 25 y.o.   MRN: [id number]  Chief Complaint  Patient presents with  . Vaginal Discharge  for more than 3 weeks  HPI Stacey [name] is a 25 y.o. female.  [id number] Patient's last menstrual period was 11/25/2013. Regular menses now on OCP, notes white vaginal discharge for at least 3 weeks, slight odor and itch, some sensation of not fully emptying her bladder  HPI  Past Medical History  Diagnosis Date  . Migraine   . 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 GERD (gastroesophageal reflux disease)     Past Surgical History  Procedure Laterality Date  . No past surgeries      Family History  Problem Relation Age of Onset  . Anesthesia problems Neg Hx   . Hypertension Mother   . Asthma Brother     Social History Social History  Substance Use Topics  . Smoking status: Current Every Day Smoker -- 0.25 packs/day    Types: Cigars  . Smokeless tobacco: Never Used  . Alcohol Use: Yes     Comment: occasionally    No Known Allergies  Current Outpatient Prescriptions  Medication Sig Dispense Refill  . Norethindrone-Mestranol (NECON) 1-50 MG-MCG tablet Take 1 tablet by mouth daily. 1 Package 11  . clindamycin (CLEOCIN) 150 MG capsule Take 1 capsule (150 mg total) by mouth 3 (three) times daily. (Patient not taking: Reported on 08/26/2015) 15 capsule 0  . fluconazole (DIFLUCAN) 150 MG tablet Take 1 tablet (150 mg total) by mouth once. (Patient not taking: Reported on 08/26/2015) 1 tablet 0  . ibuprofen (ADVIL,MOTRIN) 600 MG tablet Take 1 tablet (600 mg total) by mouth every 6 (six) hours as needed. (Patient not taking: Reported on 07/18/2015) 30 tablet 1  . metroNIDAZOLE (FLAGYL) 500 MG tablet Take 1 tablet (500 mg total) by mouth 2 (two) times daily. (Patient not taking: Reported on 08/26/2015) 14 tablet 0  . naproxen (NAPROSYN) 500 MG tablet Take 1 tablet (500 mg total) by mouth 2 (two) times daily. (Patient not taking: Reported on  08/26/2015) 30 tablet 0   No current facility-administered medications for this visit.    Review of Systems Review of Systems  Constitutional: Negative.   Gastrointestinal: Negative.   Genitourinary: Positive for vaginal discharge and difficulty urinating (possibly not emptying fully). Negative for dysuria, frequency, vaginal bleeding and vaginal pain.    Blood pressure 111/69, pulse 76, weight 147 lb 14.4 oz (67.087 kg), last menstrual period 11/25/2013.  Physical Exam Physical Exam  Constitutional: She is oriented to person, place, and time. She appears well-developed. No distress.  Cardiovascular: Normal rate.   Pulmonary/Chest: Effort normal.  Genitourinary: Uterus normal.  Slight discharge nl mucosa, cervix normal with minimal tenderness no mass  Neurological: She is alert and oriented to person, place, and time.  Psychiatric: She has a normal mood and affect. Her behavior is normal.    Data Reviewed US from January, previous labs  UA today Assessment    Vaginitis with test pending   Urine culture  Plan    Wet prep sent to review will call with result Continue her present OCP        Chrystian Cupples 12/26/2015, 9:27 AM

## 2015-12-27 LAB — URINE CULTURE: Colony Count: 2000

## 2015-12-27 LAB — WET PREP, GENITAL
Trich, Wet Prep: NONE SEEN
Yeast Wet Prep HPF POC: NONE SEEN

## 2016-01-27 ENCOUNTER — Other Ambulatory Visit: Payer: Self-pay

## 2016-01-27 DIAGNOSIS — N760 Acute vaginitis: Principal | ICD-10-CM

## 2016-01-27 DIAGNOSIS — B379 Candidiasis, unspecified: Secondary | ICD-10-CM

## 2016-01-27 DIAGNOSIS — T3695XA Adverse effect of unspecified systemic antibiotic, initial encounter: Secondary | ICD-10-CM

## 2016-01-27 DIAGNOSIS — B9689 Other specified bacterial agents as the cause of diseases classified elsewhere: Secondary | ICD-10-CM

## 2016-01-27 MED ORDER — FLUCONAZOLE 150 MG PO TABS: 150.0000 mg | ORAL_TABLET | Freq: Once | ORAL | 0 refills | Status: AC

## 2016-01-27 MED ORDER — METRONIDAZOLE 500 MG PO TABS: 500.0000 mg | ORAL_TABLET | Freq: Two times a day (BID) | ORAL | 0 refills | Status: DC

## 2016-01-27 NOTE — Telephone Encounter (Signed)
Patient called in stating she is having itchy white discharge. Patient stated it feels like the same symptoms she had before. I have called in a flagyl and diflucan for patient per protocols since we do not have any appointments to work her in. Patient has been instructed to call our office back if her symptoms do not improved. Pt verbalizes understanding.

## 2016-03-19 ENCOUNTER — Inpatient Hospital Stay (HOSPITAL_COMMUNITY)
Admission: AD | Admit: 2016-03-19 | Discharge: 2016-03-19 | Disposition: A | Discharge status: Home / Self Care | Payer: Medicaid Other | Source: Ambulatory Visit | Attending: Obstetrics and Gynecology | Admitting: Obstetrics and Gynecology

## 2016-03-19 ENCOUNTER — Inpatient Hospital Stay (HOSPITAL_COMMUNITY): Payer: Medicaid Other

## 2016-03-19 ENCOUNTER — Encounter (HOSPITAL_COMMUNITY): Payer: Self-pay | Admitting: *Deleted

## 2016-03-19 DIAGNOSIS — O26899 Other specified pregnancy related conditions, unspecified trimester: Secondary | ICD-10-CM

## 2016-03-19 DIAGNOSIS — O9989 Other specified diseases and conditions complicating pregnancy, childbirth and the puerperium: Secondary | ICD-10-CM

## 2016-03-19 DIAGNOSIS — Z3A01 Less than 8 weeks gestation of pregnancy: Secondary | ICD-10-CM | POA: Diagnosis not present

## 2016-03-19 DIAGNOSIS — N898 Other specified noninflammatory disorders of vagina: Secondary | ICD-10-CM

## 2016-03-19 DIAGNOSIS — O26891 Other specified pregnancy related conditions, first trimester: Secondary | ICD-10-CM | POA: Diagnosis not present

## 2016-03-19 DIAGNOSIS — R109 Unspecified abdominal pain: Secondary | ICD-10-CM

## 2016-03-19 LAB — WET PREP, GENITAL
CLUE CELLS WET PREP: NONE SEEN
SPERM: NONE SEEN
TRICH WET PREP: NONE SEEN
YEAST WET PREP: NONE SEEN

## 2016-03-19 LAB — URINE MICROSCOPIC-ADD ON

## 2016-03-19 LAB — URINALYSIS, ROUTINE W REFLEX MICROSCOPIC
Bilirubin Urine: NEGATIVE
Glucose, UA: NEGATIVE mg/dL
Ketones, ur: NEGATIVE mg/dL
NITRITE: NEGATIVE
PROTEIN: NEGATIVE mg/dL
SPECIFIC GRAVITY, URINE: 1.01 (ref 1.005–1.030)
pH: 6 (ref 5.0–8.0)

## 2016-03-19 LAB — CBC
HEMATOCRIT: 33 % — AB (ref 36.0–46.0)
HEMOGLOBIN: 11.3 g/dL — AB (ref 12.0–15.0)
MCH: 28 pg (ref 26.0–34.0)
MCHC: 34.2 g/dL (ref 30.0–36.0)
MCV: 81.9 fL (ref 78.0–100.0)
Platelets: 351 10*3/uL (ref 150–400)
RBC: 4.03 MIL/uL (ref 3.87–5.11)
RDW: 13.6 % (ref 11.5–15.5)
WBC: 5.5 10*3/uL (ref 4.0–10.5)

## 2016-03-19 LAB — HCG, QUANTITATIVE, PREGNANCY: HCG, BETA CHAIN, QUANT, S: 7192 m[IU]/mL — AB (ref <5)

## 2016-03-19 LAB — POCT PREGNANCY, URINE: PREG TEST UR: POSITIVE — AB

## 2016-03-19 MED ORDER — CLOTRIMAZOLE 1 % EX CREA: TOPICAL_CREAM | CUTANEOUS | 0 refills | Status: DC

## 2016-03-19 NOTE — MAU Provider Note (Signed)
History     CSN: [id number]  Arrival date and time: 03/19/16 4696   First Provider Initiated Contact with Patient 03/19/16 [phone]      Chief Complaint  Patient presents with  . Abdominal Pain  . Vaginal Discharge  . Vaginal Itching   HPI   [name] is a 25 y.o. female [phone] @ [redacted]w[redacted]d  Here with abdominal pain and vaginal discharge.  The pain is located in her Lower abdomen, the pain is worse in the middle of her abdomen. The pain comes and goes. She has not tried anything for the pain.   She is experiencing vaginal irritation and itching on the outside of her vagina only. No itching on the inside of her vagina.   [name] History    Gravida Para Term Preterm AB Living   4 1 1  0 2 1   SAB TAB Ectopic Multiple Live Births   0 2 0 0 1      Past Medical History:  Diagnosis Date  . GERD (gastroesophageal reflux disease)   . 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 Migraine     Past Surgical History:  Procedure Laterality Date  . NO PAST SURGERIES      Family History  Problem Relation Age of Onset  . Hypertension Mother   . Asthma Brother   . Anesthesia problems Neg Hx     Social History  Substance Use Topics  . Smoking status: Current Every Day Smoker    Packs/day: 0.25    Types: Cigars  . Smokeless tobacco: Never Used  . Alcohol use Yes     Comment: occasionally    Allergies: No Known Allergies  No prescriptions prior to admission.   Results for orders placed or performed during the hospital encounter of 03/19/16 (from the past 48 hour(s))  Urinalysis, Routine w reflex microscopic (not at [facility])     Status: Abnormal   Collection Time: 03/19/16  8:43 AM  Result Value Ref Range   Color, Urine YELLOW YELLOW   APPearance CLEAR CLEAR   Specific Gravity, Urine 1.010 1.005 - 1.030   pH 6.0 5.0 - 8.0   Glucose, UA NEGATIVE NEGATIVE mg/dL   Hgb urine dipstick TRACE (A) NEGATIVE   Bilirubin Urine NEGATIVE NEGATIVE   Ketones, ur NEGATIVE NEGATIVE mg/dL    Protein, ur NEGATIVE NEGATIVE mg/dL   Nitrite NEGATIVE NEGATIVE   Leukocytes, UA SMALL (A) NEGATIVE  Urine microscopic-add on     Status: Abnormal   Collection Time: 03/19/16  8:43 AM  Result Value Ref Range   Squamous Epithelial / LPF 6-30 (A) NONE SEEN   WBC, UA 0-5 0 - 5 WBC/hpf   RBC / HPF 0-5 0 - 5 RBC/hpf   Bacteria, UA FEW (A) NONE SEEN   Urine-Other MUCOUS PRESENT   Pregnancy, urine POC     Status: Abnormal   Collection Time: 03/19/16  9:02 AM  Result Value Ref Range   Preg Test, Ur POSITIVE (A) NEGATIVE    Comment:        THE SENSITIVITY OF THIS METHODOLOGY IS >24 mIU/mL   CBC     Status: Abnormal   Collection Time: 03/19/16  9:46 AM  Result Value Ref Range   WBC 5.5 4.0 - 10.5 K/uL   RBC 4.03 3.87 - 5.11 MIL/uL   Hemoglobin 11.3 (L) 12.0 - 15.0 g/dL   HCT 01.0 (L) 27.2 - 53.6 %   MCV 81.9 78.0 - 100.0 fL  MCH 28.0 26.0 - 34.0 pg   MCHC 34.2 30.0 - 36.0 g/dL   RDW 54.013.6 98.111.5 - 19.115.5 %   Platelets 351 150 - 400 K/uL  hCG, quantitative, pregnancy     Status: Abnormal   Collection Time: 03/19/16  9:47 AM  Result Value Ref Range   hCG, Beta Chain, Quant, S 7,192 (H) <5 mIU/mL    Comment:          GEST. AGE      CONC.  (mIU/mL)   <=1 WEEK        5 - 50     2 WEEKS       50 - 500     3 WEEKS       100 - 10,000     4 WEEKS     1,000 - 30,000     5 WEEKS     3,500 - 115,000   6-8 WEEKS     12,000 - 270,000    12 WEEKS     15,000 - 220,000        FEMALE AND NON-PREGNANT FEMALE:     LESS THAN 5 mIU/mL   Wet prep, genital     Status: Abnormal   Collection Time: 03/19/16 10:55 AM  Result Value Ref Range   Yeast Wet Prep HPF POC NONE SEEN NONE SEEN   Trich, Wet Prep NONE SEEN NONE SEEN   Clue Cells Wet Prep HPF POC NONE SEEN NONE SEEN   WBC, Wet Prep HPF POC MODERATE (A) NONE SEEN   Sperm NONE SEEN    Stacey [name] Comp Less 14 Wks  Result Date: 03/19/2016 CLINICAL DATA:  25 year old pregnant female presents with lower abdominal cramping intermittently for 1 week. EDC by  LMP: 11/04/2016, projecting to an expected gestational age of [redacted] weeks 1 day. EXAM: OBSTETRIC <14 WK Stacey AND TRANSVAGINAL [name] Stacey TECHNIQUE: Both transabdominal and transvaginal ultrasound examinations were performed for complete evaluation of the gestation as well as the maternal uterus, adnexal regions, and pelvic cul-de-sac. Transvaginal technique was performed to assess early pregnancy. COMPARISON:  No prior scans from this gestation. FINDINGS: Intrauterine gestational sac: Single intrauterine gestational sac appears normal in shape and position. Yolk sac:  Present. Embryo:  Not visualized. Embryonic Cardiac Activity: Not visualized. MSD: 7.9  mm   5 w   3  d Subchorionic hemorrhage:  None visualized. Maternal uterus/adnexae: Right ovary measures 3.7 x 2.3 x 2.6 cm and contains a corpus luteum. Left ovary measures 2.8 x 1.7 x 3.4 cm. No suspicious ovarian or adnexal masses. No abnormal free fluid in the pelvis. IMPRESSION: 1. Single intrauterine gestational sac with yolk sac at 5 weeks 3 days by mean sac diameter. No embryo detected, which could be due to early gestational age. A follow-up obstetric scan is advised in 11-14 days. 2. No ovarian or adnexal abnormality. Electronically Signed   By: Delbert PhenixJason A Poff M.D.   On: 03/19/2016 10:57   Stacey [name] Transvaginal  Result Date: 03/19/2016 CLINICAL DATA:  25 year old pregnant female presents with lower abdominal cramping intermittently for 1 week. EDC by LMP: 11/04/2016, projecting to an expected gestational age of [redacted] weeks 1 day. EXAM: OBSTETRIC <14 WK Stacey AND TRANSVAGINAL [name] Stacey TECHNIQUE: Both transabdominal and transvaginal ultrasound examinations were performed for complete evaluation of the gestation as well as the maternal uterus, adnexal regions, and pelvic cul-de-sac. Transvaginal technique was performed to assess early pregnancy. COMPARISON:  No prior scans from this gestation. FINDINGS: Intrauterine gestational sac:  Single intrauterine gestational sac appears  normal in shape and position. Yolk sac:  Present. Embryo:  Not visualized. Embryonic Cardiac Activity: Not visualized. MSD: 7.9  mm   5 w   3  d Subchorionic hemorrhage:  None visualized. Maternal uterus/adnexae: Right ovary measures 3.7 x 2.3 x 2.6 cm and contains a corpus luteum. Left ovary measures 2.8 x 1.7 x 3.4 cm. No suspicious ovarian or adnexal masses. No abnormal free fluid in the pelvis. IMPRESSION: 1. Single intrauterine gestational sac with yolk sac at 5 weeks 3 days by mean sac diameter. No embryo detected, which could be due to early gestational age. A follow-up obstetric scan is advised in 11-14 days. 2. No ovarian or adnexal abnormality. Electronically Signed   By: Delbert Phenix M.D.   On: 03/19/2016 10:57    Review of Systems  Constitutional: Negative for chills and fever.  Gastrointestinal: Positive for abdominal pain.   Physical Exam   Blood pressure 111/67, pulse 92, temperature 98.4 F (36.9 C), temperature source Oral, resp. rate 18, height 5' (1.524 m), weight 148 lb 12.8 oz (67.5 kg), last menstrual period 01/29/2016, SpO2 100 %.  Physical Exam  Constitutional: She is oriented to person, place, and time. She appears well-developed and well-nourished. No distress.  HENT:  Head: Normocephalic.  Eyes: Pupils are equal, round, and reactive to light.  GI: Soft. She exhibits no distension. There is no tenderness. There is no rebound.  Genitourinary:  Genitourinary Comments: Vagina - Small amount of white vaginal discharge, no odor  Cervix - No contact bleeding, no active bleeding  Bimanual exam: Cervix closed Uterus non tender, normal size Adnexa non tender, no masses bilaterally GC/Chlam, wet prep done Chaperone present for exam.   Musculoskeletal: Normal range of motion.  Neurological: She is alert and oriented to person, place, and time.  Skin: Skin is warm. She is not diaphoretic.  Psychiatric: Her behavior is normal.    MAU Course  Procedures   None  MDM  [place]  CBC  Hcg  Assessment and Plan   A:  1. Vaginal irritation   2. Abdominal pain in pregnancy, antepartum     P:  Discharge home in stable condition Rx: Clotrimazole for external use. If symptoms worsen the patient is encouraged to buy over the counter yeast treatment for 7 days Start prenatal care Strict return precautions discussed.    Duane Lope, NP 03/19/2016 3:06 PM

## 2016-03-19 NOTE — MAU Note (Signed)
Pt states she is having lower abdominal cramping that started one week ago.  Pt states she rates it at an 8 and last night she had trouble walking.  Pt states she is having vaginal discharge and itching that has been going on for 3-4 days.  Pt states she had a positive home pregnancy test.

## 2016-03-19 NOTE — Discharge Instructions (Signed)

## 2016-03-20 LAB — GC/CHLAMYDIA PROBE AMP (~~LOC~~) NOT AT ARMC
CHLAMYDIA, DNA PROBE: NEGATIVE
Neisseria Gonorrhea: NEGATIVE

## 2016-03-20 LAB — HIV ANTIBODY (ROUTINE TESTING W REFLEX): HIV Screen 4th Generation wRfx: NONREACTIVE

## 2016-03-21 LAB — CULTURE, OB URINE: SPECIAL REQUESTS: NORMAL

## 2016-03-22 ENCOUNTER — Encounter: Payer: Self-pay | Admitting: Student

## 2016-03-22 DIAGNOSIS — R8271 Bacteriuria: Secondary | ICD-10-CM | POA: Insufficient documentation

## 2016-03-24 ENCOUNTER — Encounter (HOSPITAL_COMMUNITY): Payer: Self-pay

## 2016-03-24 ENCOUNTER — Inpatient Hospital Stay (HOSPITAL_COMMUNITY)
Admission: AD | Admit: 2016-03-24 | Discharge: 2016-03-24 | Disposition: A | Discharge status: Home / Self Care | Payer: Medicaid Other | Source: Ambulatory Visit | Attending: Obstetrics and Gynecology | Admitting: Obstetrics and Gynecology

## 2016-03-24 DIAGNOSIS — Z3A01 Less than 8 weeks gestation of pregnancy: Secondary | ICD-10-CM | POA: Insufficient documentation

## 2016-03-24 DIAGNOSIS — F17290 Nicotine dependence, other tobacco product, uncomplicated: Secondary | ICD-10-CM | POA: Insufficient documentation

## 2016-03-24 DIAGNOSIS — E860 Dehydration: Secondary | ICD-10-CM

## 2016-03-24 DIAGNOSIS — O23591 Infection of other part of genital tract in pregnancy, first trimester: Secondary | ICD-10-CM | POA: Diagnosis not present

## 2016-03-24 DIAGNOSIS — O99331 Smoking (tobacco) complicating pregnancy, first trimester: Secondary | ICD-10-CM | POA: Insufficient documentation

## 2016-03-24 DIAGNOSIS — B9689 Other specified bacterial agents as the cause of diseases classified elsewhere: Secondary | ICD-10-CM

## 2016-03-24 DIAGNOSIS — N760 Acute vaginitis: Secondary | ICD-10-CM

## 2016-03-24 DIAGNOSIS — O210 Mild hyperemesis gravidarum: Secondary | ICD-10-CM

## 2016-03-24 DIAGNOSIS — O211 Hyperemesis gravidarum with metabolic disturbance: Secondary | ICD-10-CM | POA: Insufficient documentation

## 2016-03-24 LAB — URINALYSIS, ROUTINE W REFLEX MICROSCOPIC
BILIRUBIN URINE: NEGATIVE
Glucose, UA: 250 mg/dL — AB
HGB URINE DIPSTICK: NEGATIVE
Ketones, ur: NEGATIVE mg/dL
Nitrite: NEGATIVE
PH: 6 (ref 5.0–8.0)
Protein, ur: NEGATIVE mg/dL
SPECIFIC GRAVITY, URINE: 1.01 (ref 1.005–1.030)

## 2016-03-24 LAB — URINE MICROSCOPIC-ADD ON: RBC / HPF: NONE SEEN RBC/hpf (ref 0–5)

## 2016-03-24 LAB — WET PREP, GENITAL
SPERM: NONE SEEN
Trich, Wet Prep: NONE SEEN
Yeast Wet Prep HPF POC: NONE SEEN

## 2016-03-24 MED ORDER — ONDANSETRON 4 MG PO TBDP: 4.0000 mg | ORAL_TABLET | Freq: Three times a day (TID) | ORAL | 2 refills | Status: DC | PRN

## 2016-03-24 MED ORDER — METRONIDAZOLE 500 MG PO TABS: 500.0000 mg | ORAL_TABLET | Freq: Two times a day (BID) | ORAL | 0 refills | Status: DC

## 2016-03-24 MED ORDER — M.V.I. ADULT IV INJ
INJECTION | Freq: Once | INTRAVENOUS | Status: AC
  Administered 2016-03-24: 15:00:00 via INTRAVENOUS
  Filled 2016-03-24: qty 10

## 2016-03-24 MED ORDER — ONDANSETRON 8 MG PO TBDP
8.0000 mg | ORAL_TABLET | Freq: Once | ORAL | Status: AC
  Administered 2016-03-24: 8 mg via ORAL
  Filled 2016-03-24: qty 1

## 2016-03-24 MED ORDER — LACTATED RINGERS IV BOLUS (SEPSIS)
1000.0000 mL | Freq: Once | INTRAVENOUS | Status: AC
  Administered 2016-03-24: 1000 mL via INTRAVENOUS

## 2016-03-24 NOTE — MAU Provider Note (Signed)
History   g4p1021 @ 6.1 WKS IN WITH NAUSEAS AND VOMITING OF PREGNANCY. STATES HAS NOT KEPT ANY FLUIDS OR SOLIDS DOWN FOR TWO DAYS. UNABLE TO VOID ON ADMIT. Also c/o discharge with odor.  CSN: [id number]  Arrival date & time 03/24/16  1340   None     Chief Complaint  Patient presents with  . Emesis During Pregnancy    HPI  Past Medical History:  Diagnosis Date  . GERD (gastroesophageal reflux disease)   . 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 Migraine     Past Surgical History:  Procedure Laterality Date  . NO PAST SURGERIES      Family History  Problem Relation Age of Onset  . Hypertension Mother   . Asthma Brother   . Anesthesia problems Neg Hx     Social History  Substance Use Topics  . Smoking status: Current Every Day Smoker    Packs/day: 0.25    Types: Cigars  . Smokeless tobacco: Never Used  . Alcohol use Yes     Comment: occasionally    OB History    Gravida Para Term Preterm AB Living   4 1 1  0 2 1   SAB TAB Ectopic Multiple Live Births   0 2 0 0 1      Review of Systems  Constitutional: Positive for fatigue.  HENT: Negative.   Eyes: Negative.   Respiratory: Negative.   Cardiovascular: Negative.   Gastrointestinal: Positive for nausea and vomiting.  Endocrine: Negative.   Genitourinary: Positive for difficulty urinating and vaginal discharge.  Musculoskeletal: Negative.   Skin: Negative.   Allergic/Immunologic: Negative.   Neurological: Negative.   Hematological: Negative.   Psychiatric/Behavioral: Negative.     Allergies  Review of patient's allergies indicates no known allergies.  Home Medications    BP 123/66   Pulse 93   Temp 98.8 F (37.1 C)   Resp 18   Ht 5' (1.524 m)   Wt 150 lb 1.3 oz (68.1 kg)   LMP 01/29/2016   SpO2 100%   BMI 29.31 kg/m   Physical Exam  Constitutional: She is oriented to person, place, and time. She appears well-developed and well-nourished.  HENT:  Head: Normocephalic.  Eyes: Pupils are  equal, round, and reactive to light.  Neck: Normal range of motion.  Cardiovascular: Normal rate, normal heart sounds and intact distal pulses.   Pulmonary/Chest: Effort normal and breath sounds normal.  Abdominal: Soft. Bowel sounds are normal.  Genitourinary: Vagina normal and uterus normal.  Musculoskeletal: Normal range of motion.  Neurological: She is alert and oriented to person, place, and time. She has normal reflexes.  Skin: Skin is warm and dry.  Psychiatric: She has a normal mood and affect. Her behavior is normal. Judgment and thought content normal.    MAU Course  Procedures (including critical care time)  Labs Reviewed  URINALYSIS, ROUTINE W REFLEX MICROSCOPIC (NOT [facility])   No results found.   No diagnosis found.    MDM  Nausea ans vomiting of pregnacy Dehydration BV  P: iv hydration. Will try zofran ODT to attempt in getting fluids to stay down. Pt states she works and cannot take phenergan it makes her sedated. Wet prep shows pos for clue. Chlamydia and gc culture. Retaining fluids after receiving zofran and desires d/c home

## 2016-03-24 NOTE — MAU Note (Signed)
Patient presents with vomiting unable to keep anything down for 2 days, patient tearful, unable to provide urine sample at this time.

## 2016-03-26 LAB — GC/CHLAMYDIA PROBE AMP (~~LOC~~) NOT AT ARMC
CHLAMYDIA, DNA PROBE: NEGATIVE
NEISSERIA GONORRHEA: NEGATIVE

## 2016-04-02 ENCOUNTER — Telehealth: Payer: Self-pay | Admitting: General Practice

## 2016-04-02 DIAGNOSIS — T3695XA Adverse effect of unspecified systemic antibiotic, initial encounter: Principal | ICD-10-CM

## 2016-04-02 DIAGNOSIS — B379 Candidiasis, unspecified: Secondary | ICD-10-CM

## 2016-04-02 MED ORDER — TERCONAZOLE 0.4 % VA CREA: 1.0000 | TOPICAL_CREAM | Freq: Every day | VAGINAL | 0 refills | Status: AC

## 2016-04-02 NOTE — Telephone Encounter (Signed)
Patient called and left message stating she is [redacted] weeks pregnant and is having a yellow discharge with itching. Called patient and asked if she took flagyl for a bacterial infection. Patient states she is still taking it. Discussed with patient it is important to finish the medication, taking as directed & that it may be causing a yeast infection which can happen with antibiotics. Told patient we will send in a prescription to her pharmacy to treat that. Patient verbalized understanding & had no questions

## 2016-06-18 NOTE — L&D Delivery Note (Signed)
Patient is a 26 y.o. now [id number] s/p NSVD at 7287w1d, who was admitted for IOL for post dates.  Delivery Note: delivery completed by Dr. Rachelle HoraMoss who was supervising Dr. Parke SimmersBland At 4:02 PM a viable female was delivered via Vaginal, Spontaneous (Presentation:direct OA ).  APGAR: 7/9 weight 7 lb 0.5 oz (3189 g).   Placenta status: intact 3VC  with the following complications: shoulder distocia 50sec .  Cord pH: pending  Anesthesia: epidural Episiotomy: None Lacerations: None Suture Repair: N/A Est. Blood Loss (mL): 300  Mom to postpartum.  Baby to Couplet care / Skin to Skin.  Marthenia RollingScott Kaye Mitro 05/10/2017, 4:21 PM  Head delivered direct OA. No nuchal cord present. Shoulder distocia for 50sec.  Maneuvers to deliver baby included mcroberts, fundal pressure and multiple attempts to corkscrew each shoulder.  Posterior shoulder delivered first.  Body delivered in usual fashion. Cord clamped and cut immediately by Dr. Parke SimmersBland w/ Infant resuscitated by nursing staff.   Stimulated and bulb suctioned with prompt spontaneous cry, 1min apgar 7.  Baby was then dried and taken to mother for skin/skin and placed on mother's chest. Cord blood drawn. Placenta delivered spontaneously with gentle cord traction. Fundus firm with massage and Pitocin. Perineum inspected and found to have no lacerations requiring suture repair. Marthenia RollingScott Elyza Whitt, DO PGY1 05/10/17, 4:21 PM

## 2016-08-22 ENCOUNTER — Encounter (HOSPITAL_COMMUNITY): Payer: Self-pay | Admitting: *Deleted

## 2016-08-22 ENCOUNTER — Ambulatory Visit (HOSPITAL_COMMUNITY)
Admission: EM | Admit: 2016-08-22 | Discharge: 2016-08-22 | Disposition: A | Discharge status: Home / Self Care | Payer: Medicaid Other | Attending: Internal Medicine | Admitting: Internal Medicine

## 2016-08-22 DIAGNOSIS — J209 Acute bronchitis, unspecified: Secondary | ICD-10-CM

## 2016-08-22 MED ORDER — AZITHROMYCIN 250 MG PO TABS: 250.0000 mg | ORAL_TABLET | Freq: Every day | ORAL | 0 refills | Status: DC

## 2016-08-22 MED ORDER — FLUCONAZOLE 200 MG PO TABS
ORAL_TABLET | ORAL | 1 refills | Status: DC
Start: 2016-08-22 — End: 2016-11-09

## 2016-08-22 MED ORDER — PREDNISONE 50 MG PO TABS: ORAL_TABLET | ORAL | 0 refills | Status: DC

## 2016-08-22 MED ORDER — BENZONATATE 100 MG PO CAPS: 100.0000 mg | ORAL_CAPSULE | Freq: Three times a day (TID) | ORAL | 0 refills | Status: DC

## 2016-08-22 NOTE — Discharge Instructions (Signed)
I'm treating you today for acute bronchitis. I prescribed azithromycin, take 2 tablets today then 1 tablet daily until finished. Also prescribed prednisone, take one tablet daily with food.For cough, I have prescribed a medication called Tessalon. Take 1 tablet every 8 hours as needed for your cough. As you have requested Diflucan, I have prescribed on. Do not take until after you have finished her antibiotics, and then only if symptomatic. Take one tablet weight 3 days then take a second tablet. Should your symptoms persist, follow up with her primary care provider, return to clinic as needed.

## 2016-08-22 NOTE — ED Provider Notes (Signed)
CSN: [id number]     Arrival date & time 08/22/16  1549 History   First MD Initiated Contact with Patient 08/22/16 1636     Chief Complaint  Patient presents with  . Cough   (Consider location/radiation/quality/duration/timing/severity/associated sxs/prior Treatment) 26 year old patient presents to clinic with a seven-day a history of cough, congestion, and fatigue. She describes her cough as being productive, initially clear then changing to yellow sputum, and she also reports some shortness of breath, with wheezing. She has no history of asthma, however she is a smoker, reporting she smokes several "black and mild" a day. She denies any chest pain, does have some weakness, loss of appetite, and fatigue. She denies any nausea, vomiting, or diarrhea, she denies any abdominal pain, she denies any pressure or ringing in her ears, any blurred vision, any swelling in her hands, feet, or ankles.   The history is provided by the patient.  Cough    Past Medical History:  Diagnosis Date  . GERD (gastroesophageal reflux disease)   . 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 Migraine    Past Surgical History:  Procedure Laterality Date  . NO PAST SURGERIES     Family History  Problem Relation Age of Onset  . Hypertension Mother   . Asthma Brother   . Anesthesia problems Neg Hx    Social History  Substance Use Topics  . Smoking status: Current Every Day Smoker    Packs/day: 0.25    Types: Cigars  . Smokeless tobacco: Never Used  . Alcohol use Yes     Comment: occasionally   OB History    Gravida Para Term Preterm AB Living   4 1 1  0 2 1   SAB TAB Ectopic Multiple Live Births   0 2 0 0 1     Review of Systems  Reason unable to perform ROS: As covered in history of present illness.  Respiratory: Positive for cough.   All other systems reviewed and are negative.   Allergies  Patient has no known allergies.  Home Medications   Prior to Admission medications   Medication Sig  Start Date End Date Taking? Authorizing Provider  azithromycin (ZITHROMAX) 250 MG tablet Take 1 tablet (250 mg total) by mouth daily. Take first 2 tablets together, then 1 every day until finished. 08/22/16   Dorena BodoLawrence Semir Brill, NP  benzonatate (TESSALON) 100 MG capsule Take 1 capsule (100 mg total) by mouth every 8 (eight) hours. 08/22/16   Dorena BodoLawrence Mavryk Pino, NP  fluconazole (DIFLUCAN) 200 MG tablet After completing the antibiotics, and if symptomatic, take one tablet followed by a second after 3 days 08/22/16   Dorena BodoLawrence Crew Goren, NP  predniSONE (DELTASONE) 50 MG tablet Take 1 tablet daily with food 08/22/16   Dorena BodoLawrence Kiev Labrosse, NP   Meds Ordered and Administered this Visit  Medications - No data to display  BP 126/80 (BP Location: Right Arm)   Pulse 78   Temp 99.2 F (37.3 C)   Resp 18   LMP 08/01/2015   SpO2 100%   Breastfeeding? No  No data found.   Physical Exam  Constitutional: She is oriented to person, place, and time. She appears well-developed and well-nourished. She appears ill. No distress.  HENT:  Head: Normocephalic and atraumatic.  Right Ear: Tympanic membrane and external ear normal.  Left Ear: Tympanic membrane and external ear normal.  Nose: Nose normal. Right sinus exhibits no maxillary sinus tenderness and no frontal sinus tenderness. Left sinus exhibits  no maxillary sinus tenderness and no frontal sinus tenderness.  Mouth/Throat: Uvula is midline, oropharynx is clear and moist and mucous membranes are normal. No oropharyngeal exudate. Tonsils are 2+ on the right. Tonsils are 2+ on the left. No tonsillar exudate.  Eyes: Pupils are equal, round, and reactive to light.  Neck: Normal range of motion. Neck supple. No JVD present.  Cardiovascular: Normal rate and regular rhythm.   Pulmonary/Chest: Effort normal. No respiratory distress. She has no wheezes. She has rhonchi in the right lower field and the left lower field.  Abdominal: Soft. Bowel sounds are normal. She exhibits no  distension. There is no tenderness. There is no guarding.  Lymphadenopathy:       Head (right side): No submental, no submandibular, no tonsillar and no preauricular adenopathy present.       Head (left side): No submental, no submandibular, no tonsillar and no preauricular adenopathy present.    She has no cervical adenopathy.  Neurological: She is alert and oriented to person, place, and time.  Skin: Skin is warm and dry. Capillary refill takes less than 2 seconds. She is not diaphoretic.  Psychiatric: She has a normal mood and affect.  Nursing note and vitals reviewed.   Urgent Care Course     Procedures (including critical care time)  Labs Review Labs Reviewed - No data to display  Imaging Review No results found.    MDM   1. Acute bronchitis, unspecified organism    I'm treating you today for acute bronchitis. I prescribed azithromycin, take 2 tablets today then 1 tablet daily until finished. Also prescribed prednisone, take one tablet daily with food.For cough, I have prescribed a medication called Tessalon. Take 1 tablet every 8 hours as needed for your cough. As you have requested Diflucan, I have prescribed on. Do not take until after you have finished her antibiotics, and then only if symptomatic. Take one tablet weight 3 days then take a second tablet. Should your symptoms persist, follow up with her primary care provider, return to clinic as needed.      Dorena Bodo, NP 08/22/16 [phone]

## 2016-08-22 NOTE — ED Triage Notes (Signed)
Cough   X  1   Weeks     Body  Aches   soerethroat  And  Headache  X  3  Days

## 2016-10-01 ENCOUNTER — Ambulatory Visit (INDEPENDENT_AMBULATORY_CARE_PROVIDER_SITE_OTHER): Payer: Medicaid Other

## 2016-10-01 DIAGNOSIS — Z3201 Encounter for pregnancy test, result positive: Secondary | ICD-10-CM | POA: Diagnosis not present

## 2016-10-01 DIAGNOSIS — Z3200 Encounter for pregnancy test, result unknown: Secondary | ICD-10-CM

## 2016-10-01 LAB — POCT URINE PREGNANCY: Preg Test, Ur: POSITIVE — AB

## 2016-10-01 MED ORDER — PRENATAL PLUS 27-1 MG PO TABS: 1.0000 | ORAL_TABLET | Freq: Every day | ORAL | 5 refills | Status: DC

## 2016-10-01 NOTE — Progress Notes (Signed)
Patient is in the office for UPT, results positive, LMP 07-26-16, EDD 05-02-17. Pt is 9w 4d.

## 2016-10-25 DIAGNOSIS — Z3490 Encounter for supervision of normal pregnancy, unspecified, unspecified trimester: Secondary | ICD-10-CM | POA: Insufficient documentation

## 2016-10-26 ENCOUNTER — Other Ambulatory Visit (HOSPITAL_COMMUNITY)
Admission: RE | Admit: 2016-10-26 | Discharge: 2016-10-26 | Disposition: A | Discharge status: Home / Self Care | Payer: Medicaid Other | Source: Ambulatory Visit | Attending: Obstetrics | Admitting: Obstetrics

## 2016-10-26 ENCOUNTER — Ambulatory Visit (INDEPENDENT_AMBULATORY_CARE_PROVIDER_SITE_OTHER): Payer: Medicaid Other | Admitting: Obstetrics

## 2016-10-26 ENCOUNTER — Encounter: Payer: Self-pay | Admitting: Obstetrics

## 2016-10-26 VITALS — BP 106/69 | HR 96 | Temp 98.3°F | Wt 158.0 lb

## 2016-10-26 DIAGNOSIS — Z3481 Encounter for supervision of other normal pregnancy, first trimester: Secondary | ICD-10-CM | POA: Diagnosis not present

## 2016-10-26 DIAGNOSIS — O219 Vomiting of pregnancy, unspecified: Secondary | ICD-10-CM

## 2016-10-26 DIAGNOSIS — Z3490 Encounter for supervision of normal pregnancy, unspecified, unspecified trimester: Secondary | ICD-10-CM

## 2016-10-26 DIAGNOSIS — A6004 Herpesviral vulvovaginitis: Secondary | ICD-10-CM

## 2016-10-26 DIAGNOSIS — Z3480 Encounter for supervision of other normal pregnancy, unspecified trimester: Secondary | ICD-10-CM | POA: Insufficient documentation

## 2016-10-26 DIAGNOSIS — Z3A00 Weeks of gestation of pregnancy not specified: Secondary | ICD-10-CM | POA: Diagnosis not present

## 2016-10-26 DIAGNOSIS — O09299 Supervision of pregnancy with other poor reproductive or obstetric history, unspecified trimester: Secondary | ICD-10-CM

## 2016-10-26 DIAGNOSIS — Z3492 Encounter for supervision of normal pregnancy, unspecified, second trimester: Secondary | ICD-10-CM

## 2016-10-26 DIAGNOSIS — R8271 Bacteriuria: Secondary | ICD-10-CM

## 2016-10-26 DIAGNOSIS — O09292 Supervision of pregnancy with other poor reproductive or obstetric history, second trimester: Secondary | ICD-10-CM

## 2016-10-26 LAB — OB RESULTS CONSOLE GBS: GBS: POSITIVE

## 2016-10-26 MED ORDER — VALACYCLOVIR HCL 1 G PO TABS: 1000.0000 mg | ORAL_TABLET | Freq: Two times a day (BID) | ORAL | 99 refills | Status: DC | PRN

## 2016-10-26 MED ORDER — DOXYLAMINE-PYRIDOXINE 10-10 MG PO TBEC: DELAYED_RELEASE_TABLET | ORAL | 5 refills | Status: DC

## 2016-10-26 NOTE — Progress Notes (Signed)
Subjective:  Stacey [name] is a 26 y.o. G5P1021 at 6525w1d being seen today for ongoing prenatal care.  She is currently monitored for the following issues for this low-risk pregnancy and has Smoker; Major depressive disorder, single episode, severe without psychosis (HCC); Major depressive disorder, recurrent, severe without psychotic features (HCC); Suicidal ideation; Menorrhagia with regular cycle; and Supervision of normal pregnancy, antepartum on her problem list.  Patient reports nausea.  Contractions: Not present. Vag. Bleeding: None.   . Denies leaking of fluid.   The following portions of the patient's history were reviewed and updated as appropriate: allergies, current medications, past family history, past medical history, past social history, past surgical history and problem list. Problem list updated.  Objective:   Vitals:   10/26/16 1042  BP: 106/69  Pulse: 96  Temp: 98.3 F (36.8 C)  Weight: 158 lb (71.7 kg)    Fetal Status: Fetal Heart Rate (bpm): 150         General:  Alert, oriented and cooperative. Patient is in no acute distress.  Skin: Skin is warm and dry. No rash noted.   Cardiovascular: Normal heart rate noted  Respiratory: Normal respiratory effort, no problems with respiration noted  Abdomen: Soft, gravid, appropriate for gestational age. Pain/Pressure: Absent     Pelvic:  Cervical exam deferred        Extremities: Normal range of motion.  Edema: None  Mental Status: Normal mood and affect. Normal behavior. Normal judgment and thought content.   Urinalysis:      Assessment and Plan:  Pregnancy: G5P1021 at 7925w1d  1. Encounter for supervision of normal pregnancy, antepartum, unspecified gravidity  2. Nausea and Vomiting Rx: - Diclegis Rx  3. History of Genital Herpes.  No outbreaks during pregnancy. Rx: - Valtrex Rx  Preterm labor symptoms and general obstetric precautions including but not limited to vaginal bleeding, contractions, leaking of fluid  and fetal movement were reviewed in detail with the patient. Please refer to After Visit Summary for other counseling recommendations.  Return in 2 weeks (on 11/09/2016) for ROB.   Brock BadHarper, Mechell Girgis A, MDPatient ID: Stacey [name], female   DOB: March 16, 1991, 26 y.o.   MRN: [id number]

## 2016-10-26 NOTE — Patient Instructions (Addendum)
Morning Sickness Morning sickness is when you feel sick to your stomach (nauseous) during pregnancy. This nauseous feeling may or may not come with vomiting. It often occurs in the morning but can be a problem any time of day. Morning sickness is most common during the first trimester, but it may continue throughout pregnancy. While morning sickness is unpleasant, it is usually harmless unless you develop severe and continual vomiting (hyperemesis gravidarum). This condition requires more intense treatment. What are the causes? The cause of morning sickness is not completely known but seems to be related to normal hormonal changes that occur in pregnancy. What increases the risk? You are at greater risk if you:  Experienced nausea or vomiting before your pregnancy.  Had morning sickness during a previous pregnancy.  Are pregnant with more than one baby, such as twins. How is this treated? Do not use any medicines (prescription, over-the-counter, or herbal) for morning sickness without first talking to your health care provider. Your health care provider may prescribe or recommend:  Vitamin B6 supplements.  Anti-nausea medicines.  The herbal medicine ginger. Follow these instructions at home:  Only take over-the-counter or prescription medicines as directed by your health care provider.  Taking multivitamins before getting pregnant can prevent or decrease the severity of morning sickness in most women.  Eat a piece of dry toast or unsalted crackers before getting out of bed in the morning.  Eat five or six small meals a day.  Eat dry and bland foods (rice, baked potato). Foods high in carbohydrates are often helpful.  Do not drink liquids with your meals. Drink liquids between meals.  Avoid greasy, fatty, and spicy foods.  Get someone to cook for you if the smell of any food causes nausea and vomiting.  If you feel nauseous after taking prenatal vitamins, take the vitamins at  night or with a snack.  Snack on protein foods (nuts, yogurt, cheese) between meals if you are hungry.  Eat unsweetened gelatins for desserts.  Wearing an acupressure wristband (worn for sea sickness) may be helpful.  Acupuncture may be helpful.  Do not smoke.  Get a humidifier to keep the air in your house free of odors.  Get plenty of fresh air. Contact a health care provider if:  Your home remedies are not working, and you need medicine.  You feel dizzy or lightheaded.  You are losing weight. Get help right away if:  You have persistent and uncontrolled nausea and vomiting.  You pass out (faint). This information is not intended to replace advice given to you by your health care provider. Make sure you discuss any questions you have with your health care provider. Document Released: 07/26/2006 Document Revised: 11/10/2015 Document Reviewed: 11/19/2012 Elsevier Interactive Patient Education  2017 [employer]  Hyperemesis Gravidarum Hyperemesis gravidarum is a severe form of nausea and vomiting that happens during pregnancy. Hyperemesis is worse than morning sickness. It may cause you to have nausea or vomiting all day for many days. It may keep you from eating and drinking enough food and liquids. Hyperemesis usually occurs during the first half (the first 20 weeks) of pregnancy. It often goes away once a woman is in her second half of pregnancy. However, sometimes hyperemesis continues through an entire pregnancy. What are the causes? The cause of this condition is not known. It may be related to changes in chemicals (hormones) in the body during pregnancy, such as the high level of pregnancy hormone (human chorionic gonadotropin) or the increase  in the female sex hormone (estrogen). What are the signs or symptoms? Symptoms of this condition include:  Severe nausea and vomiting.  Nausea that does not go away.  Vomiting that does not allow you to keep any food  down.  Weight loss.  Body fluid loss (dehydration).  Having no desire to eat, or not liking food that you have previously enjoyed. How is this diagnosed? This condition may be diagnosed based on:  A physical exam.  Your medical history.  Your symptoms.  Blood tests.  Urine tests. How is this treated? This condition may be managed with medicine. If medicines to do not help relieve nausea and vomiting, you may need to receive fluids through an IV tube at the hospital. Follow these instructions at home:  Take over-the-counter and prescription medicines only as told by your health care provider.  Avoid iron pills and multivitamins that contain iron for the first 3-4 months of pregnancy. If you take prescription iron pills, do not stop taking them unless your health care provider approves.  Take the following actions to help prevent nausea and vomiting:  In the morning, before getting out of bed, try eating a couple of dry crackers or a piece of toast.  Avoid foods and smells that upset your stomach. Fatty and spicy foods may make nausea worse.  Eat 5-6 small meals a day.  Do not drink fluids while eating meals. Drink between meals.  Eat or suck on things that have ginger in them. Ginger can help relieve nausea.  Avoid food preparation. The smell of food can spoil your appetite or trigger nausea.  Follow instructions from your health care provider about eating or drinking restrictions.  For snacks, eat high-protein foods, such as cheese.  Keep all follow-up and pre-birth (prenatal) visits as told by your health care provider. This is important. Contact a health care provider if:  You have pain in your abdomen.  You have a severe headache.  You have vision problems.  You are losing weight. Get help right away if:  You cannot drink fluids without vomiting.  You vomit blood.  You have constant nausea and vomiting.  You are very weak.  You are very  thirsty.  You feel dizzy.  You faint.  You have a fever or other symptoms that last for more than 2-3 days.  You have a fever and your symptoms suddenly get worse. Summary  Hyperemesis gravidarum is a severe form of nausea and vomiting that happens during pregnancy.  Making some changes to your eating habits may help relieve nausea and vomiting.  This condition may be managed with medicine.  If medicines to do not help relieve nausea and vomiting, you may need to receive fluids through an IV tube at the hospital. This information is not intended to replace advice given to you by your health care provider. Make sure you discuss any questions you have with your health care provider. Document Released: 06/04/2005 Document Revised: 02/01/2016 Document Reviewed: 02/01/2016 Elsevier Interactive Patient Education  2017 [employer].  Heartburn During Pregnancy Heartburn is pain or discomfort in the throat or chest. It may cause a burning feeling. It happens when stomach acid moves up into the tube that carries food from your mouth to your stomach (esophagus). Heartburn is common during pregnancy. It usually goes away or gets better after giving birth. Follow these instructions at home: Eating and drinking   Do not drink alcohol while you are pregnant.  Figure out which foods and  beverages make you feel worse, and avoid them.  Beverages that you may want to avoid include:  Coffee and tea (with or without caffeine).  Energy drinks and sports drinks.  Bubbly (carbonated) drinks or sodas.  Citrus fruit juices.  Foods that you may want to avoid include:  Chocolate and cocoa.  Peppermint and mint flavorings.  Garlic, onions, and horseradish.  Spicy and acidic foods. These include peppers, chili powder, curry powder, vinegar, hot sauces, and barbecue sauce.  Citrus fruits, such as oranges, lemons, and limes.  Tomato-based foods, such as red sauce, chili, and salsa.  Fried  and fatty foods, such as donuts, french fries, potato chips, and high-fat dressings.  High-fat meats, such as hot dogs, cold cuts, sausage, ham, and bacon.  High-fat dairy items, such as whole milk, butter, and cheese.  Eat small meals often, instead of large meals.  Avoid drinking a lot of liquid with your meals.  Avoid eating meals during the 2-3 hours before you go to bed.  Avoid lying down right after you eat.  Do not exercise right after you eat. Medicines   Take over-the-counter and prescription medicines only as told by your doctor.  Do not take aspirin, ibuprofen, or other NSAIDs unless your doctor tells you to do that.  Your doctor may tell you to avoid medicines that have sodium bicarbonate in them. General instructions   If told, raise the head of your bed about 6 inches (15 cm). You can do this by putting blocks under the legs. Sleeping with more pillows does not help with heartburn.  Do not use any products that contain nicotine or tobacco, such as cigarettes and e-cigarettes. If you need help quitting, ask your doctor.  Wear loose-fitting clothing.  Try to lower your stress, such as with yoga or meditation. If you need help, ask your doctor.  Stay at a healthy weight. If you are overweight, work with your doctor to safely lose weight.  Keep all follow-up visits as told by your doctor. This is important. Contact a doctor if:  You get new symptoms.  Your symptoms do not get better with treatment.  You have weight loss and you do not know why.  You have trouble swallowing.  You make loud sounds when you breathe (wheeze).  You have a cough that does not go away.  You have heartburn often for more than 2 weeks.  You feel sick to your stomach (nauseous), and this does not get better with treatment.  You are throwing up (vomiting), and this does not get better with treatment.  You have pain in your belly (abdomen). Get help right away if:  You have  very bad chest pain that spreads to your arm, neck, or jaw.  You feel sweaty, dizzy, or light-headed.  You have trouble breathing.  You have pain when swallowing.  You throw up and your throw-up looks like blood or coffee grounds.  Your poop (stool) is bloody or black. This information is not intended to replace advice given to you by your health care provider. Make sure you discuss any questions you have with your health care provider. Document Released: 07/07/2010 Document Revised: 02/20/2016 Document Reviewed: 02/20/2016 Elsevier Interactive Patient Education  2017 [employer]

## 2016-10-26 NOTE — Progress Notes (Signed)
Patient presents for New OB visit.  

## 2016-10-30 LAB — CERVICOVAGINAL ANCILLARY ONLY
BACTERIAL VAGINITIS: NEGATIVE
CANDIDA VAGINITIS: NEGATIVE
CHLAMYDIA, DNA PROBE: NEGATIVE
Neisseria Gonorrhea: NEGATIVE
TRICH (WINDOWPATH): NEGATIVE

## 2016-10-30 LAB — CYTOLOGY - PAP: DIAGNOSIS: NEGATIVE

## 2016-10-31 LAB — URINE CULTURE, OB REFLEX

## 2016-10-31 LAB — CULTURE, OB URINE

## 2016-11-01 ENCOUNTER — Other Ambulatory Visit: Payer: Self-pay | Admitting: Obstetrics

## 2016-11-01 DIAGNOSIS — N3000 Acute cystitis without hematuria: Secondary | ICD-10-CM

## 2016-11-01 MED ORDER — AMOXICILLIN-POT CLAVULANATE 875-125 MG PO TABS: 1.0000 | ORAL_TABLET | Freq: Two times a day (BID) | ORAL | 0 refills | Status: DC

## 2016-11-02 ENCOUNTER — Other Ambulatory Visit: Payer: Self-pay | Admitting: Obstetrics

## 2016-11-02 LAB — COMPREHENSIVE METABOLIC PANEL
ALBUMIN: 4.1 g/dL (ref 3.5–5.5)
ALK PHOS: 49 IU/L (ref 39–117)
ALT: 14 IU/L (ref 0–32)
AST: 15 IU/L (ref 0–40)
Albumin/Globulin Ratio: 1.9 (ref 1.2–2.2)
BUN / CREAT RATIO: 11 (ref 9–23)
BUN: 6 mg/dL (ref 6–20)
Bilirubin Total: <0.2 mg/dL (ref 0.0–1.2)
CO2: 18 mmol/L (ref 18–29)
CREATININE: 0.55 mg/dL — AB (ref 0.57–1.00)
Calcium: 9.1 mg/dL (ref 8.7–10.2)
Chloride: 101 mmol/L (ref 96–106)
GFR, EST AFRICAN AMERICAN: 152 mL/min/{1.73_m2} (ref >59)
GFR, EST NON AFRICAN AMERICAN: 132 mL/min/{1.73_m2} (ref >59)
GLOBULIN, TOTAL: 2.2 g/dL (ref 1.5–4.5)
Glucose: 91 mg/dL (ref 65–99)
Potassium: 3.9 mmol/L (ref 3.5–5.2)
Sodium: 135 mmol/L (ref 134–144)
TOTAL PROTEIN: 6.3 g/dL (ref 6.0–8.5)

## 2016-11-02 LAB — HEMOGLOBINOPATHY EVALUATION
HEMOGLOBIN A2 QUANTITATION: 2.5 % (ref 1.8–3.2)
HEMOGLOBIN F QUANTITATION: 0 % (ref 0.0–2.0)
HGB C: 0 %
HGB S: 0 %
HGB VARIANT: 0 %
Hgb A: 97.5 % (ref 96.4–98.8)

## 2016-11-02 LAB — OBSTETRIC PANEL, INCLUDING HIV
Antibody Screen: NEGATIVE
BASOS ABS: 0 10*3/uL (ref 0.0–0.2)
Basos: 0 %
EOS (ABSOLUTE): 0.2 10*3/uL (ref 0.0–0.4)
Eos: 2 %
HEP B S AG: NEGATIVE
HIV SCREEN 4TH GENERATION: NONREACTIVE
Hematocrit: 34.1 % (ref 34.0–46.6)
Hemoglobin: 11 g/dL — ABNORMAL LOW (ref 11.1–15.9)
IMMATURE GRANULOCYTES: 0 %
Immature Grans (Abs): 0 10*3/uL (ref 0.0–0.1)
LYMPHS ABS: 2.2 10*3/uL (ref 0.7–3.1)
Lymphs: 22 %
MCH: 27.4 pg (ref 26.6–33.0)
MCHC: 32.3 g/dL (ref 31.5–35.7)
MCV: 85 fL (ref 79–97)
MONOS ABS: 0.6 10*3/uL (ref 0.1–0.9)
Monocytes: 6 %
NEUTROS ABS: 7 10*3/uL (ref 1.4–7.0)
NEUTROS PCT: 70 %
PLATELETS: 322 10*3/uL (ref 150–379)
RBC: 4.01 x10E6/uL (ref 3.77–5.28)
RDW: 15.7 % — AB (ref 12.3–15.4)
RPR: NONREACTIVE
Rh Factor: POSITIVE
Rubella Antibodies, IGG: 1.71 index (ref >0.99)
WBC: 10.1 10*3/uL (ref 3.4–10.8)

## 2016-11-02 LAB — VARICELLA ZOSTER ANTIBODY, IGG: Varicella zoster IgG: 221 index (ref >165)

## 2016-11-02 LAB — CYSTIC FIBROSIS MUTATION 97: GENE DIS ANAL CARRIER INTERP BLD/T-IMP: NOT DETECTED

## 2016-11-02 LAB — HEMOGLOBIN A1C
Est. average glucose Bld gHb Est-mCnc: 97 mg/dL
Hgb A1c MFr Bld: 5 % (ref 4.8–5.6)

## 2016-11-08 ENCOUNTER — Encounter: Payer: Self-pay | Admitting: Obstetrics & Gynecology

## 2016-11-09 DIAGNOSIS — A6004 Herpesviral vulvovaginitis: Secondary | ICD-10-CM | POA: Insufficient documentation

## 2016-11-09 DIAGNOSIS — O09293 Supervision of pregnancy with other poor reproductive or obstetric history, third trimester: Secondary | ICD-10-CM

## 2016-11-09 DIAGNOSIS — O09299 Supervision of pregnancy with other poor reproductive or obstetric history, unspecified trimester: Secondary | ICD-10-CM

## 2016-11-09 DIAGNOSIS — R8271 Bacteriuria: Secondary | ICD-10-CM | POA: Insufficient documentation

## 2016-11-09 DIAGNOSIS — O219 Vomiting of pregnancy, unspecified: Secondary | ICD-10-CM | POA: Insufficient documentation

## 2016-11-09 HISTORY — DX: Herpesviral vulvovaginitis: A60.04

## 2016-11-09 HISTORY — DX: Supervision of pregnancy with other poor reproductive or obstetric history, third trimester: O09.293

## 2016-11-09 NOTE — Addendum Note (Signed)
Addended by: Reva BoresPRATT, Rolande Moe S on: 11/09/2016 08:16 AM   Modules accepted: Orders

## 2016-11-22 ENCOUNTER — Ambulatory Visit (INDEPENDENT_AMBULATORY_CARE_PROVIDER_SITE_OTHER): Payer: Medicaid Other | Admitting: Obstetrics & Gynecology

## 2016-11-22 VITALS — BP 94/53 | HR 118 | Wt 162.0 lb

## 2016-11-22 DIAGNOSIS — Z3492 Encounter for supervision of normal pregnancy, unspecified, second trimester: Secondary | ICD-10-CM

## 2016-11-22 DIAGNOSIS — Z3490 Encounter for supervision of normal pregnancy, unspecified, unspecified trimester: Secondary | ICD-10-CM

## 2016-11-22 NOTE — Progress Notes (Signed)
   PRENATAL VISIT NOTE  Subjective:  Stacey SpatesDanasia J [name] is a 26 y.o. G5P1021 at 6413w0d being seen today for ongoing prenatal care.  She is currently monitored for the following issues for this low-risk pregnancy and has Smoker; Major depressive disorder, recurrent, severe without psychotic features (HCC); Supervision of normal pregnancy, antepartum; History of pre-eclampsia in prior pregnancy, currently pregnant; Herpes simplex vulvovaginitis; Nausea and vomiting during pregnancy prior to [redacted] weeks gestation; and Group B streptococcal bacteriuria on her problem list.  Patient reports nausea.  Contractions: Not present. Vag. Bleeding: None.  Movement: Present. Denies leaking of fluid.   The following portions of the patient's history were reviewed and updated as appropriate: allergies, current medications, past family history, past medical history, past social history, past surgical history and problem list. Problem list updated.  Objective:   Vitals:   11/22/16 1549  BP: (!) 94/53  Pulse: (!) 118  Weight: 162 lb (73.5 kg)    Fetal Status: Fetal Heart Rate (bpm): 155   Movement: Present     General:  Alert, oriented and cooperative. Patient is in no acute distress.  Skin: Skin is warm and dry. No rash noted.   Cardiovascular: Normal heart rate noted  Respiratory: Normal respiratory effort, no problems with respiration noted  Abdomen: Soft, gravid, appropriate for gestational age. Pain/Pressure: Present     Pelvic:  Cervical exam deferred        Extremities: Normal range of motion.  Edema: None  Mental Status: Normal mood and affect. Normal behavior. Normal judgment and thought content.   Assessment and Plan:  Pregnancy: G5P1021 at 7013w0d  1. Encounter for supervision of normal pregnancy, antepartum, unspecified gravidity screening - AFP TETRA  Preterm labor symptoms and general obstetric precautions including but not limited to vaginal bleeding, contractions, leaking of fluid and fetal  movement were reviewed in detail with the patient. Please refer to After Visit Summary for other counseling recommendations.  4 weeks, schedule [place] 1-2 weeks  Scheryl DarterJames Zella Dewan, MD

## 2016-11-22 NOTE — Progress Notes (Signed)
Pt c/o low abdominal pain.

## 2016-11-22 NOTE — Patient Instructions (Signed)

## 2016-11-27 LAB — AFP TETRA
DIA Mom Value: 1.06
DIA Value (EIA): 167.69 pg/mL
DSR (BY AGE) 1 IN: 1012
DSR (Second Trimester) 1 IN: 378
GESTATIONAL AGE AFP: 17 wk
MSAFP MOM: 0.77
MSAFP: 30.3 ng/mL
MSHCG MOM: 1.61
MSHCG: 48502 m[IU]/mL
Maternal Age At EDD: 25.4 yr
Osb Risk: 10000
Test Results:: NEGATIVE
UE3 MOM: 0.53
UE3 VALUE: 0.52 ng/mL
Weight: 162 [lb_av]

## 2016-12-04 ENCOUNTER — Other Ambulatory Visit: Payer: Self-pay | Admitting: *Deleted

## 2016-12-04 DIAGNOSIS — Z3492 Encounter for supervision of normal pregnancy, unspecified, second trimester: Secondary | ICD-10-CM

## 2016-12-04 NOTE — Progress Notes (Signed)
U/s order changed per MFM  °

## 2016-12-07 ENCOUNTER — Other Ambulatory Visit: Payer: Self-pay | Admitting: Obstetrics & Gynecology

## 2016-12-07 ENCOUNTER — Ambulatory Visit (HOSPITAL_COMMUNITY)
Admission: RE | Admit: 2016-12-07 | Discharge: 2016-12-07 | Disposition: A | Discharge status: Home / Self Care | Payer: Medicaid Other | Source: Ambulatory Visit | Attending: Obstetrics & Gynecology | Admitting: Obstetrics & Gynecology

## 2016-12-07 ENCOUNTER — Encounter (HOSPITAL_COMMUNITY): Payer: Self-pay

## 2016-12-07 DIAGNOSIS — Z3490 Encounter for supervision of normal pregnancy, unspecified, unspecified trimester: Secondary | ICD-10-CM

## 2016-12-07 DIAGNOSIS — Z6831 Body mass index (BMI) 31.0-31.9, adult: Secondary | ICD-10-CM | POA: Diagnosis not present

## 2016-12-07 DIAGNOSIS — E669 Obesity, unspecified: Secondary | ICD-10-CM | POA: Insufficient documentation

## 2016-12-07 DIAGNOSIS — Z3A19 19 weeks gestation of pregnancy: Secondary | ICD-10-CM | POA: Diagnosis not present

## 2016-12-07 DIAGNOSIS — O09292 Supervision of pregnancy with other poor reproductive or obstetric history, second trimester: Secondary | ICD-10-CM | POA: Diagnosis not present

## 2016-12-07 DIAGNOSIS — O99212 Obesity complicating pregnancy, second trimester: Secondary | ICD-10-CM | POA: Diagnosis present

## 2016-12-20 ENCOUNTER — Encounter: Payer: Self-pay | Admitting: Obstetrics & Gynecology

## 2016-12-20 ENCOUNTER — Ambulatory Visit (INDEPENDENT_AMBULATORY_CARE_PROVIDER_SITE_OTHER): Payer: Medicaid Other | Admitting: Obstetrics & Gynecology

## 2016-12-20 ENCOUNTER — Other Ambulatory Visit (HOSPITAL_COMMUNITY)
Admission: RE | Admit: 2016-12-20 | Discharge: 2016-12-20 | Disposition: A | Discharge status: Home / Self Care | Payer: Medicaid Other | Source: Ambulatory Visit | Attending: Obstetrics & Gynecology | Admitting: Obstetrics & Gynecology

## 2016-12-20 VITALS — BP 108/65 | HR 101 | Wt 164.0 lb

## 2016-12-20 DIAGNOSIS — Z3480 Encounter for supervision of other normal pregnancy, unspecified trimester: Secondary | ICD-10-CM | POA: Insufficient documentation

## 2016-12-20 DIAGNOSIS — Z3482 Encounter for supervision of other normal pregnancy, second trimester: Secondary | ICD-10-CM

## 2016-12-20 DIAGNOSIS — M5430 Sciatica, unspecified side: Secondary | ICD-10-CM

## 2016-12-20 NOTE — Progress Notes (Signed)
   PRENATAL VISIT NOTE  Subjective:  Stacey SpatesDanasia J [name] is a 26 y.o. G5P1021 at 2477w0d being seen today for ongoing prenatal care.  She is currently monitored for the following issues for this low-risk pregnancy and has Smoker; Major depressive disorder, recurrent, severe without psychotic features (HCC); Supervision of normal pregnancy, antepartum; History of pre-eclampsia in prior pregnancy, currently pregnant; Herpes simplex vulvovaginitis; Nausea and vomiting during pregnancy prior to [redacted] weeks gestation; and Group B streptococcal bacteriuria on her problem list.  Patient reports backache and vaginal irritation.  Contractions: Not present. Vag. Bleeding: None.  Movement: Present. Denies leaking of fluid.   The following portions of the patient's history were reviewed and updated as appropriate: allergies, current medications, past family history, past medical history, past social history, past surgical history and problem list. Problem list updated.  Objective:   Vitals:   12/20/16 1305  BP: 108/65  Pulse: (!) 101  Weight: 164 lb (74.4 kg)    Fetal Status: Fetal Heart Rate (bpm): 150   Movement: Present     General:  Alert, oriented and cooperative. Patient is in no acute distress.  Skin: Skin is warm and dry. No rash noted.   Cardiovascular: Normal heart rate noted  Respiratory: Normal respiratory effort, no problems with respiration noted  Abdomen: Soft, gravid, appropriate for gestational age. Pain/Pressure: Present     Pelvic:  Cervical exam deferred        Extremities: Normal range of motion.  Edema: None  Mental Status: Normal mood and affect. Normal behavior. Normal judgment and thought content.   Assessment and Plan:  Pregnancy: G5P1021 at 8977w0d  1. Supervision of other normal pregnancy, antepartum Wet prep for D/C - NuSwab VG, Candida 6sp  2. Sciatic leg pain To see Dr Adrian BlackwaterStinson  Preterm labor symptoms and general obstetric precautions including but not limited to vaginal  bleeding, contractions, leaking of fluid and fetal movement were reviewed in detail with the patient. Please refer to After Visit Summary for other counseling recommendations.  Return in about 4 weeks (around 01/17/2017).   Scheryl DarterJames Keyondre Hepburn, MD

## 2016-12-20 NOTE — Progress Notes (Signed)
Patient complains of white, thick discharge. Hands tingle and goes numb often especially when she sleeps, she is also constipated.

## 2016-12-20 NOTE — Patient Instructions (Signed)

## 2016-12-24 LAB — CERVICOVAGINAL ANCILLARY ONLY
BACTERIAL VAGINITIS: NEGATIVE
Candida vaginitis: NEGATIVE
Chlamydia: NEGATIVE
NEISSERIA GONORRHEA: NEGATIVE
Trichomonas: NEGATIVE

## 2017-01-05 ENCOUNTER — Encounter (HOSPITAL_COMMUNITY): Payer: Self-pay | Admitting: *Deleted

## 2017-01-05 ENCOUNTER — Inpatient Hospital Stay (HOSPITAL_COMMUNITY)
Admission: AD | Admit: 2017-01-05 | Discharge: 2017-01-05 | Disposition: A | Discharge status: Home / Self Care | Payer: Medicaid Other | Source: Ambulatory Visit | Attending: Obstetrics and Gynecology | Admitting: Obstetrics and Gynecology

## 2017-01-05 DIAGNOSIS — O99332 Smoking (tobacco) complicating pregnancy, second trimester: Secondary | ICD-10-CM | POA: Diagnosis not present

## 2017-01-05 DIAGNOSIS — Z3A23 23 weeks gestation of pregnancy: Secondary | ICD-10-CM | POA: Insufficient documentation

## 2017-01-05 DIAGNOSIS — O09299 Supervision of pregnancy with other poor reproductive or obstetric history, unspecified trimester: Secondary | ICD-10-CM

## 2017-01-05 DIAGNOSIS — O26892 Other specified pregnancy related conditions, second trimester: Secondary | ICD-10-CM | POA: Diagnosis not present

## 2017-01-05 DIAGNOSIS — F17290 Nicotine dependence, other tobacco product, uncomplicated: Secondary | ICD-10-CM | POA: Insufficient documentation

## 2017-01-05 DIAGNOSIS — G43019 Migraine without aura, intractable, without status migrainosus: Secondary | ICD-10-CM | POA: Insufficient documentation

## 2017-01-05 DIAGNOSIS — O9989 Other specified diseases and conditions complicating pregnancy, childbirth and the puerperium: Secondary | ICD-10-CM

## 2017-01-05 DIAGNOSIS — R8271 Bacteriuria: Secondary | ICD-10-CM

## 2017-01-05 DIAGNOSIS — G43909 Migraine, unspecified, not intractable, without status migrainosus: Secondary | ICD-10-CM | POA: Diagnosis present

## 2017-01-05 LAB — COMPREHENSIVE METABOLIC PANEL
ALBUMIN: 3.5 g/dL (ref 3.5–5.0)
ALT: 13 U/L — ABNORMAL LOW (ref 14–54)
AST: 17 U/L (ref 15–41)
Alkaline Phosphatase: 54 U/L (ref 38–126)
Anion gap: 7 (ref 5–15)
CO2: 20 mmol/L — ABNORMAL LOW (ref 22–32)
Calcium: 9.5 mg/dL (ref 8.9–10.3)
Chloride: 106 mmol/L (ref 101–111)
Creatinine, Ser: 0.44 mg/dL (ref 0.44–1.00)
GFR calc Af Amer: >60 mL/min (ref >60)
GFR calc non Af Amer: >60 mL/min (ref >60)
GLUCOSE: 79 mg/dL (ref 65–99)
POTASSIUM: 3.7 mmol/L (ref 3.5–5.1)
SODIUM: 133 mmol/L — AB (ref 135–145)
Total Bilirubin: 0.2 mg/dL — ABNORMAL LOW (ref 0.3–1.2)
Total Protein: 6.7 g/dL (ref 6.5–8.1)

## 2017-01-05 LAB — CBC
HCT: 32.8 % — ABNORMAL LOW (ref 36.0–46.0)
Hemoglobin: 11.2 g/dL — ABNORMAL LOW (ref 12.0–15.0)
MCH: 28.1 pg (ref 26.0–34.0)
MCHC: 34.1 g/dL (ref 30.0–36.0)
MCV: 82.4 fL (ref 78.0–100.0)
PLATELETS: 302 10*3/uL (ref 150–400)
RBC: 3.98 MIL/uL (ref 3.87–5.11)
RDW: 13.3 % (ref 11.5–15.5)
WBC: 11.9 10*3/uL — AB (ref 4.0–10.5)

## 2017-01-05 LAB — URINALYSIS, ROUTINE W REFLEX MICROSCOPIC
BILIRUBIN URINE: NEGATIVE
Glucose, UA: NEGATIVE mg/dL
HGB URINE DIPSTICK: NEGATIVE
KETONES UR: NEGATIVE mg/dL
Leukocytes, UA: NEGATIVE
Nitrite: NEGATIVE
PROTEIN: NEGATIVE mg/dL
Specific Gravity, Urine: 1.017 (ref 1.005–1.030)
pH: 5 (ref 5.0–8.0)

## 2017-01-05 LAB — PROTEIN / CREATININE RATIO, URINE
Creatinine, Urine: 208 mg/dL
Protein Creatinine Ratio: 0.04 mg/mg{Cre} (ref 0.00–0.15)
Total Protein, Urine: 8 mg/dL

## 2017-01-05 MED ORDER — DEXAMETHASONE SODIUM PHOSPHATE 10 MG/ML IJ SOLN
10.0000 mg | INTRAMUSCULAR | Status: AC
  Administered 2017-01-05: 10 mg via INTRAVENOUS
  Filled 2017-01-05: qty 1

## 2017-01-05 MED ORDER — CYCLOBENZAPRINE HCL 5 MG PO TABS: 5.0000 mg | ORAL_TABLET | Freq: Three times a day (TID) | ORAL | 0 refills | Status: DC | PRN

## 2017-01-05 MED ORDER — LACTATED RINGERS IV SOLN
INTRAVENOUS | Status: DC
  Filled 2017-01-05 (×9): qty 1000

## 2017-01-05 MED ORDER — LACTATED RINGERS IV SOLN
INTRAVENOUS | Status: DC
  Administered 2017-01-05: 18:00:00 via INTRAVENOUS

## 2017-01-05 MED ORDER — METOCLOPRAMIDE HCL 5 MG/ML IJ SOLN
10.0000 mg | INTRAMUSCULAR | Status: AC
  Administered 2017-01-05: 10 mg via INTRAVENOUS
  Filled 2017-01-05: qty 2

## 2017-01-05 MED ORDER — DIPHENHYDRAMINE HCL 50 MG/ML IJ SOLN
25.0000 mg | INTRAMUSCULAR | Status: AC
  Administered 2017-01-05: 25 mg via INTRAVENOUS
  Filled 2017-01-05: qty 1

## 2017-01-05 NOTE — MAU Note (Signed)
Pt stated she has had a migraine for 3 days, taking tylenol without much releif. Pain is now in her neck as well. Did have preeclampsia with last pregnancy and stated she had a real bad headache like this one when they told her she had it.

## 2017-01-05 NOTE — MAU Provider Note (Signed)
History     CSN: [id number]  Arrival date and time: 01/05/17 1724   First Provider Initiated Contact with Patient 01/05/17 1751      Chief Complaint  Patient presents with  . Headache  . Back Pain  . Neck Pain   HPI  [name] is 26 yo G5P1021 at 23.[redacted] wks gestation presenting to MAU with complaints of migraine headache, RT neck pain & soreness x 3 days.  She has had no relief with Tylenol.  She reports that she has good ROM in her neck, but it is very sore.  She states the RT side of her neck hurts more than the LT side.  She has a h/o PEC with a previous pregnancy and states that her "H/A feels like the H/A she had with PEC".  She is photophobic at this time. Denies N/V.  She reports good FM all day.  Past Medical History:  Diagnosis Date  . GERD (gastroesophageal reflux disease)   . 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 Migraine     Past Surgical History:  Procedure Laterality Date  . NO PAST SURGERIES      Family History  Problem Relation Age of Onset  . Hypertension Mother   . Asthma Brother   . Arthritis Maternal Grandmother   . Cancer Maternal Grandmother   . Arthritis Paternal Grandmother   . Anesthesia problems Neg Hx     Social History  Substance Use Topics  . Smoking status: Current Every Day Smoker    Packs/day: 0.25    Types: Cigars  . Smokeless tobacco: Never Used  . Alcohol use Yes     Comment: not with pregnancy    Allergies: No Known Allergies  Prescriptions Prior to Admission  Medication Sig Dispense Refill Last Dose  . acetaminophen (TYLENOL) 500 MG tablet Take 500 mg by mouth every 6 (six) hours as needed for headache.     Marland Kitchen amoxicillin-clavulanate (AUGMENTIN) 875-125 MG tablet Take 1 tablet by mouth 2 (two) times daily. 14 tablet 0 Not Taking  . Doxylamine-Pyridoxine (DICLEGIS) 10-10 MG TBEC 1 tab in AM, 1 tab mid afternoon 2 tabs at bedtime. Max dose 4 tabs daily. 100 tablet 5 Taking  . prenatal vitamin w/FE, FA (PRENATAL 1 +  1) 27-1 MG TABS tablet Take 1 tablet by mouth daily at 12 noon. 30 each 5 Taking  . valACYclovir (VALTREX) 1000 MG tablet Take 1 tablet (1,000 mg total) by mouth 2 (two) times daily as needed. Take for 3 days prn each outbreak. 30 tablet prn Taking    Review of Systems  Constitutional: Negative.   HENT: Negative.   Eyes: Negative.   Respiratory: Negative.   Cardiovascular: Negative.   Gastrointestinal: Negative.   Endocrine: Negative.   Genitourinary: Negative.   Musculoskeletal: Positive for neck pain.  Skin: Negative.   Allergic/Immunologic: Negative.   Neurological: Positive for headaches.  Hematological: Negative.   Psychiatric/Behavioral: Negative.    Physical Exam   Blood pressure (!) 85/50, pulse (!) 106, temperature 98.2 F (36.8 C), temperature source Oral, resp. rate 18, last menstrual period 07/26/2016.  Patient Vitals for the past 24 hrs:  BP Temp Temp src Pulse Resp  01/05/17 1900 (!) 85/50 - - (!) 106 -  01/05/17 1853 (!) 89/49 - - (!) 102 -  01/05/17 1839 (!) 87/48 - - (!) 101 -  01/05/17 1745 120/63 98.2 F (36.8 C) Oral (!) 112 18    Physical Exam  Constitutional: She is oriented to person, place, and time. She appears well-developed and well-nourished.  HENT:  Head: Normocephalic.  Eyes: Pupils are equal, round, and reactive to light.  Neck: Normal range of motion. Muscular tenderness (on RT side of neck) present.    Cardiovascular: Normal rate, regular rhythm and normal heart sounds.   Respiratory: Effort normal and breath sounds normal.  GI: Soft. Bowel sounds are normal.  Neurological: She is alert and oriented to person, place, and time. She has normal reflexes.  Skin: Skin is warm and dry.  Psychiatric: She has a normal mood and affect. Her behavior is normal. Judgment and thought content normal.    MAU Course  Procedures  MDM CCUA CBC CMP P/C ratio Serial BP's LR 1000 ml bolus -- H/A improved after IVFs and IVP meds / pt was able to  sleep and keep graham crackers down   *Decadron 10 mg slow IVP   *Benadryl 25 mg slow IVP   *Reglan 10 mg slow IVP NST - FHR: 155 bpm / moderate variability / accels present / decels absent TOCO: none  Results for orders placed or performed during the hospital encounter of 01/05/17 (from the past 24 hour(s))  Urinalysis, Routine w reflex microscopic     Status: Abnormal   Collection Time: 01/05/17  5:30 PM  Result Value Ref Range   Color, Urine YELLOW YELLOW   APPearance HAZY (A) CLEAR   Specific Gravity, Urine 1.017 1.005 - 1.030   pH 5.0 5.0 - 8.0   Glucose, UA NEGATIVE NEGATIVE mg/dL   Hgb urine dipstick NEGATIVE NEGATIVE   Bilirubin Urine NEGATIVE NEGATIVE   Ketones, ur NEGATIVE NEGATIVE mg/dL   Protein, ur NEGATIVE NEGATIVE mg/dL   Nitrite NEGATIVE NEGATIVE   Leukocytes, UA NEGATIVE NEGATIVE  Protein / creatinine ratio, urine     Status: None   Collection Time: 01/05/17  5:30 PM  Result Value Ref Range   Creatinine, Urine 208.00 mg/dL   Total Protein, Urine 8 mg/dL   Protein Creatinine Ratio 0.04 0.00 - 0.15 mg/mg[Cre]  CBC     Status: Abnormal   Collection Time: 01/05/17  5:54 PM  Result Value Ref Range   WBC 11.9 (H) 4.0 - 10.5 K/uL   RBC 3.98 3.87 - 5.11 MIL/uL   Hemoglobin 11.2 (L) 12.0 - 15.0 g/dL   HCT 09.832.8 (L) 11.936.0 - 14.746.0 %   MCV 82.4 78.0 - 100.0 fL   MCH 28.1 26.0 - 34.0 pg   MCHC 34.1 30.0 - 36.0 g/dL   RDW 82.913.3 56.211.5 - 13.015.5 %   Platelets 302 150 - 400 K/uL  Comprehensive metabolic panel     Status: Abnormal   Collection Time: 01/05/17  5:54 PM  Result Value Ref Range   Sodium 133 (L) 135 - 145 mmol/L   Potassium 3.7 3.5 - 5.1 mmol/L   Chloride 106 101 - 111 mmol/L   CO2 20 (L) 22 - 32 mmol/L   Glucose, Bld 79 65 - 99 mg/dL   BUN <5 (L) 6 - 20 mg/dL   Creatinine, Ser 8.650.44 0.44 - 1.00 mg/dL   Calcium 9.5 8.9 - 78.410.3 mg/dL   Total Protein 6.7 6.5 - 8.1 g/dL   Albumin 3.5 3.5 - 5.0 g/dL   AST 17 15 - 41 U/L   ALT 13 (L) 14 - 54 U/L   Alkaline  Phosphatase 54 38 - 126 U/L   Total Bilirubin 0.2 (L) 0.3 - 1.2 mg/dL   GFR calc non  Af Amer >60 >60 mL/min   GFR calc Af Amer >60 >60 mL/min   Anion gap 7 5 - 15    Assessment and Plan  Intractable migraine without aura and without status migrainosus - Rx for Flexeril 5 mg po every 8 hrs prn H/A - Stay well-hydrated - Instructions of migraine H/As given  History of pre-eclampsia in prior pregnancy, currently pregnant - Normal PIH labs --> no PEC dx at this time  Discharge home Patient verbalized an understanding of the plan of care and agrees.   Raelyn Mora, MSN, CNM 01/05/2017, 7:21 PM

## 2017-01-07 ENCOUNTER — Ambulatory Visit (INDEPENDENT_AMBULATORY_CARE_PROVIDER_SITE_OTHER): Payer: Medicaid Other | Admitting: Family Medicine

## 2017-01-07 VITALS — BP 117/63 | HR 96 | Wt 171.0 lb

## 2017-01-07 DIAGNOSIS — M9904 Segmental and somatic dysfunction of sacral region: Secondary | ICD-10-CM | POA: Diagnosis not present

## 2017-01-07 DIAGNOSIS — M9903 Segmental and somatic dysfunction of lumbar region: Secondary | ICD-10-CM | POA: Diagnosis not present

## 2017-01-07 DIAGNOSIS — M9905 Segmental and somatic dysfunction of pelvic region: Secondary | ICD-10-CM | POA: Diagnosis not present

## 2017-01-07 DIAGNOSIS — M549 Dorsalgia, unspecified: Secondary | ICD-10-CM | POA: Diagnosis not present

## 2017-01-07 DIAGNOSIS — O9989 Other specified diseases and conditions complicating pregnancy, childbirth and the puerperium: Secondary | ICD-10-CM

## 2017-01-07 DIAGNOSIS — O26892 Other specified pregnancy related conditions, second trimester: Secondary | ICD-10-CM | POA: Diagnosis not present

## 2017-01-07 DIAGNOSIS — Z3482 Encounter for supervision of other normal pregnancy, second trimester: Secondary | ICD-10-CM

## 2017-01-07 DIAGNOSIS — Z3480 Encounter for supervision of other normal pregnancy, unspecified trimester: Secondary | ICD-10-CM

## 2017-01-07 NOTE — Progress Notes (Signed)
   PRENATAL VISIT NOTE  Subjective:  Stacey SpatesDanasia [name] [name] is a 26 y.o. G5P1021 at 6273w4d being seen today for ongoing prenatal care.  She is currently monitored for the following issues for this low-risk pregnancy and has Smoker; Major depressive disorder, recurrent, severe without psychotic features (HCC); Supervision of normal pregnancy, antepartum; History of pre-eclampsia in prior pregnancy, currently pregnant; Herpes simplex vulvovaginitis; Nausea and vomiting during pregnancy prior to [redacted] weeks gestation; and Group B streptococcal bacteriuria on her problem list.  Patient reports backache, worse on Left side, some radiation down left buttock. Worse when laying on back. Worse with movement  Contractions: Not present. Vag. Bleeding: None.  Movement: Present. Denies leaking of fluid.   The following portions of the patient's history were reviewed and updated as appropriate: allergies, current medications, past family history, past medical history, past social history, past surgical history and problem list. Problem list updated.  Objective:   Vitals:   01/07/17 0936  BP: 117/63  Pulse: 96  Weight: 171 lb (77.6 kg)    Fetal Status: Fetal Heart Rate (bpm): 140   Movement: Present     General:  Alert, oriented and cooperative. Patient is in no acute distress.  Skin: Skin is warm and dry. No rash noted.   Cardiovascular: Normal heart rate noted  Respiratory: Normal respiratory effort, no problems with respiration noted  Abdomen: Soft, gravid, appropriate for gestational age. Pain/Pressure: Present     Pelvic:  Cervical exam deferred        MSK: Restriction, tenderness, tissue texture changes, and paraspinal spasm in the left and right lumbar spine  Neuro: Moves all four extremities with no focal neurological deficit  Extremities: Normal range of motion.  Edema: None  Mental Status: Normal mood and affect. Normal behavior. Normal judgment and thought content.   OSE: Head   Cervical     Thoracic   Rib   Lumbar L5 ESRL  Sacrum L/L torsion  Pelvis Right ant innom    Assessment and Plan:  Pregnancy: G5P1021 at 1373w4d  1. Supervision of other normal pregnancy, antepartum FHT normal  2. Back pain affecting pregnancy in second trimester 3. Somatic dysfunction of lumbar region 4. Somatic dysfunction of sacral region 5. Somatic dysfunction of pelvis region OMT done after patient permission. HVLA technique utilized. Patient tolerated procedure well. Start flexeril 5-10mg  TID Prn. Discussed no driving or being responsible for young children on this medication   Preterm labor symptoms and general obstetric precautions including but not limited to vaginal bleeding, contractions, leaking of fluid and fetal movement were reviewed in detail with the patient. Please refer to After Visit Summary for other counseling recommendations.  No Follow-up on file.  Stacey [name], Stacey Ridlon J, DO

## 2017-01-17 ENCOUNTER — Ambulatory Visit (INDEPENDENT_AMBULATORY_CARE_PROVIDER_SITE_OTHER): Payer: Medicaid Other | Admitting: Obstetrics and Gynecology

## 2017-01-17 VITALS — BP 100/59 | HR 114 | Wt 170.2 lb

## 2017-01-17 DIAGNOSIS — Z3482 Encounter for supervision of other normal pregnancy, second trimester: Secondary | ICD-10-CM

## 2017-01-17 DIAGNOSIS — R8271 Bacteriuria: Secondary | ICD-10-CM

## 2017-01-17 DIAGNOSIS — O09299 Supervision of pregnancy with other poor reproductive or obstetric history, unspecified trimester: Secondary | ICD-10-CM

## 2017-01-17 DIAGNOSIS — O09292 Supervision of pregnancy with other poor reproductive or obstetric history, second trimester: Secondary | ICD-10-CM

## 2017-01-17 DIAGNOSIS — Z3480 Encounter for supervision of other normal pregnancy, unspecified trimester: Secondary | ICD-10-CM

## 2017-01-17 DIAGNOSIS — F17200 Nicotine dependence, unspecified, uncomplicated: Secondary | ICD-10-CM

## 2017-01-17 MED ORDER — NICOTINE 14 MG/24HR TD PT24: 14.0000 mg | MEDICATED_PATCH | Freq: Every day | TRANSDERMAL | 0 refills | Status: DC

## 2017-01-17 MED ORDER — COMFORT FIT MATERNITY SUPP MED MISC: 0 refills | Status: DC

## 2017-01-17 NOTE — Progress Notes (Signed)
Pt c/o HA's and dizziness; no relief with Flexeril. Denies visual disturbances.

## 2017-01-17 NOTE — Patient Instructions (Signed)
Coping with Quitting Smoking Quitting smoking is a physical and mental challenge. You will face cravings, withdrawal symptoms, and temptation. Before quitting, work with your health care provider to make a plan that can help you cope. Preparation can help you quit and keep you from giving in. How can I cope with cravings? Cravings usually last for 5-10 minutes. If you get through it, the craving will pass. Consider taking the following actions to help you cope with cravings:  Keep your mouth busy: ? Chew sugar-free gum. ? Suck on hard candies or a straw. ? Brush your teeth.  Keep your hands and body busy: ? Immediately change to a different activity when you feel a craving. ? Squeeze or play with a ball. ? Do an activity or a hobby, like making bead jewelry, practicing needlepoint, or working with wood. ? Mix up your normal routine. ? Take a short exercise break. Go for a quick walk or run up and down stairs. ? Spend time in public places where smoking is not allowed.  Focus on doing something kind or helpful for someone else.  Call a friend or family member to talk during a craving.  Join a support group.  Call a quit line, such as 1-800-QUIT-NOW.  Talk with your health care provider about medicines that might help you cope with cravings and make quitting easier for you.  How can I deal with withdrawal symptoms? Your body may experience negative effects as it tries to get used to not having nicotine in the system. These effects are called withdrawal symptoms. They may include:  Feeling hungrier than normal.  Trouble concentrating.  Irritability.  Trouble sleeping.  Feeling depressed.  Restlessness and agitation.  Craving a cigarette.  To manage withdrawal symptoms:  Avoid places, people, and activities that trigger your cravings.  Remember why you want to quit.  Get plenty of sleep.  Avoid coffee and other caffeinated drinks. These may worsen some of your  symptoms.  How can I handle social situations? Social situations can be difficult when you are quitting smoking, especially in the first few weeks. To manage this, you can:  Avoid parties, bars, and other social situations where people might be smoking.  Avoid alcohol.  Leave right away if you have the urge to smoke.  Explain to your family and friends that you are quitting smoking. Ask for understanding and support.  Plan activities with friends or family where smoking is not an option.  What are some ways I can cope with stress? Wanting to smoke may cause stress, and stress can make you want to smoke. Find ways to manage your stress. Relaxation techniques can help. For example:  Breathe slowly and deeply, in through your nose and out through your mouth.  Listen to soothing, relaxing music.  Talk with a family member or friend about your stress.  Light a candle.  Soak in a bath or take a shower.  Think about a peaceful place.  What are some ways I can prevent weight gain? Be aware that many people gain weight after they quit smoking. However, not everyone does. To keep from gaining weight, have a plan in place before you quit and stick to the plan after you quit. Your plan should include:  Having healthy snacks. When you have a craving, it may help to: ? Eat plain popcorn, crunchy carrots, celery, or other cut vegetables. ? Chew sugar-free gum.  Changing how you eat: ? Eat small portion sizes at meals. ?   Eat 4-6 small meals throughout the day instead of 1-2 large meals a day. ? Be mindful when you eat. Do not watch television or do other things that might distract you as you eat.  Exercising regularly: ? Make time to exercise each day. If you do not have time for a long workout, do short bouts of exercise for 5-10 minutes several times a day. ? Do some form of strengthening exercise, like weight lifting, and some form of aerobic exercise, like running or  swimming.  Drinking plenty of water or other low-calorie or no-calorie drinks. Drink 6-8 glasses of water daily, or as much as instructed by your health care provider.  Summary  Quitting smoking is a physical and mental challenge. You will face cravings, withdrawal symptoms, and temptation to smoke again. Preparation can help you as you go through these challenges.  You can cope with cravings by keeping your mouth busy (such as by chewing gum), keeping your body and hands busy, and making calls to family, friends, or a helpline for people who want to quit smoking.  You can cope with withdrawal symptoms by avoiding places where people smoke, avoiding drinks with caffeine, and getting plenty of rest.  Ask your health care provider about the different ways to prevent weight gain, avoid stress, and handle social situations. This information is not intended to replace advice given to you by your health care provider. Make sure you discuss any questions you have with your health care provider. Document Released: 06/01/2016 Document Revised: 06/01/2016 Document Reviewed: 06/01/2016 Elsevier Interactive Patient Education  2018 [employer]  

## 2017-01-17 NOTE — Progress Notes (Signed)
   PRENATAL VISIT NOTE  Subjective:  Stacey SpatesDanasia J [name] is a 26 y.o. G5P1021 at 4339w0d being seen today for ongoing prenatal care.  She is currently monitored for the following issues for this low-risk pregnancy and has Smoker; Major depressive disorder, recurrent, severe without psychotic features (HCC); Supervision of normal pregnancy, antepartum; History of pre-eclampsia in prior pregnancy, currently pregnant; Herpes simplex vulvovaginitis; Nausea and vomiting during pregnancy prior to [redacted] weeks gestation; and Group B streptococcal bacteriuria on her problem list.  Patient reports some migraines and persistent sciatic nerve pain.  Contractions: Irregular. Vag. Bleeding: None.  Movement: Present. Denies leaking of fluid.   The following portions of the patient's history were reviewed and updated as appropriate: allergies, current medications, past family history, past medical history, past social history, past surgical history and problem list. Problem list updated.  Objective:   Vitals:   01/17/17 1308  BP: (!) 100/59  Pulse: (!) 114  Weight: 170 lb 3.2 oz (77.2 kg)    Fetal Status: Fetal Heart Rate (bpm): 152 Fundal Height: 24 cm Movement: Present     General:  Alert, oriented and cooperative. Patient is in no acute distress.  Skin: Skin is warm and dry. No rash noted.   Cardiovascular: Normal heart rate noted  Respiratory: Normal respiratory effort, no problems with respiration noted  Abdomen: Soft, gravid, appropriate for gestational age.  Pain/Pressure: Present     Pelvic: Cervical exam deferred        Extremities: Normal range of motion.  Edema: None  Mental Status:  Normal mood and affect. Normal behavior. Normal judgment and thought content.   Assessment and Plan:  Pregnancy: G5P1021 at 5539w0d  1. Supervision of other normal pregnancy, antepartum Patient is doing well without complaints Advised to stretch and to continue to stay physically active Rx maternity support belt  provided Third trimester labs and glucola at next visit  2. Group B streptococcal bacteriuria Will provide prophylaxis in labor  3. History of pre-eclampsia in prior pregnancy, currently pregnant Patient has not been taking ASA  4. Smoker Patient is interested in quitting Rx Nicotine patch provided Smoking cessation information also provided  Preterm labor symptoms and general obstetric precautions including but not limited to vaginal bleeding, contractions, leaking of fluid and fetal movement were reviewed in detail with the patient. Please refer to After Visit Summary for other counseling recommendations.  Return in about 3 weeks (around 02/07/2017) for ROB, 2 hr glucola next visit.   Catalina AntiguaPeggy Garyn Waguespack, MD

## 2017-01-21 ENCOUNTER — Ambulatory Visit (INDEPENDENT_AMBULATORY_CARE_PROVIDER_SITE_OTHER): Payer: Medicaid Other | Admitting: Family Medicine

## 2017-01-21 VITALS — BP 106/60 | HR 88 | Wt 170.0 lb

## 2017-01-21 DIAGNOSIS — M9905 Segmental and somatic dysfunction of pelvic region: Secondary | ICD-10-CM | POA: Diagnosis not present

## 2017-01-21 DIAGNOSIS — M9904 Segmental and somatic dysfunction of sacral region: Secondary | ICD-10-CM

## 2017-01-21 DIAGNOSIS — M549 Dorsalgia, unspecified: Secondary | ICD-10-CM | POA: Diagnosis not present

## 2017-01-21 DIAGNOSIS — M9903 Segmental and somatic dysfunction of lumbar region: Secondary | ICD-10-CM

## 2017-01-21 DIAGNOSIS — O26892 Other specified pregnancy related conditions, second trimester: Secondary | ICD-10-CM | POA: Diagnosis not present

## 2017-01-21 DIAGNOSIS — Z3482 Encounter for supervision of other normal pregnancy, second trimester: Secondary | ICD-10-CM

## 2017-01-21 DIAGNOSIS — M9902 Segmental and somatic dysfunction of thoracic region: Secondary | ICD-10-CM

## 2017-01-21 DIAGNOSIS — M9908 Segmental and somatic dysfunction of rib cage: Secondary | ICD-10-CM

## 2017-01-21 DIAGNOSIS — Z3480 Encounter for supervision of other normal pregnancy, unspecified trimester: Secondary | ICD-10-CM

## 2017-01-21 DIAGNOSIS — O9989 Other specified diseases and conditions complicating pregnancy, childbirth and the puerperium: Secondary | ICD-10-CM

## 2017-01-21 DIAGNOSIS — O99891 Other specified diseases and conditions complicating pregnancy: Secondary | ICD-10-CM

## 2017-01-21 NOTE — Progress Notes (Signed)
Pt states low back pain and some cramping. Pt states no relief with use of Flexeril.

## 2017-01-21 NOTE — Progress Notes (Signed)
   PRENATAL VISIT NOTE  Subjective:  Stacey SpatesDanasia J [name] is a 26 y.o. G5P1021 at 7367w4d being seen today for ongoing prenatal care.  She is currently monitored for the following issues for this low-risk pregnancy and has Smoker; Major depressive disorder, recurrent, severe without psychotic features (HCC); Supervision of normal pregnancy, antepartum; History of pre-eclampsia in prior pregnancy, currently pregnant; Herpes simplex vulvovaginitis; Nausea and vomiting during pregnancy prior to [redacted] weeks gestation; and Group B streptococcal bacteriuria on her problem list.  Patient reports continues to have left lower back pain. OMT was good for a few hours, then pain returned. Continues to have on left lower back/sacrum.  Contractions: Irritability. Vag. Bleeding: None.  Movement: Present. Denies leaking of fluid.   The following portions of the patient's history were reviewed and updated as appropriate: allergies, current medications, past family history, past medical history, past social history, past surgical history and problem list. Problem list updated.  Objective:   Vitals:   01/21/17 1315  BP: 106/60  Pulse: 88  Weight: 170 lb (77.1 kg)    Fetal Status: Fetal Heart Rate (bpm): 160   Movement: Present     General:  Alert, oriented and cooperative. Patient is in no acute distress.  Skin: Skin is warm and dry. No rash noted.   Cardiovascular: Normal heart rate noted  Respiratory: Normal respiratory effort, no problems with respiration noted  Abdomen: Soft, gravid, appropriate for gestational age. Pain/Pressure: Present     Pelvic:  Cervical exam deferred        MSK: Restriction, tenderness, tissue texture changes, and paraspinal spasm in the left lumbar spine  Neuro: Moves all four extremities with no focal neurological deficit  Extremities: Normal range of motion.     Mental Status: Normal mood and affect. Normal behavior. Normal judgment and thought content.   OSE: Head   Cervical     Thoracic T12 FSRL  Rib 11/12 inhaled on left  Lumbar L5 ESRL  Sacrum L/L torsion  Pelvis Right ant innom    Assessment and Plan:  Pregnancy: G5P1021 at 2167w4d  1. Supervision of other normal pregnancy, antepartum FHT normal  2. Back pain affecting pregnancy in second trimester 3. Somatic dysfunction of lumbar region 4. Somatic dysfunction of sacral region 5. Somatic dysfunction of pelvis region 6. Somatic dysfunction of thoracic region 7. Somatic dysfunction of rib OMT done after patient permission. HVLA technique utilized. Patient tolerated procedure well. Stretches taught and demonstrated. Wearing back brace for now.    Preterm labor symptoms and general obstetric precautions including but not limited to vaginal bleeding, contractions, leaking of fluid and fetal movement were reviewed in detail with the patient. Please refer to After Visit Summary for other counseling recommendations.  No Follow-up on file.  Levie HeritageStinson, Kathlen Sakurai J, DO

## 2017-02-04 ENCOUNTER — Ambulatory Visit (INDEPENDENT_AMBULATORY_CARE_PROVIDER_SITE_OTHER): Payer: Medicaid Other | Admitting: Family Medicine

## 2017-02-04 VITALS — BP 107/63 | HR 103 | Wt 172.0 lb

## 2017-02-04 DIAGNOSIS — M549 Dorsalgia, unspecified: Secondary | ICD-10-CM | POA: Diagnosis not present

## 2017-02-04 DIAGNOSIS — M9903 Segmental and somatic dysfunction of lumbar region: Secondary | ICD-10-CM

## 2017-02-04 DIAGNOSIS — O9989 Other specified diseases and conditions complicating pregnancy, childbirth and the puerperium: Secondary | ICD-10-CM

## 2017-02-04 DIAGNOSIS — M9905 Segmental and somatic dysfunction of pelvic region: Secondary | ICD-10-CM | POA: Diagnosis not present

## 2017-02-04 DIAGNOSIS — Z3480 Encounter for supervision of other normal pregnancy, unspecified trimester: Secondary | ICD-10-CM

## 2017-02-04 DIAGNOSIS — O99891 Other specified diseases and conditions complicating pregnancy: Secondary | ICD-10-CM

## 2017-02-04 DIAGNOSIS — M9908 Segmental and somatic dysfunction of rib cage: Secondary | ICD-10-CM | POA: Diagnosis not present

## 2017-02-04 DIAGNOSIS — O26892 Other specified pregnancy related conditions, second trimester: Secondary | ICD-10-CM | POA: Diagnosis not present

## 2017-02-04 DIAGNOSIS — Z3482 Encounter for supervision of other normal pregnancy, second trimester: Secondary | ICD-10-CM

## 2017-02-04 DIAGNOSIS — M9902 Segmental and somatic dysfunction of thoracic region: Secondary | ICD-10-CM | POA: Diagnosis not present

## 2017-02-04 DIAGNOSIS — M9904 Segmental and somatic dysfunction of sacral region: Secondary | ICD-10-CM | POA: Diagnosis not present

## 2017-02-04 NOTE — Progress Notes (Signed)
   PRENATAL VISIT NOTE  Subjective:  Stacey [name] is a 26 y.o. G5P1021 at [redacted]w[redacted]d being seen today for ongoing prenatal care.  She is currently monitored for the following issues for this low-risk pregnancy and has Smoker; Major depressive disorder, recurrent, severe without psychotic features (HCC); Supervision of normal pregnancy, antepartum; History of pre-eclampsia in prior pregnancy, currently pregnant; Herpes simplex vulvovaginitis; Nausea and vomiting during pregnancy prior to [redacted] weeks gestation; and Group B streptococcal bacteriuria on her problem list.  Patient reports backpain improved for a couple of days following OMT last visit, then started to return. Now, backpain is back to initial levels, although is able to move better..  Contractions: Irritability. Vag. Bleeding: None.  Movement: Present. Denies leaking of fluid.   The following portions of the patient's history were reviewed and updated as appropriate: allergies, current medications, past family history, past medical history, past social history, past surgical history and problem list. Problem list updated.  Objective:   Vitals:   02/04/17 1035  BP: 107/63  Pulse: (!) 103  Weight: 172 lb (78 kg)    Fetal Status:     Movement: Present     General:  Alert, oriented and cooperative. Patient is in no acute distress.  Skin: Skin is warm and dry. No rash noted.   Cardiovascular: Normal heart rate noted  Respiratory: Normal respiratory effort, no problems with respiration noted  Abdomen: Soft, gravid, appropriate for gestational age. Pain/Pressure: Present     Pelvic:  Cervical exam deferred        MSK: Restriction, tenderness, tissue texture changes, and paraspinal spasm in the lumbar spine  Neuro: Moves all four extremities with no focal neurological deficit  Extremities: Normal range of motion.  Edema: None  Mental Status: Normal mood and affect. Normal behavior. Normal judgment and thought content.   OSE: Head     Cervical   Thoracic T4 FSRL, T7 FSRR  Rib Rib 7 inhaled on right  Lumbar L5 ESRL  Sacrum L/L torsion  Pelvis Right ant innom    Assessment and Plan:  Pregnancy: G5P1021 at [redacted]w[redacted]d  1. Supervision of other normal pregnancy, antepartum Wants to continue with OB care in [place] 2. Back pain affecting pregnancy in second trimester 3. Somatic dysfunction of lumbar region 4. Somatic dysfunction of sacral region 5. Somatic dysfunction of pelvis region 6. Somatic dysfunction of thoracic region 7. Somatic dysfunction of rib OMT done after patient permission. HVLA technique utilized. Patient tolerated procedure well. Continue stretches   Preterm labor symptoms and general obstetric precautions including but not limited to vaginal bleeding, contractions, leaking of fluid and fetal movement were reviewed in detail with the patient. Please refer to After Visit Summary for other counseling recommendations.  No Follow-up on file.  Levie Heritage, DO

## 2017-02-06 ENCOUNTER — Encounter: Payer: Self-pay | Admitting: *Deleted

## 2017-02-07 ENCOUNTER — Other Ambulatory Visit: Payer: Medicaid Other

## 2017-02-07 ENCOUNTER — Ambulatory Visit (INDEPENDENT_AMBULATORY_CARE_PROVIDER_SITE_OTHER): Payer: Medicaid Other | Admitting: Obstetrics and Gynecology

## 2017-02-07 VITALS — BP 105/67 | HR 98 | Wt 172.0 lb

## 2017-02-07 DIAGNOSIS — R8271 Bacteriuria: Secondary | ICD-10-CM

## 2017-02-07 DIAGNOSIS — R519 Headache, unspecified: Secondary | ICD-10-CM | POA: Insufficient documentation

## 2017-02-07 DIAGNOSIS — R51 Headache: Secondary | ICD-10-CM

## 2017-02-07 DIAGNOSIS — O09293 Supervision of pregnancy with other poor reproductive or obstetric history, third trimester: Secondary | ICD-10-CM

## 2017-02-07 DIAGNOSIS — O09299 Supervision of pregnancy with other poor reproductive or obstetric history, unspecified trimester: Secondary | ICD-10-CM

## 2017-02-07 DIAGNOSIS — Z3483 Encounter for supervision of other normal pregnancy, third trimester: Secondary | ICD-10-CM

## 2017-02-07 DIAGNOSIS — O26893 Other specified pregnancy related conditions, third trimester: Secondary | ICD-10-CM

## 2017-02-07 DIAGNOSIS — Z23 Encounter for immunization: Secondary | ICD-10-CM | POA: Diagnosis not present

## 2017-02-07 DIAGNOSIS — O26899 Other specified pregnancy related conditions, unspecified trimester: Secondary | ICD-10-CM | POA: Insufficient documentation

## 2017-02-07 DIAGNOSIS — Z3480 Encounter for supervision of other normal pregnancy, unspecified trimester: Secondary | ICD-10-CM

## 2017-02-07 MED ORDER — ASPIRIN EC 81 MG PO TBEC: 81.0000 mg | DELAYED_RELEASE_TABLET | Freq: Every day | ORAL | 2 refills | Status: DC

## 2017-02-07 MED ORDER — BUTALBITAL-APAP-CAFFEINE 50-325-40 MG PO CAPS: 1.0000 | ORAL_CAPSULE | Freq: Four times a day (QID) | ORAL | 1 refills | Status: DC | PRN

## 2017-02-07 NOTE — Progress Notes (Signed)
Subjective:  Stacey [name] is a 26 y.o. G5P1021 at [redacted]w[redacted]d being seen today for ongoing prenatal care.  She is currently monitored for the following issues for this high-risk pregnancy and has Smoker; Major depressive disorder, recurrent, severe without psychotic features (HCC); Supervision of normal pregnancy, antepartum; History of pre-eclampsia in prior pregnancy, currently pregnant; Herpes simplex vulvovaginitis; Nausea and vomiting during pregnancy prior to [redacted] weeks gestation; Group B streptococcal bacteriuria; and Headache in pregnancy on her problem list.  Patient reports chronic migarine headaches. Has had throuhout pregnancy. OTC meds are not helping.  Contractions: Irregular. Vag. Bleeding: None.  Movement: Present. Denies leaking of fluid.   The following portions of the patient's history were reviewed and updated as appropriate: allergies, current medications, past family history, past medical history, past social history, past surgical history and problem list. Problem list updated.  Objective:   Vitals:   02/07/17 0906  BP: 105/67  Pulse: 98  Weight: 172 lb (78 kg)    Fetal Status: Fetal Heart Rate (bpm): 132   Movement: Present     General:  Alert, oriented and cooperative. Patient is in no acute distress.  Skin: Skin is warm and dry. No rash noted.   Cardiovascular: Normal heart rate noted  Respiratory: Normal respiratory effort, no problems with respiration noted  Abdomen: Soft, gravid, appropriate for gestational age. Pain/Pressure: Present     Pelvic:  Cervical exam deferred        Extremities: Normal range of motion.  Edema: None  Mental Status: Normal mood and affect. Normal behavior. Normal judgment and thought content.   Urinalysis:      Assessment and Plan:  Pregnancy: G5P1021 at [redacted]w[redacted]d  1. Supervision of other normal pregnancy, antepartum  - CBC - HIV antibody - RPR - Glucose Tolerance, 2 Hours w/1 Hour  2. Group B streptococcal bacteriuria Tx while in  labor  3. History of pre-eclampsia in prior pregnancy, currently pregnant Has not been taking BASA Indications for reviewed with pt - aspirin EC 81 MG tablet; Take 1 tablet (81 mg total) by mouth daily. Take after 12 weeks for prevention of preeclampsia later in pregnancy  Dispense: 300 tablet; Refill: 2  4. Pregnancy headache in third trimester  - Butalbital-APAP-Caffeine 50-325-40 MG capsule; Take 1-2 capsules by mouth every 6 (six) hours as needed for headache.  Dispense: 30 capsule; Refill: 1  Preterm labor symptoms and general obstetric precautions including but not limited to vaginal bleeding, contractions, leaking of fluid and fetal movement were reviewed in detail with the patient. Please refer to After Visit Summary for other counseling recommendations.  Return in about 2 weeks (around 02/21/2017) for OB visit.   Hermina Staggers, MD

## 2017-02-07 NOTE — Progress Notes (Signed)
Pt c/o HA's entire pregnancy. No relief with Tylenol nor Excedrin.

## 2017-02-07 NOTE — Patient Instructions (Signed)
Third Trimester of Pregnancy The third trimester is from week 28 through week 40 (months 7 through 9). The third trimester is a time when the unborn baby (fetus) is growing rapidly. At the end of the ninth month, the fetus is about 20 inches in length and weighs 6-10 pounds. Body changes during your third trimester Your body will continue to go through many changes during pregnancy. The changes vary from woman to woman. During the third trimester:  Your weight will continue to increase. You can expect to gain 25-35 pounds (11-16 kg) by the end of the pregnancy.  You may begin to get stretch marks on your hips, abdomen, and breasts.  You may urinate more often because the fetus is moving lower into your pelvis and pressing on your bladder.  You may develop or continue to have heartburn. This is caused by increased hormones that slow down muscles in the digestive tract.  You may develop or continue to have constipation because increased hormones slow digestion and cause the muscles that push waste through your intestines to relax.  You may develop hemorrhoids. These are swollen veins (varicose veins) in the rectum that can itch or be painful.  You may develop swollen, bulging veins (varicose veins) in your legs.  You may have increased body aches in the pelvis, back, or thighs. This is due to weight gain and increased hormones that are relaxing your joints.  You may have changes in your hair. These can include thickening of your hair, rapid growth, and changes in texture. Some women also have hair loss during or after pregnancy, or hair that feels dry or thin. Your hair will most likely return to normal after your baby is born.  Your breasts will continue to grow and they will continue to become tender. A yellow fluid (colostrum) may leak from your breasts. This is the first milk you are producing for your baby.  Your belly button may stick out.  You may notice more swelling in your hands,  face, or ankles.  You may have increased tingling or numbness in your hands, arms, and legs. The skin on your belly may also feel numb.  You may feel short of breath because of your expanding uterus.  You may have more problems sleeping. This can be caused by the size of your belly, increased need to urinate, and an increase in your body's metabolism.  You may notice the fetus "dropping," or moving lower in your abdomen (lightening).  You may have increased vaginal discharge.  You may notice your joints feel loose and you may have pain around your pelvic bone.  What to expect at prenatal visits You will have prenatal exams every 2 weeks until week 36. Then you will have weekly prenatal exams. During a routine prenatal visit:  You will be weighed to make sure you and the baby are growing normally.  Your blood pressure will be taken.  Your abdomen will be measured to track your baby's growth.  The fetal heartbeat will be listened to.  Any test results from the previous visit will be discussed.  You may have a cervical check near your due date to see if your cervix has softened or thinned (effaced).  You will be tested for Group B streptococcus. This happens between 35 and 37 weeks.  Your health care provider may ask you:  What your birth plan is.  How you are feeling.  If you are feeling the baby move.  If you have had   any abnormal symptoms, such as leaking fluid, bleeding, severe headaches, or abdominal cramping.  If you are using any tobacco products, including cigarettes, chewing tobacco, and electronic cigarettes.  If you have any questions.  Other tests or screenings that may be performed during your third trimester include:  Blood tests that check for low iron levels (anemia).  Fetal testing to check the health, activity level, and growth of the fetus. Testing is done if you have certain medical conditions or if there are problems during the  pregnancy.  Nonstress test (NST). This test checks the health of your baby to make sure there are no signs of problems, such as the baby not getting enough oxygen. During this test, a belt is placed around your belly. The baby is made to move, and its heart rate is monitored during movement.  What is false labor? False labor is a condition in which you feel small, irregular tightenings of the muscles in the womb (contractions) that usually go away with rest, changing position, or drinking water. These are called Braxton Hicks contractions. Contractions may last for hours, days, or even weeks before true labor sets in. If contractions come at regular intervals, become more frequent, increase in intensity, or become painful, you should see your health care provider. What are the signs of labor?  Abdominal cramps.  Regular contractions that start at 10 minutes apart and become stronger and more frequent with time.  Contractions that start on the top of the uterus and spread down to the lower abdomen and back.  Increased pelvic pressure and dull back pain.  A watery or bloody mucus discharge that comes from the vagina.  Leaking of amniotic fluid. This is also known as your "water breaking." It could be a slow trickle or a gush. Let your health care provider know if it has a color or strange odor. If you have any of these signs, call your health care provider right away, even if it is before your due date. Follow these instructions at home: Medicines  Follow your health care provider's instructions regarding medicine use. Specific medicines may be either safe or unsafe to take during pregnancy.  Take a prenatal vitamin that contains at least 600 micrograms (mcg) of folic acid.  If you develop constipation, try taking a stool softener if your health care provider approves. Eating and drinking  Eat a balanced diet that includes fresh fruits and vegetables, whole grains, good sources of protein  such as meat, eggs, or tofu, and low-fat dairy. Your health care provider will help you determine the amount of weight gain that is right for you.  Avoid raw meat and uncooked cheese. These carry germs that can cause birth defects in the baby.  If you have low calcium intake from food, talk to your health care provider about whether you should take a daily calcium supplement.  Eat four or five small meals rather than three large meals a day.  Limit foods that are high in fat and processed sugars, such as fried and sweet foods.  To prevent constipation: ? Drink enough fluid to keep your urine clear or pale yellow. ? Eat foods that are high in fiber, such as fresh fruits and vegetables, whole grains, and beans. Activity  Exercise only as directed by your health care provider. Most women can continue their usual exercise routine during pregnancy. Try to exercise for 30 minutes at least 5 days a week. Stop exercising if you experience uterine contractions.  Avoid heavy   lifting.  Do not exercise in extreme heat or humidity, or at high altitudes.  Wear low-heel, comfortable shoes.  Practice good posture.  You may continue to have sex unless your health care provider tells you otherwise. Relieving pain and discomfort  Take frequent breaks and rest with your legs elevated if you have leg cramps or low back pain.  Take warm sitz baths to soothe any pain or discomfort caused by hemorrhoids. Use hemorrhoid cream if your health care provider approves.  Wear a good support bra to prevent discomfort from breast tenderness.  If you develop varicose veins: ? Wear support pantyhose or compression stockings as told by your healthcare provider. ? Elevate your feet for 15 minutes, 3-4 times a day. Prenatal care  Write down your questions. Take them to your prenatal visits.  Keep all your prenatal visits as told by your health care provider. This is important. Safety  Wear your seat belt at  all times when driving.  Make a list of emergency phone numbers, including numbers for family, friends, the hospital, and police and fire departments. General instructions  Avoid cat litter boxes and soil used by cats. These carry germs that can cause birth defects in the baby. If you have a cat, ask someone to clean the litter box for you.  Do not travel far distances unless it is absolutely necessary and only with the approval of your health care provider.  Do not use hot tubs, steam rooms, or saunas.  Do not drink alcohol.  Do not use any products that contain nicotine or tobacco, such as cigarettes and e-cigarettes. If you need help quitting, ask your health care provider.  Do not use any medicinal herbs or unprescribed drugs. These chemicals affect the formation and growth of the baby.  Do not douche or use tampons or scented sanitary pads.  Do not cross your legs for long periods of time.  To prepare for the arrival of your baby: ? Take prenatal classes to understand, practice, and ask questions about labor and delivery. ? Make a trial run to the hospital. ? Visit the hospital and tour the maternity area. ? Arrange for maternity or paternity leave through employers. ? Arrange for family and friends to take care of pets while you are in the hospital. ? Purchase a rear-facing car seat and make sure you know how to install it in your car. ? Pack your hospital bag. ? Prepare the baby's nursery. Make sure to remove all pillows and stuffed animals from the baby's crib to prevent suffocation.  Visit your dentist if you have not gone during your pregnancy. Use a soft toothbrush to brush your teeth and be gentle when you floss. Contact a health care provider if:  You are unsure if you are in labor or if your water has broken.  You become dizzy.  You have mild pelvic cramps, pelvic pressure, or nagging pain in your abdominal area.  You have lower back pain.  You have persistent  nausea, vomiting, or diarrhea.  You have an unusual or bad smelling vaginal discharge.  You have pain when you urinate. Get help right away if:  Your water breaks before 37 weeks.  You have regular contractions less than 5 minutes apart before 37 weeks.  You have a fever.  You are leaking fluid from your vagina.  You have spotting or bleeding from your vagina.  You have severe abdominal pain or cramping.  You have rapid weight loss or weight gain.    You have shortness of breath with chest pain.  You notice sudden or extreme swelling of your face, hands, ankles, feet, or legs.  Your baby makes fewer than 10 movements in 2 hours.  You have severe headaches that do not go away when you take medicine.  You have vision changes. Summary  The third trimester is from week 28 through week 40, months 7 through 9. The third trimester is a time when the unborn baby (fetus) is growing rapidly.  During the third trimester, your discomfort may increase as you and your baby continue to gain weight. You may have abdominal, leg, and back pain, sleeping problems, and an increased need to urinate.  During the third trimester your breasts will keep growing and they will continue to become tender. A yellow fluid (colostrum) may leak from your breasts. This is the first milk you are producing for your baby.  False labor is a condition in which you feel small, irregular tightenings of the muscles in the womb (contractions) that eventually go away. These are called Braxton Hicks contractions. Contractions may last for hours, days, or even weeks before true labor sets in.  Signs of labor can include: abdominal cramps; regular contractions that start at 10 minutes apart and become stronger and more frequent with time; watery or bloody mucus discharge that comes from the vagina; increased pelvic pressure and dull back pain; and leaking of amniotic fluid. This information is not intended to replace advice  given to you by your health care provider. Make sure you discuss any questions you have with your health care provider. Document Released: 05/29/2001 Document Revised: 11/10/2015 Document Reviewed: 08/05/2012 Elsevier Interactive Patient Education  2017 [employer]  

## 2017-02-08 LAB — GLUCOSE TOLERANCE, 2 HOURS W/ 1HR
GLUCOSE, 1 HOUR: 129 mg/dL (ref 65–179)
GLUCOSE, FASTING: 82 mg/dL (ref 65–91)
Glucose, 2 hour: 115 mg/dL (ref 65–152)

## 2017-02-08 LAB — CBC
HEMATOCRIT: 32.2 % — AB (ref 34.0–46.6)
Hemoglobin: 10.6 g/dL — ABNORMAL LOW (ref 11.1–15.9)
MCH: 27.8 pg (ref 26.6–33.0)
MCHC: 32.9 g/dL (ref 31.5–35.7)
MCV: 85 fL (ref 79–97)
PLATELETS: 311 10*3/uL (ref 150–379)
RBC: 3.81 x10E6/uL (ref 3.77–5.28)
RDW: 13.8 % (ref 12.3–15.4)
WBC: 15.3 10*3/uL — AB (ref 3.4–10.8)

## 2017-02-08 LAB — RPR: RPR Ser Ql: NONREACTIVE

## 2017-02-08 LAB — HIV ANTIBODY (ROUTINE TESTING W REFLEX): HIV Screen 4th Generation wRfx: NONREACTIVE

## 2017-02-21 ENCOUNTER — Encounter: Payer: Self-pay | Admitting: Family Medicine

## 2017-02-25 ENCOUNTER — Ambulatory Visit (INDEPENDENT_AMBULATORY_CARE_PROVIDER_SITE_OTHER): Payer: Medicaid Other | Admitting: Obstetrics and Gynecology

## 2017-02-25 DIAGNOSIS — Z3480 Encounter for supervision of other normal pregnancy, unspecified trimester: Secondary | ICD-10-CM

## 2017-02-25 MED ORDER — FLUCONAZOLE 150 MG PO TABS: 150.0000 mg | ORAL_TABLET | Freq: Once | ORAL | 0 refills | Status: AC

## 2017-02-25 NOTE — Progress Notes (Addendum)
Patient complains of creamy white discharge and itching, no odor. Patient had cramping abdominal pains x2 days, 2 weeks ago and last Saturday.

## 2017-02-25 NOTE — Addendum Note (Signed)
Addended by: Maretta BeesMCGLASHAN, Dwon Sky J on: 02/25/2017 02:39 PM   Modules accepted: Orders

## 2017-02-25 NOTE — Progress Notes (Signed)
   PRENATAL VISIT NOTE  Subjective:  Stacey SpatesDanasia J [name] is a 26 y.o. G5P1021 at 515w4d being seen today for ongoing prenatal care.  She is currently monitored for the following issues for this high-risk pregnancy and has Smoker; Major depressive disorder, recurrent, severe without psychotic features (HCC); Supervision of normal pregnancy, antepartum; History of pre-eclampsia in prior pregnancy, currently pregnant; Herpes simplex vulvovaginitis; Nausea and vomiting during pregnancy prior to [redacted] weeks gestation; Group B streptococcal bacteriuria; and Headache in pregnancy on her problem list.  Patient reports pruritic white discharge.  Contractions: Irritability. Vag. Bleeding: None.  Movement: Present. Denies leaking of fluid.   The following portions of the patient's history were reviewed and updated as appropriate: allergies, current medications, past family history, past medical history, past social history, past surgical history and problem list. Problem list updated.  Objective:   Vitals:   02/25/17 1416  BP: 111/70  Pulse: (!) 1  Weight: 171 lb 3.2 oz (77.7 kg)    Fetal Status: Fetal Heart Rate (bpm): 138   Movement: Present     General:  Alert, oriented and cooperative. Patient is in no acute distress.  Skin: Skin is warm and dry. No rash noted.   Cardiovascular: Normal heart rate noted  Respiratory: Normal respiratory effort, no problems with respiration noted  Abdomen: Soft, gravid, appropriate for gestational age.  Pain/Pressure: Present     Pelvic: Cervical exam deferred        Extremities: Normal range of motion.  Edema: None  Mental Status:  Normal mood and affect. Normal behavior. Normal judgment and thought content.   Assessment and Plan:  Pregnancy: G5P1021 at 615w4d  1. Supervision of other normal pregnancy, antepartum Patient is doing well with suspicion for a yeast infection Rx Diflucan provided Flu vaccine today - Flu Vaccine QUAD 36+ mos IM (Fluarix, Quad  PF)  Preterm labor symptoms and general obstetric precautions including but not limited to vaginal bleeding, contractions, leaking of fluid and fetal movement were reviewed in detail with the patient. Please refer to After Visit Summary for other counseling recommendations.  Return in about 2 weeks (around 03/11/2017) for ROB.   Catalina AntiguaPeggy Teneisha Gignac, MD

## 2017-02-28 ENCOUNTER — Encounter: Payer: Self-pay | Admitting: Family Medicine

## 2017-03-11 ENCOUNTER — Ambulatory Visit (INDEPENDENT_AMBULATORY_CARE_PROVIDER_SITE_OTHER): Payer: Medicaid Other | Admitting: Obstetrics and Gynecology

## 2017-03-11 ENCOUNTER — Other Ambulatory Visit (HOSPITAL_COMMUNITY)
Admission: RE | Admit: 2017-03-11 | Discharge: 2017-03-11 | Disposition: A | Discharge status: Home / Self Care | Payer: Medicaid Other | Source: Ambulatory Visit | Attending: Obstetrics and Gynecology | Admitting: Obstetrics and Gynecology

## 2017-03-11 VITALS — BP 100/64 | HR 93 | Wt 174.0 lb

## 2017-03-11 DIAGNOSIS — Z3480 Encounter for supervision of other normal pregnancy, unspecified trimester: Secondary | ICD-10-CM

## 2017-03-11 DIAGNOSIS — O09299 Supervision of pregnancy with other poor reproductive or obstetric history, unspecified trimester: Secondary | ICD-10-CM

## 2017-03-11 DIAGNOSIS — Z3A32 32 weeks gestation of pregnancy: Secondary | ICD-10-CM | POA: Diagnosis not present

## 2017-03-11 DIAGNOSIS — N898 Other specified noninflammatory disorders of vagina: Secondary | ICD-10-CM

## 2017-03-11 DIAGNOSIS — O26893 Other specified pregnancy related conditions, third trimester: Secondary | ICD-10-CM | POA: Insufficient documentation

## 2017-03-11 DIAGNOSIS — O29293 Other central nervous system complications of anesthesia during pregnancy, third trimester: Secondary | ICD-10-CM

## 2017-03-11 DIAGNOSIS — R8271 Bacteriuria: Secondary | ICD-10-CM

## 2017-03-11 DIAGNOSIS — O26899 Other specified pregnancy related conditions, unspecified trimester: Secondary | ICD-10-CM | POA: Insufficient documentation

## 2017-03-11 NOTE — Progress Notes (Signed)
Subjective:  Stacey [name] is a 26 y.o. G5P1021 at [redacted]w[redacted]d being seen today for ongoing prenatal care.  She is currently monitored for the following issues for this high-risk pregnancy and has Smoker; Major depressive disorder, recurrent, severe without psychotic features (HCC); Supervision of normal pregnancy, antepartum; History of pre-eclampsia in prior pregnancy, currently pregnant; Herpes simplex vulvovaginitis; Nausea and vomiting during pregnancy prior to [redacted] weeks gestation; Group B streptococcal bacteriuria; Headache in pregnancy; and Vaginal discharge during pregnancy on her problem list.  Patient reports occasional contractions and vaginal discharge with odor. No LOF, No VB. No recent IC. No S/Sx of UTI.  Contractions: Irregular. Vag. Bleeding: None.  Movement: Present. Denies leaking of fluid.   The following portions of the patient's history were reviewed and updated as appropriate: allergies, current medications, past family history, past medical history, past social history, past surgical history and problem list. Problem list updated.  Objective:   Vitals:   03/11/17 1312  BP: 100/64  Pulse: 93  Weight: 174 lb (78.9 kg)    Fetal Status: Fetal Heart Rate (bpm): 135   Movement: Present     General:  Alert, oriented and cooperative. Patient is in no acute distress.  Skin: Skin is warm and dry. No rash noted.   Cardiovascular: Normal heart rate noted  Respiratory: Normal respiratory effort, no problems with respiration noted  Abdomen: Soft, gravid, appropriate for gestational age. Pain/Pressure: Present     Pelvic:  Cervical exam performed        Extremities: Normal range of motion.     Mental Status: Normal mood and affect. Normal behavior. Normal judgment and thought content.   Urinalysis:      Assessment and Plan:  Pregnancy: G5P1021 at [redacted]w[redacted]d  1. Supervision of other normal pregnancy, antepartum Samples of Gummie PNV given to pt. PNV makes N/V - Cervicovaginal  ancillary only  2. Group B streptococcal bacteriuria Tx while in labor  3. History of pre-eclampsia in prior pregnancy, currently pregnant BP satble Not taking BASA Indications for taking reviewed with pt  4. Vaginal discharge during pregnancy in third trimester  - Cervicovaginal ancillary only  Preterm labor symptoms and general obstetric precautions including but not limited to vaginal bleeding, contractions, leaking of fluid and fetal movement were reviewed in detail with the patient. Please refer to After Visit Summary for other counseling recommendations.  Return in about 2 weeks (around 03/25/2017) for OB visit.   Hermina Staggers, MD

## 2017-03-11 NOTE — Progress Notes (Signed)
Pt states she is having increase in pelvic pressure. Pt having irregular ctx. Pt complaints of having increase d/c with odor, would like check today.

## 2017-03-12 LAB — CERVICOVAGINAL ANCILLARY ONLY
BACTERIAL VAGINITIS: NEGATIVE
CANDIDA VAGINITIS: NEGATIVE

## 2017-03-25 ENCOUNTER — Ambulatory Visit (INDEPENDENT_AMBULATORY_CARE_PROVIDER_SITE_OTHER): Payer: Medicaid Other | Admitting: Obstetrics and Gynecology

## 2017-03-25 VITALS — BP 106/68 | HR 105 | Wt 173.0 lb

## 2017-03-25 DIAGNOSIS — Z23 Encounter for immunization: Secondary | ICD-10-CM

## 2017-03-25 DIAGNOSIS — Z3480 Encounter for supervision of other normal pregnancy, unspecified trimester: Secondary | ICD-10-CM

## 2017-03-25 DIAGNOSIS — O09299 Supervision of pregnancy with other poor reproductive or obstetric history, unspecified trimester: Secondary | ICD-10-CM

## 2017-03-25 DIAGNOSIS — O09293 Supervision of pregnancy with other poor reproductive or obstetric history, third trimester: Secondary | ICD-10-CM

## 2017-03-25 DIAGNOSIS — R8271 Bacteriuria: Secondary | ICD-10-CM

## 2017-03-25 DIAGNOSIS — Z3483 Encounter for supervision of other normal pregnancy, third trimester: Secondary | ICD-10-CM

## 2017-03-25 MED ORDER — VITAFOL GUMMIES 3.33-0.333-34.8 MG PO CHEW: 2.0000 | CHEWABLE_TABLET | Freq: Every day | ORAL | 6 refills | Status: AC

## 2017-03-25 NOTE — Progress Notes (Signed)
   PRENATAL VISIT NOTE  Subjective:  Stacey [name] is a 26 y.o. G5P1021 at [redacted]w[redacted]d being seen today for ongoing prenatal care.  She is currently monitored for the following issues for this low-risk pregnancy and has Smoker; Major depressive disorder, recurrent, severe without psychotic features (HCC); Supervision of normal pregnancy, antepartum; History of pre-eclampsia in prior pregnancy, currently pregnant; Herpes simplex vulvovaginitis; Nausea and vomiting during pregnancy prior to [redacted] weeks gestation; Group B streptococcal bacteriuria; Headache in pregnancy; and Vaginal discharge during pregnancy on her problem list.  Patient reports no complaints.  Contractions: Irregular. Vag. Bleeding: None.  Movement: Present. Denies leaking of fluid.   The following portions of the patient's history were reviewed and updated as appropriate: allergies, current medications, past family history, past medical history, past social history, past surgical history and problem list. Problem list updated.  Objective:   Vitals:   03/25/17 1351  BP: 106/68  Pulse: (!) 105  Weight: 173 lb (78.5 kg)    Fetal Status: Fetal Heart Rate (bpm): 136 Fundal Height: 34 cm Movement: Present     General:  Alert, oriented and cooperative. Patient is in no acute distress.  Skin: Skin is warm and dry. No rash noted.   Cardiovascular: Normal heart rate noted  Respiratory: Normal respiratory effort, no problems with respiration noted  Abdomen: Soft, gravid, appropriate for gestational age.  Pain/Pressure: Present     Pelvic: Cervical exam deferred        Extremities: Normal range of motion.  Edema: None  Mental Status:  Normal mood and affect. Normal behavior. Normal judgment and thought content.   Assessment and Plan:  Pregnancy: G5P1021 at [redacted]w[redacted]d  1. Supervision of other normal pregnancy, antepartum Patient is doing well without complaints She was hoping for another ultrasound to confirm gender. Discussed with the  patient that it was not medically indicated at this time Rx Prenatal gummies provided Patient with h/o genital HSV- advised to start prophylaxis - Flu Vaccine QUAD 36+ mos IM (Fluarix, Quad PF)  2. Group B streptococcal bacteriuria Will provide prophylaxis in labor  3. History of pre-eclampsia in prior pregnancy, currently pregnant Patient to start ASA when she picks up her PNV prescription Reviewed sign/sx  Preterm labor symptoms and general obstetric precautions including but not limited to vaginal bleeding, contractions, leaking of fluid and fetal movement were reviewed in detail with the patient. Please refer to After Visit Summary for other counseling recommendations.  Return in about 2 weeks (around 04/08/2017) for ROB.   Catalina Antigua, MD

## 2017-04-08 ENCOUNTER — Ambulatory Visit (INDEPENDENT_AMBULATORY_CARE_PROVIDER_SITE_OTHER): Payer: Medicaid Other | Admitting: Obstetrics and Gynecology

## 2017-04-08 ENCOUNTER — Other Ambulatory Visit (HOSPITAL_COMMUNITY)
Admission: RE | Admit: 2017-04-08 | Discharge: 2017-04-08 | Disposition: A | Discharge status: Home / Self Care | Payer: Medicaid Other | Source: Ambulatory Visit | Attending: Obstetrics and Gynecology | Admitting: Obstetrics and Gynecology

## 2017-04-08 VITALS — BP 125/70 | HR 108 | Wt 175.4 lb

## 2017-04-08 DIAGNOSIS — A6004 Herpesviral vulvovaginitis: Secondary | ICD-10-CM

## 2017-04-08 DIAGNOSIS — Z3480 Encounter for supervision of other normal pregnancy, unspecified trimester: Secondary | ICD-10-CM | POA: Insufficient documentation

## 2017-04-08 DIAGNOSIS — R8271 Bacteriuria: Secondary | ICD-10-CM

## 2017-04-08 DIAGNOSIS — N898 Other specified noninflammatory disorders of vagina: Secondary | ICD-10-CM

## 2017-04-08 DIAGNOSIS — O09299 Supervision of pregnancy with other poor reproductive or obstetric history, unspecified trimester: Secondary | ICD-10-CM

## 2017-04-08 NOTE — Patient Instructions (Signed)
Third Trimester of Pregnancy The third trimester is from week 28 through week 40 (months 7 through 9). The third trimester is a time when the unborn baby (fetus) is growing rapidly. At the end of the ninth month, the fetus is about 20 inches in length and weighs 6-10 pounds. Body changes during your third trimester Your body will continue to go through many changes during pregnancy. The changes vary from woman to woman. During the third trimester:  Your weight will continue to increase. You can expect to gain 25-35 pounds (11-16 kg) by the end of the pregnancy.  You may begin to get stretch marks on your hips, abdomen, and breasts.  You may urinate more often because the fetus is moving lower into your pelvis and pressing on your bladder.  You may develop or continue to have heartburn. This is caused by increased hormones that slow down muscles in the digestive tract.  You may develop or continue to have constipation because increased hormones slow digestion and cause the muscles that push waste through your intestines to relax.  You may develop hemorrhoids. These are swollen veins (varicose veins) in the rectum that can itch or be painful.  You may develop swollen, bulging veins (varicose veins) in your legs.  You may have increased body aches in the pelvis, back, or thighs. This is due to weight gain and increased hormones that are relaxing your joints.  You may have changes in your hair. These can include thickening of your hair, rapid growth, and changes in texture. Some women also have hair loss during or after pregnancy, or hair that feels dry or thin. Your hair will most likely return to normal after your baby is born.  Your breasts will continue to grow and they will continue to become tender. A yellow fluid (colostrum) may leak from your breasts. This is the first milk you are producing for your baby.  Your belly button may stick out.  You may notice more swelling in your hands,  face, or ankles.  You may have increased tingling or numbness in your hands, arms, and legs. The skin on your belly may also feel numb.  You may feel short of breath because of your expanding uterus.  You may have more problems sleeping. This can be caused by the size of your belly, increased need to urinate, and an increase in your body's metabolism.  You may notice the fetus "dropping," or moving lower in your abdomen (lightening).  You may have increased vaginal discharge.  You may notice your joints feel loose and you may have pain around your pelvic bone.  What to expect at prenatal visits You will have prenatal exams every 2 weeks until week 36. Then you will have weekly prenatal exams. During a routine prenatal visit:  You will be weighed to make sure you and the baby are growing normally.  Your blood pressure will be taken.  Your abdomen will be measured to track your baby's growth.  The fetal heartbeat will be listened to.  Any test results from the previous visit will be discussed.  You may have a cervical check near your due date to see if your cervix has softened or thinned (effaced).  You will be tested for Group B streptococcus. This happens between 35 and 37 weeks.  Your health care provider may ask you:  What your birth plan is.  How you are feeling.  If you are feeling the baby move.  If you have had   any abnormal symptoms, such as leaking fluid, bleeding, severe headaches, or abdominal cramping.  If you are using any tobacco products, including cigarettes, chewing tobacco, and electronic cigarettes.  If you have any questions.  Other tests or screenings that may be performed during your third trimester include:  Blood tests that check for low iron levels (anemia).  Fetal testing to check the health, activity level, and growth of the fetus. Testing is done if you have certain medical conditions or if there are problems during the  pregnancy.  Nonstress test (NST). This test checks the health of your baby to make sure there are no signs of problems, such as the baby not getting enough oxygen. During this test, a belt is placed around your belly. The baby is made to move, and its heart rate is monitored during movement.  What is false labor? False labor is a condition in which you feel small, irregular tightenings of the muscles in the womb (contractions) that usually go away with rest, changing position, or drinking water. These are called Braxton Hicks contractions. Contractions may last for hours, days, or even weeks before true labor sets in. If contractions come at regular intervals, become more frequent, increase in intensity, or become painful, you should see your health care provider. What are the signs of labor?  Abdominal cramps.  Regular contractions that start at 10 minutes apart and become stronger and more frequent with time.  Contractions that start on the top of the uterus and spread down to the lower abdomen and back.  Increased pelvic pressure and dull back pain.  A watery or bloody mucus discharge that comes from the vagina.  Leaking of amniotic fluid. This is also known as your "water breaking." It could be a slow trickle or a gush. Let your health care provider know if it has a color or strange odor. If you have any of these signs, call your health care provider right away, even if it is before your due date. Follow these instructions at home: Medicines  Follow your health care provider's instructions regarding medicine use. Specific medicines may be either safe or unsafe to take during pregnancy.  Take a prenatal vitamin that contains at least 600 micrograms (mcg) of folic acid.  If you develop constipation, try taking a stool softener if your health care provider approves. Eating and drinking  Eat a balanced diet that includes fresh fruits and vegetables, whole grains, good sources of protein  such as meat, eggs, or tofu, and low-fat dairy. Your health care provider will help you determine the amount of weight gain that is right for you.  Avoid raw meat and uncooked cheese. These carry germs that can cause birth defects in the baby.  If you have low calcium intake from food, talk to your health care provider about whether you should take a daily calcium supplement.  Eat four or five small meals rather than three large meals a day.  Limit foods that are high in fat and processed sugars, such as fried and sweet foods.  To prevent constipation: ? Drink enough fluid to keep your urine clear or pale yellow. ? Eat foods that are high in fiber, such as fresh fruits and vegetables, whole grains, and beans. Activity  Exercise only as directed by your health care provider. Most women can continue their usual exercise routine during pregnancy. Try to exercise for 30 minutes at least 5 days a week. Stop exercising if you experience uterine contractions.  Avoid heavy   lifting.  Do not exercise in extreme heat or humidity, or at high altitudes.  Wear low-heel, comfortable shoes.  Practice good posture.  You may continue to have sex unless your health care provider tells you otherwise. Relieving pain and discomfort  Take frequent breaks and rest with your legs elevated if you have leg cramps or low back pain.  Take warm sitz baths to soothe any pain or discomfort caused by hemorrhoids. Use hemorrhoid cream if your health care provider approves.  Wear a good support bra to prevent discomfort from breast tenderness.  If you develop varicose veins: ? Wear support pantyhose or compression stockings as told by your healthcare provider. ? Elevate your feet for 15 minutes, 3-4 times a day. Prenatal care  Write down your questions. Take them to your prenatal visits.  Keep all your prenatal visits as told by your health care provider. This is important. Safety  Wear your seat belt at  all times when driving.  Make a list of emergency phone numbers, including numbers for family, friends, the hospital, and police and fire departments. General instructions  Avoid cat litter boxes and soil used by cats. These carry germs that can cause birth defects in the baby. If you have a cat, ask someone to clean the litter box for you.  Do not travel far distances unless it is absolutely necessary and only with the approval of your health care provider.  Do not use hot tubs, steam rooms, or saunas.  Do not drink alcohol.  Do not use any products that contain nicotine or tobacco, such as cigarettes and e-cigarettes. If you need help quitting, ask your health care provider.  Do not use any medicinal herbs or unprescribed drugs. These chemicals affect the formation and growth of the baby.  Do not douche or use tampons or scented sanitary pads.  Do not cross your legs for long periods of time.  To prepare for the arrival of your baby: ? Take prenatal classes to understand, practice, and ask questions about labor and delivery. ? Make a trial run to the hospital. ? Visit the hospital and tour the maternity area. ? Arrange for maternity or paternity leave through employers. ? Arrange for family and friends to take care of pets while you are in the hospital. ? Purchase a rear-facing car seat and make sure you know how to install it in your car. ? Pack your hospital bag. ? Prepare the baby's nursery. Make sure to remove all pillows and stuffed animals from the baby's crib to prevent suffocation.  Visit your dentist if you have not gone during your pregnancy. Use a soft toothbrush to brush your teeth and be gentle when you floss. Contact a health care provider if:  You are unsure if you are in labor or if your water has broken.  You become dizzy.  You have mild pelvic cramps, pelvic pressure, or nagging pain in your abdominal area.  You have lower back pain.  You have persistent  nausea, vomiting, or diarrhea.  You have an unusual or bad smelling vaginal discharge.  You have pain when you urinate. Get help right away if:  Your water breaks before 37 weeks.  You have regular contractions less than 5 minutes apart before 37 weeks.  You have a fever.  You are leaking fluid from your vagina.  You have spotting or bleeding from your vagina.  You have severe abdominal pain or cramping.  You have rapid weight loss or weight gain.    You have shortness of breath with chest pain.  You notice sudden or extreme swelling of your face, hands, ankles, feet, or legs.  Your baby makes fewer than 10 movements in 2 hours.  You have severe headaches that do not go away when you take medicine.  You have vision changes. Summary  The third trimester is from week 28 through week 40, months 7 through 9. The third trimester is a time when the unborn baby (fetus) is growing rapidly.  During the third trimester, your discomfort may increase as you and your baby continue to gain weight. You may have abdominal, leg, and back pain, sleeping problems, and an increased need to urinate.  During the third trimester your breasts will keep growing and they will continue to become tender. A yellow fluid (colostrum) may leak from your breasts. This is the first milk you are producing for your baby.  False labor is a condition in which you feel small, irregular tightenings of the muscles in the womb (contractions) that eventually go away. These are called Braxton Hicks contractions. Contractions may last for hours, days, or even weeks before true labor sets in.  Signs of labor can include: abdominal cramps; regular contractions that start at 10 minutes apart and become stronger and more frequent with time; watery or bloody mucus discharge that comes from the vagina; increased pelvic pressure and dull back pain; and leaking of amniotic fluid. This information is not intended to replace advice  given to you by your health care provider. Make sure you discuss any questions you have with your health care provider. Document Released: 05/29/2001 Document Revised: 11/10/2015 Document Reviewed: 08/05/2012 Elsevier Interactive Patient Education  2017 [employer]  

## 2017-04-08 NOTE — Progress Notes (Signed)
ROB/GBS.  Having watery discharge, denies odor, itching or burning. Complains of rash on her back.

## 2017-04-08 NOTE — Progress Notes (Signed)
   PRENATAL VISIT NOTE  Subjective:  Stacey SpatesDanasia J [name] is a 26 y.o. G5P1021 at 6737w4d being seen today for ongoing prenatal care.  She is currently monitored for the following issues for this high-risk pregnancy and has Smoker; Major depressive disorder, recurrent, severe without psychotic features (HCC); Supervision of normal pregnancy, antepartum; History of pre-eclampsia in prior pregnancy, currently pregnant; Herpes simplex vulvovaginitis; Nausea and vomiting during pregnancy prior to [redacted] weeks gestation; Group B streptococcal bacteriuria; Headache in pregnancy; and Vaginal discharge during pregnancy on her problem list.  Patient reports occasional contractions and copious vaginal discharge, similar to what she has had but mroe recently.  Contractions: Irritability. Vag. Bleeding: None.  Movement: Present. Denies leaking of fluid.   The following portions of the patient's history were reviewed and updated as appropriate: allergies, current medications, past family history, past medical history, past social history, past surgical history and problem list. Problem list updated.  Objective:   Vitals:   04/08/17 1430  BP: 125/70  Pulse: (!) 108  Weight: 175 lb 6.4 oz (79.6 kg)    Fetal Status:     Movement: Present     General:  Alert, oriented and cooperative. Patient is in no acute distress.  Skin: Skin is warm and dry. No rash noted.   Cardiovascular: Normal heart rate noted  Respiratory: Normal respiratory effort, no problems with respiration noted  Abdomen: Soft, gravid, appropriate for gestational age.  Pain/Pressure: Present     Pelvic: SSE: no pooling, negative ferning, negative nitrazine  Extremities: Normal range of motion.  Edema: None  Mental Status:  Normal mood and affect. Normal behavior. Normal judgment and thought content.   Assessment and Plan:  Pregnancy: G5P1021 at 6337w4d  1. Herpes simplex vulvovaginitis On valtrex, denies symptoms/lesions  2. Supervision of other  normal pregnancy, antepartum GCCT sent today  3. History of pre-eclampsia in prior pregnancy, currently pregnant On baby ASA, to dc this week  4. Group B streptococcal bacteriuria ppx in labor  5. Vaginal discharge - no ROM - sent swab  Preterm labor symptoms and general obstetric precautions including but not limited to vaginal bleeding, contractions, leaking of fluid and fetal movement were reviewed in detail with the patient. Please refer to After Visit Summary for other counseling recommendations.  Return in about 1 week (around 04/15/2017) for OB visit.   Conan BowensKelly M Davis, MD

## 2017-04-09 LAB — CERVICOVAGINAL ANCILLARY ONLY
Bacterial vaginitis: NEGATIVE
CANDIDA VAGINITIS: NEGATIVE
CHLAMYDIA, DNA PROBE: NEGATIVE
NEISSERIA GONORRHEA: NEGATIVE
TRICH (WINDOWPATH): NEGATIVE

## 2017-04-17 ENCOUNTER — Encounter: Payer: Self-pay | Admitting: Obstetrics and Gynecology

## 2017-04-18 ENCOUNTER — Other Ambulatory Visit: Payer: Medicaid Other

## 2017-04-18 ENCOUNTER — Ambulatory Visit (INDEPENDENT_AMBULATORY_CARE_PROVIDER_SITE_OTHER): Payer: Medicaid Other | Admitting: Obstetrics and Gynecology

## 2017-04-18 VITALS — BP 102/69 | HR 105 | Wt 175.5 lb

## 2017-04-18 DIAGNOSIS — R8271 Bacteriuria: Secondary | ICD-10-CM

## 2017-04-18 DIAGNOSIS — Z3689 Encounter for other specified antenatal screening: Secondary | ICD-10-CM

## 2017-04-18 DIAGNOSIS — O219 Vomiting of pregnancy, unspecified: Secondary | ICD-10-CM

## 2017-04-18 DIAGNOSIS — O09299 Supervision of pregnancy with other poor reproductive or obstetric history, unspecified trimester: Secondary | ICD-10-CM

## 2017-04-18 DIAGNOSIS — A6004 Herpesviral vulvovaginitis: Secondary | ICD-10-CM

## 2017-04-18 DIAGNOSIS — Z3480 Encounter for supervision of other normal pregnancy, unspecified trimester: Secondary | ICD-10-CM

## 2017-04-18 NOTE — Progress Notes (Signed)
Pt c/o stuffy nose and chest congestion x 2 weeks. No relief with Mucinex, Benadryl, Vicks. Pt requests cx check today.

## 2017-04-18 NOTE — Progress Notes (Signed)
   PRENATAL VISIT NOTE  Subjective:  Stacey SpatesDanasia J [name] is a 26 y.o. G5P1021 at 6173w0d being seen today for ongoing prenatal care.  She is currently monitored for the following issues for this high-risk pregnancy and has Smoker; Major depressive disorder, recurrent, severe without psychotic features (HCC); Supervision of normal pregnancy, antepartum; History of pre-eclampsia in prior pregnancy, currently pregnant; Herpes simplex vulvovaginitis; Nausea and vomiting during pregnancy prior to [redacted] weeks gestation; Group B streptococcal bacteriuria; Headache in pregnancy; and Vaginal discharge during pregnancy on her problem list.  Patient reports contractions since 2 am, as well as a cough and congestion the last two weeks, has been using mucinex with some good effect.  Contractions: Irregular. Vag. Bleeding: None.  Movement: Present. Denies leaking of fluid.   The following portions of the patient's history were reviewed and updated as appropriate: allergies, current medications, past family history, past medical history, past social history, past surgical history and problem list. Problem list updated.  Objective:   Vitals:   04/18/17 0902  BP: 102/69  Pulse: (!) 105  Weight: 175 lb 8 oz (79.6 kg)    Fetal Status: Fetal Heart Rate (bpm): 139   Movement: Present     General:  Alert, oriented and cooperative. Patient is in no acute distress.  appears well, no pain with palpation over sinuses, no cervical lymphadenopathy  Skin: Skin is warm and dry. No rash noted.   Cardiovascular: Normal heart rate noted  Respiratory: Normal respiratory effort, no problems with respiration noted  Abdomen: Soft, gravid, appropriate for gestational age.  Pain/Pressure: Present cephalic by palpation    Pelvic: Cervical exam deferred        Extremities: Normal range of motion.  Edema: None  Mental Status:  Normal mood and affect. Normal behavior. Normal judgment and thought content.   Assessment and Plan:    Pregnancy: G5P1021 at 6373w0d  1. Supervision of other normal pregnancy, antepartum Cephalic by US  2. Group B streptococcal bacteriuria ppx in labor  3. History of pre-eclampsia in prior pregnancy, currently pregnant BP normal S/p baby ASA  4. Herpes simplex vulvovaginitis On valtrex No symptoms  5. Cough/congestion - no s/s sinus infection - likely lingering cold -reviewed to continue mucinex and PO hydration, use sterile saline if irrigating nose  Term labor symptoms and general obstetric precautions including but not limited to vaginal bleeding, contractions, leaking of fluid and fetal movement were reviewed in detail with the patient. Please refer to After Visit Summary for other counseling recommendations.  Return in about 1 week (around 04/25/2017) for OB visit.   Conan BowensKelly M Davis, MD

## 2017-04-18 NOTE — Patient Instructions (Signed)
For colds and allergies  Any anti-histamine including benadryl, allegra, claritin, etc.  Sudafed but not phenylephrine  Mucinex  Robitussin  For Reflux/heartburn  Pepcid Zantac Tums Prilosec Prevacid  For yeast infections  Monistat  For constipation  Colace  For minor aches and pains  Tylenol-do not take more than 4000mg  in 24 hours. Therma-care or like heat packs   Third Trimester of Pregnancy The third trimester is from week 28 through week 40 (months 7 through 9). The third trimester is a time when the unborn baby (fetus) is growing rapidly. At the end of the ninth month, the fetus is about 20 inches in length and weighs 6-10 pounds. Body changes during your third trimester Your body will continue to go through many changes during pregnancy. The changes vary from woman to woman. During the third trimester:  Your weight will continue to increase. You can expect to gain 25-35 pounds (11-16 kg) by the end of the pregnancy.  You may begin to get stretch marks on your hips, abdomen, and breasts.  You may urinate more often because the fetus is moving lower into your pelvis and pressing on your bladder.  You may develop or continue to have heartburn. This is caused by increased hormones that slow down muscles in the digestive tract.  You may develop or continue to have constipation because increased hormones slow digestion and cause the muscles that push waste through your intestines to relax.  You may develop hemorrhoids. These are swollen veins (varicose veins) in the rectum that can itch or be painful.  You may develop swollen, bulging veins (varicose veins) in your legs.  You may have increased body aches in the pelvis, back, or thighs. This is due to weight gain and increased hormones that are relaxing your joints.  You may have changes in your hair. These can include thickening of your hair, rapid growth, and changes in texture. Some women also have hair loss  during or after pregnancy, or hair that feels dry or thin. Your hair will most likely return to normal after your baby is born.  Your breasts will continue to grow and they will continue to become tender. A yellow fluid (colostrum) may leak from your breasts. This is the first milk you are producing for your baby.  Your belly button may stick out.  You may notice more swelling in your hands, face, or ankles.  You may have increased tingling or numbness in your hands, arms, and legs. The skin on your belly may also feel numb.  You may feel short of breath because of your expanding uterus.  You may have more problems sleeping. This can be caused by the size of your belly, increased need to urinate, and an increase in your body's metabolism.  You may notice the fetus "dropping," or moving lower in your abdomen (lightening).  You may have increased vaginal discharge.  You may notice your joints feel loose and you may have pain around your pelvic bone.  What to expect at prenatal visits You will have prenatal exams every 2 weeks until week 36. Then you will have weekly prenatal exams. During a routine prenatal visit:  You will be weighed to make sure you and the baby are growing normally.  Your blood pressure will be taken.  Your abdomen will be measured to track your baby's growth.  The fetal heartbeat will be listened to.  Any test results from the previous visit will be discussed.  You may have a  cervical check near your due date to see if your cervix has softened or thinned (effaced).  You will be tested for Group B streptococcus. This happens between 35 and 37 weeks.  Your health care provider may ask you:  What your birth plan is.  How you are feeling.  If you are feeling the baby move.  If you have had any abnormal symptoms, such as leaking fluid, bleeding, severe headaches, or abdominal cramping.  If you are using any tobacco products, including cigarettes, chewing  tobacco, and electronic cigarettes.  If you have any questions.  Other tests or screenings that may be performed during your third trimester include:  Blood tests that check for low iron levels (anemia).  Fetal testing to check the health, activity level, and growth of the fetus. Testing is done if you have certain medical conditions or if there are problems during the pregnancy.  Nonstress test (NST). This test checks the health of your baby to make sure there are no signs of problems, such as the baby not getting enough oxygen. During this test, a belt is placed around your belly. The baby is made to move, and its heart rate is monitored during movement.  What is false labor? False labor is a condition in which you feel small, irregular tightenings of the muscles in the womb (contractions) that usually go away with rest, changing position, or drinking water. These are called Braxton Hicks contractions. Contractions may last for hours, days, or even weeks before true labor sets in. If contractions come at regular intervals, become more frequent, increase in intensity, or become painful, you should see your health care provider. What are the signs of labor?  Abdominal cramps.  Regular contractions that start at 10 minutes apart and become stronger and more frequent with time.  Contractions that start on the top of the uterus and spread down to the lower abdomen and back.  Increased pelvic pressure and dull back pain.  A watery or bloody mucus discharge that comes from the vagina.  Leaking of amniotic fluid. This is also known as your "water breaking." It could be a slow trickle or a gush. Let your health care provider know if it has a color or strange odor. If you have any of these signs, call your health care provider right away, even if it is before your due date. Follow these instructions at home: Medicines  Follow your health care provider's instructions regarding medicine use.  Specific medicines may be either safe or unsafe to take during pregnancy.  Take a prenatal vitamin that contains at least 600 micrograms (mcg) of folic acid.  If you develop constipation, try taking a stool softener if your health care provider approves. Eating and drinking  Eat a balanced diet that includes fresh fruits and vegetables, whole grains, good sources of protein such as meat, eggs, or tofu, and low-fat dairy. Your health care provider will help you determine the amount of weight gain that is right for you.  Avoid raw meat and uncooked cheese. These carry germs that can cause birth defects in the baby.  If you have low calcium intake from food, talk to your health care provider about whether you should take a daily calcium supplement.  Eat four or five small meals rather than three large meals a day.  Limit foods that are high in fat and processed sugars, such as fried and sweet foods.  To prevent constipation: ? Drink enough fluid to keep your urine clear  or pale yellow. ? Eat foods that are high in fiber, such as fresh fruits and vegetables, whole grains, and beans. Activity  Exercise only as directed by your health care provider. Most women can continue their usual exercise routine during pregnancy. Try to exercise for 30 minutes at least 5 days a week. Stop exercising if you experience uterine contractions.  Avoid heavy lifting.  Do not exercise in extreme heat or humidity, or at high altitudes.  Wear low-heel, comfortable shoes.  Practice good posture.  You may continue to have sex unless your health care provider tells you otherwise. Relieving pain and discomfort  Take frequent breaks and rest with your legs elevated if you have leg cramps or low back pain.  Take warm sitz baths to soothe any pain or discomfort caused by hemorrhoids. Use hemorrhoid cream if your health care provider approves.  Wear a good support bra to prevent discomfort from breast  tenderness.  If you develop varicose veins: ? Wear support pantyhose or compression stockings as told by your healthcare provider. ? Elevate your feet for 15 minutes, 3-4 times a day. Prenatal care  Write down your questions. Take them to your prenatal visits.  Keep all your prenatal visits as told by your health care provider. This is important. Safety  Wear your seat belt at all times when driving.  Make a list of emergency phone numbers, including numbers for family, friends, the hospital, and police and fire departments. General instructions  Avoid cat litter boxes and soil used by cats. These carry germs that can cause birth defects in the baby. If you have a cat, ask someone to clean the litter box for you.  Do not travel far distances unless it is absolutely necessary and only with the approval of your health care provider.  Do not use hot tubs, steam rooms, or saunas.  Do not drink alcohol.  Do not use any products that contain nicotine or tobacco, such as cigarettes and e-cigarettes. If you need help quitting, ask your health care provider.  Do not use any medicinal herbs or unprescribed drugs. These chemicals affect the formation and growth of the baby.  Do not douche or use tampons or scented sanitary pads.  Do not cross your legs for long periods of time.  To prepare for the arrival of your baby: ? Take prenatal classes to understand, practice, and ask questions about labor and delivery. ? Make a trial run to the hospital. ? Visit the hospital and tour the maternity area. ? Arrange for maternity or paternity leave through employers. ? Arrange for family and friends to take care of pets while you are in the hospital. ? Purchase a rear-facing car seat and make sure you know how to install it in your car. ? Pack your hospital bag. ? Prepare the baby's nursery. Make sure to remove all pillows and stuffed animals from the baby's crib to prevent suffocation.  Visit  your dentist if you have not gone during your pregnancy. Use a soft toothbrush to brush your teeth and be gentle when you floss. Contact a health care provider if:  You are unsure if you are in labor or if your water has broken.  You become dizzy.  You have mild pelvic cramps, pelvic pressure, or nagging pain in your abdominal area.  You have lower back pain.  You have persistent nausea, vomiting, or diarrhea.  You have an unusual or bad smelling vaginal discharge.  You have pain when you urinate.  Get help right away if:  Your water breaks before 37 weeks.  You have regular contractions less than 5 minutes apart before 37 weeks.  You have a fever.  You are leaking fluid from your vagina.  You have spotting or bleeding from your vagina.  You have severe abdominal pain or cramping.  You have rapid weight loss or weight gain.  You have shortness of breath with chest pain.  You notice sudden or extreme swelling of your face, hands, ankles, feet, or legs.  Your baby makes fewer than 10 movements in 2 hours.  You have severe headaches that do not go away when you take medicine.  You have vision changes. Summary  The third trimester is from week 28 through week 40, months 7 through 9. The third trimester is a time when the unborn baby (fetus) is growing rapidly.  During the third trimester, your discomfort may increase as you and your baby continue to gain weight. You may have abdominal, leg, and back pain, sleeping problems, and an increased need to urinate.  During the third trimester your breasts will keep growing and they will continue to become tender. A yellow fluid (colostrum) may leak from your breasts. This is the first milk you are producing for your baby.  False labor is a condition in which you feel small, irregular tightenings of the muscles in the womb (contractions) that eventually go away. These are called Braxton Hicks contractions. Contractions may last for  hours, days, or even weeks before true labor sets in.  Signs of labor can include: abdominal cramps; regular contractions that start at 10 minutes apart and become stronger and more frequent with time; watery or bloody mucus discharge that comes from the vagina; increased pelvic pressure and dull back pain; and leaking of amniotic fluid. This information is not intended to replace advice given to you by your health care provider. Make sure you discuss any questions you have with your health care provider. Document Released: 05/29/2001 Document Revised: 11/10/2015 Document Reviewed: 08/05/2012 Elsevier Interactive Patient Education  2017 [employer].

## 2017-04-22 ENCOUNTER — Encounter: Payer: Self-pay | Admitting: Obstetrics and Gynecology

## 2017-04-25 ENCOUNTER — Ambulatory Visit (INDEPENDENT_AMBULATORY_CARE_PROVIDER_SITE_OTHER): Payer: Medicaid Other | Admitting: Obstetrics and Gynecology

## 2017-04-25 ENCOUNTER — Encounter: Payer: Self-pay | Admitting: Obstetrics and Gynecology

## 2017-04-25 VITALS — BP 113/75 | HR 109 | Wt 175.8 lb

## 2017-04-25 DIAGNOSIS — A6004 Herpesviral vulvovaginitis: Secondary | ICD-10-CM

## 2017-04-25 DIAGNOSIS — R8271 Bacteriuria: Secondary | ICD-10-CM

## 2017-04-25 DIAGNOSIS — Z3480 Encounter for supervision of other normal pregnancy, unspecified trimester: Secondary | ICD-10-CM

## 2017-04-25 NOTE — Progress Notes (Signed)
   PRENATAL VISIT NOTE  Subjective:  Stacey [name] is a 26 y.o. G5P1021 at 7510w0d being seen today for ongoing prenatal care.  She is currently monitored for the following issues for this low-risk pregnancy and has Smoker; Major depressive disorder, recurrent, severe without psychotic features (HCC); Supervision of normal pregnancy, antepartum; History of pre-eclampsia in prior pregnancy, currently pregnant; Herpes simplex vulvovaginitis; Nausea and vomiting during pregnancy prior to [redacted] weeks gestation; Group B streptococcal bacteriuria; Headache in pregnancy; and Vaginal discharge during pregnancy on their problem list.  Patient reports no complaints.  Contractions: Irregular. Vag. Bleeding: None.  Movement: Present. Denies leaking of fluid.   The following portions of the patient's history were reviewed and updated as appropriate: allergies, current medications, past family history, past medical history, past social history, past surgical history and problem list. Problem list updated.  Objective:   Vitals:   04/25/17 1036  BP: 113/75  Pulse: (!) 109  Weight: 175 lb 12.8 oz (79.7 kg)    Fetal Status: Fetal Heart Rate (bpm): 140 Fundal Height: 39 cm Movement: Present  Presentation: Vertex  General:  Alert, oriented and cooperative. Patient is in no acute distress.  Skin: Skin is warm and dry. No rash noted.   Cardiovascular: Normal heart rate noted  Respiratory: Normal respiratory effort, no problems with respiration noted  Abdomen: Soft, gravid, appropriate for gestational age.  Pain/Pressure: Present     Pelvic: Cervical exam performed Dilation: Closed Effacement (%): Thick Station: -3  Extremities: Normal range of motion.  Edema: None  Mental Status:  Normal mood and affect. Normal behavior. Normal judgment and thought content.   Assessment and Plan:  Pregnancy: G5P1021 at 6510w0d  1. Supervision of other normal pregnancy, antepartum Patient is doing well without complaints Will  schedule IOL at 41 weeks  2. Group B streptococcal bacteriuria Will provide prophylaxis in labor  3. Herpes simplex vulvovaginitis Continue valtrex for prophylaxis  Term labor symptoms and general obstetric precautions including but not limited to vaginal bleeding, contractions, leaking of fluid and fetal movement were reviewed in detail with the patient. Please refer to After Visit Summary for other counseling recommendations.  Return in about 1 week (around 05/02/2017) for ROB.   Catalina AntiguaPeggy Jennika Ringgold, MD

## 2017-04-26 ENCOUNTER — Encounter (HOSPITAL_COMMUNITY): Payer: Self-pay | Admitting: *Deleted

## 2017-04-26 ENCOUNTER — Telehealth (HOSPITAL_COMMUNITY): Payer: Self-pay | Admitting: *Deleted

## 2017-04-26 NOTE — Telephone Encounter (Signed)
Preadmission screen  

## 2017-05-03 ENCOUNTER — Other Ambulatory Visit: Payer: Self-pay | Admitting: Obstetrics and Gynecology

## 2017-05-06 ENCOUNTER — Encounter: Payer: Self-pay | Admitting: Obstetrics and Gynecology

## 2017-05-06 ENCOUNTER — Ambulatory Visit: Payer: Medicaid Other

## 2017-05-06 ENCOUNTER — Ambulatory Visit (INDEPENDENT_AMBULATORY_CARE_PROVIDER_SITE_OTHER): Payer: Medicaid Other | Admitting: Obstetrics and Gynecology

## 2017-05-06 VITALS — BP 102/66 | HR 102 | Wt 178.0 lb

## 2017-05-06 DIAGNOSIS — O480 Post-term pregnancy: Secondary | ICD-10-CM | POA: Diagnosis not present

## 2017-05-06 DIAGNOSIS — O09299 Supervision of pregnancy with other poor reproductive or obstetric history, unspecified trimester: Secondary | ICD-10-CM

## 2017-05-06 DIAGNOSIS — Z3483 Encounter for supervision of other normal pregnancy, third trimester: Secondary | ICD-10-CM

## 2017-05-06 DIAGNOSIS — Z3480 Encounter for supervision of other normal pregnancy, unspecified trimester: Secondary | ICD-10-CM

## 2017-05-06 DIAGNOSIS — R8271 Bacteriuria: Secondary | ICD-10-CM

## 2017-05-06 DIAGNOSIS — A6004 Herpesviral vulvovaginitis: Secondary | ICD-10-CM

## 2017-05-06 NOTE — Progress Notes (Signed)
   PRENATAL VISIT NOTE  Subjective:  Stacey [name] is a 26 y.o. G5P1021 at 1821w4d being seen today for ongoing prenatal care.  She is currently monitored for the following issues for this low-risk pregnancy and has Smoker; Major depressive disorder, recurrent, severe without psychotic features (HCC); Supervision of normal pregnancy, antepartum; History of pre-eclampsia in prior pregnancy, currently pregnant; Herpes simplex vulvovaginitis; Nausea and vomiting during pregnancy prior to [redacted] weeks gestation; Group B streptococcal bacteriuria; Headache in pregnancy; and Vaginal discharge during pregnancy on their problem list.  Patient reports no complaints.  Contractions: Irregular. Vag. Bleeding: None.  Movement: Present. Denies leaking of fluid.   The following portions of the patient's history were reviewed and updated as appropriate: allergies, current medications, past family history, past medical history, past social history, past surgical history and problem list. Problem list updated.  Objective:   Vitals:   05/06/17 1505  BP: 102/66  Pulse: (!) 102  Weight: 178 lb (80.7 kg)    Fetal Status: Fetal Heart Rate (bpm): NST   Movement: Present  Presentation: Vertex  General:  Alert, oriented and cooperative. Patient is in no acute distress.  Skin: Skin is warm and dry. No rash noted.   Cardiovascular: Normal heart rate noted  Respiratory: Normal respiratory effort, no problems with respiration noted  Abdomen: Soft, gravid, appropriate for gestational age.  Pain/Pressure: Present     Pelvic: Cervical exam performed Dilation: Closed Effacement (%): Thick Station: -3  Extremities: Normal range of motion.  Edema: None  Mental Status:  Normal mood and affect. Normal behavior. Normal judgment and thought content.   Assessment and Plan:  Pregnancy: G5P1021 at 4221w4d  1. Post-term pregnancy, 40-42 weeks of gestation - Fetal nonstress test - US OB Limited; Future  2. Supervision of other  normal pregnancy, antepartum Patient is doing well without complaints NST reviewed and reactive with baseline 140, mod variability, + accels, no decels Plan for IOL on 11/22  3. Group B streptococcal bacteriuria Will provide prophylaxis in labor  4. History of pre-eclampsia in prior pregnancy, currently pregnant Normotensive, asymptomatic ASA discontinued  5. Herpes simplex vulvovaginitis Continue suppressive therapy  Term labor symptoms and general obstetric precautions including but not limited to vaginal bleeding, contractions, leaking of fluid and fetal movement were reviewed in detail with the patient. Please refer to After Visit Summary for other counseling recommendations.  Return in about 6 weeks (around 06/17/2017) for postpartum visit.   Catalina AntiguaPeggy Rowyn Spilde, MD

## 2017-05-07 ENCOUNTER — Inpatient Hospital Stay (HOSPITAL_COMMUNITY)
Admission: AD | Admit: 2017-05-07 | Discharge: 2017-05-08 | Disposition: A | Discharge status: Home / Self Care | Payer: Medicaid Other | Source: Ambulatory Visit | Attending: Family Medicine | Admitting: Family Medicine

## 2017-05-07 ENCOUNTER — Encounter (HOSPITAL_COMMUNITY): Payer: Self-pay | Admitting: *Deleted

## 2017-05-07 DIAGNOSIS — Z3A40 40 weeks gestation of pregnancy: Secondary | ICD-10-CM | POA: Diagnosis not present

## 2017-05-07 DIAGNOSIS — R8271 Bacteriuria: Secondary | ICD-10-CM

## 2017-05-07 DIAGNOSIS — O26893 Other specified pregnancy related conditions, third trimester: Secondary | ICD-10-CM | POA: Diagnosis not present

## 2017-05-07 DIAGNOSIS — Z87891 Personal history of nicotine dependence: Secondary | ICD-10-CM | POA: Insufficient documentation

## 2017-05-07 DIAGNOSIS — O09299 Supervision of pregnancy with other poor reproductive or obstetric history, unspecified trimester: Secondary | ICD-10-CM

## 2017-05-07 DIAGNOSIS — Z8249 Family history of ischemic heart disease and other diseases of the circulatory system: Secondary | ICD-10-CM | POA: Diagnosis not present

## 2017-05-07 DIAGNOSIS — M549 Dorsalgia, unspecified: Secondary | ICD-10-CM

## 2017-05-07 DIAGNOSIS — O99891 Other specified diseases and conditions complicating pregnancy: Secondary | ICD-10-CM

## 2017-05-07 DIAGNOSIS — Z825 Family history of asthma and other chronic lower respiratory diseases: Secondary | ICD-10-CM | POA: Insufficient documentation

## 2017-05-07 DIAGNOSIS — O9989 Other specified diseases and conditions complicating pregnancy, childbirth and the puerperium: Secondary | ICD-10-CM

## 2017-05-07 DIAGNOSIS — O471 False labor at or after 37 completed weeks of gestation: Secondary | ICD-10-CM

## 2017-05-07 DIAGNOSIS — O480 Post-term pregnancy: Secondary | ICD-10-CM | POA: Diagnosis not present

## 2017-05-07 DIAGNOSIS — Z0371 Encounter for suspected problem with amniotic cavity and membrane ruled out: Secondary | ICD-10-CM

## 2017-05-07 DIAGNOSIS — R109 Unspecified abdominal pain: Secondary | ICD-10-CM | POA: Diagnosis present

## 2017-05-07 LAB — URINALYSIS, ROUTINE W REFLEX MICROSCOPIC
BILIRUBIN URINE: NEGATIVE
GLUCOSE, UA: NEGATIVE mg/dL
HGB URINE DIPSTICK: NEGATIVE
KETONES UR: NEGATIVE mg/dL
Leukocytes, UA: NEGATIVE
Nitrite: NEGATIVE
PH: 6 (ref 5.0–8.0)
PROTEIN: NEGATIVE mg/dL
Specific Gravity, Urine: 1.005 (ref 1.005–1.030)

## 2017-05-07 LAB — CBC
HCT: 33.1 % — ABNORMAL LOW (ref 36.0–46.0)
HEMOGLOBIN: 10.9 g/dL — AB (ref 12.0–15.0)
MCH: 27 pg (ref 26.0–34.0)
MCHC: 32.9 g/dL (ref 30.0–36.0)
MCV: 82.1 fL (ref 78.0–100.0)
Platelets: 305 10*3/uL (ref 150–400)
RBC: 4.03 MIL/uL (ref 3.87–5.11)
RDW: 15 % (ref 11.5–15.5)
WBC: 10.8 10*3/uL — ABNORMAL HIGH (ref 4.0–10.5)

## 2017-05-07 LAB — POCT FERN TEST: POCT FERN TEST: NEGATIVE

## 2017-05-07 NOTE — MAU Note (Signed)
Contractions started about three hours ago.  Denies LOF/VB.

## 2017-05-08 DIAGNOSIS — O471 False labor at or after 37 completed weeks of gestation: Secondary | ICD-10-CM

## 2017-05-08 NOTE — MAU Provider Note (Signed)
Chief Complaint:  Contractions   None     HPI: Stacey [name] is a 26 y.o. [id number] at [redacted]w[redacted]d who presents to MAU with cramping/contractions, leaking of fluid, and pain in her right side/right lower back.  She reports the contractions are becoming stronger but not closer together over time. They are 5-6 times per hour but not regular.  Her back/side pain is constant soreness/burning pain but worsens with movement or position change. She has tried changing positions, resting, drinking more fluids but her pain continues. She reports no gush of fluid but her underwear have been more damp x 2 days with clear fluid with no odor.  There are no other associated symptoms.  She has IOL scheduled on Thursday, 04/30/17, Thanksgiving Day and reports she is uncomfortable and does not want to wait until then if possible.  She reports good fetal movement, denies vaginal bleeding, vaginal itching/burning, urinary symptoms, h/a, dizziness, n/v, or fever/chills.   HPI  Past Medical History: Past Medical History:  Diagnosis Date  . GERD (gastroesophageal reflux disease)   . 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 Migraine     Past obstetric history: OB History  Gravida Para Term Preterm AB Living  5 1 1  0 2 1  SAB TAB Ectopic Multiple Live Births  0 2 0 0 1    # Outcome Date GA Lbr Len/2nd Weight Sex Delivery Anes PTL Lv  5 Current           4 TAB 10/30/13             Birth Comments: System Generated. Please review and update pregnancy details.  3 Term 11/02/10 [redacted]w[redacted]d  7 lb 3 oz (3.26 kg) F Vag-Spont EPI  LIV     Birth Comments: post partum pre eclampsia  2 Gravida           1 TAB              Birth Comments: System Generated. Please review and update pregnancy details.      Past Surgical History: Past Surgical History:  Procedure Laterality Date  . NO PAST SURGERIES      Family History: Family History  Problem Relation Age of Onset  . Hypertension Mother   . Asthma Brother   . Arthritis  Maternal Grandmother   . Cancer Maternal Grandmother   . Arthritis Paternal Grandmother   . Anesthesia problems Neg Hx     Social History: Social History   Tobacco Use  . Smoking status: Former Smoker    Packs/day: 0.25    Types: Cigars    Last attempt to quit: 01/24/2017    Years since quitting: 0.2  . Smokeless tobacco: Never Used  Substance Use Topics  . Alcohol use: Yes    Comment: not with pregnancy  . Drug use: No    Allergies: No Known Allergies  Meds:  No medications prior to admission.    ROS:  Review of Systems  Constitutional: Negative for chills, fatigue and fever.  Eyes: Negative for visual disturbance.  Respiratory: Negative for shortness of breath.   Cardiovascular: Negative for chest pain.  Gastrointestinal: Positive for abdominal pain. Negative for nausea and vomiting.  Genitourinary: Positive for flank pain and pelvic pain. Negative for difficulty urinating, dysuria, vaginal bleeding, vaginal discharge and vaginal pain.  Musculoskeletal: Positive for back pain.  Neurological: Negative for dizziness and headaches.  Psychiatric/Behavioral: Negative.      I have reviewed patient's Past Medical  Hx, Surgical Hx, Family Hx, Social Hx, medications and allergies.   Physical Exam   Patient Vitals for the past 24 hrs:  BP Temp Temp src Pulse Resp  05/08/17 0030 112/65 97.9 F (36.6 C) Oral 98 18  05/07/17 2211 114/69 97.6 F (36.4 C) Oral (!) 101 16   Constitutional: Well-developed, well-nourished female in no acute distress.  Cardiovascular: normal rate Respiratory: normal effort GI: Abd soft, non-tender, no rebound tenderness or guarding, gravid appropriate for gestational age.  MS: Extremities nontender, no edema, normal ROM, mild tenderness to palpation of right flank and into right low back Neurologic: Alert and oriented x 4.  GU: Neg CVAT.  PELVIC EXAM: Cervix pink, visually closed, without lesion, scant white creamy discharge, no pooling of  fluid with valsalva, vaginal walls and external genitalia normal Bimanual exam: Cervix 0/long/high, firm, anterior, neg CMT, uterus nontender, nonenlarged, adnexa without tenderness, enlargement, or mass  Dilation: 1 Effacement (%): Thick Cervical Position: Posterior Station: Ballotable Exam by:: lauren fields rn   FHT:  Baseline 135 , moderate variability, accelerations present, no decelerations Contractions: q 3-10 mins, irregular, mild to palpation   Labs: Results for orders placed or performed during the hospital encounter of 05/07/17 (from the past 24 hour(s))  POCT fern test     Status: Normal   Collection Time: 05/07/17 10:30 PM  Result Value Ref Range   POCT Fern Test Negative = intact amniotic membranes   CBC     Status: Abnormal   Collection Time: 05/07/17 11:06 PM  Result Value Ref Range   WBC 10.8 (H) 4.0 - 10.5 K/uL   RBC 4.03 3.87 - 5.11 MIL/uL   Hemoglobin 10.9 (L) 12.0 - 15.0 g/dL   HCT 16.133.1 (L) 09.636.0 - 04.546.0 %   MCV 82.1 78.0 - 100.0 fL   MCH 27.0 26.0 - 34.0 pg   MCHC 32.9 30.0 - 36.0 g/dL   RDW 40.915.0 81.111.5 - 91.415.5 %   Platelets 305 150 - 400 K/uL  Urinalysis, Routine w reflex microscopic     Status: Abnormal   Collection Time: 05/07/17 11:31 PM  Result Value Ref Range   Color, Urine STRAW (A) YELLOW   APPearance CLEAR CLEAR   Specific Gravity, Urine 1.005 1.005 - 1.030   pH 6.0 5.0 - 8.0   Glucose, UA NEGATIVE NEGATIVE mg/dL   Hgb urine dipstick NEGATIVE NEGATIVE   Bilirubin Urine NEGATIVE NEGATIVE   Ketones, ur NEGATIVE NEGATIVE mg/dL   Protein, ur NEGATIVE NEGATIVE mg/dL   Nitrite NEGATIVE NEGATIVE   Leukocytes, UA NEGATIVE NEGATIVE   A/Positive/-- (05/11 1137)  Imaging:    MAU Course/MDM: I have ordered labs and reviewed results.  NST reviewed and reactive Pt VS, including BP wnl today No evidence of ROM or labor today. Cervix 1/thick/high x 1+ hour in MAU.  With no rebound tenderness, no n/v, no elevated WBCs, and normal U/A, right sided pain  unlikely appendicitis or UTI/pyelonephritis/renal stone.  Pain is most c/w musculoskeletal pain due to pregnancy.   Rest/ice/heat/warm bath/Tylenol/pregnancy support belt for pain Keep scheduled appt for IOL in 2 days Return to MAU for signs of labor or emergencies Fetal movement counting reviewed Pt discharge with strict labor precautions.   Assessment: 1. False labor after 37 completed weeks of gestation   2. Back pain affecting pregnancy in third trimester   3. Encounter for suspected PROM, with rupture of membranes not found     Plan: Discharge home Labor precautions and fetal kick counts Follow-up Information  [facility].   Why:  On Thursday, 05/10/17, as scheduled for induction of labor.  Return to MAU as needed for signs of labor or emergencies. Contact information: [address] [place] [phone]           Sharen CounterLisa Leftwich-Kirby Certified Nurse-Midwife 05/08/2017 1:01 AM

## 2017-05-08 NOTE — Discharge Instructions (Signed)
Go to [email or web address] to try a series of positions to encourage good fetal position. These may encourage labor to begin.  Reasons to return to MAU:  1.  Contractions are  5 minutes apart or less, each last 1 minute, these have been going on for 1-2 hours, and you cannot walk or talk during them 2.  You have a large gush of fluid, or a trickle of fluid that will not stop and you have to wear a pad 3.  You have bleeding that is bright red, heavier than spotting--like menstrual bleeding (spotting can be normal in early labor or after a check of your cervix) 4.  You do not feel the baby moving like he/she normally does

## 2017-05-10 ENCOUNTER — Inpatient Hospital Stay (HOSPITAL_COMMUNITY)
Admission: RE | Admit: 2017-05-10 | Discharge: 2017-05-12 | DRG: 806 | Disposition: A | Discharge status: Home / Self Care | Payer: Medicaid Other | Source: Ambulatory Visit | Attending: Obstetrics and Gynecology | Admitting: Obstetrics and Gynecology

## 2017-05-10 ENCOUNTER — Inpatient Hospital Stay (HOSPITAL_COMMUNITY): Payer: Medicaid Other | Admitting: Anesthesiology

## 2017-05-10 ENCOUNTER — Encounter (HOSPITAL_COMMUNITY): Payer: Self-pay

## 2017-05-10 ENCOUNTER — Encounter (HOSPITAL_COMMUNITY): Payer: Self-pay | Admitting: Anesthesiology

## 2017-05-10 DIAGNOSIS — Z3A41 41 weeks gestation of pregnancy: Secondary | ICD-10-CM

## 2017-05-10 DIAGNOSIS — Z87891 Personal history of nicotine dependence: Secondary | ICD-10-CM | POA: Diagnosis not present

## 2017-05-10 DIAGNOSIS — O9902 Anemia complicating childbirth: Secondary | ICD-10-CM | POA: Diagnosis present

## 2017-05-10 DIAGNOSIS — O99824 Streptococcus B carrier state complicating childbirth: Secondary | ICD-10-CM | POA: Diagnosis present

## 2017-05-10 DIAGNOSIS — D649 Anemia, unspecified: Secondary | ICD-10-CM | POA: Diagnosis present

## 2017-05-10 DIAGNOSIS — O9832 Other infections with a predominantly sexual mode of transmission complicating childbirth: Secondary | ICD-10-CM | POA: Diagnosis present

## 2017-05-10 DIAGNOSIS — A6000 Herpesviral infection of urogenital system, unspecified: Secondary | ICD-10-CM | POA: Diagnosis present

## 2017-05-10 DIAGNOSIS — O480 Post-term pregnancy: Principal | ICD-10-CM

## 2017-05-10 DIAGNOSIS — Z3490 Encounter for supervision of normal pregnancy, unspecified, unspecified trimester: Secondary | ICD-10-CM

## 2017-05-10 DIAGNOSIS — R8271 Bacteriuria: Secondary | ICD-10-CM | POA: Diagnosis present

## 2017-05-10 DIAGNOSIS — A6004 Herpesviral vulvovaginitis: Secondary | ICD-10-CM | POA: Diagnosis present

## 2017-05-10 DIAGNOSIS — O09293 Supervision of pregnancy with other poor reproductive or obstetric history, third trimester: Secondary | ICD-10-CM

## 2017-05-10 DIAGNOSIS — O09299 Supervision of pregnancy with other poor reproductive or obstetric history, unspecified trimester: Secondary | ICD-10-CM

## 2017-05-10 LAB — CBC
HEMATOCRIT: 31.6 % — AB (ref 36.0–46.0)
HEMOGLOBIN: 10.7 g/dL — AB (ref 12.0–15.0)
MCH: 27 pg (ref 26.0–34.0)
MCHC: 33.9 g/dL (ref 30.0–36.0)
MCV: 79.6 fL (ref 78.0–100.0)
Platelets: 316 10*3/uL (ref 150–400)
RBC: 3.97 MIL/uL (ref 3.87–5.11)
RDW: 14.9 % (ref 11.5–15.5)
WBC: 8.7 10*3/uL (ref 4.0–10.5)

## 2017-05-10 LAB — TYPE AND SCREEN
ABO/RH(D): A POS
Antibody Screen: NEGATIVE

## 2017-05-10 LAB — RPR: RPR: NONREACTIVE

## 2017-05-10 MED ORDER — OXYCODONE-ACETAMINOPHEN 5-325 MG PO TABS: 1.0000 | ORAL_TABLET | ORAL | Status: DC | PRN

## 2017-05-10 MED ORDER — LIDOCAINE HCL (PF) 1 % IJ SOLN
30.0000 mL | INTRAMUSCULAR | Status: DC | PRN
  Filled 2017-05-10: qty 30

## 2017-05-10 MED ORDER — OXYTOCIN 40 UNITS IN LACTATED RINGERS INFUSION - SIMPLE MED
1.0000 m[IU]/min | INTRAVENOUS | Status: DC
  Administered 2017-05-10: 2 m[IU]/min via INTRAVENOUS

## 2017-05-10 MED ORDER — EPHEDRINE 5 MG/ML INJ
10.0000 mg | INTRAVENOUS | Status: DC | PRN
  Filled 2017-05-10: qty 2

## 2017-05-10 MED ORDER — WITCH HAZEL-GLYCERIN EX PADS: 1.0000 "application " | MEDICATED_PAD | CUTANEOUS | Status: DC | PRN

## 2017-05-10 MED ORDER — LACTATED RINGERS IV SOLN
500.0000 mL | Freq: Once | INTRAVENOUS | Status: AC
  Administered 2017-05-10: 500 mL via INTRAVENOUS

## 2017-05-10 MED ORDER — VALACYCLOVIR HCL 500 MG PO TABS
500.0000 mg | ORAL_TABLET | Freq: Two times a day (BID) | ORAL | Status: DC
  Administered 2017-05-10: 500 mg via ORAL
  Filled 2017-05-10 (×4): qty 1

## 2017-05-10 MED ORDER — PHENYLEPHRINE 40 MCG/ML (10ML) SYRINGE FOR IV PUSH (FOR BLOOD PRESSURE SUPPORT)
80.0000 ug | PREFILLED_SYRINGE | INTRAVENOUS | Status: DC | PRN
  Filled 2017-05-10: qty 5

## 2017-05-10 MED ORDER — PENICILLIN G POT IN DEXTROSE 60000 UNIT/ML IV SOLN
3.0000 10*6.[IU] | INTRAVENOUS | Status: DC
  Administered 2017-05-10 (×3): 3 10*6.[IU] via INTRAVENOUS
  Filled 2017-05-10 (×6): qty 50

## 2017-05-10 MED ORDER — SIMETHICONE 80 MG PO CHEW: 80.0000 mg | CHEWABLE_TABLET | ORAL | Status: DC | PRN

## 2017-05-10 MED ORDER — ONDANSETRON HCL 4 MG PO TABS: 4.0000 mg | ORAL_TABLET | ORAL | Status: DC | PRN

## 2017-05-10 MED ORDER — LACTATED RINGERS IV SOLN
500.0000 mL | INTRAVENOUS | Status: DC | PRN
  Administered 2017-05-10: 300 mL via INTRAVENOUS

## 2017-05-10 MED ORDER — ACETAMINOPHEN 325 MG PO TABS: 650.0000 mg | ORAL_TABLET | ORAL | Status: DC | PRN

## 2017-05-10 MED ORDER — OXYTOCIN 40 UNITS IN LACTATED RINGERS INFUSION - SIMPLE MED
2.5000 [IU]/h | INTRAVENOUS | Status: DC
  Filled 2017-05-10: qty 1000

## 2017-05-10 MED ORDER — OXYCODONE-ACETAMINOPHEN 5-325 MG PO TABS: 2.0000 | ORAL_TABLET | ORAL | Status: DC | PRN

## 2017-05-10 MED ORDER — ONDANSETRON HCL 4 MG/2ML IJ SOLN: 4.0000 mg | INTRAMUSCULAR | Status: DC | PRN

## 2017-05-10 MED ORDER — LIDOCAINE HCL (PF) 1 % IJ SOLN
INTRAMUSCULAR | Status: DC | PRN
  Administered 2017-05-10: 7 mL via EPIDURAL
  Administered 2017-05-10: 6 mL via EPIDURAL

## 2017-05-10 MED ORDER — FENTANYL 2.5 MCG/ML BUPIVACAINE 1/10 % EPIDURAL INFUSION (WH - ANES)
14.0000 mL/h | INTRAMUSCULAR | Status: DC | PRN
  Administered 2017-05-10 (×2): 14 mL/h via EPIDURAL
  Filled 2017-05-10: qty 100

## 2017-05-10 MED ORDER — FENTANYL CITRATE (PF) 100 MCG/2ML IJ SOLN
100.0000 ug | INTRAMUSCULAR | Status: DC | PRN
  Administered 2017-05-10 (×3): 100 ug via INTRAVENOUS
  Filled 2017-05-10 (×3): qty 2

## 2017-05-10 MED ORDER — OXYTOCIN BOLUS FROM INFUSION
500.0000 mL | Freq: Once | INTRAVENOUS | Status: AC
  Administered 2017-05-10: 500 mL via INTRAVENOUS

## 2017-05-10 MED ORDER — LACTATED RINGERS IV SOLN
INTRAVENOUS | Status: DC
  Administered 2017-05-10 (×3): via INTRAVENOUS

## 2017-05-10 MED ORDER — COCONUT OIL OIL
1.0000 | TOPICAL_OIL | Status: DC | PRN
Start: 2017-05-10 — End: 2017-05-12

## 2017-05-10 MED ORDER — TETANUS-DIPHTH-ACELL PERTUSSIS 5-2.5-18.5 LF-MCG/0.5 IM SUSP: 0.5000 mL | Freq: Once | INTRAMUSCULAR | Status: DC

## 2017-05-10 MED ORDER — PHENYLEPHRINE 40 MCG/ML (10ML) SYRINGE FOR IV PUSH (FOR BLOOD PRESSURE SUPPORT)
80.0000 ug | PREFILLED_SYRINGE | INTRAVENOUS | Status: DC | PRN
  Administered 2017-05-10: 80 ug via INTRAVENOUS
  Filled 2017-05-10: qty 5

## 2017-05-10 MED ORDER — DIPHENHYDRAMINE HCL 50 MG/ML IJ SOLN: 12.5000 mg | INTRAMUSCULAR | Status: DC | PRN

## 2017-05-10 MED ORDER — IBUPROFEN 600 MG PO TABS
600.0000 mg | ORAL_TABLET | Freq: Four times a day (QID) | ORAL | Status: DC
  Administered 2017-05-10 – 2017-05-12 (×7): 600 mg via ORAL
  Filled 2017-05-10 (×7): qty 1

## 2017-05-10 MED ORDER — ONDANSETRON HCL 4 MG/2ML IJ SOLN: 4.0000 mg | Freq: Four times a day (QID) | INTRAMUSCULAR | Status: DC | PRN

## 2017-05-10 MED ORDER — TERBUTALINE SULFATE 1 MG/ML IJ SOLN
0.2500 mg | Freq: Once | INTRAMUSCULAR | Status: DC | PRN
  Filled 2017-05-10: qty 1

## 2017-05-10 MED ORDER — DIBUCAINE 1 % RE OINT: 1.0000 "application " | TOPICAL_OINTMENT | RECTAL | Status: DC | PRN

## 2017-05-10 MED ORDER — FENTANYL 2.5 MCG/ML BUPIVACAINE 1/10 % EPIDURAL INFUSION (WH - ANES)
INTRAMUSCULAR | Status: AC
  Filled 2017-05-10: qty 100

## 2017-05-10 MED ORDER — BENZOCAINE-MENTHOL 20-0.5 % EX AERO
1.0000 "application " | INHALATION_SPRAY | CUTANEOUS | Status: DC | PRN
  Administered 2017-05-10: 1 via TOPICAL
  Filled 2017-05-10: qty 56

## 2017-05-10 MED ORDER — ZOLPIDEM TARTRATE 5 MG PO TABS: 5.0000 mg | ORAL_TABLET | Freq: Every evening | ORAL | Status: DC | PRN

## 2017-05-10 MED ORDER — SENNOSIDES-DOCUSATE SODIUM 8.6-50 MG PO TABS
2.0000 | ORAL_TABLET | ORAL | Status: DC
  Administered 2017-05-10 – 2017-05-11 (×2): 2 via ORAL
  Filled 2017-05-10 (×2): qty 2

## 2017-05-10 MED ORDER — DIPHENHYDRAMINE HCL 25 MG PO CAPS: 25.0000 mg | ORAL_CAPSULE | Freq: Four times a day (QID) | ORAL | Status: DC | PRN

## 2017-05-10 MED ORDER — PRENATAL MULTIVITAMIN CH
1.0000 | ORAL_TABLET | Freq: Every day | ORAL | Status: DC
  Administered 2017-05-11: 1 via ORAL
  Filled 2017-05-10: qty 1

## 2017-05-10 MED ORDER — MISOPROSTOL 25 MCG QUARTER TABLET
25.0000 ug | ORAL_TABLET | ORAL | Status: DC | PRN
  Administered 2017-05-10: 25 ug via VAGINAL
  Filled 2017-05-10 (×2): qty 1

## 2017-05-10 MED ORDER — ACETAMINOPHEN 325 MG PO TABS
650.0000 mg | ORAL_TABLET | ORAL | Status: DC | PRN
  Administered 2017-05-10 – 2017-05-11 (×2): 650 mg via ORAL
  Filled 2017-05-10 (×2): qty 2

## 2017-05-10 MED ORDER — SOD CITRATE-CITRIC ACID 500-334 MG/5ML PO SOLN: 30.0000 mL | ORAL | Status: DC | PRN

## 2017-05-10 MED ORDER — PENICILLIN G POTASSIUM 5000000 UNITS IJ SOLR
5.0000 10*6.[IU] | Freq: Once | INTRAMUSCULAR | Status: AC
  Administered 2017-05-10: 5 10*6.[IU] via INTRAVENOUS
  Filled 2017-05-10: qty 5

## 2017-05-10 MED ORDER — PHENYLEPHRINE 40 MCG/ML (10ML) SYRINGE FOR IV PUSH (FOR BLOOD PRESSURE SUPPORT)
PREFILLED_SYRINGE | INTRAVENOUS | Status: AC
  Filled 2017-05-10: qty 10

## 2017-05-10 NOTE — Progress Notes (Signed)
LABOR PROGRESS NOTE  Stacey [name] is a 26 y.o. [id number] at 3573w1d  admitted for IOL for post dates  Subjective: Patient is more uncomfortable. Reports worsening contractions.   Objective: BP (!) 102/52   Pulse 79   Temp 98.3 F (36.8 C) (Oral)   Resp 19   Ht 5' (1.524 m)   Wt 178 lb (80.7 kg)   LMP 07/26/2016 (Exact Date)   SpO2 100%   BMI 34.76 kg/m  or  Vitals:   05/10/17 0057 05/10/17 0406  BP: 122/73 (!) 102/52  Pulse: 95 79  Resp: 15 19  Temp: 97.9 F (36.6 C) 98.3 F (36.8 C)  TempSrc: Oral Oral  SpO2: 100%   Weight: 178 lb (80.7 kg)   Height: 5' (1.524 m)     Last SVE: 0138 Dilation: 1.5 Effacement (%): 30 Cervical Position: Posterior Station: -3, Ballotable Presentation: Vertex Exam by:: Degele MD   FHT: HR 130, moderate variability, + accels, no decel Toco: q2-3 min  Labs: Lab Results  Component Value Date   WBC 8.7 05/10/2017   HGB 10.7 (L) 05/10/2017   HCT 31.6 (L) 05/10/2017   MCV 79.6 05/10/2017   PLT 316 05/10/2017    Patient Active Problem List   Diagnosis Date Noted  . Encounter for induction of labor 05/10/2017  . Post term pregnancy at [redacted] weeks gestation 05/10/2017  . Headache in pregnancy 02/07/2017  . History of pre-eclampsia in prior pregnancy, currently pregnant 11/09/2016  . 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 Nausea and vomiting during pregnancy prior to [redacted] weeks gestation 11/09/2016  . Group B streptococcal bacteriuria 11/09/2016  . Supervision of normal pregnancy, antepartum 10/25/2016  . Major depressive disorder, recurrent, severe without psychotic features (HCC)   . Smoker 07/04/2014    Assessment / Plan: 26 y.o. G5P1021 at 6173w1d here for IOL.  Labor: Will hold off on 2nd dose of cytotec given increasing contractions. Foley Bulb still in place. Fetal Wellbeing:  Cat 1 Pain Control:  Fentanyl, will have epidural  Anticipated MOD: SVD   [name], MD 05/10/2017, 5:26 AM

## 2017-05-10 NOTE — Anesthesia Pain Management Evaluation Note (Signed)
  [facility] Pain Management Visit Note  Patient: Stacey SpatesDanasia J [name], 26 y.o., female  "Hello I am a [facility]. We have an anesthesia team available at all times to provide care throughout the hospital, including epidural management and anesthesia for C-section. I don't know your plan for the delivery whether it a natural birth, water birth, IV sedation, nitrous supplementation, doula or epidural, but we want to meet your pain goals."   1.Was your pain managed to your expectations on prior hospitalizations?   Yes   2.What is your expectation for pain management during this hospitalization?     Epidural  3.How can we help you reach that goal? epidural  Record the patient's initial score and the patient's pain goal.   Pain: 0  Pain Goal: 4 [facility] wants you to be able to say your pain was always managed very well.  Stacey [name] 05/10/2017

## 2017-05-10 NOTE — Progress Notes (Signed)
Labor Progress Note [name] is a 26 y.o. [id number] at 2423w1d presented for IOL for postdates. S: No complaitns  O:  BP 109/65   Pulse (!) 101   Temp 98.2 F (36.8 C) (Oral)   Resp 20   Ht 5' (1.524 m)   Wt 178 lb (80.7 kg)   LMP 07/26/2016 (Exact Date)   SpO2 99%   BMI 34.76 kg/m  EFM: 140 bpm/mod var/no decels  CVE: Dilation: 6.5 Effacement (%): 80 Cervical Position: Posterior Station: -2 Presentation: Vertex Exam by:: Foley,rn   A&P: 26 y.o. [id number] 6123w1d here for IOL for postdates #Labor: Progressing well. Foley balloon out. Start pitocin.  Rolm BookbinderAmber Noble Cicalese, DO 10:27 AM

## 2017-05-10 NOTE — Progress Notes (Signed)
1451 questionable baseline change, FHR decreased to 120-125 bpm for 12-13 minutes then gradually increased to 155 bpm.  Dr. Parke SimmersBland notified and reviewed FHR tracing, will continue to monitor.

## 2017-05-10 NOTE — Anesthesia Preprocedure Evaluation (Signed)
Anesthesia Evaluation  Patient identified by MRN, date of birth, ID band Patient awake    Reviewed: Allergy & Precautions, H&P , NPO status , Patient's Chart, lab work & pertinent test results  Airway Mallampati: I  TM Distance: >3 FB Neck ROM: full    Dental no notable dental hx. (+) Teeth Intact   Pulmonary former smoker,    Pulmonary exam normal breath sounds clear to auscultation       Cardiovascular negative cardio ROS Normal cardiovascular exam Rhythm:regular Rate:Normal     Neuro/Psych    GI/Hepatic Neg liver ROS,   Endo/Other  negative endocrine ROS  Renal/GU negative Renal ROS     Musculoskeletal   Abdominal (+) + obese,   Peds  Hematology  (+) Blood dyscrasia, anemia ,   Anesthesia Other Findings   Reproductive/Obstetrics (+) Pregnancy                             Anesthesia Physical Anesthesia Plan  ASA: II  Anesthesia Plan: Epidural   Post-op Pain Management:    Induction:   PONV Risk Score and Plan:   Airway Management Planned:   Additional Equipment:   Intra-op Plan:   Post-operative Plan:   Informed Consent: I have reviewed the patients History and Physical, chart, labs and discussed the procedure including the risks, benefits and alternatives for the proposed anesthesia with the patient or authorized representative who has indicated his/her understanding and acceptance.     Plan Discussed with:   Anesthesia Plan Comments:         Anesthesia Quick Evaluation

## 2017-05-10 NOTE — H&P (Signed)
LABOR AND DELIVERY ADMISSION HISTORY AND PHYSICAL NOTE  Stacey [name] is a 26 y.o. female [phone] with IUP at 6671w1d by LMP presenting for IOL for post dates. Patient has a history of pre-e in prior pregnancy. Patient was on ASA (11/19) discontinued on during pregnancy but has been normotensive and asymptomatic during this pregnancy. On valtrex for history of HSV.  She reports positive fetal movement. She denies leakage of fluid or vaginal bleeding.  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 GERD (gastroesophageal reflux disease)   . 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 Migraine     Past Surgical History: Past Surgical History:  Procedure Laterality Date  . NO PAST SURGERIES      Obstetrical History: OB History    Gravida Para Term Preterm AB Living   5 1 1  0 2 1   SAB TAB Ectopic Multiple Live Births   0 2 0 0 1      Social History: Social History   Socioeconomic History  . Marital status: Single    Spouse name: Not on file  . Number of children: Not on file  . Years of education: Not on file  . Highest education level: Not on file  Social Needs  . Financial resource strain: Not on file  . Food insecurity - worry: Not on file  . Food insecurity - inability: Not on file  . Transportation needs - medical: Not on file  . Transportation needs - non-medical: Not on file  Occupational History  . Not on file  Tobacco Use  . Smoking status: Former Smoker    Packs/day: 0.25    Types: Cigars    Last attempt to quit: 01/24/2017    Years since quitting: 0.2  . Smokeless tobacco: Never Used  Substance and Sexual Activity  . Alcohol use: Yes    Comment: not with pregnancy  . Drug use: No  . Sexual activity: Yes    Birth control/protection: None    Comment:  currently pregnant  Other Topics Concern  . Not on file  Social History Narrative  . Not on file    Family History: Family History  Problem Relation Age of Onset  . Hypertension Mother   . Asthma Brother   . Arthritis Maternal Grandmother   . Cancer Maternal Grandmother   . Arthritis Paternal Grandmother   . Anesthesia problems Neg Hx     Allergies: No Known Allergies  Medications Prior to Admission  Medication Sig Dispense Refill Last Dose  . acetaminophen (TYLENOL) 500 MG tablet Take 500 mg by mouth every 6 (six) hours as needed for headache.   Not Taking  . Elastic Bandages & Supports (COMFORT FIT MATERNITY SUPP MED) MISC Wear daily when ambulating (Patient not taking: Reported on 05/06/2017) 1 each 0 Not Taking  . Prenatal Vit-Fe Fumarate-FA (PREPLUS) 27-1 MG TABS   0 05/07/2017 at Unknown time  . valACYclovir (VALTREX) 1000 MG tablet Take 1 tablet (1,000 mg total) by mouth 2 (two) times daily as needed. Take for 3 days prn each outbreak. 30 tablet prn 05/07/2017 at Unknown time     Review of Systems  All systems reviewed and negative except as stated in HPI  Physical Exam: Blood pressure 122/73, pulse 95, temperature  97.9 F (36.6 C), temperature source Oral, resp. rate 15, height 5' (1.524 m), weight 178 lb (80.7 kg), last menstrual period 07/26/2016, SpO2 100 %. General appearance: alert, cooperative and no distress Lungs: clear to auscultation bilaterally Heart: regular rate and rhythm Abdomen: soft, non-tender; bowel sounds normal Extremities: No calf swelling or tenderness Speculum exam: no genital or cervical lesions noted Presentation: cephalic Fetal monitoring: HR 409130, moderate variability, + accels, no decels Uterine activity: irregular contractions q5-6 min Dilation: 1.5 Effacement (%): 30 Station: -3, Ballotable Exam by:: Wilder Kurowski MD   Prenatal labs: ABO, Rh: A/Positive/-- (05/11 1137) Antibody: Negative (05/11 1137) Rubella: 1.71 (05/11 1137) RPR:  Non Reactive (08/23 0855)  HBsAg: Negative (05/11 1137)  HIV:  Non reactive GBS:  Positive  2-hr GTT: Within normal limit, A1c 5.0   Genetic screening: Negative  Anatomy US: Normal  Prenatal Transfer Tool  Maternal Diabetes: No Genetic Screening: Normal Maternal Ultrasounds/Referrals: Normal Fetal Ultrasounds or other Referrals:  None Maternal Substance Abuse:  No Significant Maternal Medications:  Meds include: Other: Valtrex and ASA Significant Maternal Lab Results: Lab values include: Group B Strep positive  No results found for this or any previous visit (from the past 24 hour(s)).  Patient Active Problem List   Diagnosis Date Noted  . Headache in pregnancy 02/07/2017  . History of pre-eclampsia in prior pregnancy, currently pregnant 11/09/2016  . 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 Nausea and vomiting during pregnancy prior to [redacted] weeks gestation 11/09/2016  . Group B streptococcal bacteriuria 11/09/2016  . Supervision of normal pregnancy, antepartum 10/25/2016  . Major depressive disorder, recurrent, severe without psychotic features (HCC)   . Smoker 07/04/2014    Assessment: Stacey [name] is a 26 y.o. G5P1021 at 8765w1d here for IOL for post dates.  #Labor: Induction with cytotec and foley bulb #Pain: IV pain meds, epidural per patient #FWB:  Cat 1 #ID:  Positive #MOF: Both  #MOC: OCPs #Circ:  Yes  Lovena NeighboursAbdoulaye Diallo, MD 05/10/2017, 12:41 AM  OB FELLOW HISTORY AND PHYSICAL ATTESTATION I have seen and examined this patient; I agree with above documentation in the resident's note.  I have addended note as appropriate Highlights: --IOL for postdates: Cytotec and FB for cervical ripening --Hx of HSV: no genital or cervical lesions noted as above.  --GBS pos: PCN started  Frederik PearJulie P Tawsha Terrero, MD OB Fellow 05/10/2017, 2:11 AM

## 2017-05-10 NOTE — Lactation Note (Signed)
This note was copied from a baby's chart. Lactation Consultation Note  Patient Name: Stacey [name] Stacey [name]: 05/10/2017 Reason for consult: Initial assessment  Baby 5 hours old. Mom reports that she nursed her daughter for 2-3 months. Mom states that this baby nursed right after delivery, but has been sleepy since. Assisted mom to place baby STS on her chest and enc latching with cues. Discussed hand expression and finger feeding drops and/or dribbling EBM into baby's mouth as well. Discussed with mom how this helps to get the baby interested in nursing and also makes sure baby getting EBM until he latches and nurses. Mom given Stacey [name] brochure, aware of OP/BFSG and LC phone line assistance after D/C.   Maternal Data Has patient been taught Hand Expression?: Yes Does the patient have breastfeeding experience prior to this delivery?: Yes  Feeding    LATCH Score                   Interventions    Lactation Tools Discussed/Used     Consult Status Consult Status: Follow-up [name]: 05/11/17 Follow-up type: In-patient    Sherlyn HayJennifer D Maddix Heinz 05/10/2017, 9:22 PM

## 2017-05-10 NOTE — Anesthesia Procedure Notes (Signed)
Epidural Patient location during procedure: OB Start time: 05/10/2017 6:43 AM End time: 05/10/2017 6:47 AM  Staffing Anesthesiologist: Leilani AbleHatchett, Rylie Limburg, MD Performed: anesthesiologist   Preanesthetic Checklist Completed: patient identified, surgical consent, pre-op evaluation, timeout performed, IV checked, risks and benefits discussed and monitors and equipment checked  Epidural Patient position: sitting Prep: site prepped and draped and DuraPrep Patient monitoring: continuous pulse ox and blood pressure Approach: midline Location: L3-L4 Injection technique: LOR air  Needle:  Needle type: Tuohy  Needle gauge: 17 G Needle length: 9 cm and 9 Needle insertion depth: 7 cm Catheter type: closed end flexible Catheter size: 19 Gauge Catheter at skin depth: 12 cm Test dose: negative and Other  Assessment Sensory level: T9 Events: blood not aspirated, injection not painful, no injection resistance, negative IV test and no paresthesia  Additional Notes Reason for block:procedure for pain

## 2017-05-11 MED ORDER — OXYCODONE HCL 5 MG PO TABS
5.0000 mg | ORAL_TABLET | Freq: Once | ORAL | Status: AC
  Administered 2017-05-11: 5 mg via ORAL
  Filled 2017-05-11: qty 1

## 2017-05-11 MED ORDER — OXYCODONE-ACETAMINOPHEN 5-325 MG PO TABS
1.0000 | ORAL_TABLET | ORAL | Status: DC | PRN
  Administered 2017-05-11 – 2017-05-12 (×4): 1 via ORAL
  Filled 2017-05-11 (×4): qty 1

## 2017-05-11 NOTE — Progress Notes (Signed)
CSW received consult for hx of depression.  CSW met with MOB to offer support and complete assessment.    When CSW arrived, MOB was resting in bed bonding with infant as evidence by MOB engaging in skin to skin. MOB's mother was also present and MOB gave CSW permission to complete the assessment while MOB's mother was  present.   CSW inquired about MOB's thoughts and feelings about being a new mother again and MOB expressed that MOB was happy and overall felt good. CSW asked about MOB's MH hx and MOB acknowledge a hx of depression and inpatient admission at [facility]. MOB shared "I was in a bad situation and since then my situation has changed." MOB reported MOB has never taken medications and has not received any outpatient counseling services.  CSW offered MOB resources for outpatient counseling and MOB declined.   CSW provided education regarding the baby blues period vs. perinatal mood disorders, discussed treatment and gave resources for mental health follow up if concerns arise.  CSW recommends self-evaluation during the postpartum time period using the New Mom Checklist from Postpartum Progress and encouraged MOB to contact a medical professional if symptoms are noted at any time. MOB denied PPD symptoms with MOB's 25 year old.  MOB did not present with any acute symptoms and appeared to have insight and awareness.      CSW identifies no further need for intervention and no barriers to discharge at this time.  Angel Boyd-Gilyard, MSW, LCSW Clinical Social Work [phone]  

## 2017-05-11 NOTE — Lactation Note (Signed)
This note was copied from a baby's chart. Lactation Consultation Note  Patient Name: Stacey [name] ZOXWR'UToday's Date: 05/11/2017 Reason for consult: Follow-up assessment   Follow up with mom of 25 hour old infant. Infant with 6 BF for 15-60 minutes, 2 BF attempts, 5 voids and 4 stools in last 24 hours. LATCH scores 8. Infant weight 6 lb 13.2 oz with 3% weight loss since birth.   Mom reports infant is sleepy at times and when he does awaken he feeds well. Discussed awakening techniques before and during feeds to maintain suckling with feeding. Discussed spoon feeding infant if needed to awaken infant to feed. Mom reports she is able to hand express colostrum very easily.   Mom reports some nipple tenderness on the left breast as infant prefers the left breast. Enc mom to offer both breasts with each feeding if infant will take them. Mom reports infant just fed off and on for the last hour, infant asleep in mom's lap currently.   Enc mom to call out for feeding assistance as needed. Mom reports she has no questions/concerns at this time.    Maternal Data Formula Feeding for Exclusion: No Has patient been taught Hand Expression?: Yes Does the patient have breastfeeding experience prior to this delivery?: Yes  Feeding Feeding Type: Breast Fed Length of feed: 30 min  LATCH Score                   Interventions    Lactation Tools Discussed/Used     Consult Status Consult Status: Follow-up Date: 05/12/17 Follow-up type: In-patient    Silas FloodSharon S Esmay Amspacher 05/11/2017, 5:33 PM

## 2017-05-11 NOTE — Progress Notes (Signed)
POSTPARTUM PROGRESS NOTE  Post Partum Day #1 Subjective:  Stacey [name] is a 26 y.o. [id number] 1912w1d s/p SVD with shoulder distocia.  No acute events overnight.  Pt denies problems with ambulating, voiding or po intake.  She denies nausea or vomiting.  Pain is well controlled.  She has had flatus. She has not had bowel movement.  Lochia Minimal.   Objective: Blood pressure 122/72, pulse 93, temperature 98.6 F (37 C), temperature source Oral, resp. rate 18, height 5' (1.524 m), weight 178 lb (80.7 kg), last menstrual period 07/26/2016, SpO2 100 %, unknown if currently breastfeeding.  Physical Exam:  General: alert, cooperative and no distress Lochia:normal flow Chest: no respiratory distress Heart:regular rate, distal pulses intact Abdomen: soft, nontender,  Uterine Fundus: firm, appropriately tender DVT Evaluation: No calf swelling or tenderness Extremities: Mild edema  Recent Labs    05/10/17 0105  HGB 10.7*  HCT 31.6*    Assessment/Plan:  ASSESSMENT: Stacey [name] is a 26 y.o. [id number] 3812w1d s/p SVD with shoulder distocia. Endorses cramping this morning, but tolerable.  Plan for discharge tomorrow   LOS: 1 day   Dyanne Yorks DialloMD 05/11/2017, 4:50 AM

## 2017-05-11 NOTE — Anesthesia Postprocedure Evaluation (Signed)
Anesthesia Post Note  Patient: Laurence SpatesDanasia J Collignon  Procedure(s) Performed: AN AD HOC LABOR EPIDURAL     Patient location during evaluation: Mother Baby Anesthesia Type: Epidural Level of consciousness: awake and alert Pain management: pain level controlled Vital Signs Assessment: post-procedure vital signs reviewed and stable Respiratory status: spontaneous breathing Cardiovascular status: blood pressure returned to baseline Postop Assessment: no headache, patient able to bend at knees, no backache, no apparent nausea or vomiting, epidural receding and adequate PO intake Anesthetic complications: no    Last Vitals:  Vitals:   05/10/17 2330 05/11/17 0534  BP: 122/72 111/68  Pulse: 93 87  Resp: 18 18  Temp: 37 C 36.9 C  SpO2: 100% 98%    Last Pain:  Vitals:   05/11/17 0534  TempSrc: Oral  PainSc:    Pain Goal: Patients Stated Pain Goal: 3 (05/11/17 0530)               Salome ArntSterling, Ariadne Rissmiller Marie

## 2017-05-12 MED ORDER — IBUPROFEN 600 MG PO TABS: 600.0000 mg | ORAL_TABLET | Freq: Four times a day (QID) | ORAL | 0 refills | Status: DC | PRN

## 2017-05-12 NOTE — Discharge Summary (Signed)
OB Discharge Summary     Patient Name: Stacey SpatesDanasia J [name] DOB: 18-Sep-1990 MRN: [id number]  Date of admission: 05/10/2017 Delivering MD: Rolm BookbinderMOSS, AMBER   Date of discharge: 05/12/2017  Admitting diagnosis: INDUCTION Intrauterine pregnancy: 1572w1d     Secondary diagnosis:  Principal Problem:   Post term pregnancy at [redacted] weeks gestation Active Problems:   History of pre-eclampsia in prior pregnancy, currently pregnant   Herpes simplex vulvovaginitis   Group B streptococcal bacteriuria   Encounter for induction of labor   SVD (spontaneous vaginal delivery)  Additional problems: hx depression     Discharge diagnosis: Term Pregnancy Delivered                                                                                                Post partum procedures:none  Augmentation: Pitocin, Cytotec and Foley Balloon  Complications: None  Hospital course:  Induction of Labor With Vaginal Delivery   26 y.o. yo [id number] at 2772w1d was admitted to the hospital 05/10/2017 for induction of labor.  Indication for induction: Postdates.  Patient had an uncomplicated labor course as follows: Membrane Rupture Time/Date: 9:39 AM ,05/10/2017   Intrapartum Procedures: Episiotomy: None [1]                                         Lacerations:  None [1]  Patient had delivery of a Viable infant.  Information for the patient's newborn:  Vivien RossettiLynn, Boy Jameyah [[id number]][[id number]]  Delivery Method: Vag-Spont   05/10/2017  Details of delivery can be found in separate delivery note.  Patient had a routine postpartum course. Patient is discharged home 05/12/17. She had a SW eval while inpt for hx depression.  Physical exam  Vitals:   05/10/17 2330 05/11/17 0534 05/11/17 1736 05/12/17 0539  BP: 122/72 111/68 113/80 93/63  Pulse: 93 87 99 75  Resp: 18 18 18 16   Temp: 98.6 F (37 C) 98.4 F (36.9 C) 98.9 F (37.2 C) 97.8 F (36.6 C)  TempSrc: Oral Oral Oral Oral  SpO2: 100% 98% 100%   Weight:      Height:        General: alert and cooperative Lochia: appropriate Uterine Fundus: firm Incision: N/A DVT Evaluation: No evidence of DVT seen on physical exam. Labs: Lab Results  Component Value Date   WBC 8.7 05/10/2017   HGB 10.7 (L) 05/10/2017   HCT 31.6 (L) 05/10/2017   MCV 79.6 05/10/2017   PLT 316 05/10/2017   CMP Latest Ref Rng & Units 01/05/2017  Glucose 65 - 99 mg/dL 79  BUN 6 - 20 mg/dL <9(B<5(L)  Creatinine 2.840.44 - 1.00 mg/dL 1.320.44  Sodium 440135 - 102145 mmol/L 133(L)  Potassium 3.5 - 5.1 mmol/L 3.7  Chloride 101 - 111 mmol/L 106  CO2 22 - 32 mmol/L 20(L)  Calcium 8.9 - 10.3 mg/dL 9.5  Total Protein 6.5 - 8.1 g/dL 6.7  Total Bilirubin 0.3 - 1.2 mg/dL 7.2(Z0.2(L)  Alkaline Phos 38 - 126 U/L 54  AST 15 -  41 U/L 17  ALT 14 - 54 U/L 13(L)    Discharge instruction: per After Visit Summary and "Baby and Me Booklet".  After visit meds:  Allergies as of 05/12/2017   No Known Allergies     Medication List    STOP taking these medications   acetaminophen 500 MG tablet Commonly known as:  TYLENOL   COMFORT FIT MATERNITY SUPP MED Misc   valACYclovir 1000 MG tablet Commonly known as:  VALTREX     TAKE these medications   ibuprofen 600 MG tablet Commonly known as:  ADVIL,MOTRIN Take 1 tablet (600 mg total) by mouth every 6 (six) hours as needed.   PREPLUS 27-1 MG Tabs Take 1 tablet by mouth daily.       Diet: routine diet  Activity: Advance as tolerated. Pelvic rest for 6 weeks.   Outpatient follow up:4 weeks Follow up Appt:No future appointments. Follow up Visit:No Follow-up on file.  Postpartum contraception: Progesterone only pills  Newborn Data: Live born female  Birth Weight: 7 lb 0.5 oz (3189 g) APGAR: 7, 9  Newborn Delivery   Time head delivered:  05/10/2017 16:01:00 Birth date/time:  05/10/2017 16:02:00 Delivery type:  Vaginal, Spontaneous     Baby Feeding: Breast Disposition:home with mother   05/12/2017 Cam HaiSHAW, KIMBERLY, CNM 7:04 AM

## 2017-05-12 NOTE — Discharge Instructions (Signed)

## 2017-05-12 NOTE — Lactation Note (Signed)
This note was copied from a baby's chart. Lactation Consultation Note  Patient Name: Stacey Reita MayDanasia Neilan ZOXWR'UToday's Date: 05/12/2017  Mom states milk is in but breasts are comfortable.  Some nipple soreness on right.  Discussed importance of a deep latch.  Mom will use colostrum and coconut oil on nipple.  Baby is cluster feeding.  Reminded to use good breast massage during feeding.  Lactation outpatient services and support reviewed and encouraged prn.   Maternal Data    Feeding    LATCH Score                   Interventions    Lactation Tools Discussed/Used     Consult Status      Huston FoleyMOULDEN, Jillyn Stacey S 05/12/2017, 9:43 AM

## 2017-06-03 ENCOUNTER — Inpatient Hospital Stay (HOSPITAL_COMMUNITY)
Admission: AD | Admit: 2017-06-03 | Discharge: 2017-06-03 | Disposition: A | Discharge status: Home / Self Care | Payer: Medicaid Other | Source: Ambulatory Visit | Attending: Obstetrics & Gynecology | Admitting: Obstetrics & Gynecology

## 2017-06-03 ENCOUNTER — Encounter (HOSPITAL_COMMUNITY): Payer: Self-pay | Admitting: *Deleted

## 2017-06-03 DIAGNOSIS — B9689 Other specified bacterial agents as the cause of diseases classified elsewhere: Secondary | ICD-10-CM | POA: Insufficient documentation

## 2017-06-03 DIAGNOSIS — N12 Tubulo-interstitial nephritis, not specified as acute or chronic: Secondary | ICD-10-CM | POA: Diagnosis not present

## 2017-06-03 DIAGNOSIS — N760 Acute vaginitis: Secondary | ICD-10-CM | POA: Insufficient documentation

## 2017-06-03 DIAGNOSIS — N10 Acute tubulo-interstitial nephritis: Secondary | ICD-10-CM | POA: Insufficient documentation

## 2017-06-03 DIAGNOSIS — R300 Dysuria: Secondary | ICD-10-CM | POA: Diagnosis present

## 2017-06-03 DIAGNOSIS — Z87891 Personal history of nicotine dependence: Secondary | ICD-10-CM | POA: Diagnosis not present

## 2017-06-03 LAB — URINALYSIS, ROUTINE W REFLEX MICROSCOPIC
BILIRUBIN URINE: NEGATIVE
GLUCOSE, UA: NEGATIVE mg/dL
Ketones, ur: NEGATIVE mg/dL
NITRITE: NEGATIVE
PH: 5 (ref 5.0–8.0)
Protein, ur: 100 mg/dL — AB
SPECIFIC GRAVITY, URINE: 1.01 (ref 1.005–1.030)
TRANS EPITHEL UA: 1

## 2017-06-03 LAB — WET PREP, GENITAL
SPERM: NONE SEEN
Trich, Wet Prep: NONE SEEN
Yeast Wet Prep HPF POC: NONE SEEN

## 2017-06-03 LAB — POCT PREGNANCY, URINE: Preg Test, Ur: NEGATIVE

## 2017-06-03 MED ORDER — CIPROFLOXACIN HCL 500 MG PO TABS: 500.0000 mg | ORAL_TABLET | Freq: Two times a day (BID) | ORAL | 0 refills | Status: DC

## 2017-06-03 MED ORDER — METRONIDAZOLE 500 MG PO TABS: 500.0000 mg | ORAL_TABLET | Freq: Two times a day (BID) | ORAL | 0 refills | Status: DC

## 2017-06-03 NOTE — MAU Note (Signed)
Pt here with c/o pain with urination. Right sided pain for that last 2 days.

## 2017-06-03 NOTE — MAU Provider Note (Cosign Needed)
History     CSN: [id number]  Arrival date and time: 06/03/17 0125   None     Chief Complaint  Patient presents with  . Dysuria   26 yo non-pregnant patient presents with pain with urination x 2 days with associated right sided flank/ back pain. Patient says this feels like previous UTI and previous kidney infection. Also admits to vaginal discharge that is brown and has an odor. Denies fever, sweats, chills.      Past Medical History:  Diagnosis Date  . GERD (gastroesophageal reflux disease)   . 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 Migraine     Past Surgical History:  Procedure Laterality Date  . NO PAST SURGERIES      Family History  Problem Relation Age of Onset  . Hypertension Mother   . Asthma Brother   . Arthritis Maternal Grandmother   . Cancer Maternal Grandmother   . Arthritis Paternal Grandmother   . Anesthesia problems Neg Hx     Social History   Tobacco Use  . Smoking status: Former Smoker    Packs/day: 0.25    Types: Cigars    Last attempt to quit: 01/24/2017    Years since quitting: 0.3  . Smokeless tobacco: Never Used  Substance Use Topics  . Alcohol use: Yes    Comment: not with pregnancy  . Drug use: No    Allergies: No Known Allergies  Medications Prior to Admission  Medication Sig Dispense Refill Last Dose  . acetaminophen (TYLENOL) 500 MG tablet Take 500 mg by mouth every 6 (six) hours as needed.   06/02/2017 at Unknown time  . Prenatal Vit-Fe Fumarate-FA (PREPLUS) 27-1 MG TABS Take 1 tablet by mouth daily.   0 06/02/2017 at Unknown time  . ibuprofen (ADVIL,MOTRIN) 600 MG tablet Take 1 tablet (600 mg total) by mouth every 6 (six) hours as needed. 30 tablet 0 More than a month at Unknown time    Review of Systems  Constitutional: Negative for chills and fever.  HENT: Negative for congestion and dental problem.   Eyes: Negative for discharge and itching.  Respiratory: Negative for apnea and chest tightness.   Cardiovascular:  Negative for chest pain and leg swelling.  Gastrointestinal: Negative for abdominal distention and abdominal pain.  Endocrine: Negative for cold intolerance and heat intolerance.  Genitourinary: Positive for dysuria, flank pain and vaginal discharge.  Musculoskeletal: Negative for arthralgias and back pain.  Neurological: Negative for dizziness and light-headedness.  Hematological: Negative for adenopathy. Does not bruise/bleed easily.   Physical Exam   Blood pressure 120/78, pulse 97, temperature 98.5 F (36.9 C), temperature source Oral, resp. rate 18, height 5' (1.524 m), weight 163 lb (73.9 kg), SpO2 99 %, unknown if currently breastfeeding.  Physical Exam  Constitutional: She is oriented to person, place, and time. She appears well-developed and well-nourished.  HENT:  Head: Normocephalic and atraumatic.  Eyes: Conjunctivae are normal. Pupils are equal, round, and reactive to light.  Neck: Normal range of motion. Neck supple.  Cardiovascular: Normal rate and intact distal pulses.  Respiratory: Effort normal. No respiratory distress.  GI: Soft. She exhibits no distension. There is no tenderness.  Musculoskeletal: Normal range of motion. She exhibits no edema.  Right sided flank tenderness to palpation   Neurological: She is alert and oriented to person, place, and time.  Skin: Skin is warm and dry.  Psychiatric: She has a normal mood and affect. Her behavior is normal.  MAU Course  Procedures  MDM UA shows leukocytes, WBCs. With flank pain, will treat as pyelonephritis even though afebrile. Wet prep pending.  Assessment and Plan  1. acute pyelonephritis- treat with cipro 500 mg BID x 7 days. Follow up outpatient. 2. Bacterial vaginosis- treat with flagyl 500 mg BID x 7 days  [employer]ubb Corporationmber Stella Encarnacion 06/03/2017, 1:53 AM

## 2017-06-03 NOTE — Discharge Instructions (Signed)
Pyelonephritis, Adult Pyelonephritis is a kidney infection. The kidneys are organs that help clean your blood by moving waste out of your blood and into your pee (urine). This infection can happen quickly, or it can last for a long time. In most cases, it clears up with treatment and does not cause other problems. Follow these instructions at home: Medicines  Take over-the-counter and prescription medicines only as told by your doctor.  Take your antibiotic medicine as told by your doctor. Do not stop taking the medicine even if you start to feel better. General instructions  Drink enough fluid to keep your pee clear or pale yellow.  Avoid caffeine, tea, and carbonated drinks.  Pee (urinate) often. Avoid holding in pee for long periods of time.  Pee before and after sex.  After pooping (having a bowel movement), women should wipe from front to back. Use each tissue only once.  Keep all follow-up visits as told by your doctor. This is important. Contact a doctor if:  You do not feel better after 2 days.  Your symptoms get worse.  You have a fever. Get help right away if:  You cannot take your medicine or drink fluids as told.  You have chills and shaking.  You throw up (vomit).  You have very bad pain in your side (flank) or back.  You feel very weak or you pass out (faint). This information is not intended to replace advice given to you by your health care provider. Make sure you discuss any questions you have with your health care provider. Document Released: 07/12/2004 Document Revised: 11/10/2015 Document Reviewed: 09/27/2014 Elsevier Interactive Patient Education  2018 [employer]  

## 2017-06-05 ENCOUNTER — Inpatient Hospital Stay (HOSPITAL_COMMUNITY)
Admission: AD | Admit: 2017-06-05 | Discharge: 2017-06-06 | Disposition: A | Discharge status: Home / Self Care | Payer: Medicaid Other | Source: Ambulatory Visit | Attending: Obstetrics and Gynecology | Admitting: Obstetrics and Gynecology

## 2017-06-05 ENCOUNTER — Encounter (HOSPITAL_COMMUNITY): Payer: Self-pay

## 2017-06-05 DIAGNOSIS — Z87891 Personal history of nicotine dependence: Secondary | ICD-10-CM | POA: Insufficient documentation

## 2017-06-05 DIAGNOSIS — R109 Unspecified abdominal pain: Secondary | ICD-10-CM | POA: Diagnosis present

## 2017-06-05 DIAGNOSIS — N390 Urinary tract infection, site not specified: Secondary | ICD-10-CM | POA: Diagnosis not present

## 2017-06-05 LAB — URINE CULTURE

## 2017-06-05 LAB — URINALYSIS, ROUTINE W REFLEX MICROSCOPIC
Bilirubin Urine: NEGATIVE
GLUCOSE, UA: NEGATIVE mg/dL
Hgb urine dipstick: NEGATIVE
Ketones, ur: NEGATIVE mg/dL
NITRITE: NEGATIVE
PH: 6 (ref 5.0–8.0)
PROTEIN: NEGATIVE mg/dL
SPECIFIC GRAVITY, URINE: 1.013 (ref 1.005–1.030)

## 2017-06-05 NOTE — MAU Note (Signed)
Pt states she was seen here a few days ago and was told she had a UTI. Pt reports pain is much worse on right side/flank area-sharp, breath taking and constant. Pt states she has taken 2 extra strength tylenol-last took around 8-9p,- has not helped. Pt denies fever. Has had nausea and vomiting.

## 2017-06-06 ENCOUNTER — Inpatient Hospital Stay (HOSPITAL_COMMUNITY): Payer: Medicaid Other

## 2017-06-06 DIAGNOSIS — N390 Urinary tract infection, site not specified: Secondary | ICD-10-CM

## 2017-06-06 LAB — CBC WITH DIFFERENTIAL/PLATELET
BASOS PCT: 0 %
Basophils Absolute: 0 10*3/uL (ref 0.0–0.1)
EOS ABS: 0.3 10*3/uL (ref 0.0–0.7)
EOS PCT: 5 %
HCT: 36.5 % (ref 36.0–46.0)
HEMOGLOBIN: 12.1 g/dL (ref 12.0–15.0)
LYMPHS ABS: 1.9 10*3/uL (ref 0.7–4.0)
Lymphocytes Relative: 39 %
MCH: 26.8 pg (ref 26.0–34.0)
MCHC: 33.2 g/dL (ref 30.0–36.0)
MCV: 80.9 fL (ref 78.0–100.0)
MONO ABS: 0.2 10*3/uL (ref 0.1–1.0)
MONOS PCT: 5 %
Neutro Abs: 2.5 10*3/uL (ref 1.7–7.7)
Neutrophils Relative %: 51 %
Platelets: 315 10*3/uL (ref 150–400)
RBC: 4.51 MIL/uL (ref 3.87–5.11)
RDW: 14.7 % (ref 11.5–15.5)
WBC: 4.9 10*3/uL (ref 4.0–10.5)

## 2017-06-06 LAB — COMPREHENSIVE METABOLIC PANEL
ALK PHOS: 105 U/L (ref 38–126)
ALT: 15 U/L (ref 14–54)
ANION GAP: 9 (ref 5–15)
AST: 21 U/L (ref 15–41)
Albumin: 4.1 g/dL (ref 3.5–5.0)
BUN: 9 mg/dL (ref 6–20)
CALCIUM: 9.5 mg/dL (ref 8.9–10.3)
CO2: 23 mmol/L (ref 22–32)
Chloride: 109 mmol/L (ref 101–111)
Creatinine, Ser: 0.79 mg/dL (ref 0.44–1.00)
GFR calc non Af Amer: >60 mL/min (ref >60)
Glucose, Bld: 94 mg/dL (ref 65–99)
POTASSIUM: 4.2 mmol/L (ref 3.5–5.1)
SODIUM: 141 mmol/L (ref 135–145)
TOTAL PROTEIN: 7.1 g/dL (ref 6.5–8.1)
Total Bilirubin: 0.4 mg/dL (ref 0.3–1.2)

## 2017-06-06 MED ORDER — ONDANSETRON 8 MG PO TBDP
8.0000 mg | ORAL_TABLET | Freq: Once | ORAL | Status: AC
  Administered 2017-06-06: 8 mg via ORAL
  Filled 2017-06-06: qty 1

## 2017-06-06 MED ORDER — CIPROFLOXACIN HCL 500 MG PO TABS: 500.0000 mg | ORAL_TABLET | Freq: Two times a day (BID) | ORAL | 0 refills | Status: DC

## 2017-06-06 MED ORDER — NAPROXEN 500 MG PO TABS: 500.0000 mg | ORAL_TABLET | Freq: Two times a day (BID) | ORAL | 0 refills | Status: DC

## 2017-06-06 MED ORDER — OXYCODONE HCL 5 MG PO TABS: 5.0000 mg | ORAL_TABLET | Freq: Three times a day (TID) | ORAL | 0 refills | Status: DC | PRN

## 2017-06-06 MED ORDER — KETOROLAC TROMETHAMINE 60 MG/2ML IM SOLN
60.0000 mg | Freq: Once | INTRAMUSCULAR | Status: AC
  Administered 2017-06-06: 60 mg via INTRAMUSCULAR
  Filled 2017-06-06: qty 2

## 2017-06-06 MED ORDER — CEFTRIAXONE SODIUM 1 G IJ SOLR
1.0000 g | Freq: Once | INTRAMUSCULAR | Status: AC
  Administered 2017-06-06: 1 g via INTRAMUSCULAR
  Filled 2017-06-06: qty 10

## 2017-06-06 MED ORDER — OXYCODONE HCL 5 MG PO TABS
10.0000 mg | ORAL_TABLET | Freq: Once | ORAL | Status: AC
  Administered 2017-06-06: 10 mg via ORAL
  Filled 2017-06-06: qty 2

## 2017-06-06 NOTE — Discharge Instructions (Signed)
Antibiotic Medicine, Adult  Antibiotic medicines treat infections caused by a type of germ called bacteria. They work by killing the bacteria that make you sick.  When do I need to take antibiotics?  You often need these medicines to treat bacterial infections, such as:  · A urinary tract infection (UTI).  · Strep throat.  · Meningitis. This affects the spinal cord and brain.  · A bad lung infection.    You may start the medicines while your doctor waits for tests to come back. When the tests come back, your doctor may change or stop your medicine.  When are antibiotics not needed?  You do not need these medicines for most common illnesses, such as:  · A cold.  · The flu.  · A sore throat.    Antibiotics are not always needed for all infections caused by bacteria. Do not ask for these medicines, or take them, when they are not needed.  What are the risks of taking antibiotics?  Most antibiotics can cause an infection called Clostridium difficile.This causes watery poop (diarrhea). Let your doctor know right away if:  · You have watery poop while taking an antibiotic.  · You have watery poop after you stop taking an antibiotic. The illness can happen weeks after you stop the medicine.    You also have a risk of getting an infection in the future that antibiotics cannot treat (antibiotic-resistant infection). This type of infection can be dangerous.  What else should I know about taking antibiotics?  · You need to take the entire prescription.  ? Take the medicine for as long as told by your doctor.  ? Do not stop taking it even if you start to feel better.  · Try not to miss any doses. If you miss a dose, call your doctor.  · Birth control pills may not work. If you take birth control pills:  ? Keep on taking them.  ? Use a second form of birth control, such as a condom. Do this for as long as told by your doctor.  · Ask your doctor:  ? How long to wait in between doses.  ? If you should take the medicine with  food.  ? If there is anything you should stay away from while taking the antibiotic, such as:  ? Food.  ? Drinks.  ? Medicines.  ? If there are any side effects you should watch for.  · Only take the medicines that your doctor told you to take. Do not take medicines that were given to someone else.  · Drink a large glass of water with the medicine.  · Ask the pharmacist for a tool to measure the medicine, such as:  ? A syringe.  ? A cup.  ? A spoon.  · Throw away any extra medicine.  Contact a doctor if:  · You get worse.  · You have new joint pain or muscle aches after starting the medicine.  · You have side effects from the medicine, such as:  ? Stomach pain.  ? Watery poop.  ? Feeling sick to your stomach (nausea).  Get help right away if:  · You have signs of a very bad allergic reaction. If this happens, stop taking the medicine right away. Signs may include:  ? Hives. These are raised, itchy, red bumps on the skin.  ? Skin rash.  ? Trouble breathing.  ? Wheezing.  ? Swelling.  ? Feeling dizzy.  ? Throwing up (  vomiting).  · Your pee (urine) is dark, or is the color of blood.  · Your skin turns yellow.  · You bruise easily.  · You bleed easily.  · You have very bad watery poop and cramps in your belly.  · You have a very bad headache.  Summary  · Antibiotics are often used to treat infections caused by bacteria.  · Only take these medicines when needed.  · Let your doctor know if you have watery poop while taking an antibiotic.  · You need to take the entire prescription.  This information is not intended to replace advice given to you by your health care provider. Make sure you discuss any questions you have with your health care provider.  Document Released: 03/13/2008 Document Revised: 06/06/2016 Document Reviewed: 06/06/2016  Elsevier Interactive Patient Education © 2017 [employer]

## 2017-06-06 NOTE — MAU Provider Note (Signed)
History  CSN: [id number] Arrival date and time: 06/05/17 2235  None     Chief Complaint  Patient presents with  . Flank Pain    HPI: Stacey [name] is a 26 y.o. who was seen here 3 days ago for UTI/kidney infection, who  presents to maternity admissions due to worsening R flank pain. She report she has been taking cipro as prescribed and has not missed any doses. She reports constant right flank pain unrelieved with OTC Tylenol. Continue to have urinary frequency as well. Denies fever or chills. Some nausea, but no vomiting. No other new symptoms.    OB History  Gravida Para Term Preterm AB Living  5 2 2  0 2 2  SAB TAB Ectopic Multiple Live Births  0 2 0 0 2    # Outcome Date GA Lbr Len/2nd Weight Sex Delivery Anes PTL Lv  5 Term 05/10/17 4475w1d 06:06 / 00:17 7 lb 0.5 oz (3.189 kg) M Vag-Spont EPI  LIV  4 TAB 10/30/13             Birth Comments: System Generated. Please review and update pregnancy details.  3 Term 11/02/10 8812w0d  7 lb 3 oz (3.26 kg) F Vag-Spont EPI  LIV     Birth Comments: post partum pre eclampsia  2 Gravida           1 TAB              Birth Comments: System Generated. Please review and update pregnancy details.     Past Medical History:  Diagnosis Date  . Chronic kidney disease    kidney infection 2012  . GERD (gastroesophageal reflux disease)   . 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 Migraine    Past Surgical History:  Procedure Laterality Date  . NO PAST SURGERIES     Social History   Socioeconomic History  . Marital status: Single    Spouse name: Not on file  . Number of children: Not on file  . Years of education: Not on file  . Highest education level: Not on file  Social Needs  . Financial resource strain: Not on file  . Food insecurity - worry: Not on file  . Food insecurity - inability: Not on file  . Transportation needs - medical: Not on file  . Transportation needs - non-medical: Not on file  Occupational History  . Not on  file  Tobacco Use  . Smoking status: Former Smoker    Packs/day: 0.25    Types: Cigars    Last attempt to quit: 01/24/2017    Years since quitting: 0.3  . Smokeless tobacco: Never Used  Substance and Sexual Activity  . Alcohol use: No    Frequency: Never  . Drug use: No  . Sexual activity: Not Currently    Birth control/protection: None    Comment: delivvered SVD on 10 May 2017  Other Topics Concern  . Not on file  Social History Narrative  . Not on file   No Known Allergies  Medications Prior to Admission  Medication Sig Dispense Refill Last Dose  . acetaminophen (TYLENOL) 500 MG tablet Take 500 mg by mouth every 6 (six) hours as needed.   06/05/2017 at 2000  . ciprofloxacin (CIPRO) 500 MG tablet Take 1 tablet (500 mg total) by mouth every 12 (twelve) hours for 7 days. 10 tablet 0 06/05/2017 at Unknown time  . metroNIDAZOLE (FLAGYL) 500 MG tablet Take 1 tablet (  500 mg total) by mouth 2 (two) times daily. 14 tablet 0 06/05/2017 at Unknown time  . Prenatal Vit-Fe Fumarate-FA (PREPLUS) 27-1 MG TABS Take 1 tablet by mouth daily.   0 06/05/2017 at Unknown time  . ibuprofen (ADVIL,MOTRIN) 600 MG tablet Take 1 tablet (600 mg total) by mouth every 6 (six) hours as needed. 30 tablet 0 More than a month at Unknown time    I have reviewed patient's Past Medical Hx, Surgical Hx, Family Hx, Social Hx, medications and allergies.   Review of Systems: Negative except for what is mentioned in HPI.  Physical Exam   Blood pressure (!) 131/93, pulse 83, temperature 99 F (37.2 C), resp. rate 19, height 5' (1.524 m), weight 159 lb (72.1 kg), SpO2 100 %, currently breastfeeding.  Constitutional: Well-developed, well-nourished female in no acute distress.  HENT: [place]/AT, normal oropharynx mucosa. MMM Eyes: normal conjunctivae, no scleral icterus Cardiovascular: normal rate, regular rhythm Respiratory: normal effort, lungs CTAB.  GI: Abd soft, non-tender, non-distended, normoactive bowel  sounds GU: +Right CVAT MSK: Extremities nontender, no edema Neurologic: Alert and oriented x 4. Psych: Normal mood and affect Skin: warm and dry   MAU Course/MDM:   Nursing notes and VS reviewed. Patient seen and examined, as noted above.   CBC, CMP ordered Toradol IM ordered for pain  Zofran ODT for nausea Rocephin 1 g ordered  Reviewed urine culture from 12/17: E. Coli, sensitive to Cipro  Labs reviewed, wnl; no leukocytosis. Patient still uncontrolled. Will give oxycodone and get renal U/S to eval for renal stone given patient's pain in the setting of normal labs.   U/S normal.  Pain improved.   Will d/c on scheduled NSAID, and oxycodone prn.   Assessment and Plan  Assessment: 1. Complicated UTI (urinary tract infection)   2. Right flank pain    Patient seen 2 days ago, with suspected pyelonephritis, returning today for uncontrolled pain at home (taking only tylenol). She is afebrile and nontoxic appearing. No leukocytosis, and normal work up here. Pain controlled here with po oxycodone. Rocephin 1 g given. Urine culture shows E Coli sensitive to cipro.   Plan: --Continue ciprofloxacin, but will extend course (original rx for only 5 days); will do total of 10 days --Rx for naproxen 500 mg BID, and oxycodone 5 mg #10 --Discharge home in stable condition --Return precautions  Degele, Kandra NicolasJulie P, MD 06/06/2017 3:39 AM

## 2017-06-12 ENCOUNTER — Other Ambulatory Visit: Payer: Self-pay

## 2017-06-12 ENCOUNTER — Other Ambulatory Visit (HOSPITAL_COMMUNITY)
Admission: RE | Admit: 2017-06-12 | Discharge: 2017-06-12 | Disposition: A | Discharge status: Home / Self Care | Payer: Medicaid Other | Source: Ambulatory Visit | Attending: Obstetrics & Gynecology | Admitting: Obstetrics & Gynecology

## 2017-06-12 ENCOUNTER — Ambulatory Visit (INDEPENDENT_AMBULATORY_CARE_PROVIDER_SITE_OTHER): Payer: Medicaid Other | Admitting: Obstetrics & Gynecology

## 2017-06-12 ENCOUNTER — Encounter: Payer: Self-pay | Admitting: Obstetrics & Gynecology

## 2017-06-12 DIAGNOSIS — N898 Other specified noninflammatory disorders of vagina: Secondary | ICD-10-CM

## 2017-06-12 DIAGNOSIS — Z3202 Encounter for pregnancy test, result negative: Secondary | ICD-10-CM | POA: Diagnosis not present

## 2017-06-12 LAB — POCT URINE PREGNANCY: PREG TEST UR: NEGATIVE

## 2017-06-12 MED ORDER — NORETHINDRONE 0.35 MG PO TABS: 1.0000 | ORAL_TABLET | Freq: Every day | ORAL | 11 refills | Status: DC

## 2017-06-12 MED ORDER — HYDROCORTISONE 1 % EX CREA: TOPICAL_CREAM | Freq: Two times a day (BID) | CUTANEOUS | Status: DC

## 2017-06-12 NOTE — Progress Notes (Signed)
PPDS=0. Complains of hurting in butt are 8/10 x 2 days and diahorreax4 days.  Wants OCP UPT today is NEGATIVE.   Subjective:     Stacey SpatesDanasia J Demmer is a 26 y.o. female who presents for a postpartum visit. She is 4 weeks postpartum following a spontaneous vaginal delivery. I have fully reviewed the prenatal and intrapartum course. The delivery was at 41.1 gestational weeks. Outcome: spontaneous vaginal delivery. Anesthesia: epidural. Postpartum course has been UNREMARKABLE. Baby's course has been UNREMARKABLE. Baby is feeding by breast. Bleeding no bleeding. Bowel function is abnormal diahorrea. Bladder function is normal. Patient is not sexually active. Contraception method is none. Postpartum depression screening: negative.  The following portions of the patient's history were reviewed and updated as appropriate: allergies, current medications, past family history, past medical history, past social history, past surgical history and problem list.  Review of Systems Gastrointestinal: positive for hemorrhoid   Objective:    BP 126/87   Pulse 71   Ht 5' (1.524 m)   Wt 159 lb (72.1 kg)   LMP  (LMP Unknown)   Breastfeeding? Yes   BMI 31.05 kg/m   General:  alert, cooperative and no distress           Abdomen: soft, non-tender; bowel sounds normal; no masses,  no organomegaly   Vulva:  normal  Vagina: normal vagina  Cervix:  no lesions  Corpus: not examined  Adnexa:  not evaluated  Rectal Exam: external hemorrhoids        Assessment:     1 mo postpartum exam. Pap smear not done at today's visit.   Plan:    1. Contraception: oral progesterone-only contraceptive and tubal ligation 2. Sign Medicaid papers 3. Follow up  as needed.    Adam PhenixArnold, Sevilla Murtagh G, MD 06/12/2017

## 2017-06-12 NOTE — Addendum Note (Signed)
Addended by: Dalphine HandingGARDNER, Harrie Cazarez L on: 06/12/2017 04:36 PM   Modules accepted: Orders

## 2017-06-12 NOTE — Patient Instructions (Signed)
Laparoscopic Tubal Ligation Laparoscopic tubal ligation is a procedure to close the fallopian tubes. This is done so that you cannot get pregnant. When the fallopian tubes are closed, the eggs that your ovaries release cannot enter the uterus, and sperm cannot reach the released eggs. A laparoscopic tubal ligation is sometimes called "getting your tubes tied." You should not have this procedure if you want to get pregnant someday or if you are unsure about having more children. Tell a health care provider about:  Any allergies you have.  All medicines you are taking, including vitamins, herbs, eye drops, creams, and over-the-counter medicines.  Any problems you or family members have had with anesthetic medicines.  Any blood disorders you have.  Any surgeries you have had.  Any medical conditions you have.  Whether you are pregnant or may be pregnant.  Any past pregnancies. What are the risks? Generally, this is a safe procedure. However, problems may occur, including:  Infection.  Bleeding.  Injury to surrounding organs.  Side effects from anesthetics.  Failure of the procedure.  This procedure can increase your risk of a kind of pregnancy in which a fertilized egg attaches to the outside of the uterus (ectopic pregnancy). What happens before the procedure?  Ask your health care provider about: ? Changing or stopping your regular medicines. This is especially important if you are taking diabetes medicines or blood thinners. ? Taking medicines such as aspirin and ibuprofen. These medicines can thin your blood. Do not take these medicines before your procedure if your health care provider instructs you not to.  Follow instructions from your health care provider about eating and drinking restrictions.  Plan to have someone take you home after the procedure.  If you go home right after the procedure, plan to have someone with you for 24 hours. What happens during the  procedure?  You will be given one or more of the following: ? A medicine to help you relax (sedative). ? A medicine to numb the area (local anesthetic). ? A medicine to make you fall asleep (general anesthetic). ? A medicine that is injected into an area of your body to numb everything below the injection site (regional anesthetic).  An IV tube will be inserted into one of your veins. It will be used to give you medicines and fluids during the procedure.  Your bladder may be emptied with a small tube (catheter).  If you have been given a general anesthetic, a tube will be put down your throat to help you breathe.  Two small cuts (incisions) will be made in your lower abdomen and near your belly button.  Your abdomen will be inflated with a gas. This will let the surgeon see better and will give the surgeon room to work.  A thin, lighted tube (laparoscope) with a camera attached will be inserted into your abdomen through one of the incisions. Small instruments will be inserted through the other incision.  The fallopian tubes will be tied off, burned (cauterized), or blocked with a clip, ring, or clamp. A small portion in the center of each fallopian tube may be removed.  The gas will be released from the abdomen.  The incisions will be closed with stitches (sutures).  A bandage (dressing) will be placed over the incisions. The procedure may vary among health care providers and hospitals. What happens after the procedure?  Your blood pressure, heart rate, breathing rate, and blood oxygen level will be monitored often until the medicines you   were given have worn off.  You will be given medicine to help with pain, nausea, and vomiting as needed. This information is not intended to replace advice given to you by your health care provider. Make sure you discuss any questions you have with your health care provider. Document Released: 09/10/2000 Document Revised: 11/10/2015 Document  Reviewed: 05/15/2015 Elsevier Interactive Patient Education  2018 [employer]  

## 2017-06-13 ENCOUNTER — Encounter (HOSPITAL_COMMUNITY): Payer: Self-pay

## 2017-06-13 LAB — CERVICOVAGINAL ANCILLARY ONLY
BACTERIAL VAGINITIS: POSITIVE — AB
Candida vaginitis: NEGATIVE
Chlamydia: NEGATIVE
Neisseria Gonorrhea: NEGATIVE
TRICH (WINDOWPATH): NEGATIVE

## 2017-08-14 ENCOUNTER — Ambulatory Visit (HOSPITAL_BASED_OUTPATIENT_CLINIC_OR_DEPARTMENT_OTHER): Admit: 2017-08-14 | Payer: Medicaid Other | Admitting: Obstetrics & Gynecology

## 2017-08-14 ENCOUNTER — Encounter (HOSPITAL_BASED_OUTPATIENT_CLINIC_OR_DEPARTMENT_OTHER): Payer: Self-pay

## 2017-08-14 SURGERY — LIGATION, FALLOPIAN TUBE, LAPAROSCOPIC
Anesthesia: Choice | Laterality: Bilateral

## 2017-10-19 ENCOUNTER — Emergency Department (HOSPITAL_BASED_OUTPATIENT_CLINIC_OR_DEPARTMENT_OTHER)
Admission: EM | Admit: 2017-10-19 | Discharge: 2017-10-19 | Disposition: A | Discharge status: Home / Self Care | Payer: Medicaid Other | Attending: Emergency Medicine | Admitting: Emergency Medicine

## 2017-10-19 ENCOUNTER — Other Ambulatory Visit: Payer: Self-pay

## 2017-10-19 ENCOUNTER — Emergency Department (HOSPITAL_BASED_OUTPATIENT_CLINIC_OR_DEPARTMENT_OTHER): Payer: Medicaid Other

## 2017-10-19 ENCOUNTER — Encounter (HOSPITAL_BASED_OUTPATIENT_CLINIC_OR_DEPARTMENT_OTHER): Payer: Self-pay | Admitting: Emergency Medicine

## 2017-10-19 DIAGNOSIS — X500XXA Overexertion from strenuous movement or load, initial encounter: Secondary | ICD-10-CM | POA: Insufficient documentation

## 2017-10-19 DIAGNOSIS — Z79899 Other long term (current) drug therapy: Secondary | ICD-10-CM | POA: Diagnosis not present

## 2017-10-19 DIAGNOSIS — M546 Pain in thoracic spine: Secondary | ICD-10-CM

## 2017-10-19 DIAGNOSIS — T148XXA Other injury of unspecified body region, initial encounter: Secondary | ICD-10-CM

## 2017-10-19 DIAGNOSIS — S29012A Strain of muscle and tendon of back wall of thorax, initial encounter: Secondary | ICD-10-CM | POA: Diagnosis not present

## 2017-10-19 DIAGNOSIS — R079 Chest pain, unspecified: Secondary | ICD-10-CM

## 2017-10-19 DIAGNOSIS — Z87891 Personal history of nicotine dependence: Secondary | ICD-10-CM | POA: Diagnosis not present

## 2017-10-19 DIAGNOSIS — Y93E5 Activity, floor mopping and cleaning: Secondary | ICD-10-CM | POA: Insufficient documentation

## 2017-10-19 DIAGNOSIS — N189 Chronic kidney disease, unspecified: Secondary | ICD-10-CM | POA: Insufficient documentation

## 2017-10-19 DIAGNOSIS — Y929 Unspecified place or not applicable: Secondary | ICD-10-CM | POA: Insufficient documentation

## 2017-10-19 DIAGNOSIS — Z3202 Encounter for pregnancy test, result negative: Secondary | ICD-10-CM | POA: Insufficient documentation

## 2017-10-19 DIAGNOSIS — S29092A Other injury of muscle and tendon of back wall of thorax, initial encounter: Secondary | ICD-10-CM | POA: Diagnosis present

## 2017-10-19 DIAGNOSIS — Y999 Unspecified external cause status: Secondary | ICD-10-CM | POA: Insufficient documentation

## 2017-10-19 LAB — CBC WITH DIFFERENTIAL/PLATELET
BASOS ABS: 0 10*3/uL (ref 0.0–0.1)
Basophils Relative: 0 %
EOS PCT: 3 %
Eosinophils Absolute: 0.2 10*3/uL (ref 0.0–0.7)
HCT: 33.8 % — ABNORMAL LOW (ref 36.0–46.0)
Hemoglobin: 11.8 g/dL — ABNORMAL LOW (ref 12.0–15.0)
LYMPHS ABS: 1.4 10*3/uL (ref 0.7–4.0)
LYMPHS PCT: 29 %
MCH: 28.3 pg (ref 26.0–34.0)
MCHC: 34.9 g/dL (ref 30.0–36.0)
MCV: 81.1 fL (ref 78.0–100.0)
Monocytes Absolute: 0.3 10*3/uL (ref 0.1–1.0)
Monocytes Relative: 6 %
Neutro Abs: 3.1 10*3/uL (ref 1.7–7.7)
Neutrophils Relative %: 62 %
PLATELETS: 324 10*3/uL (ref 150–400)
RBC: 4.17 MIL/uL (ref 3.87–5.11)
RDW: 12.8 % (ref 11.5–15.5)
WBC: 5.1 10*3/uL (ref 4.0–10.5)

## 2017-10-19 LAB — BASIC METABOLIC PANEL
Anion gap: 9 (ref 5–15)
BUN: 11 mg/dL (ref 6–20)
CHLORIDE: 106 mmol/L (ref 101–111)
CO2: 23 mmol/L (ref 22–32)
CREATININE: 0.69 mg/dL (ref 0.44–1.00)
Calcium: 9.2 mg/dL (ref 8.9–10.3)
GFR calc Af Amer: >60 mL/min (ref >60)
GLUCOSE: 84 mg/dL (ref 65–99)
POTASSIUM: 3.7 mmol/L (ref 3.5–5.1)
SODIUM: 138 mmol/L (ref 135–145)

## 2017-10-19 LAB — D-DIMER, QUANTITATIVE: D-Dimer, Quant: 0.28 ug/mL-FEU (ref 0.00–0.50)

## 2017-10-19 LAB — PREGNANCY, URINE: PREG TEST UR: NEGATIVE

## 2017-10-19 LAB — TROPONIN I: Troponin I: <0.03 ng/mL

## 2017-10-19 MED ORDER — METHOCARBAMOL 500 MG PO TABS: 500.0000 mg | ORAL_TABLET | Freq: Two times a day (BID) | ORAL | 0 refills | Status: DC

## 2017-10-19 NOTE — Discharge Instructions (Signed)
You can take Tylenol or Ibuprofen as directed for pain. You can alternate Tylenol and Ibuprofen every 4 hours. If you take Tylenol at 1pm, then you can take Ibuprofen at 5pm. Then you can take Tylenol again at 9pm.   As we discussed, you can buy lidocaine patches at the drugstore to place over your back to help with symptoms.  You can apply heat to the affected area to help with symptoms.  Follow-up with your primary care doctor the next 24 to 48 hours for further evaluation.  Return to emergency department for any fever, chest pain, difficulty breathing, numbness/weakness of your arms or legs or any other worsening or concerning symptoms.

## 2017-10-19 NOTE — ED Notes (Signed)
Pt on cardiac monitor and auto VS 

## 2017-10-19 NOTE — ED Provider Notes (Signed)
[facility] EMERGENCY DEPARTMENT Provider Note   CSN: [id number] Arrival date & time: 10/19/17  1459     History   Chief Complaint Chief Complaint  Patient presents with  . Back Pain  . Chest Pain    HPI Stacey [name] is a 27 y.o. female past medical history of GERD, HSV who presents for evaluation of left-sided upper back pain that radiates to her chest.  Patient denies any preceding trauma, injury, fall.  He does report a prior to onset of symptoms, she was doing extensive housecleaning.  Patient reports that she feels pain that wraps around her anterior chest back to the left side of her back.  She states that this pain is worse with deep inspiration.  She reports that sometimes the pain takes her breath away.  Patient states that she has not noticed if it is worse with exertion states she has been able to carry on her normal activities without any difficulty.  She describes her back pain as a stiffness.  She states her pain is worse with movement or with bending down.  Patient states she has not taken any medications for the pain.  Patient reports she is currently on birth control pills.  Patient denies any fevers, nausea, vomiting, diaphoresis, abdominal pain, leg swelling.  Patient denies any recent hospitalizations, long trips, history of blood clots.  Patient denies any personal cardiac history.  Patient states she does not have any family cardiac history.  Patient denies any cocaine use.  The history is provided by the patient.    Past Medical History:  Diagnosis Date  . Chronic kidney disease    kidney infection 2012  . GERD (gastroesophageal reflux disease)   . 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 Migraine     Patient Active Problem List   Diagnosis Date Noted  . 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 Major depressive disorder, recurrent, severe without psychotic features (HCC)   . Smoker 07/04/2014    Past Surgical History:  Procedure Laterality  Date  . NO PAST SURGERIES       OB History    Gravida  5   Para  2   Term  2   Preterm  0   AB  2   Living  2     SAB  0   TAB  2   Ectopic  0   Multiple  0   Live Births  2            Home Medications    Prior to Admission medications   Medication Sig Start Date End Date Taking? Authorizing Provider  acetaminophen (TYLENOL) 500 MG tablet Take 500 mg by mouth every 6 (six) hours as needed.    [provider]  ciprofloxacin (CIPRO) 500 MG tablet Take 1 tablet (500 mg total) by mouth every 12 (twelve) hours. 06/06/17   Degele, Kandra Nicolas, MD  ibuprofen (ADVIL,MOTRIN) 600 MG tablet Take 1 tablet (600 mg total) by mouth every 6 (six) hours as needed. Patient not taking: Reported on 06/12/2017 05/12/17   Arabella Merles, CNM  metroNIDAZOLE (FLAGYL) 500 MG tablet Take 1 tablet (500 mg total) by mouth 2 (two) times daily. Patient not taking: Reported on 06/12/2017 06/03/17   Rolm Bookbinder, DO  naproxen (NAPROSYN) 500 MG tablet Take 1 tablet (500 mg total) by mouth 2 (two) times daily with a meal. 06/06/17   Degele, Kandra Nicolas, MD  norethindrone (MICRONOR,CAMILA,ERRIN) 0.35  MG tablet Take 1 tablet (0.35 mg total) by mouth daily. 06/12/17   Adam Phenix, MD  oxyCODONE (ROXICODONE) 5 MG immediate release tablet Take 1-2 tablets (5-10 mg total) by mouth every 8 (eight) hours as needed for severe pain. 06/06/17   Degele, Kandra Nicolas, MD  Prenatal Vit-Fe Fumarate-FA (PREPLUS) 27-1 MG TABS Take 1 tablet by mouth daily.  02/04/17   [provider]    Family History Family History  Problem Relation Age of Onset  . Hypertension Mother   . Asthma Brother   . Arthritis Maternal Grandmother   . Cancer Maternal Grandmother   . Arthritis Paternal Grandmother   . Anesthesia problems Neg Hx     Social History Social History   Tobacco Use  . Smoking status: Former Smoker    Packs/day: 0.25    Types: Cigars    Last attempt to quit: 01/24/2017    Years since quitting:  0.7  . Smokeless tobacco: Never Used  Substance Use Topics  . Alcohol use: No    Frequency: Never  . Drug use: No     Allergies   Patient has no known allergies.   Review of Systems Review of Systems  Constitutional: Negative for fever.  Respiratory: Negative for cough and shortness of breath.   Cardiovascular: Positive for chest pain. Negative for leg swelling.  Gastrointestinal: Negative for abdominal pain, nausea and vomiting.  Genitourinary: Negative for dysuria and hematuria.  Musculoskeletal: Positive for back pain.  Neurological: Negative for headaches.  All other systems reviewed and are negative.    Physical Exam Updated Vital Signs BP 114/75   Pulse 72   Temp 98.6 F (37 C) (Oral)   Resp 13   Ht 5' (1.524 m)   Wt 72.1 kg (159 lb)   LMP 07/12/2017   SpO2 100%   Breastfeeding? Yes   BMI 31.05 kg/m   Physical Exam  Constitutional: She is oriented to person, place, and time. She appears well-developed and well-nourished.  HENT:  Head: Normocephalic and atraumatic.  Mouth/Throat: Oropharynx is clear and moist and mucous membranes are normal.  Eyes: Pupils are equal, round, and reactive to light. Conjunctivae, EOM and lids are normal.  Neck: Full passive range of motion without pain.  Full flexion/extension and lateral movement of neck fully intact. No bony midline tenderness. No deformities or crepitus.   Cardiovascular: Normal rate, regular rhythm, normal heart sounds and normal pulses. Exam reveals no gallop and no friction rub.  No murmur heard. Pulses:      Radial pulses are 2+ on the right side, and 2+ on the left side.  Pulmonary/Chest: Effort normal and breath sounds normal.  Lungs clear to auscultation bilaterally.  Able speak in full sentences without any difficulty.  Pain is reproduced with palpation of anterior chest wall, posterior thoracic paraspinal muscles.  Pain is also reproduced with movement of the left upper extremity.  Abdominal: Soft.  Normal appearance. There is no tenderness. There is no rigidity and no guarding.  Musculoskeletal: Normal range of motion.       Lumbar back: She exhibits no tenderness.       Back:  Bilateral extremities are symmetric in appearance.  Neurological: She is alert and oriented to person, place, and time.  Skin: Skin is warm and dry. Capillary refill takes less than 2 seconds. No rash noted.  Psychiatric: She has a normal mood and affect. Her speech is normal.  Nursing note and vitals reviewed.    ED Treatments /  Results  Labs (all labs ordered are listed, but only abnormal results are displayed) Labs Reviewed  CBC WITH DIFFERENTIAL/PLATELET - Abnormal; Notable for the following components:      Result Value   Hemoglobin 11.8 (*)    HCT 33.8 (*)    All other components within normal limits  PREGNANCY, URINE  BASIC METABOLIC PANEL  D-DIMER, QUANTITATIVE (NOT AT [facility])  TROPONIN I    EKG None  Radiology Dg Chest 2 View  Result Date: 10/19/2017 CLINICAL DATA:  Chest pain and upper back pain. EXAM: CHEST - 2 VIEW COMPARISON:  None. FINDINGS: The heart size and mediastinal contours are within normal limits. Both lungs are clear. The visualized skeletal structures are unremarkable. IMPRESSION: No active cardiopulmonary disease. Electronically Signed   By: Ted Mcalpine M.D.   On: 10/19/2017 16:15    Procedures Procedures (including critical care time)  Medications Ordered in ED Medications - No data to display   Initial Impression / Assessment and Plan / ED Course  I have reviewed the triage vital signs and the nursing notes.  Pertinent labs & imaging results that were available during my care of the patient were reviewed by me and considered in my medical decision making (see chart for details).     27 year old female who presents for evaluation of left-sided upper back pain and chest pain.  Patient reports is been ongoing for the last 3 to 4 days.  Reports weight pain  is worse with deep inspiration.  Not worse with exertion.  Patient reports she has not taken anything for the pain.  No fevers.  Patient reports she is on oral birth control pills.  No other PE risk factors. Patient is afebrile, non-toxic appearing, sitting comfortably on examination table. Vital signs reviewed and stable.  On exam, patient has tenderness palpation noted to the paraspinal muscles of the thoracic region that extends just to the midline.  Patient also with muscular tenderness overlying the anterior chest wall that is reproduced with palpation and movement of the left upper extremity.  Lungs clear to auscultation bilaterally.  Regular rate and rhythm.  Suspect the symptoms are likely due to muscle strain given history/physical exam.  Consider muscle strain versus infectious etiology versus ACS etiology.  Low suspicion for PE given history/physical exam and the fact that patient is not tachycardic, hypoxic but given that she is on OCPs, she does have a risk factor. Given that patient is complaining of some anterior chest pain that is worse with deep inspiration and the fact the patient is on oral birth control pills, will plan for further evaluation with labs, EKG, chest x-ray.    CBC without any significant leukocytosis.  Hemoglobin is 11.8.  Otherwise unremarkable.  Troponin is negative.  D-dimer is negative.  BMP without any acute abnormalities.  Chest x-ray negative for any acute abnormalities.  EKG shows normal sinus rhythm, rate 80.   Discussed results with patient.  I suspect that today's symptoms are most likely musculoskeletal in nature.  Given that her pain has been ongoing and constant for last 2 days, one troponin is sufficient for ACS rule out.  Do not suspect ACS etiology given presentation, patient's age, lack of risk factors.  We will plan to treat as musculoskeletal pain.  Initial plan on Robaxin for discharge home but patient is currently breast-feeding.  Encouraged NSAID use for  pain relief and supportive at home measures.  Instructed patient follow-up with PCP for further evaluation. Patient had ample opportunity for  questions and discussion. All patient's questions were answered with full understanding. Strict return precautions discussed. Patient expresses understanding and agreement to plan.     Final Clinical Impressions(s) / ED Diagnoses   Final diagnoses:  Acute left-sided thoracic back pain  Chest pain, unspecified type  Muscle strain    ED Discharge Orders        Ordered    methocarbamol (ROBAXIN) 500 MG tablet  2 times daily,   Status:  Discontinued     10/19/17 1711       Maxwell Caul, PA-C 10/19/17 2327    Rolan Bucco, MD 10/20/17 0002

## 2017-10-19 NOTE — ED Triage Notes (Signed)
Patient states that she was doing some heavy cleaning about 3 -4 days ago  - when she woke up the next morning she started to have some mid back stiffness. Denies any SOB

## 2018-04-29 ENCOUNTER — Encounter (HOSPITAL_COMMUNITY): Payer: Self-pay | Admitting: Emergency Medicine

## 2018-04-29 ENCOUNTER — Emergency Department (HOSPITAL_COMMUNITY)
Admission: EM | Admit: 2018-04-29 | Discharge: 2018-04-29 | Disposition: A | Discharge status: Home / Self Care | Payer: Medicaid Other | Attending: Emergency Medicine | Admitting: Emergency Medicine

## 2018-04-29 ENCOUNTER — Other Ambulatory Visit: Payer: Self-pay

## 2018-04-29 ENCOUNTER — Emergency Department (HOSPITAL_COMMUNITY): Payer: Medicaid Other

## 2018-04-29 DIAGNOSIS — Z87891 Personal history of nicotine dependence: Secondary | ICD-10-CM | POA: Insufficient documentation

## 2018-04-29 DIAGNOSIS — J029 Acute pharyngitis, unspecified: Secondary | ICD-10-CM | POA: Insufficient documentation

## 2018-04-29 DIAGNOSIS — R509 Fever, unspecified: Secondary | ICD-10-CM | POA: Insufficient documentation

## 2018-04-29 DIAGNOSIS — R070 Pain in throat: Secondary | ICD-10-CM | POA: Diagnosis present

## 2018-04-29 HISTORY — DX: Renal tubulo-interstitial disease, unspecified: N15.9

## 2018-04-29 LAB — I-STAT CHEM 8, ED
BUN: 4 mg/dL — AB (ref 6–20)
Calcium, Ion: 1.17 mmol/L (ref 1.15–1.40)
Chloride: 102 mmol/L (ref 98–111)
Creatinine, Ser: 0.7 mg/dL (ref 0.44–1.00)
Glucose, Bld: 108 mg/dL — ABNORMAL HIGH (ref 70–99)
HEMATOCRIT: 40 % (ref 36.0–46.0)
HEMOGLOBIN: 13.6 g/dL (ref 12.0–15.0)
Potassium: 3.8 mmol/L (ref 3.5–5.1)
SODIUM: 138 mmol/L (ref 135–145)
TCO2: 26 mmol/L (ref 22–32)

## 2018-04-29 LAB — I-STAT BETA HCG BLOOD, ED (MC, WL, AP ONLY): I-stat hCG, quantitative: <5 m[IU]/mL (ref <5)

## 2018-04-29 LAB — GROUP A STREP BY PCR: Group A Strep by PCR: NOT DETECTED

## 2018-04-29 MED ORDER — FLUCONAZOLE 150 MG PO TABS: 150.0000 mg | ORAL_TABLET | Freq: Once | ORAL | 0 refills | Status: AC

## 2018-04-29 MED ORDER — IOHEXOL 300 MG/ML  SOLN
75.0000 mL | Freq: Once | INTRAMUSCULAR | Status: AC | PRN
  Administered 2018-04-29: 75 mL via INTRAVENOUS

## 2018-04-29 MED ORDER — SODIUM CHLORIDE 0.9 % IV BOLUS
1000.0000 mL | Freq: Once | INTRAVENOUS | Status: AC
  Administered 2018-04-29: 1000 mL via INTRAVENOUS

## 2018-04-29 MED ORDER — LIDOCAINE VISCOUS HCL 2 % MT SOLN: 15.0000 mL | OROMUCOSAL | 0 refills | Status: DC | PRN

## 2018-04-29 MED ORDER — NAPROXEN 500 MG PO TABS: 500.0000 mg | ORAL_TABLET | Freq: Two times a day (BID) | ORAL | 0 refills | Status: DC

## 2018-04-29 MED ORDER — AMOXICILLIN 500 MG PO CAPS: 1000.0000 mg | ORAL_CAPSULE | Freq: Every day | ORAL | 0 refills | Status: DC

## 2018-04-29 MED ORDER — ACETAMINOPHEN 325 MG PO TABS
650.0000 mg | ORAL_TABLET | Freq: Once | ORAL | Status: AC
  Administered 2018-04-29: 650 mg via ORAL
  Filled 2018-04-29: qty 2

## 2018-04-29 MED ORDER — DEXAMETHASONE SODIUM PHOSPHATE 10 MG/ML IJ SOLN
10.0000 mg | Freq: Once | INTRAMUSCULAR | Status: AC
Start: 2018-04-29 — End: 2018-04-29
  Administered 2018-04-29: 10 mg via INTRAMUSCULAR
  Filled 2018-04-29: qty 1

## 2018-04-29 NOTE — Discharge Instructions (Addendum)
Take the antibiotics as directed.  Please complete the entire course of antibiotics regardless of symptom improvement to prevent worsening or recurrence of your infection. Return to ED for worsening symptoms, trouble breathing or trouble swallowing, chest pain or shortness of breath, vomiting or coughing up blood.

## 2018-04-29 NOTE — ED Provider Notes (Signed)
[facility] EMERGENCY DEPARTMENT Provider Note   CSN: [id number] Arrival date & time: 04/29/18  1809     History   Chief Complaint Chief Complaint  Patient presents with  . Sore Throat    HPI Stacey [name] is a 27 y.o. female who presents to ED for 1 week history of sore throat gradually worsened yesterday and today.  She states that her throat feels "swollen."  She had a temperature of 100.9 at home about 3 hours ago.  She denies any antipyretic use.  She notes sick contacts at home with similar symptoms.  Denies any cough, rhinorrhea, ear pain.  No history of PTA in the past.  She does state that "I carry strep all the time."  HPI  Past Medical History:  Diagnosis Date  . GERD (gastroesophageal reflux disease)   . 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 Kidney infection    kidney infection 2012  . Migraine     Patient Active Problem List   Diagnosis Date Noted  . 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 Major depressive disorder, recurrent, severe without psychotic features (HCC)   . Smoker 07/04/2014    Past Surgical History:  Procedure Laterality Date  . NO PAST SURGERIES       OB History    Gravida  5   Para  2   Term  2   Preterm  0   AB  2   Living  2     SAB  0   TAB  2   Ectopic  0   Multiple  0   Live Births  2            Home Medications    Prior to Admission medications   Medication Sig Start Date End Date Taking? Authorizing Provider  acetaminophen (TYLENOL) 500 MG tablet Take 500 mg by mouth every 6 (six) hours as needed.    [provider]  amoxicillin (AMOXIL) 500 MG capsule Take 2 capsules (1,000 mg total) by mouth daily for 10 days. 04/29/18 05/09/18  Marchele Decock, PA-C  ciprofloxacin (CIPRO) 500 MG tablet Take 1 tablet (500 mg total) by mouth every 12 (twelve) hours. 06/06/17   Degele, Kandra Nicolas, MD  fluconazole (DIFLUCAN) 150 MG tablet Take 1 tablet (150 mg total) by mouth once for 1  dose. 04/29/18 04/29/18  Berdell Nevitt, PA-C  ibuprofen (ADVIL,MOTRIN) 600 MG tablet Take 1 tablet (600 mg total) by mouth every 6 (six) hours as needed. Patient not taking: Reported on 06/12/2017 05/12/17   Arabella Merles, CNM  lidocaine (XYLOCAINE) 2 % solution Use as directed 15 mLs in the mouth or throat as needed for mouth pain. 04/29/18   Cavion Faiola, PA-C  metroNIDAZOLE (FLAGYL) 500 MG tablet Take 1 tablet (500 mg total) by mouth 2 (two) times daily. Patient not taking: Reported on 06/12/2017 06/03/17   Rolm Bookbinder, DO  naproxen (NAPROSYN) 500 MG tablet Take 1 tablet (500 mg total) by mouth 2 (two) times daily. 04/29/18   Lanessa Shill, PA-C  norethindrone (MICRONOR,CAMILA,ERRIN) 0.35 MG tablet Take 1 tablet (0.35 mg total) by mouth daily. 06/12/17   Adam Phenix, MD  oxyCODONE (ROXICODONE) 5 MG immediate release tablet Take 1-2 tablets (5-10 mg total) by mouth every 8 (eight) hours as needed for severe pain. 06/06/17   Degele, Kandra Nicolas, MD  Prenatal Vit-Fe Fumarate-FA (PREPLUS) 27-1 MG TABS Take 1 tablet by mouth daily.  02/04/17  [provider]    Family History Family History  Problem Relation Age of Onset  . Hypertension Mother   . Asthma Brother   . Arthritis Maternal Grandmother   . Cancer Maternal Grandmother   . Arthritis Paternal Grandmother   . Anesthesia problems Neg Hx     Social History Social History   Tobacco Use  . Smoking status: Former Smoker    Packs/day: 1.00    Types: Cigars    Last attempt to quit: 01/24/2017    Years since quitting: 1.2  . Smokeless tobacco: Never Used  Substance Use Topics  . Alcohol use: No    Frequency: Never  . Drug use: No     Allergies   Patient has no known allergies.   Review of Systems Review of Systems  Constitutional: Positive for fever. Negative for appetite change and chills.  HENT: Positive for sore throat. Negative for ear pain, rhinorrhea and sneezing.   Eyes: Negative for photophobia and visual  disturbance.  Respiratory: Negative for cough, chest tightness, shortness of breath and wheezing.   Cardiovascular: Negative for chest pain and palpitations.  Gastrointestinal: Negative for abdominal pain, blood in stool, constipation, diarrhea, nausea and vomiting.  Genitourinary: Negative for dysuria, hematuria and urgency.  Musculoskeletal: Negative for myalgias.  Skin: Negative for rash.  Neurological: Negative for dizziness, weakness and light-headedness.     Physical Exam Updated Vital Signs BP 121/84   Pulse 86   Temp 99.6 F (37.6 C) (Oral)   Resp 16   LMP 04/12/2018   SpO2 100%   Physical Exam  Constitutional: She appears well-developed and well-nourished. No distress.  Speaking in complete sentences without difficulty.  NAD.  HENT:  Head: Normocephalic and atraumatic.  Right Ear: Tympanic membrane normal.  Left Ear: Tympanic membrane normal.  Nose: Nose normal.  Mouth/Throat: Uvula is midline and oropharynx is clear and moist. Tonsils are 2+ on the right. Tonsils are 2+ on the left. Tonsillar exudate.  Bilaterally, symmetrically enlarged tonsils with exudates. Patient does not appear to be in acute distress. No trismus or drooling present. No pooling of secretions. Patient is tolerating secretions and is not in respiratory distress. No neck pain or tenderness to palpation of the neck. Full active and passive range of motion of the neck. No evidence of RPA or PTA.  Eyes: Conjunctivae and EOM are normal. Left eye exhibits no discharge. No scleral icterus.  Neck: Normal range of motion. Neck supple.  Cardiovascular: Regular rhythm, normal heart sounds and intact distal pulses. Tachycardia present. Exam reveals no gallop and no friction rub.  No murmur heard. Pulmonary/Chest: Effort normal and breath sounds normal. No respiratory distress.  Abdominal: Soft. Bowel sounds are normal. She exhibits no distension. There is no tenderness. There is no guarding.  Musculoskeletal:  Normal range of motion. She exhibits no edema.  Neurological: She is alert. She exhibits normal muscle tone. Coordination normal.  Skin: Skin is warm and dry. No rash noted.  Psychiatric: She has a normal mood and affect.  Nursing note and vitals reviewed.    ED Treatments / Results  Labs (all labs ordered are listed, but only abnormal results are displayed) Labs Reviewed  I-STAT CHEM 8, ED - Abnormal; Notable for the following components:      Result Value   BUN 4 (*)    Glucose, Bld 108 (*)    All other components within normal limits  GROUP A STREP BY PCR  I-STAT BETA HCG BLOOD, ED ([facility], [facility],  [facility] ONLY)    EKG None  Radiology Ct Soft Tissue Neck W Contrast  Result Date: 04/29/2018 CLINICAL DATA:  Sore throat and dysphagia EXAM: CT NECK WITH CONTRAST TECHNIQUE: Multidetector CT imaging of the neck was performed using the standard protocol following the bolus administration of intravenous contrast. CONTRAST:  75mL OMNIPAQUE IOHEXOL 300 MG/ML  SOLN COMPARISON:  None. FINDINGS: PHARYNX AND LARYNX: --Nasopharynx: Fossae of Rosenmuller are clear. Normal adenoid tonsils for age. --Oral cavity and oropharynx: The palatine and lingual tonsils are enlarged. There is multifocal hypoattenuation within the palatine tonsils but no discrete abscess or drainable fluid collection. --Hypopharynx: Normal vallecula and pyriform sinuses. --Larynx: Normal epiglottis and pre-epiglottic space. Normal aryepiglottic and vocal folds. --Retropharyngeal space: No abscess, effusion or lymphadenopathy. SALIVARY GLANDS: --Parotid: No mass lesion or inflammation. No sialolithiasis or ductal dilatation. --Submandibular: Symmetric without inflammation. No sialolithiasis or ductal dilatation. --Sublingual: Normal. No ranula or other visible lesion of the base of tongue and floor of mouth. THYROID: Normal. LYMPH NODES: There is bilateral reactive lymphadenopathy at level 2A. VASCULAR: Major cervical vessels are patent.  LIMITED INTRACRANIAL: Normal. VISUALIZED ORBITS: Normal. MASTOIDS AND VISUALIZED PARANASAL SINUSES: No fluid levels or advanced mucosal thickening. No mastoid effusion. SKELETON: No bony spinal canal stenosis. No lytic or blastic lesions. UPPER CHEST: Clear. OTHER: None. IMPRESSION: 1. Acute palatine and lingual tonsillopharyngitis without abscess or drainable fluid collection. 2. Reactive bilateral cervical lymphadenopathy. Electronically Signed   By: Deatra RobinsonKevin  Herman M.D.   On: 04/29/2018 20:48    Procedures Procedures (including critical care time)  Medications Ordered in ED Medications  dexamethasone (DECADRON) injection 10 mg (10 mg Intramuscular Given 04/29/18 1844)  acetaminophen (TYLENOL) tablet 650 mg (650 mg Oral Given 04/29/18 1844)  sodium chloride 0.9 % bolus 1,000 mL (0 mLs Intravenous Stopped 04/29/18 2104)  iohexol (OMNIPAQUE) 300 MG/ML solution 75 mL (75 mLs Intravenous Contrast Given 04/29/18 2009)     Initial Impression / Assessment and Plan / ED Course  I have reviewed the triage vital signs and the nursing notes.  Pertinent labs & imaging results that were available during my care of the patient were reviewed by me and considered in my medical decision making (see chart for details).  Clinical Course as of Apr 29 2104  Tue Apr 29, 2018  2058 Heart rate improved to 97 with fluids.   [HK]  2104 Pulse Rate: 86 [HK]    Clinical Course User Index [HK] Dietrich PatesKhatri, Belen Pesch, PA-C    27 year old female presents to ED for 1 week history of sore throat gradually worsened yesterday and today.  She had a temperature of 100.9 at home about 3 hours ago.  No recent antipyretics.  Sick contacts at home with similar symptoms.  On exam tonsils are bilaterally, symmetrically enlarged with exudates.  Patient does report pain and difficulty with swallowing.  She is tachycardic to low 120s here.  She is afebrile.  Lab work significant for negative strep, hCG negative and i-STAT Chem-8 unremarkable.   Patient given Decadron, fluids and will obtain CT soft tissue of the neck to rule out abscess or other deep tissue infection due to physical exam findings, vital signs and negative strep.  9:05 PM CT of the neck shows acute tonsillopharyngitis without abscess or fluid collection.  Will treat for bacterial infection with antibiotics.  Patient's tachycardia has improved with fluids.  Will give lidocaine to swish and spit as well as naproxen for discomfort.  Advised to return to ED for any severe worsening symptoms.  Patient is hemodynamically stable, in NAD, and able to ambulate in the ED. Evaluation does not show pathology that would require ongoing emergent intervention or inpatient treatment. I explained the diagnosis to the patient. Pain has been managed and has no complaints prior to discharge. Patient is comfortable with above plan and is stable for discharge at this time. All questions were answered prior to disposition. Strict return precautions for returning to the ED were discussed. Encouraged follow up with PCP.    Portions of this note were generated with Scientist, clinical (histocompatibility and immunogenetics). Dictation errors may occur despite best attempts at proofreading.  Final Clinical Impressions(s) / ED Diagnoses   Final diagnoses:  Pharyngitis, unspecified etiology    ED Discharge Orders         Ordered    amoxicillin (AMOXIL) 500 MG capsule  Daily     04/29/18 2058    lidocaine (XYLOCAINE) 2 % solution  As needed     04/29/18 2058    naproxen (NAPROSYN) 500 MG tablet  2 times daily     04/29/18 2058    fluconazole (DIFLUCAN) 150 MG tablet   Once     04/29/18 2102           Dietrich Pates, PA-C 04/29/18 2105    Margarita Grizzle, MD 05/07/18 2325

## 2018-04-29 NOTE — ED Notes (Signed)
Patient verbalizes understanding of discharge instructions. Opportunity for questioning and answers were provided. Armband removed by staff, pt discharged from ED. Pt ambulatory to lobby.  

## 2018-04-29 NOTE — ED Triage Notes (Signed)
Pt reports sore throat since last Thursday, worsening over the last couple of days. Pt reports difficulty swallowing, denies sick contact, cough, or nasal drainage. Pt reports temp of 100.9 at home. Pt afebrile here.

## 2018-05-04 ENCOUNTER — Emergency Department (HOSPITAL_COMMUNITY)
Admission: EM | Admit: 2018-05-04 | Discharge: 2018-05-04 | Disposition: A | Discharge status: Home / Self Care | Payer: Medicaid Other | Attending: Emergency Medicine | Admitting: Emergency Medicine

## 2018-05-04 ENCOUNTER — Other Ambulatory Visit: Payer: Self-pay

## 2018-05-04 ENCOUNTER — Encounter (HOSPITAL_COMMUNITY): Payer: Self-pay

## 2018-05-04 DIAGNOSIS — Z79899 Other long term (current) drug therapy: Secondary | ICD-10-CM | POA: Diagnosis not present

## 2018-05-04 DIAGNOSIS — Z87891 Personal history of nicotine dependence: Secondary | ICD-10-CM | POA: Diagnosis not present

## 2018-05-04 DIAGNOSIS — J029 Acute pharyngitis, unspecified: Secondary | ICD-10-CM

## 2018-05-04 LAB — I-STAT CHEM 8, ED
BUN: 6 mg/dL (ref 6–20)
Calcium, Ion: 1.16 mmol/L (ref 1.15–1.40)
Chloride: 101 mmol/L (ref 98–111)
Creatinine, Ser: 0.7 mg/dL (ref 0.44–1.00)
Glucose, Bld: 92 mg/dL (ref 70–99)
HEMATOCRIT: 41 % (ref 36.0–46.0)
Hemoglobin: 13.9 g/dL (ref 12.0–15.0)
POTASSIUM: 3.4 mmol/L — AB (ref 3.5–5.1)
Sodium: 138 mmol/L (ref 135–145)
TCO2: 27 mmol/L (ref 22–32)

## 2018-05-04 LAB — MONONUCLEOSIS SCREEN: Mono Screen: NEGATIVE

## 2018-05-04 LAB — CBC WITH DIFFERENTIAL/PLATELET
Abs Immature Granulocytes: 0.04 10*3/uL (ref 0.00–0.07)
BASOS ABS: 0 10*3/uL (ref 0.0–0.1)
Basophils Relative: 0 %
Eosinophils Absolute: 0 10*3/uL (ref 0.0–0.5)
Eosinophils Relative: 0 %
HCT: 38.7 % (ref 36.0–46.0)
HEMOGLOBIN: 12.5 g/dL (ref 12.0–15.0)
IMMATURE GRANULOCYTES: 0 %
LYMPHS ABS: 1.1 10*3/uL (ref 0.7–4.0)
LYMPHS PCT: 11 %
MCH: 26.7 pg (ref 26.0–34.0)
MCHC: 32.3 g/dL (ref 30.0–36.0)
MCV: 82.7 fL (ref 80.0–100.0)
Monocytes Absolute: 1.4 10*3/uL — ABNORMAL HIGH (ref 0.1–1.0)
Monocytes Relative: 14 %
NEUTROS ABS: 7.4 10*3/uL (ref 1.7–7.7)
NEUTROS PCT: 75 %
Platelets: 254 10*3/uL (ref 150–400)
RBC: 4.68 MIL/uL (ref 3.87–5.11)
RDW: 12.8 % (ref 11.5–15.5)
WBC: 10 10*3/uL (ref 4.0–10.5)
nRBC: 0 % (ref 0.0–0.2)

## 2018-05-04 MED ORDER — HYDROCODONE-ACETAMINOPHEN 7.5-325 MG/15ML PO SOLN
10.0000 mL | Freq: Once | ORAL | Status: AC
  Administered 2018-05-04: 10 mL via ORAL
  Filled 2018-05-04: qty 15

## 2018-05-04 MED ORDER — SODIUM CHLORIDE 0.9 % IV BOLUS
1000.0000 mL | Freq: Once | INTRAVENOUS | Status: AC
  Administered 2018-05-04: 1000 mL via INTRAVENOUS

## 2018-05-04 MED ORDER — HYDROCODONE-ACETAMINOPHEN 7.5-325 MG/15ML PO SOLN: 10.0000 mL | Freq: Four times a day (QID) | ORAL | 0 refills | Status: AC | PRN

## 2018-05-04 MED ORDER — DOXYCYCLINE HYCLATE 100 MG PO CAPS: 100.0000 mg | ORAL_CAPSULE | Freq: Two times a day (BID) | ORAL | 0 refills | Status: DC

## 2018-05-04 NOTE — ED Notes (Signed)
Pt also c/o discharge

## 2018-05-04 NOTE — Discharge Instructions (Addendum)
Discontinue the amoxicillin. Take doxycycline as prescribed and complete the full course. Use liquid Motrin for pain.  New prescription given for liquid hydrocodone.  Do not drive or operate machinery while taking hydrocodone. Follow-up with ear nose and throat, referral given. Return to ER for worsening or concerning symptoms.

## 2018-05-04 NOTE — ED Provider Notes (Signed)
[facility] EMERGENCY DEPARTMENT Provider Note   CSN: [id number] Arrival date & time: 05/04/18  1906     History   Chief Complaint Chief Complaint  Patient presents with  . Sore Throat    HPI Stacey [name] is a 27 y.o. female.  27 year old female returns to emergency room with ongoing sore throat.  Patient was seen in the emergency room on November 12 for sore throat x1 week.  Patient's rapid strep test was negative.  CT soft tissue neck negative for abscess.  Patient was discharged home with lidocaine, naproxen, amoxicillin.  Patient reports ongoing pain in her throat, slightly more on the right, pain is worse with swallowing.  Denies difficulty breathing or fevers.  No other complaints or concerns.     Past Medical History:  Diagnosis Date  . GERD (gastroesophageal reflux disease)   . 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 Kidney infection    kidney infection 2012  . Migraine     Patient Active Problem List   Diagnosis Date Noted  . 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 Major depressive disorder, recurrent, severe without psychotic features (HCC)   . Smoker 07/04/2014    Past Surgical History:  Procedure Laterality Date  . NO PAST SURGERIES       OB History    Gravida  5   Para  2   Term  2   Preterm  0   AB  2   Living  2     SAB  0   TAB  2   Ectopic  0   Multiple  0   Live Births  2            Home Medications    Prior to Admission medications   Medication Sig Start Date End Date Taking? Authorizing Provider  lidocaine (XYLOCAINE) 2 % solution Use as directed 15 mLs in the mouth or throat as needed for mouth pain. 04/29/18  Yes Khatri, Hina, PA-C  naproxen (NAPROSYN) 500 MG tablet Take 1 tablet (500 mg total) by mouth 2 (two) times daily. 04/29/18  Yes Khatri, Hina, PA-C  norethindrone (MICRONOR,CAMILA,ERRIN) 0.35 MG tablet Take 1 tablet (0.35 mg total) by mouth daily. 06/12/17  Yes Adam Phenix, MD    doxycycline (VIBRAMYCIN) 100 MG capsule Take 1 capsule (100 mg total) by mouth 2 (two) times daily. 05/04/18   Jeannie Fend, PA-C  HYDROcodone-acetaminophen (HYCET) 7.5-325 mg/15 ml solution Take 10 mLs by mouth every 6 (six) hours as needed for up to 3 days for moderate pain. 05/04/18 05/07/18  Jeannie Fend, PA-C    Family History Family History  Problem Relation Age of Onset  . Hypertension Mother   . Asthma Brother   . Arthritis Maternal Grandmother   . Cancer Maternal Grandmother   . Arthritis Paternal Grandmother   . Anesthesia problems Neg Hx     Social History Social History   Tobacco Use  . Smoking status: Former Smoker    Packs/day: 1.00    Types: Cigars    Last attempt to quit: 01/24/2017    Years since quitting: 1.2  . Smokeless tobacco: Never Used  Substance Use Topics  . Alcohol use: No    Frequency: Never  . Drug use: No     Allergies   Patient has no known allergies.   Review of Systems Review of Systems  Constitutional: Negative for chills and fever.  HENT: Positive for sore  throat and voice change. Negative for congestion, sinus pressure, sinus pain, sneezing and trouble swallowing.   Respiratory: Negative for cough.   Gastrointestinal: Negative for nausea and vomiting.  Musculoskeletal: Negative for neck pain and neck stiffness.  Skin: Negative for rash and wound.  Allergic/Immunologic: Negative for immunocompromised state.  Neurological: Negative for headaches.  Hematological: Negative for adenopathy.  Psychiatric/Behavioral: Negative for confusion.  All other systems reviewed and are negative.    Physical Exam Updated Vital Signs BP 101/62   Pulse (!) 101   Temp 97.8 F (36.6 C) (Oral)   Resp 16   LMP 04/12/2018   SpO2 100%   Physical Exam  Constitutional: She is oriented to person, place, and time. She appears well-developed and well-nourished. No distress.  HENT:  Head: Normocephalic and atraumatic.  Right Ear: Tympanic  membrane and ear canal normal.  Left Ear: Tympanic membrane and ear canal normal.  Mouth/Throat: Uvula is midline and mucous membranes are normal. No uvula swelling. No tonsillar abscesses. Tonsils are 2+ on the right. Tonsils are 2+ on the left. Tonsillar exudate.    Neck: Neck supple.  Cardiovascular: Normal heart sounds. Tachycardia present.  No murmur heard. Pulmonary/Chest: Effort normal.  Lymphadenopathy:    She has cervical adenopathy.  Neurological: She is alert and oriented to person, place, and time.  Skin: Skin is warm and dry. No rash noted. She is not diaphoretic.  Psychiatric: She has a normal mood and affect. Her behavior is normal.  Nursing note and vitals reviewed.    ED Treatments / Results  Labs (all labs ordered are listed, but only abnormal results are displayed) Labs Reviewed  CBC WITH DIFFERENTIAL/PLATELET - Abnormal; Notable for the following components:      Result Value   Monocytes Absolute 1.4 (*)    All other components within normal limits  I-STAT CHEM 8, ED - Abnormal; Notable for the following components:   Potassium 3.4 (*)    All other components within normal limits  MONONUCLEOSIS SCREEN    EKG None  Radiology No results found.  Procedures Procedures (including critical care time)  Medications Ordered in ED Medications  sodium chloride 0.9 % bolus 1,000 mL (0 mLs Intravenous Stopped 05/04/18 2135)  HYDROcodone-acetaminophen (HYCET) 7.5-325 mg/15 ml solution 10 mL (10 mLs Oral Given 05/04/18 2031)     Initial Impression / Assessment and Plan / ED Course  I have reviewed the triage vital signs and the nursing notes.  Pertinent labs & imaging results that were available during my care of the patient were reviewed by me and considered in my medical decision making (see chart for details).  Clinical Course as of May 05 2143  Wynelle LinkSun May 04, 2018  [id number] 27 year old female returns with ongoing sore throat, no improvement on amoxicillin, no  relief with lidocaine or naproxen.  On exam patient has heavy exudate to both tonsils, no obvious abscess at this time.  Case discussed with Dr. Rubin PayorPickering who has seen the patient, recommends discontinue amoxicillin and switch to doxycycline also given prescription for liquid Norco for her pain.  Patient was given IV fluids, slight tachycardia at triage has improved post fluids.  Discussed results and plan of care with patient, Monospot negative, CBC and CMP unremarkable.  Patient is well-appearing, no respiratory distress, able to tolerate secretions and p.o. fluids.  Patient will be discharged home, referral given to ENT.   [LM]    Clinical Course User Index [LM] Jeannie FendMurphy, Lusero Nordlund A, PA-C   Final Clinical Impressions(s) /  ED Diagnoses   Final diagnoses:  Pharyngitis, unspecified etiology    ED Discharge Orders         Ordered    HYDROcodone-acetaminophen (HYCET) 7.5-325 mg/15 ml solution  Every 6 hours PRN     05/04/18 2120    doxycycline (VIBRAMYCIN) 100 MG capsule  2 times daily     05/04/18 2120           Jeannie Fend, PA-C 05/04/18 2144    Benjiman Core, MD 05/04/18 [phone]

## 2018-05-04 NOTE — ED Notes (Signed)
Pt stable, ambulatory, states understanding of discharge instructions, waiting for ride in lobby

## 2018-05-04 NOTE — ED Triage Notes (Signed)
Pt reports continued sore throat since last week. Seen here for the same without relief of her symptoms. Pt also c.o vomiting now

## 2018-06-11 ENCOUNTER — Encounter (HOSPITAL_COMMUNITY): Payer: Self-pay

## 2018-06-11 ENCOUNTER — Inpatient Hospital Stay (HOSPITAL_COMMUNITY)
Admission: AD | Admit: 2018-06-11 | Discharge: 2018-06-11 | Disposition: A | Discharge status: Home / Self Care | Payer: Medicaid Other | Source: Ambulatory Visit | Attending: Obstetrics & Gynecology | Admitting: Obstetrics & Gynecology

## 2018-06-11 DIAGNOSIS — N946 Dysmenorrhea, unspecified: Secondary | ICD-10-CM | POA: Insufficient documentation

## 2018-06-11 DIAGNOSIS — F17290 Nicotine dependence, other tobacco product, uncomplicated: Secondary | ICD-10-CM | POA: Insufficient documentation

## 2018-06-11 DIAGNOSIS — N939 Abnormal uterine and vaginal bleeding, unspecified: Secondary | ICD-10-CM | POA: Insufficient documentation

## 2018-06-11 LAB — CBC WITH DIFFERENTIAL/PLATELET
Basophils Absolute: 0 K/uL (ref 0.0–0.1)
Basophils Relative: 0 %
Eosinophils Absolute: 0.1 K/uL (ref 0.0–0.5)
Eosinophils Relative: 1 %
HCT: 36.3 % (ref 36.0–46.0)
Hemoglobin: 11.8 g/dL — ABNORMAL LOW (ref 12.0–15.0)
Lymphocytes Relative: 42 %
Lymphs Abs: 2.8 K/uL (ref 0.7–4.0)
MCH: 27.8 pg (ref 26.0–34.0)
MCHC: 32.5 g/dL (ref 30.0–36.0)
MCV: 85.4 fL (ref 80.0–100.0)
Monocytes Absolute: 0.2 K/uL (ref 0.1–1.0)
Monocytes Relative: 3 %
Neutro Abs: 3.5 K/uL (ref 1.7–7.7)
Neutrophils Relative %: 54 %
Platelets: 277 K/uL (ref 150–400)
RBC: 4.25 MIL/uL (ref 3.87–5.11)
RDW: 14.8 % (ref 11.5–15.5)
WBC: 6.6 K/uL (ref 4.0–10.5)
nRBC: 0 % (ref 0.0–0.2)

## 2018-06-11 LAB — URINALYSIS, ROUTINE W REFLEX MICROSCOPIC
Bacteria, UA: NONE SEEN
Bilirubin Urine: NEGATIVE
Glucose, UA: NEGATIVE mg/dL
Ketones, ur: NEGATIVE mg/dL
Leukocytes, UA: NEGATIVE
Nitrite: NEGATIVE
Protein, ur: NEGATIVE mg/dL
Specific Gravity, Urine: 1.023 (ref 1.005–1.030)
pH: 6 (ref 5.0–8.0)

## 2018-06-11 LAB — WET PREP, GENITAL
Clue Cells Wet Prep HPF POC: NONE SEEN
Sperm: NONE SEEN
Trich, Wet Prep: NONE SEEN
Yeast Wet Prep HPF POC: NONE SEEN

## 2018-06-11 LAB — POCT PREGNANCY, URINE: Preg Test, Ur: NEGATIVE

## 2018-06-11 MED ORDER — NORGESTIMATE-ETH ESTRADIOL 0.25-35 MG-MCG PO TABS: 1.0000 | ORAL_TABLET | Freq: Every day | ORAL | 11 refills | Status: DC

## 2018-06-11 MED ORDER — KETOROLAC TROMETHAMINE 60 MG/2ML IM SOLN
60.0000 mg | Freq: Once | INTRAMUSCULAR | Status: AC
  Administered 2018-06-11: 60 mg via INTRAMUSCULAR
  Filled 2018-06-11: qty 2

## 2018-06-11 NOTE — MAU Note (Signed)
Lower abdominal pain that started Friday-was supposed to start her period that day and has continued to have pain and bleeding since then.  Reports she has taken ibuprofen, naproxen, goodys powder without relief.  Reports the bleeding is heavier than her normal period and with more clots.  On BCPs-states she got back on BCPs 4 months ago.

## 2018-06-11 NOTE — MAU Provider Note (Signed)
History     CSN: [id number]  Arrival date and time: 06/11/18 2141   First Provider Initiated Contact with Patient 06/11/18 2239      Chief Complaint  Patient presents with  . Abdominal Pain  . Vaginal Bleeding   HPI [name] is a 27 y.o. [phone] non pregnant female who presents with vaginal bleeding and abdominal pain. She states she started a new birth control pill 4 months ago and her periods have been irregular since. She states she always has "bad cramps" with her periods but this is worse. She has tried tylenol with no relief. She rates the pain a 9/10.  OB History    Gravida  5   Para  2   Term  2   Preterm  0   AB  2   Living  2     SAB  0   TAB  2   Ectopic  0   Multiple  0   Live Births  2           Past Medical History:  Diagnosis Date  . GERD (gastroesophageal reflux disease)   . 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 Kidney infection    kidney infection 2012  . Migraine     Past Surgical History:  Procedure Laterality Date  . NO PAST SURGERIES      Family History  Problem Relation Age of Onset  . Hypertension Mother   . Asthma Brother   . Arthritis Maternal Grandmother   . Cancer Maternal Grandmother   . Arthritis Paternal Grandmother   . Anesthesia problems Neg Hx     Social History   Tobacco Use  . Smoking status: Former Smoker    Packs/day: 1.00    Types: Cigars    Last attempt to quit: 01/24/2017    Years since quitting: 1.3  . Smokeless tobacco: Never Used  Substance Use Topics  . Alcohol use: No    Frequency: Never  . Drug use: No    Allergies: No Known Allergies  Medications Prior to Admission  Medication Sig Dispense Refill Last Dose  . doxycycline (VIBRAMYCIN) 100 MG capsule Take 1 capsule (100 mg total) by mouth 2 (two) times daily. 20 capsule 0   . lidocaine (XYLOCAINE) 2 % solution Use as directed 15 mLs in the mouth or throat as needed for mouth pain. 100 mL 0 05/04/2018 at Unknown time  .  naproxen (NAPROSYN) 500 MG tablet Take 1 tablet (500 mg total) by mouth 2 (two) times daily. 30 tablet 0 05/04/2018 at Unknown time  . norethindrone (MICRONOR,CAMILA,ERRIN) 0.35 MG tablet Take 1 tablet (0.35 mg total) by mouth daily. 1 Package 11 05/04/2018 at Unknown time    Review of Systems  Constitutional: Negative.  Negative for fatigue and fever.  HENT: Negative.   Respiratory: Negative.  Negative for shortness of breath.   Cardiovascular: Negative.  Negative for chest pain.  Gastrointestinal: Positive for abdominal pain. Negative for constipation, diarrhea, nausea and vomiting.  Genitourinary: Positive for vaginal bleeding. Negative for dysuria and vaginal discharge.  Neurological: Negative.  Negative for dizziness and headaches.   Physical Exam   Blood pressure 139/87, pulse 94, temperature 99.1 F (37.3 C), resp. rate 19, height 5' (1.524 m), weight 66.8 kg, last menstrual period 05/07/2018, SpO2 100 %, currently breastfeeding.  Physical Exam  Nursing note and vitals reviewed. Constitutional: She is oriented to person, place, and time. She appears well-developed and well-nourished. No  distress.  HENT:  Head: Normocephalic.  Eyes: Pupils are equal, round, and reactive to light.  Cardiovascular: Normal rate, regular rhythm and normal heart sounds.  Respiratory: Effort normal and breath sounds normal. No respiratory distress.  GI: Soft. Bowel sounds are normal. She exhibits no distension. There is no abdominal tenderness.  Genitourinary:    Genitourinary Comments: Pelvic exam: Cervix pink, visually closed, without lesion, scant brown discharge, vaginal walls and external genitalia normal Bimanual exam: Cervix 0/long/high, firm, anterior, neg CMT, uterus nontender, nonenlarged, adnexa without tenderness, enlargement, or mass    Neurological: She is alert and oriented to person, place, and time.  Skin: Skin is warm and dry.  Psychiatric: She has a normal mood and affect. Her  behavior is normal. Judgment and thought content normal.    MAU Course  Procedures Results for orders placed or performed during the hospital encounter of 06/11/18 (from the past 24 hour(s))  Urinalysis, Routine w reflex microscopic     Status: Abnormal   Collection Time: 06/11/18 10:26 PM  Result Value Ref Range   Color, Urine YELLOW YELLOW   APPearance CLEAR CLEAR   Specific Gravity, Urine 1.023 1.005 - 1.030   pH 6.0 5.0 - 8.0   Glucose, UA NEGATIVE NEGATIVE mg/dL   Hgb urine dipstick LARGE (A) NEGATIVE   Bilirubin Urine NEGATIVE NEGATIVE   Ketones, ur NEGATIVE NEGATIVE mg/dL   Protein, ur NEGATIVE NEGATIVE mg/dL   Nitrite NEGATIVE NEGATIVE   Leukocytes, UA NEGATIVE NEGATIVE   RBC / HPF 0-5 0 - 5 RBC/hpf   WBC, UA 0-5 0 - 5 WBC/hpf   Bacteria, UA NONE SEEN NONE SEEN   Squamous Epithelial / LPF 0-5 0 - 5   Mucus PRESENT   Pregnancy, urine POC     Status: None   Collection Time: 06/11/18 10:28 PM  Result Value Ref Range   Preg Test, Ur NEGATIVE NEGATIVE  CBC with Differential/Platelet     Status: Abnormal   Collection Time: 06/11/18 10:52 PM  Result Value Ref Range   WBC 6.6 4.0 - 10.5 K/uL   RBC 4.25 3.87 - 5.11 MIL/uL   Hemoglobin 11.8 (L) 12.0 - 15.0 g/dL   HCT 28.4 13.2 - 44.0 %   MCV 85.4 80.0 - 100.0 fL   MCH 27.8 26.0 - 34.0 pg   MCHC 32.5 30.0 - 36.0 g/dL   RDW 10.2 72.5 - 36.6 %   Platelets 277 150 - 400 K/uL   nRBC 0.0 0.0 - 0.2 %   Neutrophils Relative % 54 %   Neutro Abs 3.5 1.7 - 7.7 K/uL   Lymphocytes Relative 42 %   Lymphs Abs 2.8 0.7 - 4.0 K/uL   Monocytes Relative 3 %   Monocytes Absolute 0.2 0.1 - 1.0 K/uL   Eosinophils Relative 1 %   Eosinophils Absolute 0.1 0.0 - 0.5 K/uL   Basophils Relative 0 %   Basophils Absolute 0.0 0.0 - 0.1 K/uL  Wet prep, genital     Status: Abnormal   Collection Time: 06/11/18 11:26 PM  Result Value Ref Range   Yeast Wet Prep HPF POC NONE SEEN NONE SEEN   Trich, Wet Prep NONE SEEN NONE SEEN   Clue Cells Wet Prep  HPF POC NONE SEEN NONE SEEN   WBC, Wet Prep HPF POC FEW (A) NONE SEEN   Sperm NONE SEEN    MDM UA, UPT Wet prep and gc/chlamydia CBC with Diff Toradol IM- patient reports relief from pain  Assessment and Plan  1. Dysmenorrhea    -Discharge home in stable condition -Rx for sprintec given to patient -Vaginal bleeding precautions discussed -[facility] Outpatient ultrasound ordered to rule out other gyn causes of pain. -Patient may return to MAU as needed or if her condition were to change or worsen   Rolm BookbinderCaroline M Neill CNM 06/11/2018, 10:39 PM

## 2018-06-11 NOTE — Discharge Instructions (Signed)
Dysmenorrhea    Dysmenorrhea refers to cramps caused by the muscles of the uterus tightening (contracting) during a menstrual period. Dysmenorrhea may be mild, or it may be severe enough to interfere with everyday activities for a few days each month.  Primary dysmenorrhea is menstrual cramps that last a couple of days when you start having menstrual periods or soon after. This often begins after a teenager starts having her period. As a woman gets older or has a baby, the cramps will usually lessen or disappear. Secondary dysmenorrhea begins later in life and is caused by a disorder in the reproductive system. It lasts longer, and it may cause more pain than primary dysmenorrhea. The pain may start before the period and last a few days after the period.  What are the causes?  Dysmenorrhea is usually caused by an underlying problem, such as:  · The tissue that lines the uterus (endometrium) growing outside of the uterus in other areas of the body (endometriosis).  · Endometrial tissue growing into the muscular walls of the uterus (adenomyosis).  · Blood vessels in the pelvis becoming filled with blood just before the menstrual period (pelvic congestive syndrome).  · Overgrowth of cells (polyps) in the endometrium or the lower part of the uterus (cervix).  · The uterus dropping down into the vagina (prolapse) due to stretched or weak muscles.  · Bladder problems, such as infection or inflammation.  · Intestinal problems, such as a tumor or irritable bowel syndrome.  · Cancer of the reproductive organs or bladder.  · A severely tipped uterus.  · A cervix that is closed or has a very small opening.  · Noncancerous (benign) tumors of the uterus (fibroids).  · Pelvic inflammatory disease (PID).  · Pelvic scarring (adhesions) from a previous surgery.  · An ovarian cyst.  · An IUD (intrauterine device).  What increases the risk?  You are more likely to develop this condition if:  · You are younger than age 30.  · You  started puberty early.  · You have irregular or heavy bleeding.  · You have never given birth.  · You have a family history of dysmenorrhea.  · You smoke.  What are the signs or symptoms?  Symptoms of this condition include:  · Cramping, throbbing pain, or a feeling of fullness in the lower abdomen.  · Lower back pain.  · Periods lasting for longer than 7 days.  · Headaches.  · Bloating.  · Fatigue.  · Nausea or vomiting.  · Diarrhea.  · Sweating or dizziness.  · Loose stools.  How is this diagnosed?  This condition may be diagnosed based on:  · Your symptoms.  · Your medical history.  · A physical exam.  · Blood tests.  · A Pap test. This is a test in which cells from the cervix are tested for signs of cancer or infection.  · A pregnancy test.  · Imaging tests, such as:  ? Ultrasound.  ? A procedure to remove and examine a sample of endometrial tissue (dilation and curettage, D&C).  ? A procedure to visually examine the inside of:  § The uterus (hysteroscopy).  § The abdomen or pelvis (laparoscopy).  § The bladder (cystoscopy).  § The intestine (colonoscopy).  § The stomach (gastroscopy).  ? X-rays.  ? CT scan.  ? MRI.  How is this treated?  Treatment depends on the cause of the dysmenorrhea. Treatment may include:  · Pain medicine prescribed by your health   care provider.  · Birth control pills that contain the hormone progesterone.  · An IUD that contains the hormone progesterone.  · Medicines to control bleeding.  · Hormone replacement therapy.  · NSAIDs. These may help to stop the production of hormones that cause cramps.  · Antidepressant medicines.  · Surgery to remove adhesions, endometriosis, ovarian cysts, fibroids, or the entire uterus (hysterectomy).  · Injections of progesterone to stop the menstrual period.  · A procedure to destroy the endometrium (endometrial ablation).  · A procedure to cut the nerves in the bottom of the spine (sacrum) that go to the reproductive organs (presacral neurectomy).  · A  procedure to apply an electric current to nerves in the sacrum (sacral nerve stimulation).  · Exercise and physical therapy.  · Meditation and yoga therapy.  · Acupuncture.  Work with your health care provider to determine what treatment or combination of treatments is best for you.  Follow these instructions at home:  Relieving pain and cramping  · Apply heat to your lower back or abdomen when you experience pain or cramps. Use the heat source that your health care provider recommends, such as a moist heat pack or a heating pad.  ? Place a towel between your skin and the heat source.  ? Leave the heat on for 20-30 minutes.  ? Remove the heat if your skin turns bright red. This is especially important if you are unable to feel pain, heat, or cold. You may have a greater risk of getting burned.  ? Do not sleep with a heating pad on.  · Do aerobic exercises, such as walking, swimming, or biking. This can help to relieve cramps.  · Massage your lower back or abdomen to help relieve pain.  General instructions  · Take over-the-counter and prescription medicines only as told by your health care provider.  · Do not drive or use heavy machinery while taking prescription pain medicine.  · Avoid alcohol and caffeine during and right before your menstrual period. These can make cramps worse.  · Do not use any products that contain nicotine or tobacco, such as cigarettes and e-cigarettes. If you need help quitting, ask your health care provider.  · Keep all follow-up visits as told by your health care provider. This is important.  Contact a health care provider if:  · You have pain that gets worse or does not get better with medicine.  · You have pain with sex.  · You develop nausea or vomiting with your period that is not controlled with medicine.  Get help right away if:  · You faint.  Summary  · Dysmenorrhea refers to cramps caused by the muscles of the uterus tightening (contracting) during a menstrual  period.  · Dysmenorrhea may be mild, or it may be severe enough to interfere with everyday activities for a few days each month.  · Treatment depends on the cause of the dysmenorrhea.  · Work with your health care provider to determine what treatment or combination of treatments is best for you.  This information is not intended to replace advice given to you by your health care provider. Make sure you discuss any questions you have with your health care provider.  Document Released: 06/04/2005 Document Revised: 07/07/2016 Document Reviewed: 07/07/2016  Elsevier Interactive Patient Education © 2019 [employer]

## 2018-06-13 LAB — GC/CHLAMYDIA PROBE AMP (~~LOC~~) NOT AT ARMC
CHLAMYDIA, DNA PROBE: NEGATIVE
Neisseria Gonorrhea: NEGATIVE

## 2018-06-18 NOTE — L&D Delivery Note (Signed)
[facility]N [facility] Delivery Note  Stacey [name] is a 28 y.o. [id number] s/p SVD at [redacted]w[redacted]d. She was admitted for SOL.   ROM: 16h 3m with clear fluid GBS Status: PCN given --/Positive (10/06 1037) Maximum Maternal Temperature:  Temp (24hrs), Avg:98.6 F (37 C), Min:97.8 F (36.6 C), Max:99.4 F (37.4 C)    Labor Progress: . Patient arrived at 7 cm dilation and was induced with pitocin.   Delivery Date/Time: 04/13/2019 at 0525 Delivery: Called to room and patient was complete and pushing. Head delivered in LOA  position. No nuchal cord present. Shoulder dystocia occurred lasting approximately 45 seconds.  Patient was placed in McRoberts position and then side-lying position and posterior shoulder was delivered.  Infant had poor tone and was slow to cry after stimulation so cord was cut by Tally Due and newborn was taken to the warmer to be attended by nursing staff.  After vigorous stimulation baby began to cry.  Cord blood drawn. Placenta delivered spontaneously with gentle cord traction. Fundus firm with massage and Pitocin. Labia, perineum, vagina, and cervix inspected with no lacerations or tears requiring repair.   Placenta: Spontaneous, intact, three-vessel cord Complications: Shoulder dystocia lasting approximately 45 seconds Lacerations: None EBL: 200 Analgesia: Epidural in place  Infant: APGAR (1 MIN):  5 APGAR (5 MINS):  9 APGAR (10 MINS):    Weight: Pending  Gifford Shave, MD  PGY-1, [facility]  04/13/2019 5:44 AM

## 2018-08-12 ENCOUNTER — Inpatient Hospital Stay (HOSPITAL_COMMUNITY)
Admission: EM | Admit: 2018-08-12 | Discharge: 2018-08-12 | Disposition: A | Discharge status: Home / Self Care | Payer: Medicaid Other | Attending: Obstetrics & Gynecology | Admitting: Obstetrics & Gynecology

## 2018-08-12 ENCOUNTER — Other Ambulatory Visit: Payer: Self-pay

## 2018-08-12 ENCOUNTER — Encounter (HOSPITAL_COMMUNITY): Payer: Self-pay | Admitting: *Deleted

## 2018-08-12 DIAGNOSIS — R109 Unspecified abdominal pain: Secondary | ICD-10-CM | POA: Insufficient documentation

## 2018-08-12 DIAGNOSIS — Z87891 Personal history of nicotine dependence: Secondary | ICD-10-CM | POA: Insufficient documentation

## 2018-08-12 DIAGNOSIS — Z3201 Encounter for pregnancy test, result positive: Secondary | ICD-10-CM | POA: Diagnosis not present

## 2018-08-12 DIAGNOSIS — O26899 Other specified pregnancy related conditions, unspecified trimester: Secondary | ICD-10-CM

## 2018-08-12 LAB — CBC WITH DIFFERENTIAL/PLATELET
Abs Immature Granulocytes: 0 10*3/uL (ref 0.00–0.07)
Basophils Absolute: 0 10*3/uL (ref 0.0–0.1)
Basophils Relative: 0 %
Eosinophils Absolute: 0.1 10*3/uL (ref 0.0–0.5)
Eosinophils Relative: 2 %
HEMATOCRIT: 37.8 % (ref 36.0–46.0)
Hemoglobin: 12.2 g/dL (ref 12.0–15.0)
Immature Granulocytes: 0 %
LYMPHS ABS: 1.6 10*3/uL (ref 0.7–4.0)
LYMPHS PCT: 33 %
MCH: 27.3 pg (ref 26.0–34.0)
MCHC: 32.3 g/dL (ref 30.0–36.0)
MCV: 84.6 fL (ref 80.0–100.0)
Monocytes Absolute: 0.3 10*3/uL (ref 0.1–1.0)
Monocytes Relative: 7 %
Neutro Abs: 2.9 10*3/uL (ref 1.7–7.7)
Neutrophils Relative %: 58 %
Platelets: 321 10*3/uL (ref 150–400)
RBC: 4.47 MIL/uL (ref 3.87–5.11)
RDW: 13.2 % (ref 11.5–15.5)
WBC: 4.9 10*3/uL (ref 4.0–10.5)
nRBC: 0 % (ref 0.0–0.2)

## 2018-08-12 LAB — BASIC METABOLIC PANEL
Anion gap: 5 (ref 5–15)
BUN: 7 mg/dL (ref 6–20)
CHLORIDE: 106 mmol/L (ref 98–111)
CO2: 24 mmol/L (ref 22–32)
Calcium: 9.3 mg/dL (ref 8.9–10.3)
Creatinine, Ser: 0.76 mg/dL (ref 0.44–1.00)
GFR calc Af Amer: >60 mL/min (ref >60)
GFR calc non Af Amer: >60 mL/min (ref >60)
Glucose, Bld: 95 mg/dL (ref 70–99)
Potassium: 3.8 mmol/L (ref 3.5–5.1)
Sodium: 135 mmol/L (ref 135–145)

## 2018-08-12 LAB — POC URINE PREG, ED: Preg Test, Ur: POSITIVE — AB

## 2018-08-12 LAB — URINALYSIS, ROUTINE W REFLEX MICROSCOPIC
Bilirubin Urine: NEGATIVE
Glucose, UA: NEGATIVE mg/dL
Hgb urine dipstick: NEGATIVE
KETONES UR: NEGATIVE mg/dL
Leukocytes,Ua: NEGATIVE
Nitrite: NEGATIVE
Protein, ur: NEGATIVE mg/dL
Specific Gravity, Urine: 1.021 (ref 1.005–1.030)
pH: 5 (ref 5.0–8.0)

## 2018-08-12 LAB — WET PREP, GENITAL
Clue Cells Wet Prep HPF POC: NONE SEEN
Sperm: NONE SEEN
Trich, Wet Prep: NONE SEEN
Yeast Wet Prep HPF POC: NONE SEEN

## 2018-08-12 LAB — HCG, QUANTITATIVE, PREGNANCY: hCG, Beta Chain, Quant, S: 637 m[IU]/mL — ABNORMAL HIGH (ref <5)

## 2018-08-12 MED ORDER — ACETAMINOPHEN 500 MG PO TABS
1000.0000 mg | ORAL_TABLET | Freq: Once | ORAL | Status: AC
  Administered 2018-08-12: 1000 mg via ORAL
  Filled 2018-08-12: qty 2

## 2018-08-12 NOTE — MAU Note (Signed)
Preparing to call pt to rm, plan discussed with provider- [place] is ordered.  Adm clerk came back, pt wanting/needing to leave, she went back to tell pt, pt states she needs to pick up her dad.  Offered she may return at any time

## 2018-08-12 NOTE — ED Triage Notes (Signed)
PT reports lower back pain with nausea and lower ABD pain. Pt reports taking a home Preg. Test  With faint color change .

## 2018-08-12 NOTE — ED Provider Notes (Signed)
[name] EMERGENCY DEPARTMENT Provider Note   CSN: [id number] Arrival date & time: 08/12/18  1309    History   Chief Complaint Chief Complaint  Patient presents with  . Possible Pregnancy    HPI Stacey [name] is a 28 y.o. female.     HPI   Pt is a 28 y/o female with a h/o GERD, HSV-2, kidney infection, migraines, who presents to the ED today for evaluation of possible pregnancy. States she took a pregnancy test at home and it was faintly positive. States she also developed left lower back pain and suprapubic abd pain over the last few days as well. States pain is intermittent. Pain is sharp. Episodes last a minute at a time and resolves on its own. Back pain rated as severe. Abd pain rated as moderate. Reports frequent urination, but deniesdysuria. She reports white discharge for the last week. Denies vaginal bleeding. She has some concerns for possible STD.   LMP 06/23/18.  Past Medical History:  Diagnosis Date  . GERD (gastroesophageal reflux disease)   . 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 Kidney infection    kidney infection 2012  . Migraine     Patient Active Problem List   Diagnosis Date Noted  . 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 Major depressive disorder, recurrent, severe without psychotic features (HCC)   . Smoker 07/04/2014    Past Surgical History:  Procedure Laterality Date  . NO PAST SURGERIES       OB History    Gravida  5   Para  2   Term  2   Preterm  0   AB  2   Living  2     SAB  0   TAB  2   Ectopic  0   Multiple  0   Live Births  2            Home Medications    Prior to Admission medications   Medication Sig Start Date End Date Taking? Authorizing Provider  lidocaine (XYLOCAINE) 2 % solution Use as directed 15 mLs in the mouth or throat as needed for mouth pain. 04/29/18   Khatri, Hina, PA-C  naproxen (NAPROSYN) 500 MG tablet Take 1 tablet (500 mg total) by mouth 2 (two) times  daily. 04/29/18   Khatri, Hina, PA-C  norgestimate-ethinyl estradiol (ORTHO-CYCLEN,SPRINTEC,PREVIFEM) 0.25-35 MG-MCG tablet Take 1 tablet by mouth daily. 06/11/18   Rolm Bookbinder, CNM    Family History Family History  Problem Relation Age of Onset  . Hypertension Mother   . Asthma Brother   . Arthritis Maternal Grandmother   . Cancer Maternal Grandmother   . Arthritis Paternal Grandmother   . Anesthesia problems Neg Hx     Social History Social History   Tobacco Use  . Smoking status: Former Smoker    Packs/day: 1.00    Types: Cigars    Last attempt to quit: 01/24/2017    Years since quitting: 1.5  . Smokeless tobacco: Never Used  Substance Use Topics  . Alcohol use: No    Frequency: Never  . Drug use: No     Allergies   Patient has no known allergies.   Review of Systems Review of Systems  Constitutional: Negative for chills and fever.  HENT: Negative for ear pain and sore throat.   Eyes: Negative for visual disturbance.  Respiratory: Negative for cough and shortness of breath.   Cardiovascular: Negative for chest  pain.  Gastrointestinal: Positive for abdominal pain. Negative for constipation, diarrhea, nausea and vomiting.  Genitourinary: Positive for frequency, pelvic pain and vaginal discharge. Negative for dysuria, hematuria and vaginal bleeding.  Musculoskeletal: Positive for back pain.  Skin: Negative for rash.  Neurological: Negative for weakness, numbness and headaches.  All other systems reviewed and are negative.   Physical Exam Updated Vital Signs BP 105/66 (BP Location: Right Arm)   Pulse 94   Temp 98.6 F (37 C) (Oral)   Resp 16   Ht 5' (1.524 m)   Wt 66.2 kg   LMP 06/23/2018   SpO2 100%   BMI 28.51 kg/m   Physical Exam Vitals signs and nursing note reviewed.  Constitutional:      General: She is not in acute distress.    Appearance: She is well-developed.  HENT:     Head: Normocephalic and atraumatic.     Mouth/Throat:      Mouth: Mucous membranes are moist.  Eyes:     Conjunctiva/sclera: Conjunctivae normal.  Neck:     Musculoskeletal: Neck supple.  Cardiovascular:     Rate and Rhythm: Normal rate and regular rhythm.     Heart sounds: Normal heart sounds. No murmur.  Pulmonary:     Effort: Pulmonary effort is normal. No respiratory distress.     Breath sounds: Normal breath sounds. No wheezing or rhonchi.  Abdominal:     General: Bowel sounds are normal.     Palpations: Abdomen is soft.     Tenderness: There is abdominal tenderness (left suprapubic TTP and mild left flank TTP ).  Genitourinary:    Comments: Exam performed by Karrie Meres,  exam chaperoned Date: 08/12/2018 Pelvic exam: normal external genitalia without evidence of trauma. VULVA: normal appearing vulva with no masses, tenderness or lesion. VAGINA: normal appearing vagina with normal color and discharge, no lesions. CERVIX: normal appearing cervix without lesions, cervical motion tenderness absent, cervical os closed with out purulent discharge; vaginal discharge - scant white vaginal discharge present, cervix mildly erythematous, Wet prep and DNA probe for chlamydia and GC obtained.   ADNEXA: normal adnexa in size, nontender and no masses UTERUS: uterus is normal size, shape, consistency and nontender.  Musculoskeletal:     Comments: No midline TTP to the thoracic or lumbar spine  Skin:    General: Skin is warm and dry.  Neurological:     Mental Status: She is alert.    ED Treatments / Results  Labs (all labs ordered are listed, but only abnormal results are displayed) Labs Reviewed  WET PREP, GENITAL - Abnormal; Notable for the following components:      Result Value   WBC, Wet Prep HPF POC MANY (*)    All other components within normal limits  POC URINE PREG, ED - Abnormal; Notable for the following components:   Preg Test, Ur POSITIVE (*)    All other components within normal limits  CBC WITH DIFFERENTIAL/PLATELET    BASIC METABOLIC PANEL  URINALYSIS, ROUTINE W REFLEX MICROSCOPIC  HCG, QUANTITATIVE, PREGNANCY  GC/CHLAMYDIA PROBE AMP ([place]) NOT AT [facility]    EKG None  Radiology No results found.  Procedures Procedures (including critical care time)  Medications Ordered in ED Medications  acetaminophen (TYLENOL) tablet 1,000 mg (1,000 mg Oral Given 08/12/18 1348)     Initial Impression / Assessment and Plan / ED Course  I have reviewed the triage vital signs and the nursing notes.  Pertinent labs & imaging results that were  available during my care of the patient were reviewed by me and considered in my medical decision making (see chart for details).       Final Clinical Impressions(s) / ED Diagnoses   Final diagnoses:  Positive pregnancy test   Patient presenting for evaluation of abdominal pain and possible pregnancy.  Afebrile in the ED.  Otherwise normal vitals.  Does have some mild suprapubic tenderness, left side of the abdomen with left CVA tenderness.  poc urine pregnancy test confirms positive pregnancy.  Does have some mild abd ttp on exam. Mild amount of discharge noted one pelvic exam. No significant adnexal ttp. No CMT. Wet prep and gc/chlamydia obtained. Cbc, bmp and ua collected. Pelvic us ordered to initiate workup to r/o ectopic.   Discussed case with Ginger, charge nurse at [facility]U who advises to transfer patient to [facility].  Nursing staff informed and patient transferred to MAU.  ED Discharge Orders    None       Rayne DuCouture, Marcos Peloso S, PA-C 08/12/18 1516    Loren RacerYelverton, David, MD 08/16/18 1555

## 2018-08-14 LAB — GC/CHLAMYDIA PROBE AMP (~~LOC~~) NOT AT ARMC
Chlamydia: NEGATIVE
Neisseria Gonorrhea: NEGATIVE

## 2018-09-03 ENCOUNTER — Other Ambulatory Visit: Payer: Self-pay

## 2018-09-03 ENCOUNTER — Encounter: Payer: Self-pay | Admitting: Obstetrics

## 2018-09-03 ENCOUNTER — Other Ambulatory Visit (HOSPITAL_COMMUNITY)
Admission: RE | Admit: 2018-09-03 | Discharge: 2018-09-03 | Disposition: A | Discharge status: Home / Self Care | Payer: Medicaid Other | Source: Ambulatory Visit | Attending: Obstetrics | Admitting: Obstetrics

## 2018-09-03 ENCOUNTER — Ambulatory Visit (INDEPENDENT_AMBULATORY_CARE_PROVIDER_SITE_OTHER): Payer: Medicaid Other | Admitting: Obstetrics

## 2018-09-03 VITALS — BP 113/70 | HR 86 | Wt 155.0 lb

## 2018-09-03 DIAGNOSIS — O09899 Supervision of other high risk pregnancies, unspecified trimester: Secondary | ICD-10-CM

## 2018-09-03 DIAGNOSIS — Z3400 Encounter for supervision of normal first pregnancy, unspecified trimester: Secondary | ICD-10-CM

## 2018-09-03 DIAGNOSIS — O09299 Supervision of pregnancy with other poor reproductive or obstetric history, unspecified trimester: Secondary | ICD-10-CM

## 2018-09-03 DIAGNOSIS — O219 Vomiting of pregnancy, unspecified: Secondary | ICD-10-CM

## 2018-09-03 DIAGNOSIS — N760 Acute vaginitis: Secondary | ICD-10-CM

## 2018-09-03 DIAGNOSIS — Z3490 Encounter for supervision of normal pregnancy, unspecified, unspecified trimester: Secondary | ICD-10-CM

## 2018-09-03 DIAGNOSIS — Z3481 Encounter for supervision of other normal pregnancy, first trimester: Secondary | ICD-10-CM | POA: Diagnosis not present

## 2018-09-03 DIAGNOSIS — O98813 Other maternal infectious and parasitic diseases complicating pregnancy, third trimester: Secondary | ICD-10-CM

## 2018-09-03 DIAGNOSIS — B9689 Other specified bacterial agents as the cause of diseases classified elsewhere: Secondary | ICD-10-CM

## 2018-09-03 DIAGNOSIS — O09293 Supervision of pregnancy with other poor reproductive or obstetric history, third trimester: Secondary | ICD-10-CM

## 2018-09-03 DIAGNOSIS — Z3480 Encounter for supervision of other normal pregnancy, unspecified trimester: Secondary | ICD-10-CM | POA: Insufficient documentation

## 2018-09-03 DIAGNOSIS — Z87440 Personal history of urinary (tract) infections: Secondary | ICD-10-CM

## 2018-09-03 MED ORDER — VITAFOL GUMMIES 3.33-0.333-34.8 MG PO CHEW: 3.0000 | CHEWABLE_TABLET | Freq: Every day | ORAL | 11 refills | Status: DC

## 2018-09-03 MED ORDER — DOXYLAMINE-PYRIDOXINE 10-10 MG PO TBEC: DELAYED_RELEASE_TABLET | ORAL | 5 refills | Status: DC

## 2018-09-03 NOTE — Progress Notes (Signed)
NOB Planned:No  Last pap: per pt last yr not with our office 10/26/2016 in or office  WNL  Genetic Screening: Desires   CC: pt c/o sharp pain that comes and goes and notes increase in vaginal discharge. No odor no itching  Pt notes nausea and vomiting

## 2018-09-03 NOTE — Progress Notes (Signed)
Subjective:    Stacey [name] is being seen today for her first obstetrical visit.  This is not a planned pregnancy. She is at [redacted]w[redacted]d gestation. Her obstetrical history is significant for group B strep colonizer and pre-eclampsia. Relationship with FOB: significant other, not living together. Patient does intend to breast feed. Pregnancy history fully reviewed.  The information documented in the HPI was reviewed and verified.  Menstrual History: OB History    Gravida  6   Para  2   Term  2   Preterm  0   AB  2   Living  2     SAB  0   TAB  2   Ectopic  0   Multiple  0   Live Births  2            Patient's last menstrual period was 06/23/2018.    Past Medical History:  Diagnosis Date  . GERD (gastroesophageal reflux disease)   . 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 Kidney infection    kidney infection 2012  . Migraine     Past Surgical History:  Procedure Laterality Date  . NO PAST SURGERIES      (Not in a hospital admission)  No Known Allergies  Social History   Tobacco Use  . Smoking status: Current Some Day Smoker    Packs/day: 1.00    Types: Cigars    Last attempt to quit: 01/24/2017    Years since quitting: 1.6  . Smokeless tobacco: Never Used  . Tobacco comment: black and mild   Substance Use Topics  . Alcohol use: No    Frequency: Never    Family History  Problem Relation Age of Onset  . Hypertension Mother   . Asthma Brother   . Arthritis Maternal Grandmother   . Cancer Maternal Grandmother   . Arthritis Paternal Grandmother   . Anesthesia problems Neg Hx      Review of Systems Constitutional: negative for weight loss Gastrointestinal: negative for vomiting Genitourinary:negative for genital lesions and vaginal discharge and dysuria Musculoskeletal:negative for back pain Behavioral/Psych: negative for abusive relationship, depression, illegal drug usage and tobacco use    Objective:    BP 113/70   Pulse 86   Wt 155 lb  (70.3 kg)   LMP 06/23/2018   BMI 30.27 kg/m  General Appearance:    Alert, cooperative, no distress, appears stated age  Head:    Normocephalic, without obvious abnormality, atraumatic  Eyes:    PERRL, conjunctiva/corneas clear, EOM's intact, fundi    benign, both eyes  Ears:    Normal TM's and external ear canals, both ears  Nose:   Nares normal, septum midline, mucosa normal, no drainage    or sinus tenderness  Throat:   Lips, mucosa, and tongue normal; teeth and gums normal  Neck:   Supple, symmetrical, trachea midline, no adenopathy;    thyroid:  no enlargement/tenderness/nodules; no carotid   bruit or JVD  Back:     Symmetric, no curvature, ROM normal, no CVA tenderness  Lungs:     Clear to auscultation bilaterally, respirations unlabored  Chest Wall:    No tenderness or deformity   Heart:    Regular rate and rhythm, S1 and S2 normal, no murmur, rub   or gallop  Breast Exam:    No tenderness, masses, or nipple abnormality  Abdomen:     Soft, non-tender, bowel sounds active all four quadrants,    no  masses, no organomegaly  Genitalia:    Normal female without lesion, discharge or tenderness  Extremities:   Extremities normal, atraumatic, no cyanosis or edema  Pulses:   2+ and symmetric all extremities  Skin:   Skin color, texture, turgor normal, no rashes or lesions  Lymph nodes:   Cervical, supraclavicular, and axillary nodes normal  Neurologic:   CNII-XII intact, normal strength, sensation and reflexes    throughout      Lab Review Urine pregnancy test Labs reviewed yes Radiologic studies reviewed no  Assessment:    Pregnancy at [redacted]w[redacted]d weeks    Plan:     1. Encounter for supervision of normal pregnancy, antepartum, unspecified gravidity Rx: - Obstetric Panel, Including HIV - Culture, OB Urine - Genetic Screening - Hemoglobinopathy evaluation - Cystic Fibrosis Mutation 97 - SMN1 COPY NUMBER ANALYSIS (SMA Carrier Screen) - Cervicovaginal ancillary only( [facility]) - Cytology - PAP( [place]) - Enroll Patient in Babyscripts - Prenatal Vit-Fe Phos-FA-Omega (VITAFOL GUMMIES) 3.33-0.333-34.8 MG CHEW; Chew 3 tablets by mouth daily before breakfast.  Dispense: 90 tablet; Refill: 11  2. History of pre-eclampsia in prior pregnancy, currently pregnant  3. History of GBS (group B streptococcus) UTI, currently pregnant  4. Nausea and vomiting during pregnancy prior to [redacted] weeks gestation Rx: - Doxylamine-Pyridoxine (DICLEGIS) 10-10 MG TBEC; 1 tab in AM, 1 tab mid afternoon 2 tabs at bedtime. Max dose 4 tabs daily.  Dispense: 100 tablet; Refill: 5   Prenatal vitamins.  Counseling provided regarding continued use of seat belts, cessation of alcohol consumption, smoking or use of illicit drugs; infection precautions i.e., influenza/TDAP immunizations, toxoplasmosis,CMV, parvovirus, listeria and varicella; workplace safety, exercise during pregnancy; routine dental care, safe medications, sexual activity, hot tubs, saunas, pools, travel, caffeine use, fish and methlymercury, potential toxins, hair treatments, varicose veins Weight gain recommendations per IOM guidelines reviewed: underweight/BMI< 18.5--> gain 28 - 40 lbs; normal weight/BMI 18.5 - 24.9--> gain 25 - 35 lbs; overweight/BMI 25 - 29.9--> gain 15 - 25 lbs; obese/BMI >30->gain  11 - 20 lbs Problem list reviewed and updated. FIRST/CF mutation testing/NIPT/QUAD SCREEN/fragile X/Ashkenazi Jewish population testing/Spinal muscular atrophy discussed: requested. Role of ultrasound in pregnancy discussed; fetal survey: requested. Amniocentesis discussed: not indicated.  No orders of the defined types were placed in this encounter.  Orders Placed This Encounter  Procedures  . Culture, OB Urine  . Obstetric Panel, Including HIV  . Genetic Screening    PANORAMA  . Hemoglobinopathy evaluation  . Cystic Fibrosis Mutation 97  . SMN1 COPY NUMBER ANALYSIS (SMA Carrier Screen)    Follow up in 5  weeks. 50% of 20 min visit spent on counseling and coordination of care.     Brock Bad MD 09-03-2018

## 2018-09-04 LAB — CYTOLOGY - PAP: Diagnosis: NEGATIVE

## 2018-09-05 ENCOUNTER — Telehealth: Payer: Self-pay

## 2018-09-05 ENCOUNTER — Other Ambulatory Visit: Payer: Self-pay | Admitting: Obstetrics

## 2018-09-05 DIAGNOSIS — B9689 Other specified bacterial agents as the cause of diseases classified elsewhere: Secondary | ICD-10-CM

## 2018-09-05 DIAGNOSIS — N760 Acute vaginitis: Principal | ICD-10-CM

## 2018-09-05 LAB — CERVICOVAGINAL ANCILLARY ONLY
Bacterial vaginitis: POSITIVE — AB
Candida vaginitis: NEGATIVE
Chlamydia: NEGATIVE
Neisseria Gonorrhea: NEGATIVE
Trichomonas: NEGATIVE

## 2018-09-05 LAB — URINE CULTURE, OB REFLEX: ORGANISM ID, BACTERIA: NO GROWTH

## 2018-09-05 LAB — CULTURE, OB URINE

## 2018-09-05 MED ORDER — TINIDAZOLE 500 MG PO TABS: 1000.0000 mg | ORAL_TABLET | Freq: Every day | ORAL | 2 refills | Status: DC

## 2018-09-05 NOTE — Telephone Encounter (Signed)
Returned call and advised of results that are currently back.

## 2018-09-12 LAB — OBSTETRIC PANEL, INCLUDING HIV
Antibody Screen: NEGATIVE
BASOS: 0 %
Basophils Absolute: 0 10*3/uL (ref 0.0–0.2)
EOS (ABSOLUTE): 0.1 10*3/uL (ref 0.0–0.4)
Eos: 2 %
HEMATOCRIT: 35.6 % (ref 34.0–46.6)
HEMOGLOBIN: 11.6 g/dL (ref 11.1–15.9)
HIV Screen 4th Generation wRfx: NONREACTIVE
Hepatitis B Surface Ag: NEGATIVE
Immature Grans (Abs): 0 10*3/uL (ref 0.0–0.1)
Immature Granulocytes: 0 %
Lymphocytes Absolute: 1.6 10*3/uL (ref 0.7–3.1)
Lymphs: 22 %
MCH: 28 pg (ref 26.6–33.0)
MCHC: 32.6 g/dL (ref 31.5–35.7)
MCV: 86 fL (ref 79–97)
MONOCYTES: 7 %
Monocytes Absolute: 0.5 10*3/uL (ref 0.1–0.9)
Neutrophils Absolute: 5.1 10*3/uL (ref 1.4–7.0)
Neutrophils: 69 %
Platelets: 334 10*3/uL (ref 150–450)
RBC: 4.15 x10E6/uL (ref 3.77–5.28)
RDW: 13.1 % (ref 11.7–15.4)
RH TYPE: POSITIVE
RPR Ser Ql: NONREACTIVE
Rubella Antibodies, IGG: 1.58 index (ref >0.99)
WBC: 7.4 10*3/uL (ref 3.4–10.8)

## 2018-09-12 LAB — HEMOGLOBINOPATHY EVALUATION
HEMOGLOBIN A2 QUANTITATION: 2.2 % (ref 1.8–3.2)
HGB C: 0 %
HGB S: 0 %
HGB VARIANT: 0 %
Hemoglobin F Quantitation: 0 % (ref 0.0–2.0)
Hgb A: 97.8 % (ref 96.4–98.8)

## 2018-09-12 LAB — SMN1 COPY NUMBER ANALYSIS (SMA CARRIER SCREENING)

## 2018-09-12 LAB — CYSTIC FIBROSIS MUTATION 97: Interpretation: NOT DETECTED

## 2018-09-15 DIAGNOSIS — O289 Unspecified abnormal findings on antenatal screening of mother: Secondary | ICD-10-CM

## 2018-09-16 ENCOUNTER — Encounter: Payer: Self-pay | Admitting: Obstetrics

## 2018-09-16 ENCOUNTER — Other Ambulatory Visit: Payer: Self-pay

## 2018-09-16 DIAGNOSIS — Z148 Genetic carrier of other disease: Secondary | ICD-10-CM

## 2018-09-16 NOTE — Progress Notes (Signed)
Genetic referral made

## 2018-09-24 ENCOUNTER — Encounter: Payer: Self-pay | Admitting: *Deleted

## 2018-09-24 ENCOUNTER — Other Ambulatory Visit: Payer: Self-pay | Admitting: Obstetrics

## 2018-09-24 NOTE — Telephone Encounter (Signed)
Called patient to give results of abnormal Panorama, and the need for referral to MFM.  All questions were answered to patient's satisfaction.

## 2018-10-03 ENCOUNTER — Encounter: Payer: Self-pay | Admitting: Obstetrics

## 2018-10-04 ENCOUNTER — Other Ambulatory Visit: Payer: Self-pay

## 2018-10-04 ENCOUNTER — Inpatient Hospital Stay (HOSPITAL_COMMUNITY)
Admission: AD | Admit: 2018-10-04 | Discharge: 2018-10-05 | Disposition: A | Discharge status: Home / Self Care | Payer: Medicaid Other | Attending: Obstetrics and Gynecology | Admitting: Obstetrics and Gynecology

## 2018-10-04 ENCOUNTER — Encounter (HOSPITAL_COMMUNITY): Payer: Self-pay | Admitting: *Deleted

## 2018-10-04 DIAGNOSIS — O26892 Other specified pregnancy related conditions, second trimester: Secondary | ICD-10-CM

## 2018-10-04 DIAGNOSIS — N760 Acute vaginitis: Secondary | ICD-10-CM

## 2018-10-04 DIAGNOSIS — F17290 Nicotine dependence, other tobacco product, uncomplicated: Secondary | ICD-10-CM | POA: Diagnosis not present

## 2018-10-04 DIAGNOSIS — Z3A14 14 weeks gestation of pregnancy: Secondary | ICD-10-CM | POA: Diagnosis not present

## 2018-10-04 DIAGNOSIS — Z3400 Encounter for supervision of normal first pregnancy, unspecified trimester: Secondary | ICD-10-CM

## 2018-10-04 DIAGNOSIS — B9689 Other specified bacterial agents as the cause of diseases classified elsewhere: Secondary | ICD-10-CM | POA: Diagnosis not present

## 2018-10-04 DIAGNOSIS — O210 Mild hyperemesis gravidarum: Secondary | ICD-10-CM | POA: Insufficient documentation

## 2018-10-04 LAB — URINALYSIS, ROUTINE W REFLEX MICROSCOPIC
Bilirubin Urine: NEGATIVE
Glucose, UA: NEGATIVE mg/dL
Hgb urine dipstick: NEGATIVE
Ketones, ur: NEGATIVE mg/dL
Nitrite: NEGATIVE
Protein, ur: NEGATIVE mg/dL
Specific Gravity, Urine: 1.028 (ref 1.005–1.030)
pH: 6 (ref 5.0–8.0)

## 2018-10-04 NOTE — MAU Provider Note (Addendum)
History   Patient Stacey SpatesDanasia J Kistler is a 28 y.o. [id number] At 666w5d here with complaints of vaginal discharge, itching and right sided abdominal pain and suprapubic pain. She denies bleeding, LOF. She had a normal BM today; she denies pain with urination or pain with intercourse.  She has occasional nausea and vomiting; she throws up once a day. She does not think this pain is related to her vomiting. She has been taking Diclegis but it does not work. She was given RX for her BV in March but she has not been able to take it since it makes her throw up.   The patient is also upset that she has a virtual visit on Tuesday; she also believes that her dating is wrong. She would like an US. She believes that her period in January was "not right" and she thinks she is earlier in her pregnancy. She has gotten notification from [place] that she is an [employer] production pl[employer] [place] carrier as well as at risk for multiple aneuplodies but she has not had an US and thinks that her dating is wrong. She is upset about this.  CSN: [id number]  Arrival date and time: 10/04/18 2248   First Provider Initiated Contact with Patient 10/04/18 2336      No chief complaint on file.  Vaginal Discharge  The patient's primary symptoms include genital itching, a genital odor and vaginal discharge. This is a new problem. The current episode started in the past 7 days. The problem occurs constantly. Associated symptoms include abdominal pain. Pertinent negatives include no constipation, diarrhea, flank pain or urgency. The vaginal discharge was yellow. There has been no bleeding.  Abdominal Pain  This is a new problem. The problem occurs constantly. The pain is at a severity of 7/10. The quality of the pain is cramping. Pertinent negatives include no constipation or diarrhea. Nothing aggravates the pain. The pain is relieved by movement and certain positions. She has tried acetaminophen for the symptoms. The treatment provided no relief.    OB  History    Gravida  6   Para  2   Term  2   Preterm  0   AB  2   Living  2     SAB  0   TAB  2   Ectopic  0   Multiple  0   Live Births  2           Past Medical History:  Diagnosis Date  . GERD (gastroesophageal reflux disease)   . 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 Kidney infection    kidney infection 2012  . Migraine     Past Surgical History:  Procedure Laterality Date  . NO PAST SURGERIES      Family History  Problem Relation Age of Onset  . Hypertension Mother   . Asthma Brother   . Arthritis Maternal Grandmother   . Cancer Maternal Grandmother   . Arthritis Paternal Grandmother   . Anesthesia problems Neg Hx     Social History   Tobacco Use  . Smoking status: Current Some Day Smoker    Packs/day: 1.00    Types: Cigars    Last attempt to quit: 01/24/2017    Years since quitting: 1.6  . Smokeless tobacco: Never Used  . Tobacco comment: black and mild   Substance Use Topics  . Alcohol use: No    Frequency: Never  . Drug use: No    Allergies: No Known  Allergies  Medications Prior to Admission  Medication Sig Dispense Refill Last Dose  . Doxylamine-Pyridoxine (DICLEGIS) 10-10 MG TBEC 1 tab in AM, 1 tab mid afternoon 2 tabs at bedtime. Max dose 4 tabs daily. 100 tablet 5 10/03/2018 at Unknown time  . Prenatal Vit-Fe Phos-FA-Omega (VITAFOL GUMMIES) 3.33-0.333-34.8 MG CHEW Chew 3 tablets by mouth daily before breakfast. 90 tablet 11 10/04/2018 at Unknown time  . lidocaine (XYLOCAINE) 2 % solution Use as directed 15 mLs in the mouth or throat as needed for mouth pain. (Patient not taking: Reported on 09/03/2018) 100 mL 0 Not Taking  . naproxen (NAPROSYN) 500 MG tablet Take 1 tablet (500 mg total) by mouth 2 (two) times daily. (Patient not taking: Reported on 09/03/2018) 30 tablet 0 Not Taking  . norgestimate-ethinyl estradiol (ORTHO-CYCLEN,SPRINTEC,PREVIFEM) 0.25-35 MG-MCG tablet Take 1 tablet by mouth daily. (Patient not taking: Reported on  09/03/2018) 1 Package 11 Not Taking  . tinidazole (TINDAMAX) 500 MG tablet Take 2 tablets (1,000 mg total) by mouth daily with breakfast. 10 tablet 2     Review of Systems  Constitutional: Negative.   HENT: Negative.   Respiratory: Negative.   Gastrointestinal: Positive for abdominal pain. Negative for constipation and diarrhea.  Genitourinary: Positive for vaginal discharge. Negative for flank pain and urgency.   Physical Exam   Temperature 98.9 F (37.2 C), resp. rate 18, height 5' (1.524 m), weight 71.2 kg, last menstrual period 06/23/2018, currently breastfeeding.  Physical Exam  Constitutional: She is oriented to person, place, and time. She appears well-developed.  HENT:  Head: Normocephalic.  Eyes: Pupils are equal, round, and reactive to light.  Neck: Normal range of motion.  Cardiovascular: Normal rate.  Respiratory: Effort normal.  GI: Soft.  Genitourinary:    Vagina normal.     Genitourinary Comments: NEFG; vaginal walls are pink; mucous discharge in the vagina; no odor. No blood, slight suprapubic tenderness, no CMT.    Musculoskeletal: Normal range of motion.  Neurological: She is alert and oriented to person, place, and time.  Skin: Skin is warm and dry.    MAU Course  Procedures  MDM -CMP: normal  -GC: pending -Wet prep: positive for BV.  -UA: small leuks, rare bacteria. Will not send for culture -HR is 164 by doppler.  Assessment and Plan   1. Bacterial vaginosis   2. Supervision of normal first pregnancy, antepartum   3. Morning sickness    2.Will start on baby ASA as patient has history of pre-e 3. RX for Reglan and phenergan given 4. Patient requests metrogel as she does not like tindamax or flagyl; she would like a cream. Explained that it may not be covered by Medicaid; she would like to try it anyways.  5. Patient will keep genetic screening appt this week, as well as visit with Dr. Clearance Coots. Encouraged her to discuss the results of her genetic  testing with the counselor; she may be a candidate for an [place] for dating but that decision will be made with her regular OB doctor.  6. Return precautions reviewed; all questions answered.   Charlesetta Garibaldi Kooistra 10/04/2018, 11:40 PM

## 2018-10-04 NOTE — MAU Note (Signed)
Having constant cramping in lower abd since last night. Sharp pain on occ RLQ. Some vag d/c with odor that is yellow. No vag bleeding

## 2018-10-05 DIAGNOSIS — B9689 Other specified bacterial agents as the cause of diseases classified elsewhere: Secondary | ICD-10-CM | POA: Diagnosis present

## 2018-10-05 DIAGNOSIS — N760 Acute vaginitis: Secondary | ICD-10-CM

## 2018-10-05 LAB — COMPREHENSIVE METABOLIC PANEL
ALT: 19 U/L (ref 0–44)
AST: 19 U/L (ref 15–41)
Albumin: 3.7 g/dL (ref 3.5–5.0)
Alkaline Phosphatase: 48 U/L (ref 38–126)
Anion gap: 11 (ref 5–15)
BUN: 8 mg/dL (ref 6–20)
CO2: 20 mmol/L — ABNORMAL LOW (ref 22–32)
Calcium: 9.5 mg/dL (ref 8.9–10.3)
Chloride: 104 mmol/L (ref 98–111)
Creatinine, Ser: 0.47 mg/dL (ref 0.44–1.00)
GFR calc Af Amer: >60 mL/min (ref >60)
GFR calc non Af Amer: >60 mL/min (ref >60)
Glucose, Bld: 96 mg/dL (ref 70–99)
Potassium: 3.8 mmol/L (ref 3.5–5.1)
Sodium: 135 mmol/L (ref 135–145)
Total Bilirubin: 0.3 mg/dL (ref 0.3–1.2)
Total Protein: 6.8 g/dL (ref 6.5–8.1)

## 2018-10-05 LAB — WET PREP, GENITAL
Sperm: NONE SEEN
Trich, Wet Prep: NONE SEEN
Yeast Wet Prep HPF POC: NONE SEEN

## 2018-10-05 MED ORDER — METOCLOPRAMIDE HCL 10 MG PO TABS: 10.0000 mg | ORAL_TABLET | Freq: Four times a day (QID) | ORAL | 0 refills | Status: DC

## 2018-10-05 MED ORDER — PROMETHAZINE HCL 25 MG PO TABS: 25.0000 mg | ORAL_TABLET | Freq: Four times a day (QID) | ORAL | 0 refills | Status: DC | PRN

## 2018-10-05 MED ORDER — ASPIRIN 81 MG PO CHEW: 81.0000 mg | CHEWABLE_TABLET | Freq: Every day | ORAL | 1 refills | Status: DC

## 2018-10-05 MED ORDER — METRONIDAZOLE 0.75 % VA GEL: 1.0000 | Freq: Two times a day (BID) | VAGINAL | 0 refills | Status: DC

## 2018-10-05 NOTE — Progress Notes (Signed)
Written and verbal d/c instructions given and understanding voiced. 

## 2018-10-05 NOTE — Discharge Instructions (Signed)
Bacterial Vaginosis  Bacterial vaginosis is a vaginal infection that occurs when the normal balance of bacteria in the vagina is disrupted. It results from an overgrowth of certain bacteria. This is the most common vaginal infection among women ages 71-44. Because bacterial vaginosis increases your risk for STIs (sexually transmitted infections), getting treated can help reduce your risk for chlamydia, gonorrhea, herpes, and HIV (human immunodeficiency virus). Treatment is also important for preventing complications in pregnant women, because this condition can cause an early (premature) delivery. What are the causes? This condition is caused by an increase in harmful bacteria that are normally present in small amounts in the vagina. However, the reason that the condition develops is not fully understood. What increases the risk? The following factors may make you more likely to develop this condition:  Having a new sexual partner or multiple sexual partners.  Having unprotected sex.  Douching.  Having an intrauterine device (IUD).  Smoking.  Drug and alcohol abuse.  Taking certain antibiotic medicines.  Being pregnant. You cannot get bacterial vaginosis from toilet seats, bedding, swimming pools, or contact with objects around you. What are the signs or symptoms? Symptoms of this condition include:  Grey or white vaginal discharge. The discharge can also be watery or foamy.  A fish-like odor with discharge, especially after sexual intercourse or during menstruation.  Itching in and around the vagina.  Burning or pain with urination. Some women with bacterial vaginosis have no signs or symptoms. How is this diagnosed? This condition is diagnosed based on:  Your medical history.  A physical exam of the vagina.  Testing a sample of vaginal fluid under a microscope to look for a large amount of bad bacteria or abnormal cells. Your health care provider may use a cotton swab or  a small wooden spatula to collect the sample. How is this treated? This condition is treated with antibiotics. These may be given as a pill, a vaginal cream, or a medicine that is put into the vagina (suppository). If the condition comes back after treatment, a second round of antibiotics may be needed. Follow these instructions at home: Medicines  Take over-the-counter and prescription medicines only as told by your health care provider.  Take or use your antibiotic as told by your health care provider. Do not stop taking or using the antibiotic even if you start to feel better. General instructions  If you have a female sexual partner, tell her that you have a vaginal infection. She should see her health care provider and be treated if she has symptoms. If you have a female sexual partner, he does not need treatment.  During treatment: ? Avoid sexual activity until you finish treatment. ? Do not douche. ? Avoid alcohol as directed by your health care provider. ? Avoid breastfeeding as directed by your health care provider.  Drink enough water and fluids to keep your urine clear or pale yellow.  Keep the area around your vagina and rectum clean. ? Wash the area daily with warm water. ? Wipe yourself from front to back after using the toilet.  Keep all follow-up visits as told by your health care provider. This is important. How is this prevented?  Do not douche.  Wash the outside of your vagina with warm water only.  Use protection when having sex. This includes latex condoms and dental dams.  Limit how many sexual partners you have. To help prevent bacterial vaginosis, it is best to have sex with just one partner (  monogamous).  Make sure you and your sexual partner are tested for STIs.  Wear cotton or cotton-lined underwear.  Avoid wearing tight pants and pantyhose, especially during summer.  Limit the amount of alcohol that you drink.  Do not use any products that contain  nicotine or tobacco, such as cigarettes and e-cigarettes. If you need help quitting, ask your health care provider.  Do not use illegal drugs. Where to find more information  Centers for Disease Control and Prevention: SolutionApps.co.zawww.cdc.gov/std  American Sexual Health Association (ASHA): www.ashastd.org  U.S. Department of Health and Health and safety inspectorHuman Services, Office on Women's Health: ConventionalMedicines.siwww.womenshealth.gov/ or http://www.anderson-williamson.info/https://www.womenshealth.gov/a-z-topics/bacterial-vaginosis Contact a health care provider if:  Your symptoms do not improve, even after treatment.  You have more discharge or pain when urinating.  You have a fever.  You have pain in your abdomen.  You have pain during sex.  You have vaginal bleeding between periods. Summary  Bacterial vaginosis is a vaginal infection that occurs when the normal balance of bacteria in the vagina is disrupted.  Because bacterial vaginosis increases your risk for STIs (sexually transmitted infections), getting treated can help reduce your risk for chlamydia, gonorrhea, herpes, and HIV (human immunodeficiency virus). Treatment is also important for preventing complications in pregnant women, because the condition can cause an early (premature) delivery.  This condition is treated with antibiotic medicines. These may be given as a pill, a vaginal cream, or a medicine that is put into the vagina (suppository). This information is not intended to replace advice given to you by your health care provider. Make sure you discuss any questions you have with your health care provider. Document Released: 06/04/2005 Document Revised: 10/08/2016 Document Reviewed: 02/18/2016 Elsevier Interactive Patient Education  2019 Elsevier Inc.  Morning Sickness  Morning sickness is when you feel sick to your stomach (nauseous) during pregnancy. You may feel sick to your stomach and throw up (vomit). You may feel sick in the morning, but you can feel this way at any time of day. Some  women feel very sick to their stomach and cannot stop throwing up (hyperemesis gravidarum). Follow these instructions at home: Medicines  Take over-the-counter and prescription medicines only as told by your doctor. Do not take any medicines until you talk with your doctor about them first.  Taking multivitamins before getting pregnant can stop or lessen the harshness of morning sickness. Eating and drinking  Eat dry toast or crackers before getting out of bed.  Eat 5 or 6 small meals a day.  Eat dry and bland foods like rice and baked potatoes.  Do not eat greasy, fatty, or spicy foods.  Have someone cook for you if the smell of food causes you to feel sick or throw up.  If you feel sick to your stomach after taking prenatal vitamins, take them at night or with a snack.  Eat protein when you need a snack. Nuts, yogurt, and cheese are good choices.  Drink fluids throughout the day.  Try ginger ale made with real ginger, ginger tea made from fresh grated ginger, or ginger candies. General instructions  Do not use any products that have nicotine or tobacco in them, such as cigarettes and e-cigarettes. If you need help quitting, ask your doctor.  Use an air purifier to keep the air in your house free of smells.  Get lots of fresh air.  Try to avoid smells that make you feel sick.  Try: ? Wearing a bracelet that is used for seasickness (acupressure wristband). ?  Going to a doctor who puts thin needles into certain body points (acupuncture) to improve how you feel. Contact a doctor if:  You need medicine to feel better.  You feel dizzy or light-headed.  You are losing weight. Get help right away if:  You feel very sick to your stomach and cannot stop throwing up.  You pass out (faint).  You have very bad pain in your belly. Summary  Morning sickness is when you feel sick to your stomach (nauseous) during pregnancy.  You may feel sick in the morning, but you can  feel this way at any time of day.  Making some changes to what you eat may help your symptoms go away. This information is not intended to replace advice given to you by your health care provider. Make sure you discuss any questions you have with your health care provider. Document Released: 07/12/2004 Document Revised: 07/05/2016 Document Reviewed: 07/05/2016 Elsevier Interactive Patient Education  2019 [employer].

## 2018-10-05 NOTE — Progress Notes (Signed)
Luna Kitchens CNM in to discuss test results and d/c plan with pt.

## 2018-10-06 LAB — GC/CHLAMYDIA PROBE AMP (~~LOC~~) NOT AT ARMC
Chlamydia: NEGATIVE
Neisseria Gonorrhea: NEGATIVE

## 2018-10-07 ENCOUNTER — Encounter (HOSPITAL_COMMUNITY): Payer: Self-pay

## 2018-10-08 ENCOUNTER — Ambulatory Visit (HOSPITAL_COMMUNITY): Payer: Medicaid Other

## 2018-10-08 ENCOUNTER — Ambulatory Visit (INDEPENDENT_AMBULATORY_CARE_PROVIDER_SITE_OTHER): Payer: Medicaid Other | Admitting: Obstetrics

## 2018-10-08 ENCOUNTER — Other Ambulatory Visit: Payer: Self-pay

## 2018-10-08 ENCOUNTER — Ambulatory Visit (HOSPITAL_COMMUNITY): Payer: Medicaid Other | Attending: Obstetrics and Gynecology

## 2018-10-08 ENCOUNTER — Encounter: Payer: Self-pay | Admitting: Obstetrics

## 2018-10-08 ENCOUNTER — Ambulatory Visit (HOSPITAL_COMMUNITY): Payer: Self-pay | Admitting: Advanced Practice Midwife

## 2018-10-08 ENCOUNTER — Ambulatory Visit (HOSPITAL_COMMUNITY): Payer: Self-pay | Admitting: Obstetrics and Gynecology

## 2018-10-08 DIAGNOSIS — O09292 Supervision of pregnancy with other poor reproductive or obstetric history, second trimester: Secondary | ICD-10-CM

## 2018-10-08 DIAGNOSIS — O0990 Supervision of high risk pregnancy, unspecified, unspecified trimester: Secondary | ICD-10-CM

## 2018-10-08 DIAGNOSIS — R898 Other abnormal findings in specimens from other organs, systems and tissues: Secondary | ICD-10-CM

## 2018-10-08 DIAGNOSIS — O289 Unspecified abnormal findings on antenatal screening of mother: Secondary | ICD-10-CM

## 2018-10-08 DIAGNOSIS — Z3A15 15 weeks gestation of pregnancy: Secondary | ICD-10-CM

## 2018-10-08 DIAGNOSIS — O09299 Supervision of pregnancy with other poor reproductive or obstetric history, unspecified trimester: Secondary | ICD-10-CM

## 2018-10-08 DIAGNOSIS — O0992 Supervision of high risk pregnancy, unspecified, second trimester: Secondary | ICD-10-CM

## 2018-10-08 NOTE — Progress Notes (Addendum)
   WEBEX VIRTUAL OBSTETRICS VISIT ENCOUNTER NOTE  I connected with Stacey [name] on 10/08/18 at 10:00 AM EDT by [facility] at home and verified that I am speaking with the correct person using two identifiers.   I discussed the limitations, risks, security and privacy concerns of performing an evaluation and management service by telephone and the availability of in person appointments. I also discussed with the patient that there may be a patient responsible charge related to this service. The patient expressed understanding and agreed to proceed.  Subjective:  Stacey [name] is a 28 y.o. [id number] at [redacted]w[redacted]d being followed for ongoing prenatal care.  She is currently monitored for the following issues for this high-risk pregnancy and has Smoker; Major depressive disorder, recurrent, severe without psychotic features (HCC); Herpes simplex vulvovaginitis; Supervision of normal first pregnancy, antepartum; and Bacterial vaginosis on their problem list.  Patient reports heartburn. Reports fetal movement. Denies any contractions, bleeding or leaking of fluid.   The following portions of the patient's history were reviewed and updated as appropriate: allergies, current medications, past family history, past medical history, past social history, past surgical history and problem list.   Objective:   General:  Alert, oriented and cooperative.   Mental Status: Normal mood and affect perceived. Normal judgment and thought content.  Rest of physical exam deferred due to type of encounter  Assessment and Plan:  Pregnancy: [id number] at [redacted]w[redacted]d 1. Supervision of high risk pregnancy, antepartum Rx: - Babyscripts Schedule Optimization - [place] MFM OB COMP + 14 WK; Future  2. History of pre-eclampsia in prior pregnancy, currently pregnant - start Baby ASA at 15-16 weeks  3. Abnormal first trimester screen - referred to MFM   Preterm labor symptoms and general obstetric precautions including but not limited to  vaginal bleeding, contractions, leaking of fluid and fetal movement were reviewed in detail with the patient.  I discussed the assessment and treatment plan with the patient. The patient was provided an opportunity to ask questions and all were answered. The patient agreed with the plan and demonstrated an understanding of the instructions. The patient was advised to call back or seek an in-person office evaluation/go to MAU at [facility] for any urgent or concerning symptoms. Please refer to After Visit Summary for other counseling recommendations.   I provided 15 minutes of non-face-to-face time during this encounter.  Return in about 4 weeks (around 11/05/2018) for Webex.  Future Appointments  Date Time Provider Department Center  10/08/2018 11:00 AM WH-MFC GENETIC COUNSELING RM WH-MFC MFC-US  11/03/2018  9:45 AM WH-MFC [place] 2 WH-MFCUS MFC-US    Coral Ceo, MD [facility] for [facility], [facility] 10-08-2018

## 2018-10-08 NOTE — Progress Notes (Signed)
Pt is on the phone preparing for webex visit. [redacted]w[redacted]d.

## 2018-10-08 NOTE — Progress Notes (Signed)
Pt in genetic counseling with Priscilia Hodges from [facility].

## 2018-10-10 LAB — AFP, SERUM, OPEN SPINA BIFIDA
AFP MoM: 0.36
AFP Value: 11.3 ng/mL
Gest. Age on Collection Date: 15.2 weeks
Maternal Age At EDD: 27.4 yr
OSBR Risk 1 IN: 10000
Test Results:: NEGATIVE
Weight: 157 [lb_av]

## 2018-10-11 ENCOUNTER — Encounter (HOSPITAL_COMMUNITY): Payer: Self-pay | Admitting: *Deleted

## 2018-10-11 ENCOUNTER — Inpatient Hospital Stay (HOSPITAL_COMMUNITY)
Admission: AD | Admit: 2018-10-11 | Discharge: 2018-10-11 | Disposition: A | Discharge status: Home / Self Care | Payer: Medicaid Other | Attending: Obstetrics and Gynecology | Admitting: Obstetrics and Gynecology

## 2018-10-11 ENCOUNTER — Inpatient Hospital Stay (HOSPITAL_COMMUNITY): Payer: Medicaid Other

## 2018-10-11 ENCOUNTER — Other Ambulatory Visit: Payer: Self-pay

## 2018-10-11 DIAGNOSIS — M545 Low back pain: Secondary | ICD-10-CM | POA: Insufficient documentation

## 2018-10-11 DIAGNOSIS — O99612 Diseases of the digestive system complicating pregnancy, second trimester: Secondary | ICD-10-CM | POA: Insufficient documentation

## 2018-10-11 DIAGNOSIS — Z792 Long term (current) use of antibiotics: Secondary | ICD-10-CM | POA: Diagnosis not present

## 2018-10-11 DIAGNOSIS — M549 Dorsalgia, unspecified: Secondary | ICD-10-CM | POA: Diagnosis not present

## 2018-10-11 DIAGNOSIS — M5431 Sciatica, right side: Secondary | ICD-10-CM | POA: Insufficient documentation

## 2018-10-11 DIAGNOSIS — Z809 Family history of malignant neoplasm, unspecified: Secondary | ICD-10-CM | POA: Insufficient documentation

## 2018-10-11 DIAGNOSIS — Z3A13 13 weeks gestation of pregnancy: Secondary | ICD-10-CM | POA: Diagnosis not present

## 2018-10-11 DIAGNOSIS — Z8261 Family history of arthritis: Secondary | ICD-10-CM | POA: Diagnosis not present

## 2018-10-11 DIAGNOSIS — K219 Gastro-esophageal reflux disease without esophagitis: Secondary | ICD-10-CM | POA: Diagnosis not present

## 2018-10-11 DIAGNOSIS — O219 Vomiting of pregnancy, unspecified: Secondary | ICD-10-CM | POA: Diagnosis not present

## 2018-10-11 DIAGNOSIS — Z3491 Encounter for supervision of normal pregnancy, unspecified, first trimester: Secondary | ICD-10-CM

## 2018-10-11 DIAGNOSIS — F17210 Nicotine dependence, cigarettes, uncomplicated: Secondary | ICD-10-CM | POA: Insufficient documentation

## 2018-10-11 DIAGNOSIS — R109 Unspecified abdominal pain: Secondary | ICD-10-CM | POA: Insufficient documentation

## 2018-10-11 DIAGNOSIS — O99332 Smoking (tobacco) complicating pregnancy, second trimester: Secondary | ICD-10-CM | POA: Insufficient documentation

## 2018-10-11 DIAGNOSIS — Z79899 Other long term (current) drug therapy: Secondary | ICD-10-CM | POA: Diagnosis not present

## 2018-10-11 DIAGNOSIS — Z3A15 15 weeks gestation of pregnancy: Secondary | ICD-10-CM | POA: Insufficient documentation

## 2018-10-11 DIAGNOSIS — R5383 Other fatigue: Secondary | ICD-10-CM | POA: Insufficient documentation

## 2018-10-11 DIAGNOSIS — O285 Abnormal chromosomal and genetic finding on antenatal screening of mother: Secondary | ICD-10-CM | POA: Diagnosis present

## 2018-10-11 DIAGNOSIS — Z825 Family history of asthma and other chronic lower respiratory diseases: Secondary | ICD-10-CM | POA: Insufficient documentation

## 2018-10-11 DIAGNOSIS — Z7982 Long term (current) use of aspirin: Secondary | ICD-10-CM | POA: Diagnosis not present

## 2018-10-11 DIAGNOSIS — O26891 Other specified pregnancy related conditions, first trimester: Secondary | ICD-10-CM

## 2018-10-11 DIAGNOSIS — O9989 Other specified diseases and conditions complicating pregnancy, childbirth and the puerperium: Secondary | ICD-10-CM | POA: Diagnosis not present

## 2018-10-11 DIAGNOSIS — M5441 Lumbago with sciatica, right side: Secondary | ICD-10-CM

## 2018-10-11 DIAGNOSIS — D563 Thalassemia minor: Secondary | ICD-10-CM | POA: Diagnosis present

## 2018-10-11 HISTORY — DX: Thalassemia minor: D56.3

## 2018-10-11 LAB — CBC WITH DIFFERENTIAL/PLATELET
Abs Immature Granulocytes: 0.05 10*3/uL (ref 0.00–0.07)
Basophils Absolute: 0 10*3/uL (ref 0.0–0.1)
Basophils Relative: 0 %
Eosinophils Absolute: 0.1 10*3/uL (ref 0.0–0.5)
Eosinophils Relative: 1 %
HCT: 33 % — ABNORMAL LOW (ref 36.0–46.0)
Hemoglobin: 11.2 g/dL — ABNORMAL LOW (ref 12.0–15.0)
Immature Granulocytes: 1 %
Lymphocytes Relative: 16 %
Lymphs Abs: 1.7 10*3/uL (ref 0.7–4.0)
MCH: 27.9 pg (ref 26.0–34.0)
MCHC: 33.9 g/dL (ref 30.0–36.0)
MCV: 82.3 fL (ref 80.0–100.0)
Monocytes Absolute: 0.6 10*3/uL (ref 0.1–1.0)
Monocytes Relative: 5 %
Neutro Abs: 8.1 10*3/uL — ABNORMAL HIGH (ref 1.7–7.7)
Neutrophils Relative %: 77 %
Platelets: 309 10*3/uL (ref 150–400)
RBC: 4.01 MIL/uL (ref 3.87–5.11)
RDW: 13.2 % (ref 11.5–15.5)
WBC: 10.5 10*3/uL (ref 4.0–10.5)
nRBC: 0 % (ref 0.0–0.2)

## 2018-10-11 LAB — URINALYSIS, ROUTINE W REFLEX MICROSCOPIC
Bilirubin Urine: NEGATIVE
Glucose, UA: NEGATIVE mg/dL
Hgb urine dipstick: NEGATIVE
Ketones, ur: NEGATIVE mg/dL
Leukocytes,Ua: NEGATIVE
Nitrite: NEGATIVE
Protein, ur: NEGATIVE mg/dL
Specific Gravity, Urine: 1.028 (ref 1.005–1.030)
pH: 6 (ref 5.0–8.0)

## 2018-10-11 MED ORDER — TRAMADOL HCL 50 MG PO TABS
100.0000 mg | ORAL_TABLET | Freq: Once | ORAL | Status: AC
  Administered 2018-10-11: 21:00:00 100 mg via ORAL
  Filled 2018-10-11: qty 2

## 2018-10-11 MED ORDER — ONDANSETRON 4 MG PO TBDP
8.0000 mg | ORAL_TABLET | Freq: Once | ORAL | Status: AC
  Administered 2018-10-11: 19:00:00 8 mg via ORAL
  Filled 2018-10-11: qty 2

## 2018-10-11 MED ORDER — PROMETHAZINE HCL 25 MG/ML IJ SOLN
25.0000 mg | Freq: Once | INTRAVENOUS | Status: AC
  Administered 2018-10-11: 19:00:00 25 mg via INTRAVENOUS
  Filled 2018-10-11: qty 1

## 2018-10-11 MED ORDER — CYCLOBENZAPRINE HCL 10 MG PO TABS: 10.0000 mg | ORAL_TABLET | Freq: Two times a day (BID) | ORAL | 0 refills | Status: DC | PRN

## 2018-10-11 NOTE — MAU Provider Note (Addendum)
Chief Complaint: Back Pain; Nausea; and Fatigue   First Provider Initiated Contact with Patient 10/11/18 1827     SUBJECTIVE HPI: Stacey [name] is a 28 y.o. [id number] at [redacted]w[redacted]d who presents to Maternity Admissions reporting: 1. Nausea, vomiting times several weeks.  Phenergan usually helps.  Also has Reglan Rx to use during the day since it is less sedating but states it does not work as well.  Unable to keep down nausea medicines or anything else today.  2. Dizziness, weakness and 6/10 dull headache in the back of her head today. 3. Right low back pain x2-3 days. 9/10 on pain scale. No improvement with Tylenol, hot or cold compresses or stretches. States it feels like sciatica that she had w/ previous pregnancy.  4. Intermittent, sharp, right groin pain. None now. C/O this pain one month ago and was seen in maternity admissions 10/04/2018 for this pain and diagnosed with BV.  Took Flagyl as instructed. Cramping resolved, but still having occasional sharp pains.  Associated signs and symptoms: Negative for fever, chills, diarrhea, constipation, urinary complaints.  Patient states she does not think she is as far along as a gestational age calculated by her last menstrual period.  States she normally has regular cycles, but that was not a normal period for her.  [facility]  No ultrasounds this pregnancy.  Past Medical History:  Diagnosis Date  . Alpha thalassemia silent carrier 10/11/2018    Genetic counseling FOB testing  . GERD (gastroesophageal reflux disease)   . 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 Kidney infection    kidney infection 2012  . Migraine    OB History  Gravida Para Term Preterm AB Living  0 3 2  SAB TAB Ectopic Multiple Live Births  0 2 0 0 2    # Outcome Date GA Lbr Len/2nd Weight Sex Delivery Anes PTL Lv  6 Current           5 Term 05/10/17 [redacted]w[redacted]d 06:06 / 00:17 3189 g M Vag-Spont EPI  LIV  4 AB 2017           3 TAB 10/30/13             Birth Comments: System Generated. Please review and update pregnancy details.  2 Term 11/02/10 [redacted]w[redacted]d  3260 g F Vag-Spont EPI  LIV     Birth Comments: post partum pre eclampsia  1 TAB              Birth Comments: System Generated. Please review and update pregnancy details.   Past Surgical History:  Procedure Laterality Date  . NO PAST SURGERIES     Social History   Socioeconomic History  . Marital status: Single    Spouse name: Not on file  . Number of children: Not on file  . Years of education: Not on file  . Highest education level: Not on file  Occupational History  . Not on file  Social Needs  . Financial resource strain: Not on file  . Food insecurity:    Worry: Not on file    Inability: Not on file  . Transportation needs:    Medical: Not on file    Non-medical: Not on file  Tobacco Use  . Smoking status: Current Some Day Smoker    Packs/day: 1.00    Types: Cigars    Last attempt to quit: 01/24/2017  Years since quitting: 1.7  . Smokeless tobacco: Never Used  . Tobacco comment: black and mild   Substance and Sexual Activity  . Alcohol use: No    Frequency: Never  . Drug use: No  . Sexual activity: Yes    Birth control/protection: None    Comment: delivvered SVD on 10 May 2017  Lifestyle  . Physical activity:    Days per week: Not on file    Minutes per session: Not on file  . Stress: Not on file  Relationships  . Social connections:    Talks on phone: Not on file    Gets together: Not on file    Attends religious service: Not on file    Active member of club or organization: Not on file    Attends meetings of clubs or organizations: Not on file    Relationship status: Not on file  . Intimate partner violence:    Fear of current or ex partner: Not on file    Emotionally abused: Not on file    Physically abused: Not on file    Forced sexual activity: Not on file  Other Topics Concern  . Not on file  Social History  Narrative  . Not on file   Family History  Problem Relation Age of Onset  . Hypertension Mother   . Asthma Brother   . Arthritis Maternal Grandmother   . Cancer Maternal Grandmother   . Arthritis Paternal Grandmother   . Anesthesia problems Neg Hx    No current facility-administered medications on file prior to encounter.    Current Outpatient Medications on File Prior to Encounter  Medication Sig Dispense Refill  . metoCLOPramide (REGLAN) 10 MG tablet Take 1 tablet (10 mg total) by mouth every 6 (six) hours. 30 tablet 0  . metroNIDAZOLE (METROGEL) 0.75 % vaginal gel Place 1 Applicatorful vaginally 2 (two) times daily. 70 g 0  . Prenatal Vit-Fe Phos-FA-Omega (VITAFOL GUMMIES) 3.33-0.333-34.8 MG CHEW Chew 3 tablets by mouth daily before breakfast. 90 tablet 11  . promethazine (PHENERGAN) 25 MG tablet Take 1 tablet (25 mg total) by mouth every 6 (six) hours as needed for nausea or vomiting. 30 tablet 0  . aspirin 81 MG chewable tablet Chew 1 tablet (81 mg total) by mouth daily. 90 tablet 1  . lidocaine (XYLOCAINE) 2 % solution Use as directed 15 mLs in the mouth or throat as needed for mouth pain. (Patient not taking: Reported on 09/03/2018) 100 mL 0   No Known Allergies  I have reviewed patient's Past Medical Hx, Surgical Hx, Family Hx, Social Hx, medications and allergies.   Review of Systems  Constitutional: Positive for fatigue. Negative for chills and fever.  Gastrointestinal: Positive for abdominal pain, nausea and vomiting. Negative for abdominal distention, constipation and diarrhea.  Genitourinary: Positive for flank pain (?). Negative for difficulty urinating, dysuria, frequency, hematuria, pelvic pain, vaginal bleeding, vaginal discharge and vaginal pain.  Musculoskeletal: Positive for back pain. Negative for myalgias, neck pain and neck stiffness.  Neurological: Positive for dizziness, weakness and headaches. Negative for syncope.    OBJECTIVE Patient Vitals for the past  24 hrs:  BP Temp Temp src Pulse Resp SpO2 Height Weight  10/11/18 2300 (!) 105/54 - - 87 16 99 % - -  10/11/18 2230 - 98.2 F (36.8 C) Axillary - - - - -  10/11/18 2134 - 98.2 F (36.8 C) Oral - - - - -  10/11/18 1910 111/62 - - (!) 106 - - - -  10/11/18 1907 107/61 - - (!) 105 - - - -  10/11/18 1906 (!) 103/55 - - 95 - - - -  10/11/18 1905 (!) 114/57 - - 94 - - - -  10/11/18 1807 (!) 107/55 - - (!) 102 - - - -  10/11/18 1751 (!) 110/56 99.2 F (37.3 C) Oral 93 16 98 % - -  10/11/18 1745 - - - - - - 5' (1.524 m) 72 kg   Constitutional: Well-developed, well-nourished female in mild distress.  Cardiovascular: normal rate/mild tachycardia Respiratory: normal rate and effort.  GI: Abd soft, mild-moderate right groin pain, gravid 2/SP.  MS: Low back no tenderness.  Full range of motion. Neurologic: Alert and oriented x 4.  GU: Neg CVAT.  SPECULUM EXAM: Declined  LAB RESULTS Results for orders placed or performed during the hospital encounter of 10/11/18 (from the past 24 hour(s))  Urinalysis, Routine w reflex microscopic     Status: Abnormal   Collection Time: 10/11/18  6:04 PM  Result Value Ref Range   Color, Urine YELLOW YELLOW   APPearance HAZY (A) CLEAR   Specific Gravity, Urine 1.028 1.005 - 1.030   pH 6.0 5.0 - 8.0   Glucose, UA NEGATIVE NEGATIVE mg/dL   Hgb urine dipstick NEGATIVE NEGATIVE   Bilirubin Urine NEGATIVE NEGATIVE   Ketones, ur NEGATIVE NEGATIVE mg/dL   Protein, ur NEGATIVE NEGATIVE mg/dL   Nitrite NEGATIVE NEGATIVE   Leukocytes,Ua NEGATIVE NEGATIVE  CBC with Differential/Platelet     Status: Abnormal   Collection Time: 10/11/18  6:31 PM  Result Value Ref Range   WBC 10.5 4.0 - 10.5 K/uL   RBC 4.01 3.87 - 5.11 MIL/uL   Hemoglobin 11.2 (L) 12.0 - 15.0 g/dL   HCT 40.9 (L) 81.1 - 91.4 %   MCV 82.3 80.0 - 100.0 fL   MCH 27.9 26.0 - 34.0 pg   MCHC 33.9 30.0 - 36.0 g/dL   RDW 78.2 95.6 - 21.3 %   Platelets 309 150 - 400 K/uL   nRBC 0.0 0.0 - 0.2 %    Neutrophils Relative % 77 %   Neutro Abs 8.1 (H) 1.7 - 7.7 K/uL   Lymphocytes Relative 16 %   Lymphs Abs 1.7 0.7 - 4.0 K/uL   Monocytes Relative 5 %   Monocytes Absolute 0.6 0.1 - 1.0 K/uL   Eosinophils Relative 1 %   Eosinophils Absolute 0.1 0.0 - 0.5 K/uL   Basophils Relative 0 %   Basophils Absolute 0.0 0.0 - 0.1 K/uL   Immature Granulocytes 1 %   Abs Immature Granulocytes 0.05 0.00 - 0.07 K/uL    IMAGING US Ob Comp Less 14 Wks  Result Date: 10/11/2018 CLINICAL DATA:  Right lower quadrant pain EXAM: OBSTETRIC <14 WK ULTRASOUND TECHNIQUE: Transabdominal ultrasound was performed for evaluation of the gestation as well as the maternal uterus and adnexal regions. COMPARISON:  None. FINDINGS: Intrauterine gestational sac: Single Yolk sac:  Not visualized Embryo:  Visualized Cardiac Activity: Visualized Heart Rate: 154 bpm MSD:    mm    w     d CRL:   69.8 mm   13 w 1 d                  [place] EDC: 04/17/2019 Subchorionic hemorrhage:  None visualized. Maternal uterus/adnexae: No adnexal mass or free fluid IMPRESSION: Thirteen week 1 day intrauterine pregnancy. Fetal heart rate 154 beats per minute. No acute maternal findings. Electronically Signed   By: Charlett Nose  M.D.   On: 10/11/2018 22:17    MAU COURSE Orders Placed This Encounter  Procedures  . US OB Comp Less 14 Wks  . Urinalysis, Routine w reflex microscopic  . CBC with Differential/Platelet  . Orthostatic vital signs  . Insert peripheral IV   Meds ordered this encounter  Medications  . promethazine (PHENERGAN) 25 mg in lactated ringers 1,000 mL infusion  . ondansetron (ZOFRAN-ODT) disintegrating tablet 8 mg  . traMADol (ULTRAM) tablet 100 mg   Nausea resolved. Awaiting Ultram. Back pain still 9/10.   Care of pt turned over to Steward DroneVeronica Franciszek Platten, [place]  [place], [place], [place] 10/11/2018  8:57 PM  Upon reassessment after US, patient reports back pain is still present and ultram did not work. Patient reports hx of back pain with  sciatica in previous pregnancies. Rx for Flexeril sent to pharmacy of choice for back pain with sciatica   Patient has phenergan and reglan at home, continue taking that as prescribed. If patient needs addition medication educated on calling the office and will prescribe her zofran for N/V, patient verbalizes understanding. PO challenge passed prior to discharge home.   Results of US discussed with patient, dating changed based on today's US. [facility] Pt stable at time of discharge home.  ASSESSMENT/PLAN:  1. Nausea and vomiting during pregnancy   2. Uncertain dates, antepartum, first trimester   3. Back pain affecting pregnancy in first trimester   4. Abdominal pain during pregnancy in first trimester   5. [redacted] weeks gestation of pregnancy   6. Acute right-sided back pain with sciatica     Discharge home Follow up as scheduled in the office  Return to MAU as needed Rx for Flexeril sent to pharmacy  Continue medication as prescribed   Follow-up Information    [facility].   Specialty:  Obstetrics and Gynecology Why:  Follow up as scheduled for prenatal appointments  Contact information: [address], [address] [place] [id number] [phone]         Allergies as of 10/11/2018   No Known Allergies     Medication List    TAKE these medications   aspirin 81 MG chewable tablet Chew 1 tablet (81 mg total) by mouth daily.   cyclobenzaprine 10 MG tablet Commonly known as:  FLEXERIL Take 1 tablet (10 mg total) by mouth 2 (two) times daily as needed for muscle spasms.   lidocaine 2 % solution Commonly known as:  XYLOCAINE Use as directed 15 mLs in the mouth or throat as needed for mouth pain.   metoCLOPramide 10 MG tablet Commonly known as:  REGLAN Take 1 tablet (10 mg total) by mouth every 6 (six) hours.   metroNIDAZOLE 0.75 % vaginal gel Commonly known as:  METROGEL Place 1 Applicatorful vaginally 2 (two)  times daily.   promethazine 25 MG tablet Commonly known as:  PHENERGAN Take 1 tablet (25 mg total) by mouth every 6 (six) hours as needed for nausea or vomiting.   Vitafol Gummies 3.33-0.333-34.8 MG Chew Chew 3 tablets by mouth daily before breakfast.      Sharyon CableVeronica C Cephas Revard, CNM 10/11/18, 11:22 PM

## 2018-10-11 NOTE — Progress Notes (Addendum)
Pt states she feels a little bit better with the Zofran but "not a whole lot."  2130: patient states her nausea is gone and headache is gone, she is still rating her back pain 9/10

## 2018-10-11 NOTE — MAU Note (Signed)
Stacey [name] is a 28 y.o. at [redacted]w[redacted]d here in MAU reporting: states she has been feeling lightheaded and weak while at work. Right lower back pain for 3 days. Has not taken any medication. Also has not been able to keep anything down today. Also has a headache.  Onset of complaint: today  Pain score: 9/10  Vitals:   10/11/18 1751  BP: (!) 110/56  Pulse: 93  Resp: 16  Temp: 99.2 F (37.3 C)  SpO2: 98%     FHT:168  Lab orders placed from triage: UA

## 2018-10-13 LAB — MATERNIT 21 PLUS CORE, BLOOD
Fetal Fraction: 9
Result (T21): NEGATIVE
Trisomy 13 (Patau syndrome): NEGATIVE
Trisomy 18 (Edwards syndrome): NEGATIVE
Trisomy 21 (Down syndrome): NEGATIVE

## 2018-10-13 NOTE — Progress Notes (Signed)
This encounter was opened in error.

## 2018-11-03 ENCOUNTER — Ambulatory Visit (HOSPITAL_COMMUNITY): Payer: Medicaid Other

## 2018-11-03 ENCOUNTER — Other Ambulatory Visit: Payer: Self-pay

## 2018-11-03 ENCOUNTER — Encounter: Payer: Medicaid Other | Admitting: Advanced Practice Midwife

## 2018-11-03 ENCOUNTER — Encounter: Payer: Self-pay | Admitting: Advanced Practice Midwife

## 2018-11-06 ENCOUNTER — Encounter: Payer: Self-pay | Admitting: Obstetrics

## 2018-11-06 ENCOUNTER — Other Ambulatory Visit (HOSPITAL_COMMUNITY)
Admission: RE | Admit: 2018-11-06 | Discharge: 2018-11-06 | Disposition: A | Discharge status: Home / Self Care | Payer: Medicaid Other | Source: Ambulatory Visit | Attending: Obstetrics | Admitting: Obstetrics

## 2018-11-06 ENCOUNTER — Other Ambulatory Visit: Payer: Self-pay

## 2018-11-06 ENCOUNTER — Ambulatory Visit (INDEPENDENT_AMBULATORY_CARE_PROVIDER_SITE_OTHER): Payer: Medicaid Other | Admitting: Obstetrics

## 2018-11-06 DIAGNOSIS — N898 Other specified noninflammatory disorders of vagina: Secondary | ICD-10-CM | POA: Insufficient documentation

## 2018-11-06 DIAGNOSIS — O26899 Other specified pregnancy related conditions, unspecified trimester: Secondary | ICD-10-CM | POA: Diagnosis not present

## 2018-11-06 DIAGNOSIS — K219 Gastro-esophageal reflux disease without esophagitis: Secondary | ICD-10-CM

## 2018-11-06 DIAGNOSIS — Z3A17 17 weeks gestation of pregnancy: Secondary | ICD-10-CM

## 2018-11-06 DIAGNOSIS — O09292 Supervision of pregnancy with other poor reproductive or obstetric history, second trimester: Secondary | ICD-10-CM

## 2018-11-06 DIAGNOSIS — O09299 Supervision of pregnancy with other poor reproductive or obstetric history, unspecified trimester: Secondary | ICD-10-CM

## 2018-11-06 DIAGNOSIS — B3731 Acute candidiasis of vulva and vagina: Secondary | ICD-10-CM

## 2018-11-06 DIAGNOSIS — O0992 Supervision of high risk pregnancy, unspecified, second trimester: Secondary | ICD-10-CM

## 2018-11-06 DIAGNOSIS — B373 Candidiasis of vulva and vagina: Secondary | ICD-10-CM

## 2018-11-06 DIAGNOSIS — O0990 Supervision of high risk pregnancy, unspecified, unspecified trimester: Secondary | ICD-10-CM

## 2018-11-06 DIAGNOSIS — O23592 Infection of other part of genital tract in pregnancy, second trimester: Secondary | ICD-10-CM

## 2018-11-06 DIAGNOSIS — M549 Dorsalgia, unspecified: Secondary | ICD-10-CM

## 2018-11-06 MED ORDER — COMFORT FIT MATERNITY SUPP SM MISC: 0 refills | Status: DC

## 2018-11-06 MED ORDER — TERCONAZOLE 0.4 % VA CREA: 1.0000 | TOPICAL_CREAM | Freq: Every day | VAGINAL | 0 refills | Status: DC

## 2018-11-06 MED ORDER — ASPIRIN 81 MG PO CHEW: 81.0000 mg | CHEWABLE_TABLET | Freq: Every day | ORAL | 2 refills | Status: DC

## 2018-11-06 MED ORDER — OMEPRAZOLE 20 MG PO CPDR: 20.0000 mg | DELAYED_RELEASE_CAPSULE | Freq: Two times a day (BID) | ORAL | 5 refills | Status: DC

## 2018-11-06 NOTE — Progress Notes (Signed)
TELEHEALTH OBSTETRICS PRENATAL VIRTUAL VIDEO VISIT ENCOUNTER NOTE  Provider location: Center for [facility] [place]   I connected with [name] on 11/06/18 at 11:15 AM EDT by WebEx OB MyChart Video Encounter at home and verified that I am speaking with the correct person using two identifiers.   I discussed the limitations, risks, security and privacy concerns of performing an evaluation and management service by telephone and the availability of in person appointments. I also discussed with the patient that there may be a patient responsible charge related to this service. The patient expressed understanding and agreed to proceed. Subjective:  Stacey [name] is a 28 y.o. [id number] at [redacted]w[redacted]d being seen today for ongoing prenatal care.  She is currently monitored for the following issues for this high-risk pregnancy and has Smoker; Major depressive disorder, recurrent, severe without psychotic features (HCC); Herpes simplex vulvovaginitis; Supervision of normal first pregnancy, antepartum; Bacterial vaginosis; Abnormal chromosomal and genetic finding on antenatal screening mother; and Alpha thalassemia silent carrier on their problem list.  Patient reports backache, heartburn and vaginal irritation.  Contractions: Not present. Vag. Bleeding: None.  Movement: Present. Denies any leaking of fluid.   The following portions of the patient's history were reviewed and updated as appropriate: allergies, current medications, past family history, past medical history, past social history, past surgical history and problem list.   Objective:  There were no vitals filed for this visit.  Fetal Status:     Movement: Present     General:  Alert, oriented and cooperative. Patient is in no acute distress.  Respiratory: Normal respiratory effort, no problems with respiration noted  Mental Status: Normal mood and affect. Normal behavior. Normal judgment and thought content.  Rest of physical exam  deferred due to type of encounter  Imaging: US Ob Comp Less 14 Wks  Result Date: 10/11/2018 CLINICAL DATA:  Right lower quadrant pain EXAM: OBSTETRIC <14 WK ULTRASOUND TECHNIQUE: Transabdominal ultrasound was performed for evaluation of the gestation as well as the maternal uterus and adnexal regions. COMPARISON:  None. FINDINGS: Intrauterine gestational sac: Single Yolk sac:  Not visualized Embryo:  Visualized Cardiac Activity: Visualized Heart Rate: 154 bpm MSD:    mm    w     d CRL:   69.8 mm   13 w 1 d                  [place] EDC: 04/17/2019 Subchorionic hemorrhage:  None visualized. Maternal uterus/adnexae: No adnexal mass or free fluid IMPRESSION: Thirteen week 1 day intrauterine pregnancy. Fetal heart rate 154 beats per minute. No acute maternal findings. Electronically Signed   By: Charlett Nose M.D.   On: 10/11/2018 22:17    Assessment and Plan:  Pregnancy: [id number] at [redacted]w[redacted]d 1. Supervision of high risk pregnancy, antepartum  2. History of pre-eclampsia in prior pregnancy, currently pregnant Rx: - aspirin 81 MG chewable tablet; Chew 1 tablet (81 mg total) by mouth daily. Stop taking at [redacted] weeks gestation.  Dispense: 90 tablet; Refill: 2  3. Backache symptom Rx: - Elastic Bandages & Supports (COMFORT FIT MATERNITY SUPP SM) MISC; WEAR AS DIRECTED.  Dispense: 1 each; Refill: 0  4. GERD without esophagitis Rx: - omeprazole (PRILOSEC) 20 MG capsule; Take 1 capsule (20 mg total) by mouth 2 (two) times daily before a meal.  Dispense: 60 capsule; Refill: 5  5. Candida vaginitis Rx: - terconazole (TERAZOL 7) 0.4 % vaginal cream; Place 1 applicator vaginally at bedtime.  Dispense: 45  g; Refill: 0  6. Vaginal discharge during pregnancy, antepartum Rx: - Cervicovaginal ancillary only   Preterm labor symptoms and general obstetric precautions including but not limited to vaginal bleeding, contractions, leaking of fluid and fetal movement were reviewed in detail with the patient. I discussed  the assessment and treatment plan with the patient. The patient was provided an opportunity to ask questions and all were answered. The patient agreed with the plan and demonstrated an understanding of the instructions. The patient was advised to call back or seek an in-person office evaluation/[facility] for any urgent or concerning symptoms. Please refer to After Visit Summary for other counseling recommendations.   I provided 15 minutes of face-to-face time during this encounter.  [facility] about 4 weeks (around 12/04/2018[facility]  Future Appointments  Date Time Provider Department Center  11/18/2018  9:30 AM [facility]-M[facility] US 5 WH-MFCUS M[facility]-US  12/04/2018 10:45 AM Sharyon Cableogers, Veronica C, CNM [facility] None    Coral Ceoharles Kemberly Taves, MD [facility], [facility] 11-06-2018

## 2018-11-06 NOTE — Progress Notes (Signed)
Webex ROB.  Patient is en route to an appointment, she will call back at her appointment time.  She has been taking hr Bp but not recording them in Babyscripts; she did not know that she was to record them.  Reports no problems today.

## 2018-11-07 LAB — CERVICOVAGINAL ANCILLARY ONLY
Bacterial vaginitis: NEGATIVE
Candida vaginitis: POSITIVE — AB
Chlamydia: NEGATIVE
Neisseria Gonorrhea: NEGATIVE
Trichomonas: POSITIVE — AB

## 2018-11-11 ENCOUNTER — Other Ambulatory Visit: Payer: Self-pay | Admitting: Obstetrics

## 2018-11-11 DIAGNOSIS — A5901 Trichomonal vulvovaginitis: Secondary | ICD-10-CM

## 2018-11-11 MED ORDER — TINIDAZOLE 500 MG PO TABS: 2.0000 g | ORAL_TABLET | Freq: Once | ORAL | 0 refills | Status: AC

## 2018-11-18 ENCOUNTER — Other Ambulatory Visit: Payer: Self-pay | Admitting: Obstetrics

## 2018-11-18 ENCOUNTER — Ambulatory Visit (HOSPITAL_COMMUNITY)
Admission: RE | Admit: 2018-11-18 | Discharge: 2018-11-18 | Disposition: A | Discharge status: Home / Self Care | Payer: Medicaid Other | Source: Ambulatory Visit | Attending: Obstetrics and Gynecology | Admitting: Obstetrics and Gynecology

## 2018-11-18 ENCOUNTER — Other Ambulatory Visit: Payer: Self-pay

## 2018-11-18 DIAGNOSIS — Z148 Genetic carrier of other disease: Secondary | ICD-10-CM

## 2018-11-18 DIAGNOSIS — Z3A18 18 weeks gestation of pregnancy: Secondary | ICD-10-CM

## 2018-11-18 DIAGNOSIS — O0990 Supervision of high risk pregnancy, unspecified, unspecified trimester: Secondary | ICD-10-CM

## 2018-11-18 DIAGNOSIS — O0992 Supervision of high risk pregnancy, unspecified, second trimester: Secondary | ICD-10-CM | POA: Diagnosis not present

## 2018-11-18 DIAGNOSIS — Z363 Encounter for antenatal screening for malformations: Secondary | ICD-10-CM | POA: Diagnosis not present

## 2018-12-04 ENCOUNTER — Encounter: Payer: Self-pay | Admitting: Certified Nurse Midwife

## 2018-12-04 ENCOUNTER — Ambulatory Visit (INDEPENDENT_AMBULATORY_CARE_PROVIDER_SITE_OTHER): Payer: Medicaid Other | Admitting: Certified Nurse Midwife

## 2018-12-04 VITALS — BP 109/66

## 2018-12-04 DIAGNOSIS — O99332 Smoking (tobacco) complicating pregnancy, second trimester: Secondary | ICD-10-CM | POA: Diagnosis not present

## 2018-12-04 DIAGNOSIS — Z3A21 21 weeks gestation of pregnancy: Secondary | ICD-10-CM

## 2018-12-04 DIAGNOSIS — O99330 Smoking (tobacco) complicating pregnancy, unspecified trimester: Secondary | ICD-10-CM

## 2018-12-04 DIAGNOSIS — G5600 Carpal tunnel syndrome, unspecified upper limb: Secondary | ICD-10-CM

## 2018-12-04 DIAGNOSIS — Z3400 Encounter for supervision of normal first pregnancy, unspecified trimester: Secondary | ICD-10-CM

## 2018-12-04 DIAGNOSIS — A6004 Herpesviral vulvovaginitis: Secondary | ICD-10-CM

## 2018-12-04 DIAGNOSIS — O26892 Other specified pregnancy related conditions, second trimester: Secondary | ICD-10-CM

## 2018-12-04 MED ORDER — NICOTINE 7 MG/24HR TD PT24: 7.0000 mg | MEDICATED_PATCH | Freq: Every day | TRANSDERMAL | 0 refills | Status: DC

## 2018-12-04 NOTE — Progress Notes (Signed)
[place] VIRTUAL VIDEO VISIT ENCOUNTER NOTE  Provider location: [facility] for [place] [facility] [place]   I connected with Stacey [name] on 12/04/18 at 10:50 AM EDT by WebEx Video Encounter at home and verified that I am speaking with the correct person using two identifiers.   I discussed the limitations, risks, security and privacy concerns of performing an evaluation and management service by telephone and the availability of in person appointments. I also discussed with the patient that there may be a patient responsible charge related to this service. The patient expressed understanding and agreed to proceed. Subjective:  Stacey [name] is a 28 y.o. [id number] at [redacted]w[redacted]d being seen today for ongoing prenatal care.  She is currently monitored for the following issues for this low-risk pregnancy and has Smoker; Major depressive disorder, recurrent, severe without psychotic features ([place]); Herpes simplex vulvovaginitis; Supervision of normal first pregnancy, antepartum; Bacterial vaginosis; Abnormal chromosomal and genetic finding on antenatal screening mother; and Alpha thalassemia silent carrier on their problem list.  Patient reports carpal tunnel symptoms and pelvic pressure.  Contractions: Not present. Vag. Bleeding: None.  Movement: Present. Denies any leaking of fluid.   The following portions of the patient's history were reviewed and updated as appropriate: allergies, current medications, past family history, past medical history, past social history, past surgical history and problem list.   Objective:   Vitals:   12/04/18 1048  BP: 109/66    Fetal Status:     Movement: Present     General:  Alert, oriented and cooperative. Patient is in no acute distress.  Respiratory: Normal respiratory effort, no problems with respiration noted  Mental Status: Normal mood and affect. Normal behavior. Normal judgment and thought content.  Rest of physical exam deferred  due to type of encounter  Imaging: [place] Mfm Ob Detail +14 Wk  Result Date: 11/18/2018 ----------------------------------------------------------------------  OBSTETRICS REPORT                       (Signed Final 11/18/2018 11:22 am) ---------------------------------------------------------------------- Patient Info  ID #:       [id number]                          D.O.B.:  [birth year] (28 yrs)  Name:       Stacey [name] [facility]                  Visit Date: 11/18/2018 09:35 am ---------------------------------------------------------------------- Performed By  Performed By:     Wilnette Kales        Ref. Address:     [place], [place]  [place], [place]                                                             [id number]  Attending:        Noralee Spaceavi Shankar MD        Location[facility]         [facility]  Referred By:      Brock BadHARLES A                    HARPER MD ---------------------------------------------------------------------- Orders   #  Description                          Code         Ordered By   1  US MFM OB DETAIL +14 WK              76811.01     Coral CeoHARLES HARPER  ----------------------------------------------------------------------   #  Order #                    Accession #                 Episode #   1  [id number]  ---------------------------------------------------------------------- Indications   Genetic carrier (alpha thalassemia silent      Z14.8   carrier)   Encounter for antenatal screening for          Z36.3   malformations   [redacted] weeks gestation of pregnancy                Z3A.18  ---------------------------------------------------------------------- Fetal Evaluation  Num Of Fetuses:         1  Fetal Heart Rate(bpm):  168  Cardiac Activity:        Observed  Presentation:           Breech  Placenta:               Anterior  P. Cord Insertion:      Visualized, central  Amniotic Fluid  AFI FV:      Within normal limits                              Largest Pocket(cm)                              4.28 ---------------------------------------------------------------------- Biometry  BPD:      42.6  mm     G. Age:  18w 6d         65  %    CI:  73   %    70 - 86                                                          FL/HC:      16.7   %    16.1 - 18.3  HC:      158.5  mm     G. Age:  18w 5d         50  %    HC/AC:      1.12        1.09 - 1.39  AC:       141   mm     G. Age:  19w 4d         76  %    FL/BPD:     62.2   %  FL:       26.5  mm     G. Age:  18w 0d         26  %    FL/AC:      18.8   %    20 - 24  CER:      19.2  mm     G. Age:  18w 4d         51  %  NFT:       5.5  mm  LV:        5.9  mm  CM:        4.1  mm  Est. FW:     258  gm      0 lb 9 oz     51  % ---------------------------------------------------------------------- OB History  Gravidity:    6         Term:   2        Prem:   2  TOP:          2        Living:  2 ---------------------------------------------------------------------- Gestational Age  LMP:           21w 1d        Date:  06/23/18                 EDD:   03/30/19  U/S Today:     18w 6d                                        EDD:   04/15/19  Best:          18w 4d     Det. By:  Early Exam  (10/11/18)   EDD:   04/17/19 ---------------------------------------------------------------------- Anatomy  Cranium:               Appears normal         LVOT:                   Appears normal  Cavum:                 Appears normal         Aortic Arch:            Appears normal  Ventricles:  Appears normal         Ductal Arch:            Appears normal  Choroid Plexus:        Appears normal         Diaphragm:              Appears normal  Cerebellum:            Appears normal         Stomach:                Appears normal, left                                                                         sided  Posterior Fossa:       Appears normal         Abdomen:                Appears normal  Nuchal Fold:           Appears normal         Abdominal Wall:         Appears nml (cord                                                                        insert, abd wall)  Face:                  Appears normal         Cord Vessels:           Appears normal (3                         (orbits and profile)                           vessel cord)  Lips:                  Appears normal         Kidneys:                Appear normal  Palate:                Not well visualized    Bladder:                Appears normal  Thoracic:              Appears normal         Spine:                  Appears normal  Heart:                 Appears normal         Upper Extremities:      Appears normal                         (  4CH, axis, and                         situs)  RVOT:                  Appears normal         Lower Extremities:      Appears normal  Other:  Fetus appears to be a female. Heels and 5th digit visualized. Nasal          bone visualized. ---------------------------------------------------------------------- Cervix Uterus Adnexa  Cervix  Length:           4.16  cm.  Normal appearance by transabdominal scan.  Uterus  No abnormality visualized.  Left Ovary  No adnexal mass visualized.  Right Ovary  No adnexal mass visualized.  Cul De Sac  No free fluid seen.  Adnexa  No abnormality visualized. ---------------------------------------------------------------------- Impression  We performed fetal anatomy scan. No makers of  aneuploidies or fetal structural defects are seen. Fetal  biometry is consistent with her previously-established dates.  Amniotic fluid is normal and good fetal activity is seen.  Patient understands the limitations of ultrasound in detecting  fetal anomalies.  On cell-free fetal DNA screening, the risks of fetal  aneuploidies are not increased.  MSAFP screening showed  low risk for open-neural tube defects.  Patient had genetic counseling on 10/06/18 (see letter  scanned into the media). ---------------------------------------------------------------------- Recommendations  Follow-up as clinically indicated. ----------------------------------------------------------------------                  Noralee Spaceavi Shankar, MD Electronically Signed Final Report   11/18/2018 11:22 am ----------------------------------------------------------------------   Assessment and Plan:  Pregnancy: [id number] at 3769w2d 1. Supervision of normal first pregnancy, antepartum - Patient doing well, reports occasional pelvic pressure and cramping  - Educated on increasing amount of water consumption to 7-8 bottles per day, if cramping and pressure worsens next week then needs to be seen in office for cervical examination- patient verbalizes understanding  - Anticipatory guidance on upcoming appointments with GTT at next visit, discussed fasting prior to appointment  - Routine prenatal care  - Patient is currently taking BP weekly but is not entering into babyscripts app, encouraged patient to use app and enter BP so that office can see and monitor for abnormal BPs   2. Herpes simplex vulvovaginitis - Suppression at 36 weeks   3. Carpal tunnel syndrome during pregnancy - Patient reports hx of carpal tunnel but worsening symptoms especially at night - Discussed use of wrist splints   4. Tobacco use during pregnancy, antepartum - Patient reports continued daily smoking of black and milds  - Currently smokes one per day but wants to quit but does not think she can stop cold [place]  - Educated and discussed use of nicotine patch during pregnancy at lowest dose to ease patient off from smoking, then can move to using patch every other day to not at all  - Patient agrees to plan of care, Rx for Nicotine patch sent to pharmacy of choice  - 5 minutes of time discussed smoking  cessation  - nicotine (NICODERM CQ - DOSED IN MG/24 HR) 7 mg/24hr patch; Place 1 patch (7 mg total) onto the skin daily.  Dispense: 28 patch; Refill: 0   Preterm labor symptoms and general obstetric precautions including but not limited to vaginal bleeding, contractions, leaking of fluid and fetal movement were reviewed in detail with the patient. I discussed the assessment  and treatment plan with the patient. The patient was provided an opportunity to ask questions and all were answered. The patient agreed with the plan and demonstrated an understanding of the instructions. The patient was advised to call back or seek an in-person office evaluation/go to MAU at [facility] for any urgent or concerning symptoms. Please refer to After Visit Summary for other counseling recommendations.   I provided 15 minutes of face-to-face time during this encounter.  Return in about 7 weeks (around 01/22/2019) for ROB/2hrGTT.  Sharyon Cable, CNM Center for [employer], [facility]

## 2018-12-04 NOTE — Progress Notes (Signed)
Pt is on the phone preparing for virtual visit with provider. [redacted]w[redacted]d. Had anatomy US on 11/18/18.

## 2018-12-20 ENCOUNTER — Inpatient Hospital Stay (HOSPITAL_COMMUNITY)
Admission: AD | Admit: 2018-12-20 | Discharge: 2018-12-21 | Disposition: A | Discharge status: Home / Self Care | Payer: Medicaid Other | Attending: Obstetrics & Gynecology | Admitting: Obstetrics & Gynecology

## 2018-12-20 ENCOUNTER — Encounter (HOSPITAL_COMMUNITY): Payer: Self-pay

## 2018-12-20 ENCOUNTER — Other Ambulatory Visit: Payer: Self-pay

## 2018-12-20 DIAGNOSIS — R509 Fever, unspecified: Secondary | ICD-10-CM | POA: Diagnosis not present

## 2018-12-20 DIAGNOSIS — M549 Dorsalgia, unspecified: Secondary | ICD-10-CM | POA: Diagnosis present

## 2018-12-20 DIAGNOSIS — O99891 Other specified diseases and conditions complicating pregnancy: Secondary | ICD-10-CM

## 2018-12-20 DIAGNOSIS — Z7982 Long term (current) use of aspirin: Secondary | ICD-10-CM | POA: Diagnosis not present

## 2018-12-20 DIAGNOSIS — Z3A23 23 weeks gestation of pregnancy: Secondary | ICD-10-CM | POA: Diagnosis not present

## 2018-12-20 DIAGNOSIS — K0889 Other specified disorders of teeth and supporting structures: Secondary | ICD-10-CM | POA: Insufficient documentation

## 2018-12-20 DIAGNOSIS — O26892 Other specified pregnancy related conditions, second trimester: Secondary | ICD-10-CM

## 2018-12-20 DIAGNOSIS — R51 Headache: Secondary | ICD-10-CM | POA: Insufficient documentation

## 2018-12-20 DIAGNOSIS — O9989 Other specified diseases and conditions complicating pregnancy, childbirth and the puerperium: Secondary | ICD-10-CM | POA: Diagnosis not present

## 2018-12-20 NOTE — MAU Note (Addendum)
Pt complaining of jaw pain since Thursday;  facial and headache pain, and stiff neck which started today. Thinks it's her wisdom teeth. Has low grade temp. Took 2 extra strength  Tylenol at 3pm, which helped a little but pain returned.   Also has lower back pain which has been going on for a while but worse today. Denies LOF, VB +FM.

## 2018-12-21 DIAGNOSIS — O9989 Other specified diseases and conditions complicating pregnancy, childbirth and the puerperium: Secondary | ICD-10-CM

## 2018-12-21 DIAGNOSIS — Z3A23 23 weeks gestation of pregnancy: Secondary | ICD-10-CM

## 2018-12-21 DIAGNOSIS — M549 Dorsalgia, unspecified: Secondary | ICD-10-CM

## 2018-12-21 DIAGNOSIS — O26892 Other specified pregnancy related conditions, second trimester: Secondary | ICD-10-CM

## 2018-12-21 DIAGNOSIS — K0889 Other specified disorders of teeth and supporting structures: Secondary | ICD-10-CM

## 2018-12-21 DIAGNOSIS — R51 Headache: Secondary | ICD-10-CM

## 2018-12-21 DIAGNOSIS — R509 Fever, unspecified: Secondary | ICD-10-CM

## 2018-12-21 LAB — URINALYSIS, ROUTINE W REFLEX MICROSCOPIC
Bilirubin Urine: NEGATIVE
Glucose, UA: NEGATIVE mg/dL
Hgb urine dipstick: NEGATIVE
Ketones, ur: 5 mg/dL — AB
Nitrite: NEGATIVE
Protein, ur: 100 mg/dL — AB
Specific Gravity, Urine: 1.028 (ref 1.005–1.030)
pH: 7 (ref 5.0–8.0)

## 2018-12-21 MED ORDER — ACETAMINOPHEN 500 MG PO TABS
1000.0000 mg | ORAL_TABLET | Freq: Once | ORAL | Status: AC
  Administered 2018-12-21: 1000 mg via ORAL
  Filled 2018-12-21: qty 2

## 2018-12-21 MED ORDER — IBUPROFEN 600 MG PO TABS
600.0000 mg | ORAL_TABLET | Freq: Once | ORAL | Status: AC
  Administered 2018-12-21: 04:00:00 600 mg via ORAL
  Filled 2018-12-21: qty 1

## 2018-12-21 MED ORDER — TRAMADOL HCL 50 MG PO TABS: 50.0000 mg | ORAL_TABLET | Freq: Four times a day (QID) | ORAL | 0 refills | Status: DC | PRN

## 2018-12-21 MED ORDER — AMOXICILLIN-POT CLAVULANATE 875-125 MG PO TABS: 1.0000 | ORAL_TABLET | Freq: Two times a day (BID) | ORAL | 0 refills | Status: DC

## 2018-12-21 NOTE — MAU Provider Note (Signed)
Chief Complaint:  Facial Pain, Headache, and Back Pain   First Provider Initiated Contact with Patient 12/21/18 0134      HPI: Stacey [name] is a 28 y.o. [id number] at [redacted]w[redacted]d by 13 week [place] who presents to maternity admissions reporting pain in her right jaw, right side of her face, and headache mostly on the right side.  This pain started today. The pain radiates down into her neck.  She believes it is related to her tooth as she never had her wisdom teeth removed. She has never had this type of pain before. The pain is associated with chills and low grade temperature at home of 99.  She denies sore throat, cough, shortness of breath, n/v, or diarrhea or constipation.  She denies sick contacts.  She does have back pain, reported at her last OB visit that is not improved by Flexeril.  She has sciatica and thinks her back pain is sciatic nerve pain. She is doing exercises for sciatica as well that are not helping.  There are no other symptoms, she has not tried any other treatments.  She is feeling normal fetal movement.   HPI  Past Medical History: Past Medical History:  Diagnosis Date  . Alpha thalassemia silent carrier 10/11/2018    Genetic counseling FOB testing  . GERD (gastroesophageal reflux disease)   . 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 Kidney infection    kidney infection 2012  . Migraine     Past obstetric history: OB History  Gravida Para Term Preterm AB Living  0 3 2  SAB TAB Ectopic Multiple Live Births  0 2 0 0 2    # Outcome Date GA Lbr Len/2nd Weight Sex Delivery Anes PTL Lv  6 Current           5 Term 05/10/17 [redacted]w[redacted]d 06:06 / 00:17 3189 g M Vag-Spont EPI  LIV  4 AB 2017          3 TAB 10/30/13             Birth Comments: System Generated. Please review and update pregnancy details.  2 Term 11/02/10 [redacted]w[redacted]d  3260 g F Vag-Spont EPI  LIV     Birth Comments: post partum pre eclampsia  1 TAB              Birth Comments: System Generated. Please review and  update pregnancy details.    Past Surgical History: Past Surgical History:  Procedure Laterality Date  . NO PAST SURGERIES      Family History: Family History  Problem Relation Age of Onset  . Hypertension Mother   . Asthma Brother   . Arthritis Maternal Grandmother   . Cancer Maternal Grandmother   . Arthritis Paternal Grandmother   . Anesthesia problems Neg Hx     Social History: Social History   Tobacco Use  . Smoking status: Current Some Day Smoker    Types: Cigars    Last attempt to quit: 01/24/2017    Years since quitting: 1.9  . Smokeless tobacco: Never Used  . Tobacco comment: black and mild-half of one every other day  Substance Use Topics  . Alcohol use: No    Frequency: Never  . Drug use: No    Allergies: No Known Allergies  Meds:  Medications Prior to Admission  Medication Sig Dispense Refill Last Dose  . aspirin 81 MG chewable tablet Chew 1 tablet (81 mg total) by  mouth daily. Stop taking at [redacted] weeks gestation. 90 tablet 2   . cyclobenzaprine (FLEXERIL) 10 MG tablet Take 1 tablet (10 mg total) by mouth 2 (two) times daily as needed for muscle spasms. 20 tablet 0   . Elastic Bandages & Supports (COMFORT FIT MATERNITY SUPP SM) MISC WEAR AS DIRECTED. 1 each 0   . lidocaine (XYLOCAINE) 2 % solution Use as directed 15 mLs in the mouth or throat as needed for mouth pain. (Patient not taking: Reported on 09/03/2018) 100 mL 0   . metoCLOPramide (REGLAN) 10 MG tablet Take 1 tablet (10 mg total) by mouth every 6 (six) hours. (Patient not taking: Reported on 11/06/2018) 30 tablet 0   . metroNIDAZOLE (METROGEL) 0.75 % vaginal gel Place 1 Applicatorful vaginally 2 (two) times daily. (Patient not taking: Reported on 11/06/2018) 70 g 0   . nicotine (NICODERM CQ - DOSED IN MG/24 HR) 7 mg/24hr patch Place 1 patch (7 mg total) onto the skin daily. 28 patch 0   . omeprazole (PRILOSEC) 20 MG capsule Take 1 capsule (20 mg total) by mouth 2 (two) times daily before a meal. 60  capsule 5   . Prenatal Vit-Fe Phos-FA-Omega (VITAFOL GUMMIES) 3.33-0.333-34.8 MG CHEW Chew 3 tablets by mouth daily before breakfast. 90 tablet 11   . promethazine (PHENERGAN) 25 MG tablet Take 1 tablet (25 mg total) by mouth every 6 (six) hours as needed for nausea or vomiting. (Patient not taking: Reported on 11/06/2018) 30 tablet 0   . terconazole (TERAZOL 7) 0.4 % vaginal cream Place 1 applicator vaginally at bedtime. 45 g 0     ROS:  Review of Systems  Constitutional: Positive for chills. Negative for fatigue and fever.  HENT: Positive for dental problem. Negative for congestion, ear pain and sore throat.   Eyes: Negative for visual disturbance.  Respiratory: Negative for cough and shortness of breath.   Cardiovascular: Negative for chest pain.  Gastrointestinal: Negative for abdominal pain, nausea and vomiting.  Genitourinary: Negative for difficulty urinating, dysuria, flank pain, pelvic pain, vaginal bleeding, vaginal discharge and vaginal pain.  Musculoskeletal: Positive for back pain and neck pain.  Neurological: Positive for headaches. Negative for dizziness and speech difficulty.  Psychiatric/Behavioral: Negative.      I have reviewed patient's Past Medical Hx, Surgical Hx, Family Hx, Social Hx, medications and allergies.   Physical Exam   Patient Vitals for the past 24 hrs:  BP Temp Temp src Pulse Resp SpO2 Height Weight  12/21/18 0238 - 99.3 F (37.4 C) Axillary - - - - -  12/21/18 0234 - - - - - 97 % - -  12/21/18 0233 (!) 92/45 - - (!) 114 - 97 % - -  12/21/18 0230 - - - - - 97 % - -  12/21/18 0225 - - - - - 96 % - -  12/21/18 0220 - - - - - 97 % - -  12/21/18 0216 (!) 88/48 - - - - - - -  12/21/18 0215 - - - (!) 116 - 98 % - -  12/21/18 0210 - - - - - 97 % - -  12/21/18 0205 - - - - - 98 % - -  12/21/18 0200 (!) 90/43 - - (!) 117 - 98 % - -  12/21/18 0155 - - - - - 98 % - -  12/21/18 0150 - - - - - 99 % - -  12/21/18 0145 (!) 97/44 - - (!) 110 - 99 % - -  12/21/18 0143 (!) 80/39 - - - - - - -  12/21/18 0140 - - - - - 98 % - -  12/21/18 0135 - - - - - 98 % - -  12/21/18 0130 - - - - - 98 % - -  12/21/18 0125 - - - - - 98 % - -  12/21/18 0120 - - - - - 99 % - -  12/21/18 0115 - - - - - 98 % - -  12/21/18 0110 - - - - - 98 % - -  12/21/18 0105 - - - - - 98 % - -  12/21/18 0100 - - - - - 99 % - -  12/21/18 0055 - - - - - 98 % - -  12/21/18 0050 - - - - - 98 % - -  12/21/18 0045 - - - - - 98 % - -  12/21/18 0040 - - - - - 100 % - -  12/21/18 0035 - 99.7 F (37.6 C) - - - 99 % - -  12/21/18 0030 - - - - - 99 % - -  12/21/18 0025 - - - - - 99 % - -  12/21/18 0020 - - - - - 98 % - -  12/21/18 0015 - - - - - 98 % - -  12/21/18 0010 - - - - - 98 % - -  12/21/18 0005 - - - - - 98 % - -  12/21/18 0000 - - - - - 99 % - -  12/20/18 2356 (!) 94/57 - - (!) 123 - 98 % - -  12/20/18 2350 - - - - - 99 % - -  12/20/18 2345 - - - - - 100 % - -  12/20/18 2340 - - - - - 100 % - -  12/20/18 2335 - - - - - 99 % - -  12/20/18 2330 - - - - - 99 % - -  12/20/18 2312 118/68 - - - - - - -  12/20/18 2308 - 99.8 F (37.7 C) Oral (!) 127 18 100 % 5' (1.524 m) 75.8 kg   Constitutional: Well-developed, well-nourished female in no acute distress.  HEART: normal rate, heart sounds, regular rhythm RESP: normal effort, lung sounds clear and equal bilaterally GI: Abd soft, non-tender, gravid appropriate for gestational age.  MS: Extremities nontender, no edema, normal ROM Neurologic: Alert and oriented x 4.  GU: Neg CVAT.      FHT:  Baseline 160 , moderate variability, accelerations present, no decelerations Contractions: None on toco or to palpation   Labs: Results for orders placed or performed during the hospital encounter of 12/20/18 (from the past 24 hour(s))  Urinalysis, Routine w reflex microscopic     Status: Abnormal   Collection Time: 12/21/18  3:00 AM  Result Value Ref Range   Color, Urine YELLOW YELLOW   APPearance HAZY (A) CLEAR    Specific Gravity, Urine 1.028 1.005 - 1.030   pH 7.0 5.0 - 8.0   Glucose, UA NEGATIVE NEGATIVE mg/dL   Hgb urine dipstick NEGATIVE NEGATIVE   Bilirubin Urine NEGATIVE NEGATIVE   Ketones, ur 5 (A) NEGATIVE mg/dL   Protein, ur 098100 (A) NEGATIVE mg/dL   Nitrite NEGATIVE NEGATIVE   Leukocytes,Ua TRACE (A) NEGATIVE   RBC / HPF 0-5 0 - 5 RBC/hpf   WBC, UA 0-5 0 - 5 WBC/hpf   Bacteria, UA RARE (A) NONE SEEN   Squamous Epithelial /  LPF 11-20 0 - 5   Mucus PRESENT    A/Positive/-- (03/18 1108)  Imaging:  No results found.  MAU Course/MDM: Orders Placed This Encounter  Procedures  . Urinalysis, Routine w reflex microscopic  . Discharge patient    Meds ordered this encounter  Medications  . acetaminophen (TYLENOL) tablet 1,000 mg  . ibuprofen (ADVIL) tablet 600 mg  . traMADol (ULTRAM) 50 MG tablet    Sig: Take 1 tablet (50 mg total) by mouth every 6 (six) hours as needed.    Dispense:  15 tablet    Refill:  0    Order Specific Question:   Supervising Provider    Answer:   Verita Schneiders A [[id number]]  . amoxicillin-clavulanate (AUGMENTIN) 875-125 MG tablet    Sig: Take 1 tablet by mouth every 12 (twelve) hours.    Dispense:  14 tablet    Refill:  0    Order Specific Question:   Supervising Provider    Answer:   Verita Schneiders A [[id number]]     NST reviewed and reactive Pt with low grade fever no higher than 99.8 at home or in MAU.  Pt pain is all in her right jaw/tooth and right side of her head c/w dental pain.  Low grade temp and dental pain make dental abscess a possible differential.  Pyelonephritis unlikely with no evidence of infection in urine and no CVA tenderness.  No respiratory symptoms or other COVID 19 criteria but offered pt the test with low grade fever and h/a.  She declined, and reports she thinks the infection is in her mouth/tooth.  Tylenol and ibuprofen given in MAU.  Rx for Tramadol and Augmentin sent to pharmacy. Pt to call [facility] and her usual dentist on Monday for  appointments.  Return to MAU if symptoms persist or worsen.  Increase PO fluids. Pt discharge with strict return/infection precautions.    Assessment: 1. Pain, dental   2. Headache in pregnancy, antepartum, second trimester   3. Low grade fever   4. Back pain affecting pregnancy in third trimester     Plan: Discharge home Labor precautions and fetal kick counts Follow-up Information    [facility] Follow up.   Why: As scheduled or call the office Monday 12/22/18 if pain persists or worsens.   Contact information: [address] [place] [address] [phone]       Dentist of your choice Follow up.   Why: Call your denist Monday 12/22/18 to follow up.         Allergies as of 12/21/2018   No Known Allergies     Medication List    STOP taking these medications   lidocaine 2 % solution Commonly known as: XYLOCAINE   metoCLOPramide 10 MG tablet Commonly known as: REGLAN   metroNIDAZOLE 0.75 % vaginal gel Commonly known as: METROGEL   terconazole 0.4 % vaginal cream Commonly known as: TERAZOL 7     TAKE these medications   amoxicillin-clavulanate 875-125 MG tablet Commonly known as: AUGMENTIN Take 1 tablet by mouth every 12 (twelve) hours.   aspirin 81 MG chewable tablet Chew 1 tablet (81 mg total) by mouth daily. Stop taking at [redacted] weeks gestation.   Comfort Fit Maternity Supp Sm Misc WEAR AS DIRECTED.   cyclobenzaprine 10 MG tablet Commonly known as: FLEXERIL Take 1 tablet (10 mg total) by mouth 2 (two) times daily as needed for muscle spasms.   nicotine 7 mg/24hr patch Commonly known as:  NICODERM CQ - dosed in mg/24 hr Place 1 patch (7 mg total) onto the skin daily.   omeprazole 20 MG capsule Commonly known as: PriLOSEC Take 1 capsule (20 mg total) by mouth 2 (two) times daily before a meal.   promethazine 25 MG tablet Commonly known as: PHENERGAN Take 1 tablet (25 mg total) by mouth every 6 (six) hours as  needed for nausea or vomiting.   traMADol 50 MG tablet Commonly known as: ULTRAM Take 1 tablet (50 mg total) by mouth every 6 (six) hours as needed.   Vitafol Gummies 3.33-0.333-34.8 MG Chew Chew 3 tablets by mouth daily before breakfast.       Sharen CounterLisa Leftwich-Kirby Certified Nurse-Midwife 12/21/2018 4:18 AM

## 2018-12-25 ENCOUNTER — Telehealth: Payer: Self-pay

## 2018-12-25 ENCOUNTER — Encounter: Payer: Medicaid Other | Admitting: Obstetrics & Gynecology

## 2018-12-25 NOTE — Telephone Encounter (Signed)
We tried calling you x2 for your OB Appt and got no answer.

## 2019-01-01 ENCOUNTER — Telehealth: Payer: Self-pay | Admitting: Obstetrics & Gynecology

## 2019-01-15 ENCOUNTER — Inpatient Hospital Stay (HOSPITAL_COMMUNITY)
Admission: AD | Admit: 2019-01-15 | Discharge: 2019-01-15 | Disposition: A | Discharge status: Home / Self Care | Payer: Medicaid Other | Attending: Obstetrics & Gynecology | Admitting: Obstetrics & Gynecology

## 2019-01-15 ENCOUNTER — Other Ambulatory Visit: Payer: Self-pay

## 2019-01-15 ENCOUNTER — Telehealth: Payer: Self-pay

## 2019-01-15 DIAGNOSIS — O09299 Supervision of pregnancy with other poor reproductive or obstetric history, unspecified trimester: Secondary | ICD-10-CM

## 2019-01-15 DIAGNOSIS — O99332 Smoking (tobacco) complicating pregnancy, second trimester: Secondary | ICD-10-CM | POA: Diagnosis not present

## 2019-01-15 DIAGNOSIS — Z3A27 27 weeks gestation of pregnancy: Secondary | ICD-10-CM | POA: Diagnosis not present

## 2019-01-15 DIAGNOSIS — O26892 Other specified pregnancy related conditions, second trimester: Secondary | ICD-10-CM

## 2019-01-15 DIAGNOSIS — O09292 Supervision of pregnancy with other poor reproductive or obstetric history, second trimester: Secondary | ICD-10-CM

## 2019-01-15 DIAGNOSIS — R102 Pelvic and perineal pain: Secondary | ICD-10-CM | POA: Insufficient documentation

## 2019-01-15 DIAGNOSIS — Z3689 Encounter for other specified antenatal screening: Secondary | ICD-10-CM

## 2019-01-15 DIAGNOSIS — F17290 Nicotine dependence, other tobacco product, uncomplicated: Secondary | ICD-10-CM | POA: Insufficient documentation

## 2019-01-15 DIAGNOSIS — R51 Headache: Secondary | ICD-10-CM | POA: Diagnosis not present

## 2019-01-15 DIAGNOSIS — G4489 Other headache syndrome: Secondary | ICD-10-CM

## 2019-01-15 LAB — URINALYSIS, ROUTINE W REFLEX MICROSCOPIC
Bilirubin Urine: NEGATIVE
Glucose, UA: NEGATIVE mg/dL
Hgb urine dipstick: NEGATIVE
Ketones, ur: NEGATIVE mg/dL
Leukocytes,Ua: NEGATIVE
Nitrite: NEGATIVE
Protein, ur: NEGATIVE mg/dL
Specific Gravity, Urine: 1.016 (ref 1.005–1.030)
pH: 7 (ref 5.0–8.0)

## 2019-01-15 LAB — COMPREHENSIVE METABOLIC PANEL
ALT: 10 U/L (ref 0–44)
AST: 13 U/L — ABNORMAL LOW (ref 15–41)
Albumin: 3.2 g/dL — ABNORMAL LOW (ref 3.5–5.0)
Alkaline Phosphatase: 69 U/L (ref 38–126)
Anion gap: 8 (ref 5–15)
BUN: <5 mg/dL — ABNORMAL LOW (ref 6–20)
CO2: 22 mmol/L (ref 22–32)
Calcium: 8.9 mg/dL (ref 8.9–10.3)
Chloride: 105 mmol/L (ref 98–111)
Creatinine, Ser: 0.44 mg/dL (ref 0.44–1.00)
GFR calc Af Amer: >60 mL/min (ref >60)
GFR calc non Af Amer: >60 mL/min (ref >60)
Glucose, Bld: 84 mg/dL (ref 70–99)
Potassium: 3.9 mmol/L (ref 3.5–5.1)
Sodium: 135 mmol/L (ref 135–145)
Total Bilirubin: 0.2 mg/dL — ABNORMAL LOW (ref 0.3–1.2)
Total Protein: 6.3 g/dL — ABNORMAL LOW (ref 6.5–8.1)

## 2019-01-15 LAB — CBC
HCT: 31.2 % — ABNORMAL LOW (ref 36.0–46.0)
Hemoglobin: 10.5 g/dL — ABNORMAL LOW (ref 12.0–15.0)
MCH: 27.8 pg (ref 26.0–34.0)
MCHC: 33.7 g/dL (ref 30.0–36.0)
MCV: 82.5 fL (ref 80.0–100.0)
Platelets: 263 10*3/uL (ref 150–400)
RBC: 3.78 MIL/uL — ABNORMAL LOW (ref 3.87–5.11)
RDW: 13 % (ref 11.5–15.5)
WBC: 14 10*3/uL — ABNORMAL HIGH (ref 4.0–10.5)
nRBC: 0 % (ref 0.0–0.2)

## 2019-01-15 LAB — PROTEIN / CREATININE RATIO, URINE
Creatinine, Urine: 130.45 mg/dL
Protein Creatinine Ratio: 0.08 mg/mg{Cre} (ref 0.00–0.15)
Total Protein, Urine: 11 mg/dL

## 2019-01-15 LAB — WET PREP, GENITAL
Clue Cells Wet Prep HPF POC: NONE SEEN
Sperm: NONE SEEN
Trich, Wet Prep: NONE SEEN
Yeast Wet Prep HPF POC: NONE SEEN

## 2019-01-15 MED ORDER — NICOTINE 7 MG/24HR TD PT24: 7.0000 mg | MEDICATED_PATCH | Freq: Every day | TRANSDERMAL | 0 refills | Status: DC

## 2019-01-15 MED ORDER — BUTALBITAL-APAP-CAFFEINE 50-325-40 MG PO TABS
2.0000 | ORAL_TABLET | Freq: Four times a day (QID) | ORAL | Status: DC | PRN
  Administered 2019-01-15: 2 via ORAL
  Filled 2019-01-15: qty 2

## 2019-01-15 MED ORDER — BUTALBITAL-APAP-CAFFEINE 50-325-40 MG PO TABS: 2.0000 | ORAL_TABLET | Freq: Four times a day (QID) | ORAL | 0 refills | Status: DC | PRN

## 2019-01-15 NOTE — Telephone Encounter (Signed)
Triage call from pt c/o constant HA's and dizziness and pelvic/vaginal pain Pt states tooth pain has resolved.  Pt has tried comfort measures of increasing water intake , tylenol, warm baths +FM, no LOF, no vaginal bleeding , no vaginal discharge.  and no swelling in extremities Pt not able to check B/P at this time because she is not home. Pt states hx of preeclampisa with same symptoms Pt advised to go to MAU no ava appt in office at this time  Pt has appt next week in office.

## 2019-01-15 NOTE — MAU Note (Addendum)
Headaches every day for past 2 wks. Tylenol usually helps but comes back at night. Called [facility] today and told to come here due to preeclampsia hx. Also having a lot of pelvic pressure esp when I move. Denies vag bleeding or LOF. Thick white d/c. Hx migraines but does not feel like migraine. More on L side, throbbing and sharp

## 2019-01-15 NOTE — MAU Provider Note (Signed)
History     CSN: [id number]  Arrival date and time: 01/15/19 [id number]   First Provider Initiated Contact with Patient 01/15/19 2131      Chief Complaint  Patient presents with  . Headache  . Pelvic Pain   Stacey [name] is a [age] y.o. [id number] at 3432w2d who receives care at [facility].  She presents today for Headache and Pelvic Pain. She states she has had a headache intermittently for the past 2 weeks.  She states she receives temporary relief with tylenol, but it comes back after the medication wears off.  She states that they headaches remain at the same intensity; throbbing on her left side. She reports a history of migraines.   She reports drinking plenty of fluids throughout the day and eating adequately. She reports thick white vaginal discharge, that is without odor, itching, or burning.  She denies vaginal bleeding or LOF.  She denies sexual activity in the last 3 days. She endorses fetal movement.      OB History    Gravida  6   Para  2   Term  2   Preterm  0   AB  3   Living  2     SAB  0   TAB  2   Ectopic  0   Multiple  0   Live Births  2           Past Medical History:  Diagnosis Date  . Alpha thalassemia silent carrier 10/11/2018   [x]  Genetic counseling [ ] FOB testing  . GERD (gastroesophageal reflux disease)   . 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 Kidney infection    kidney infection 2012  . Migraine     Past Surgical History:  Procedure Laterality Date  . NO PAST SURGERIES      Family History  Problem Relation Age of Onset  . Hypertension Mother   . Asthma Brother   . Arthritis Maternal Grandmother   . Cancer Maternal Grandmother   . Arthritis Paternal Grandmother   . Anesthesia problems Neg Hx     Social History   Tobacco Use  . Smoking status: Current Some Day Smoker    Types: Cigars    Last attempt to quit: 01/24/2017    Years since quitting: 1.9  . Smokeless tobacco: Never Used  . Tobacco comment: black and mild-half  of one every other day  Substance Use Topics  . Alcohol use: No    Frequency: Never  . Drug use: No    Allergies: No Known Allergies  Medications Prior to Admission  Medication Sig Dispense Refill Last Dose  . acetaminophen (TYLENOL) 500 MG tablet Take 1,000 mg by mouth every 6 (six) hours as needed.   1330  . aspirin 81 MG chewable tablet Chew 1 tablet (81 mg total) by mouth daily. Stop taking at [redacted] weeks gestation. 90 tablet 2 01/15/2019 at Unknown time  . omeprazole (PRILOSEC) 20 MG capsule Take 1 capsule (20 mg total) by mouth 2 (two) times daily before a meal. 60 capsule 5 01/14/2019 at Unknown time  . Prenatal Vit-Fe Phos-FA-Omega (VITAFOL GUMMIES) 3.33-0.333-34.8 MG CHEW Chew 3 tablets by mouth daily before breakfast. 90 tablet 11 01/14/2019 at Unknown time  . amoxicillin-clavulanate (AUGMENTIN) 875-125 MG tablet Take 1 tablet by mouth every 12 (twelve) hours. 14 tablet 0   . cyclobenzaprine (FLEXERIL) 10 MG tablet Take 1 tablet (10 mg total) by mouth 2 (two) times daily as needed  for muscle spasms. 20 tablet 0   . Elastic Bandages & Supports (COMFORT FIT MATERNITY SUPP SM) MISC WEAR AS DIRECTED. 1 each 0   . nicotine (NICODERM CQ - DOSED IN MG/24 HR) 7 mg/24hr patch Place 1 patch (7 mg total) onto the skin daily. 28 patch 0   . promethazine (PHENERGAN) 25 MG tablet Take 1 tablet (25 mg total) by mouth every 6 (six) hours as needed for nausea or vomiting. (Patient not taking: Reported on 11/06/2018) 30 tablet 0   . traMADol (ULTRAM) 50 MG tablet Take 1 tablet (50 mg total) by mouth every 6 (six) hours as needed. 15 tablet 0     Review of Systems  Constitutional: Negative for chills and fever.  Respiratory: Negative for cough and shortness of breath.   Gastrointestinal: Negative for abdominal pain, constipation, diarrhea, nausea and vomiting.  Genitourinary: Positive for pelvic pain and vaginal discharge. Negative for difficulty urinating, dysuria and vaginal bleeding.  Neurological:  Positive for headaches. Negative for dizziness and light-headedness.   Physical Exam   Blood pressure 112/67, pulse (!) 109, temperature 98.5 F (36.9 C), resp. rate 18, height 5' (1.524 m), weight 77 kg, last menstrual period 06/23/2018, currently breastfeeding.  Physical Exam  Constitutional: She is oriented to person, place, and time. She appears well-developed and well-nourished. No distress.  HENT:  Head: Normocephalic and atraumatic.  Eyes: Conjunctivae are normal.  Neck: Normal range of motion.  Cardiovascular: Normal rate, regular rhythm and normal heart sounds.  Respiratory: Effort normal and breath sounds normal.  GI: Soft. Bowel sounds are normal.  Genitourinary:    Vaginal discharge present.     No vaginal bleeding.  No bleeding in the vagina.    Genitourinary Comments: Speculum Exam: -Vaginal Vault: Pink Mucosa.  Small amt thin white discharge -wet prep collected -Cervix:Pink, no lesions, cysts, or polyps.  Appears closed. No active bleeding from os-Questionable CMT vs Discomfort on BME -GC/CT collected via blind swab -Bimanual Exam: Closed/Long   Musculoskeletal: Normal range of motion.  Neurological: She is alert and oriented to person, place, and time.  Skin: Skin is warm and dry.  Psychiatric: She has a normal mood and affect. Her behavior is normal.    Fetal Assessment 150 bpm, Mod Var, -Decels, +Accels Toco: None  MAU Course   Results for orders placed or performed during the hospital encounter of 01/15/19 (from the past 24 hour(s))  Urinalysis, Routine w reflex microscopic     Status: Abnormal   Collection Time: 01/15/19  8:20 PM  Result Value Ref Range   Color, Urine YELLOW YELLOW   APPearance HAZY (A) CLEAR   Specific Gravity, Urine 1.016 1.005 - 1.030   pH 7.0 5.0 - 8.0   Glucose, UA NEGATIVE NEGATIVE mg/dL   Hgb urine dipstick NEGATIVE NEGATIVE   Bilirubin Urine NEGATIVE NEGATIVE   Ketones, ur NEGATIVE NEGATIVE mg/dL   Protein, ur NEGATIVE  NEGATIVE mg/dL   Nitrite NEGATIVE NEGATIVE   Leukocytes,Ua NEGATIVE NEGATIVE  Protein / creatinine ratio, urine     Status: None   Collection Time: 01/15/19  8:20 PM  Result Value Ref Range   Creatinine, Urine 130.45 mg/dL   Total Protein, Urine 11 mg/dL   Protein Creatinine Ratio 0.08 0.00 - 0.15 mg/mg[Cre]  Wet prep, genital     Status: Abnormal   Collection Time: 01/15/19  9:45 PM   Specimen: Thin Prep Cervical/Endocervical  Result Value Ref Range   Yeast Wet Prep HPF POC NONE SEEN NONE SEEN  Trich, Wet Prep NONE SEEN NONE SEEN   Clue Cells Wet Prep HPF POC NONE SEEN NONE SEEN   WBC, Wet Prep HPF POC MODERATE (A) NONE SEEN   Sperm NONE SEEN   Comprehensive metabolic panel     Status: Abnormal   Collection Time: 01/15/19  9:57 PM  Result Value Ref Range   Sodium 135 135 - 145 mmol/L   Potassium 3.9 3.5 - 5.1 mmol/L   Chloride 105 98 - 111 mmol/L   CO2 22 22 - 32 mmol/L   Glucose, Bld 84 70 - 99 mg/dL   BUN <5 (L) 6 - 20 mg/dL   Creatinine, Ser 1.910.44 0.44 - 1.00 mg/dL   Calcium 8.9 8.9 - 47.810.3 mg/dL   Total Protein 6.3 (L) 6.5 - 8.1 g/dL   Albumin 3.2 (L) 3.5 - 5.0 g/dL   AST 13 (L) 15 - 41 U/L   ALT 10 0 - 44 U/L   Alkaline Phosphatase 69 38 - 126 U/L   Total Bilirubin 0.2 (L) 0.3 - 1.2 mg/dL   GFR calc non Af Amer >60 >60 mL/min   GFR calc Af Amer >60 >60 mL/min   Anion gap 8 5 - 15  CBC     Status: Abnormal   Collection Time: 01/15/19  9:57 PM  Result Value Ref Range   WBC 14.0 (H) 4.0 - 10.5 K/uL   RBC 3.78 (L) 3.87 - 5.11 MIL/uL   Hemoglobin 10.5 (L) 12.0 - 15.0 g/dL   HCT 29.531.2 (L) 62.136.0 - 30.846.0 %   MCV 82.5 80.0 - 100.0 fL   MCH 27.8 26.0 - 34.0 pg   MCHC 33.7 30.0 - 36.0 g/dL   RDW 65.713.0 84.611.5 - 96.215.5 %   Platelets 263 150 - 400 K/uL   nRBC 0.0 0.0 - 0.2 %   No results found.  MDM PE Labs:UA, CMP, CBC, Wet prep, GC/CT EFM Pain Medication Assessment and Plan  28 year old [id number]  SIUP at 27.2 weeks Cat I FT Headache Pelvic Pain H/O  PreEclampsia  -Exam findings discussed. -Will collect PreEclampsia labs d/t history. -Discussed how pregnancy hormones can contribute to increase in headache/migraine frequency. -Recommend neurology consult, if labs normal, which patient is agreeable to. -[facility] -Offered and accepts pain medication. Will give Fioricet. -Awaiting results  Reassessment (10:59 PM)  -Results discussed and patient informed of elevated WBCs. -Encouraged to monitor for symptoms of infection. -Wet prep returns without significant findings. Reassured that vaginal discharge can be normal and increased during pregnancy. -Encouraged usage of belly belt/band for improvement in pelvic pain.  -GC/CT pending -Patient reports  Improvement in HA with fiorcet dosing. -States that she is currently a smoker (Black and Mild Cigar daily) and would like a prescription for a nicotine patch.  -No other questions or concerns. -Rx for Nicotine patch 7mg  sent to pharmacy on file.  -Rx for Fioricet Disp 30, RF 0  -[facility] for scheduling of Neurology consult. -Encouraged to call or return to MAU if symptoms worsen or with the onset of new symptoms. -Discharged to home in improved condition.  Cherre RobinsJessica L Tamel Abel MSN, CNM 01/15/2019, 9:31 PM

## 2019-01-15 NOTE — Progress Notes (Signed)
Jessica Emly CNM in earlier to discuss test results and d/c plan. Written and verbal d/c instructions given and understanding voiced. 

## 2019-01-15 NOTE — Discharge Instructions (Signed)
Smoking During Pregnancy ° °Smoking during pregnancy is unhealthy for you and your baby. Smoke from cigarettes, pipes, and cigars contains many chemicals that can cause cancer (carcinogens). Cigarettes also contain a stimulant drug (nicotine). When you smoke, harmful substances that you breathe in enter your bloodstream and can be passed on to your baby. This can affect your baby's development. °If you are planning to become pregnant or have recently become pregnant, talk with your health care provider about quitting smoking. °How does smoking affect me? °Smoking increases your risk for many long-term (chronic) diseases. These diseases include cancer, lung diseases, and heart disease. Smoking during pregnancy increases your risk of: °· Losing the pregnancy (miscarriage or stillbirth). °· Giving birth too early (premature birth). °· Pregnancy outside of the uterus (tubal pregnancy). °· Having problems with the organ that provides the baby nourishment and oxygen (placenta), including: °? Attachment of the placenta over the opening of the uterus (placenta previa). °? Detachment of the placenta before the baby's birth (placental abruption). °· Having your water break before labor begins (premature rupture of membranes). °How does smoking affect my baby? °Before Birth °Smoking during pregnancy: °· Decreases blood flow and oxygen to your baby. °· Increases your baby’s risk of birth defects, such as heart defects. °· Increases your baby's heart rate. °· Slows your baby's growth in the uterus (intrauterine growth retardation). °After Birth °Babies born to women who smoked during pregnancy may: °· Have symptoms of nicotine withdrawal. °· Need to stay in the hospital for special care. °· May be too small at birth. °· Have a high risk of: °? Serious health problems or lifelong disabilities. °? Sudden infant death syndrome (SIDS). °? Becoming obese. °? Developing behavior or learning problems. °What can happen if changes are  not made? °When babies are born with a birth defect or illness, they often need to stay in the hospital longer before going home. Hospital stays may also be longer if you had any complications during labor or delivery. Longer hospital stays and more treatments result in higher costs for health care. °Many health issues among babies born to mothers who smoke can have a lifelong impact. This may include the long-term need for certain medicines, therapies, or other treatments. °What are the benefits of not smoking during pregnancy? °You have a much better chance of having a healthy pregnancy and a healthy baby if you do not smoke while you are pregnant. Not smoking also means that you will have a better chance of living a long and healthy life, and your baby will have a better chance of growing into a healthy child and adult. °What actions can be taken? °Quitting smoking can be difficult. Ask your health care provider for help to stop smoking. You may also consider: °· Counseling to help you quit smoking (smoking cessation counseling). °· Psychotherapy. °· Acupuncture. °· Hypnosis. °· Telephone QUIT hotlines. °If these methods do not help you, talk with your health care provider about other options. Do not take smoking cessation medicines or nicotine supplements unless your health care provider tells you to. °Where to find more information °Learn more about smoking during pregnancy and quitting smoking from: °· March of Dimes: www.marchofdimes.org/pregnancy/smoking-during-pregnancy.aspx °· U.S. Department of Health and Human Services: women.smokefree.gov °· American Cancer Society: www.cancer.org °· American Heart Association: www.heart.org °· National Cancer Institute: www.cancer.gov °For help to quit smoking: °· National smoking cessation telephone hotline: 1-800-QUIT NOW (784-8669) °Contact a health care provider if: °· You are struggling to quit smoking. °· You   are a smoker and you become pregnant or plan to become  pregnant. °· You start smoking again after giving birth. °Summary °· Tobacco smoke contains harmful substances that can affect a baby’s health and development. °· Smoking increases the risk for serious problems, such as miscarriage, birth defects, or premature birth. °· If you need help to quit smoking, ask your health care provider. °This information is not intended to replace advice given to you by your health care provider. Make sure you discuss any questions you have with your health care provider. °Document Released: 10/16/2004 Document Revised: 05/17/2017 Document Reviewed: 03/23/2016 °Elsevier Patient Education © 2020 Elsevier Inc. ° °

## 2019-01-15 NOTE — Progress Notes (Signed)
OK to d/c EFM per Jessica EMly CNM 

## 2019-01-16 ENCOUNTER — Other Ambulatory Visit: Payer: Self-pay

## 2019-01-16 DIAGNOSIS — G43911 Migraine, unspecified, intractable, with status migrainosus: Secondary | ICD-10-CM

## 2019-01-16 NOTE — Progress Notes (Signed)
Referral placed as directed per [place]

## 2019-01-17 LAB — GC/CHLAMYDIA PROBE AMP (~~LOC~~) NOT AT ARMC
Chlamydia: NEGATIVE
Neisseria Gonorrhea: NEGATIVE

## 2019-01-22 ENCOUNTER — Other Ambulatory Visit: Payer: Self-pay

## 2019-01-22 ENCOUNTER — Ambulatory Visit (INDEPENDENT_AMBULATORY_CARE_PROVIDER_SITE_OTHER): Payer: Medicaid Other | Admitting: Certified Nurse Midwife

## 2019-01-22 ENCOUNTER — Other Ambulatory Visit: Payer: Medicaid Other

## 2019-01-22 ENCOUNTER — Encounter: Payer: Self-pay | Admitting: Certified Nurse Midwife

## 2019-01-22 VITALS — BP 98/67 | HR 94 | Wt 170.2 lb

## 2019-01-22 DIAGNOSIS — Z3A28 28 weeks gestation of pregnancy: Secondary | ICD-10-CM

## 2019-01-22 DIAGNOSIS — Z8669 Personal history of other diseases of the nervous system and sense organs: Secondary | ICD-10-CM

## 2019-01-22 DIAGNOSIS — O99333 Smoking (tobacco) complicating pregnancy, third trimester: Secondary | ICD-10-CM

## 2019-01-22 DIAGNOSIS — O99019 Anemia complicating pregnancy, unspecified trimester: Secondary | ICD-10-CM

## 2019-01-22 DIAGNOSIS — A6004 Herpesviral vulvovaginitis: Secondary | ICD-10-CM

## 2019-01-22 DIAGNOSIS — D509 Iron deficiency anemia, unspecified: Secondary | ICD-10-CM

## 2019-01-22 DIAGNOSIS — O98513 Other viral diseases complicating pregnancy, third trimester: Secondary | ICD-10-CM

## 2019-01-22 DIAGNOSIS — O99330 Smoking (tobacco) complicating pregnancy, unspecified trimester: Secondary | ICD-10-CM | POA: Insufficient documentation

## 2019-01-22 DIAGNOSIS — Z3480 Encounter for supervision of other normal pregnancy, unspecified trimester: Secondary | ICD-10-CM

## 2019-01-22 DIAGNOSIS — O99013 Anemia complicating pregnancy, third trimester: Secondary | ICD-10-CM

## 2019-01-22 MED ORDER — COMFORT FIT MATERNITY SUPP LG MISC: 1.0000 [IU] | Freq: Every day | 0 refills | Status: DC

## 2019-01-22 NOTE — Patient Instructions (Signed)

## 2019-01-22 NOTE — Progress Notes (Signed)
Patient reports fetal movement with some pressure. 

## 2019-01-22 NOTE — Progress Notes (Signed)
   PRENATAL VISIT NOTE  Subjective:  Stacey [name] is a 28 y.o. G3O7564 at [redacted]w[redacted]d being seen today for ongoing prenatal care.  She is currently monitored for the following issues for this low-risk pregnancy and has Smoker; Major depressive disorder, recurrent, severe without psychotic features ([place]); Herpes simplex vulvovaginitis; Supervision of other normal pregnancy, antepartum; Bacterial vaginosis; Abnormal chromosomal and genetic finding on antenatal screening mother; Alpha thalassemia silent carrier; and Tobacco use during pregnancy on their problem list.  Patient reports headache.  Contractions: Not present. Vag. Bleeding: None.  Movement: Present. Denies leaking of fluid.   The following portions of the patient's history were reviewed and updated as appropriate: allergies, current medications, past family history, past medical history, past social history, past surgical history and problem list.   Objective:   Vitals:   01/22/19 0857  BP: 98/67  Pulse: 94  Weight: 170 lb 3.2 oz (77.2 kg)    Fetal Status: Fetal Heart Rate (bpm): 135 Fundal Height: 30 cm Movement: Present     General:  Alert, oriented and cooperative. Patient is in no acute distress.  Skin: Skin is warm and dry. No rash noted.   Cardiovascular: Normal heart rate noted  Respiratory: Normal respiratory effort, no problems with respiration noted  Abdomen: Soft, gravid, appropriate for gestational age.  Pain/Pressure: Present     Pelvic: Cervical exam deferred        Extremities: Normal range of motion.  Edema: Trace  Mental Status: Normal mood and affect. Normal behavior. Normal judgment and thought content.   Assessment and Plan:  Pregnancy: P3I9518 at [redacted]w[redacted]d 1. Supervision of other normal pregnancy, antepartum - Routine prenatal care - Anticipatory guidance on upcoming appointments - 3TL and GTT obtained today  - Glucose Tolerance, 2 Hours w/1 Hour - CBC - RPR - HIV Antibody (routine testing w rflx) -  Elastic Bandages & Supports (COMFORT FIT MATERNITY SUPP LG) MISC; 1 Units by Does not apply route daily.  Dispense: 1 each; Refill: 0  2. Herpes simplex vulvovaginitis - Suppression to be started around 34 weeks   3. Maternal tobacco use in third trimester - Patient reports continuing smoking of black and milds, recently picked up nicotine patch - educated and discussed importance of smoking cessation   4. Hx of migraines - Hx of recurrent migraines  - Was seen in MAU twice recently for HA  - Patient reports being prescribed Fioricet for HA, taking every 6 hours and it does not work  - Educated and discussed neurology appointment for HA, use of peppermint oil for HA and receiving daith piercing for migraines after pregnancy - Ambulatory referral to Neurology  Preterm labor symptoms and general obstetric precautions including but not limited to vaginal bleeding, contractions, leaking of fluid and fetal movement were reviewed in detail with the patient. Please refer to After Visit Summary for other counseling recommendations.   Return in about 4 weeks (around 02/19/2019) for ROB-mychart.  Future Appointments  Date Time Provider [place]  01/26/2019 11:00 AM Marcial Pacas, MD [facility] None  02/05/2019  9:00 AM Truett Mainland, DO [facility] None  02/19/2019 11:00 AM Lajean Manes, CNM [facility] None    Lajean Manes, CNM

## 2019-01-23 LAB — RPR: RPR Ser Ql: NONREACTIVE

## 2019-01-23 LAB — CBC
Hematocrit: 29.3 % — ABNORMAL LOW (ref 34.0–46.6)
Hemoglobin: 9.9 g/dL — ABNORMAL LOW (ref 11.1–15.9)
MCH: 27.9 pg (ref 26.6–33.0)
MCHC: 33.8 g/dL (ref 31.5–35.7)
MCV: 83 fL (ref 79–97)
Platelets: 254 10*3/uL (ref 150–450)
RBC: 3.55 x10E6/uL — ABNORMAL LOW (ref 3.77–5.28)
RDW: 13.1 % (ref 11.7–15.4)
WBC: 12 10*3/uL — ABNORMAL HIGH (ref 3.4–10.8)

## 2019-01-23 LAB — HIV ANTIBODY (ROUTINE TESTING W REFLEX): HIV Screen 4th Generation wRfx: NONREACTIVE

## 2019-01-23 LAB — GLUCOSE TOLERANCE, 2 HOURS W/ 1HR
Glucose, 1 hour: 142 mg/dL (ref 65–179)
Glucose, 2 hour: 143 mg/dL (ref 65–152)
Glucose, Fasting: 88 mg/dL (ref 65–91)

## 2019-01-26 ENCOUNTER — Ambulatory Visit: Payer: Self-pay | Admitting: Neurology

## 2019-01-26 ENCOUNTER — Encounter: Payer: Self-pay | Admitting: Neurology

## 2019-01-26 ENCOUNTER — Telehealth: Payer: Self-pay | Admitting: *Deleted

## 2019-01-26 NOTE — Telephone Encounter (Signed)
Patient called at 10:45am to cancel 11am new patient appt.  Stated something came up and she was unable to make it.

## 2019-01-27 MED ORDER — FERROUS SULFATE 325 (65 FE) MG PO TABS: 325.0000 mg | ORAL_TABLET | Freq: Two times a day (BID) | ORAL | 2 refills | Status: DC

## 2019-01-27 NOTE — Addendum Note (Signed)
Addended by: Lajean Manes on: 01/27/2019 01:08 PM   Modules accepted: Orders

## 2019-02-05 ENCOUNTER — Other Ambulatory Visit: Payer: Self-pay

## 2019-02-05 ENCOUNTER — Ambulatory Visit (INDEPENDENT_AMBULATORY_CARE_PROVIDER_SITE_OTHER): Payer: Medicaid Other | Admitting: Family Medicine

## 2019-02-05 VITALS — BP 108/64 | HR 97 | Wt 173.0 lb

## 2019-02-05 DIAGNOSIS — M9903 Segmental and somatic dysfunction of lumbar region: Secondary | ICD-10-CM

## 2019-02-05 DIAGNOSIS — M9900 Segmental and somatic dysfunction of head region: Secondary | ICD-10-CM

## 2019-02-05 DIAGNOSIS — A6004 Herpesviral vulvovaginitis: Secondary | ICD-10-CM | POA: Diagnosis not present

## 2019-02-05 DIAGNOSIS — O26893 Other specified pregnancy related conditions, third trimester: Secondary | ICD-10-CM | POA: Diagnosis not present

## 2019-02-05 DIAGNOSIS — M9908 Segmental and somatic dysfunction of rib cage: Secondary | ICD-10-CM

## 2019-02-05 DIAGNOSIS — Z3480 Encounter for supervision of other normal pregnancy, unspecified trimester: Secondary | ICD-10-CM

## 2019-02-05 DIAGNOSIS — M545 Low back pain, unspecified: Secondary | ICD-10-CM

## 2019-02-05 DIAGNOSIS — M9901 Segmental and somatic dysfunction of cervical region: Secondary | ICD-10-CM

## 2019-02-05 DIAGNOSIS — M9902 Segmental and somatic dysfunction of thoracic region: Secondary | ICD-10-CM

## 2019-02-05 DIAGNOSIS — M9904 Segmental and somatic dysfunction of sacral region: Secondary | ICD-10-CM

## 2019-02-05 DIAGNOSIS — M9905 Segmental and somatic dysfunction of pelvic region: Secondary | ICD-10-CM

## 2019-02-05 DIAGNOSIS — Z8669 Personal history of other diseases of the nervous system and sense organs: Secondary | ICD-10-CM | POA: Diagnosis not present

## 2019-02-05 DIAGNOSIS — Z3A30 30 weeks gestation of pregnancy: Secondary | ICD-10-CM

## 2019-02-05 NOTE — Progress Notes (Signed)
   PRENATAL VISIT NOTE  Subjective:  Stacey [name] is a 28 y.o. T2I7124 at [redacted]w[redacted]d being seen today for ongoing prenatal care.  She is currently monitored for the following issues for this low-risk pregnancy and has Smoker; Major depressive disorder, recurrent, severe without psychotic features ([place]); Herpes simplex vulvovaginitis; Supervision of other normal pregnancy, antepartum; Bacterial vaginosis; Abnormal chromosomal and genetic finding on antenatal screening mother; Alpha thalassemia silent carrier; and Tobacco use during pregnancy on their problem list.  Patient reports having migraines every day, fioricet not particularly helpful. Having lumbar back pain radiates down right leg some. Pain extends to middle of back. .  Contractions: Irregular. Vag. Bleeding: None.  Movement: Present. Denies leaking of fluid.   The following portions of the patient's history were reviewed and updated as appropriate: allergies, current medications, past family history, past medical history, past social history, past surgical history and problem list. Problem list updated.  Objective:   Vitals:   02/05/19 0913  BP: 108/64  Pulse: 97  Weight: 173 lb 0.6 oz (78.5 kg)    Fetal Status: Fetal Heart Rate (bpm): 156   Movement: Present     General:  Alert, oriented and cooperative. Patient is in no acute distress.  Skin: Skin is warm and dry. No rash noted.   Cardiovascular: Normal heart rate noted  Respiratory: Normal respiratory effort, no problems with respiration noted  Abdomen: Soft, gravid, appropriate for gestational age. Pain/Pressure: Present     Pelvic:  Cervical exam deferred        MSK: Restriction, tenderness, tissue texture changes, and paraspinal spasm in the lumbar, thoracic, and cervical spine  Neuro: Moves all four extremities with no focal neurological deficit  Extremities: Normal range of motion.  Edema: Trace  Mental Status: Normal mood and affect. Normal behavior. Normal judgment and  thought content.   OSE: Head OA SLRR  Cervical C3 FSRL  Thoracic T4 FSRL  Rib Rib 4 left inhaled  Lumbar L5 ESRL, L4 ESRR  Sacrum L/L  Pelvis Right ant innom    Assessment and Plan:  Pregnancy: [id number] at [redacted]w[redacted]d  1. Supervision of other normal pregnancy, antepartum FHT and FH normal  2. Herpes simplex vulvovaginitis Intrapartum PPX  3. Hx of migraines 4. Lumbar back pain 5. Somatic dysfunction of head region 6. Somatic dysfunction of spine, cervical 7. Somatic dysfunction of lumbar region 8. Somatic dysfunction of spine, thoracic 9. Somatic dysfunction of rib 10. Somatic dysfunction of pelvis region 11. Somatic dysfunction of sacral region OMT done after patient permission. HVLA technique utilized. 7 areas treated with improvement of tissue texture and joint mobility. Patient tolerated procedure well.    Preterm labor symptoms and general obstetric precautions including but not limited to vaginal bleeding, contractions, leaking of fluid and fetal movement were reviewed in detail with the patient. Please refer to After Visit Summary for other counseling recommendations.  Return in about 1 week (around 02/12/2019) for OB f/u, back pain (OMT).  Truett Mainland, DO

## 2019-02-12 ENCOUNTER — Encounter: Payer: Medicaid Other | Admitting: Family Medicine

## 2019-02-19 ENCOUNTER — Other Ambulatory Visit: Payer: Self-pay

## 2019-02-19 ENCOUNTER — Encounter: Payer: Self-pay | Admitting: Obstetrics

## 2019-02-19 ENCOUNTER — Ambulatory Visit (INDEPENDENT_AMBULATORY_CARE_PROVIDER_SITE_OTHER): Payer: Medicaid Other | Admitting: Obstetrics

## 2019-02-19 DIAGNOSIS — Z3A32 32 weeks gestation of pregnancy: Secondary | ICD-10-CM

## 2019-02-19 DIAGNOSIS — R102 Pelvic and perineal pain: Secondary | ICD-10-CM

## 2019-02-19 DIAGNOSIS — Z3483 Encounter for supervision of other normal pregnancy, third trimester: Secondary | ICD-10-CM

## 2019-02-19 DIAGNOSIS — Z3480 Encounter for supervision of other normal pregnancy, unspecified trimester: Secondary | ICD-10-CM

## 2019-02-19 NOTE — Progress Notes (Signed)
   [employer] VIRTUAL VIDEO VISIT ENCOUNTER NOTE  Provider location: [facility] [place]   I connected with Stacey [name] on 02/19/19 at 11:00 AM EDT by WebEx OB Video Encounter at home and verified that I am speaking with the correct person using two identifiers.   I discussed the limitations, risks, security and privacy concerns of performing an evaluation and management service virtually and the availability of in person appointments. I also discussed with the patient that there may be a patient responsible charge related to this service. The patient expressed understanding and agreed to proceed. Subjective:  TYLIE [name] is a 28 y.o. F7P1025 at [redacted]w[redacted]d being seen today for ongoing prenatal care.  She is currently monitored for the following issues for this high-risk pregnancy and has Smoker; Major depressive disorder, recurrent, severe without psychotic features ([place]); Herpes simplex vulvovaginitis; Supervision of other normal pregnancy, antepartum; Bacterial vaginosis; Abnormal chromosomal and genetic finding on antenatal screening mother; Alpha thalassemia silent carrier; and Tobacco use during pregnancy on their problem list.  Patient reports backache and vaginal irritation.  Contractions: Irritability. Vag. Bleeding: None.  Movement: Present. Denies any leaking of fluid.   The following portions of the patient's history were reviewed and updated as appropriate: allergies, current medications, past family history, past medical history, past social history, past surgical history and problem list.   Objective:  There were no vitals filed for this visit.  Fetal Status:     Movement: Present     General:  Alert, oriented and cooperative. Patient is in no acute distress.  Respiratory: Normal respiratory effort, no problems with respiration noted  Mental Status: Normal mood and affect. Normal behavior. Normal judgment and thought content.  Rest of  physical exam deferred due to type of encounter  Imaging: No results found.  Assessment and Plan:  Pregnancy: E5I7782 at [redacted]w[redacted]d 1. Supervision of other normal pregnancy, antepartum  2. Pelvic pain and pressure -  Maternity Belt Rx -  may need to start maternity leave from work - increase rest  Preterm labor symptoms and general obstetric precautions including but not limited to vaginal bleeding, contractions, leaking of fluid and fetal movement were reviewed in detail with the patient. I discussed the assessment and treatment plan with the patient. The patient was provided an opportunity to ask questions and all were answered. The patient agreed with the plan and demonstrated an understanding of the instructions. The patient was advised to call back or seek an in-person office evaluation/go to [facility]U at [facility] for any urgent or concerning symptoms. Please refer to After Visit Summary for other counseling recommendations.   I provided 10 minutes of face-to-face time during this encounter.  Return in about 2 weeks (around 03/05/2019) for WebEx.  Future Appointments  Date Time Provider [place]  02/19/2019 11:00 AM Shelly Bombard, MD [place] None  03/02/2019  3:00 PM Marcial Pacas, MD [facility] None    Baltazar Najjar, [place] [facility] 02/19/2019

## 2019-02-19 NOTE — Progress Notes (Signed)
I connected with  Stacey [name] on 02/19/19 by a video enabled telemedicine application and verified that I am speaking with the correct person using two identifiers.  WebEX ROB, c/o vaginal itching, Creamy discharge.  Denies odor, fever, chills, lower abdominal pain, NV x2 days.  Does not take PNV because they make her sick. She does not have BP Cuff with her, she is not at home.

## 2019-02-26 ENCOUNTER — Other Ambulatory Visit: Payer: Self-pay

## 2019-02-26 ENCOUNTER — Encounter (HOSPITAL_COMMUNITY): Payer: Self-pay | Admitting: *Deleted

## 2019-02-26 ENCOUNTER — Inpatient Hospital Stay (HOSPITAL_COMMUNITY)
Admission: AD | Admit: 2019-02-26 | Discharge: 2019-02-27 | Disposition: A | Discharge status: Home / Self Care | Payer: Medicaid Other | Attending: Obstetrics and Gynecology | Admitting: Obstetrics and Gynecology

## 2019-02-26 DIAGNOSIS — O26893 Other specified pregnancy related conditions, third trimester: Secondary | ICD-10-CM | POA: Diagnosis not present

## 2019-02-26 DIAGNOSIS — Z7982 Long term (current) use of aspirin: Secondary | ICD-10-CM | POA: Insufficient documentation

## 2019-02-26 DIAGNOSIS — F17290 Nicotine dependence, other tobacco product, uncomplicated: Secondary | ICD-10-CM | POA: Insufficient documentation

## 2019-02-26 DIAGNOSIS — O26899 Other specified pregnancy related conditions, unspecified trimester: Secondary | ICD-10-CM

## 2019-02-26 DIAGNOSIS — R102 Pelvic and perineal pain: Secondary | ICD-10-CM | POA: Diagnosis not present

## 2019-02-26 DIAGNOSIS — Z3A33 33 weeks gestation of pregnancy: Secondary | ICD-10-CM | POA: Insufficient documentation

## 2019-02-26 DIAGNOSIS — R42 Dizziness and giddiness: Secondary | ICD-10-CM

## 2019-02-26 DIAGNOSIS — Z3A30 30 weeks gestation of pregnancy: Secondary | ICD-10-CM | POA: Diagnosis not present

## 2019-02-26 DIAGNOSIS — O99333 Smoking (tobacco) complicating pregnancy, third trimester: Secondary | ICD-10-CM | POA: Insufficient documentation

## 2019-02-26 LAB — URINALYSIS, ROUTINE W REFLEX MICROSCOPIC
Bilirubin Urine: NEGATIVE
Glucose, UA: NEGATIVE mg/dL
Hgb urine dipstick: NEGATIVE
Ketones, ur: NEGATIVE mg/dL
Nitrite: NEGATIVE
Protein, ur: NEGATIVE mg/dL
Specific Gravity, Urine: 1.011 (ref 1.005–1.030)
pH: 6 (ref 5.0–8.0)

## 2019-02-26 NOTE — MAU Provider Note (Signed)
Chief Complaint:  Pelvic Pain and Dizziness   First Provider Initiated Contact with Patient 02/26/19 2311      HPI: Stacey [name] is a 28 y.o. U2P5361 at 86w2dwho presents to maternity admissions reporting dizziness while at work today.  Also has been feeling pelvic and vaginal pressure this week, seemed worse today. She reports good fetal movement, denies LOF, vaginal bleeding, vaginal itching/burning, urinary symptoms, h/a, n/v, diarrhea, constipation or fever/chills.  She denies headache, visual changes or RUQ abdominal pain. Has appt with neurologist Monday for migraine evaluation  RN Note: Pain and pressure in vaginal area. Was sitting at work and got dizzy 2-3 times today. Denies LOF or bleeding. Thick, white vaginal d/c. Slight vag itching  Past Medical History: Past Medical History:  Diagnosis Date  . Alpha thalassemia silent carrier 10/11/2018   [x]  Genetic counseling [ ] FOB testing  . GERD (gastroesophageal reflux disease)   . 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 Kidney infection    kidney infection 2012  . Migraine     Past obstetric history: OB History  Gravida Para Term Preterm AB Living  6 2 2  0 3 2  SAB TAB Ectopic Multiple Live Births  0 2 0 0 2    # Outcome Date GA Lbr Len/2nd Weight Sex Delivery Anes PTL Lv  6 Current           5 Term 05/10/17 [redacted]w[redacted]d 06:06 / 00:17 3189 g M Vag-Spont EPI  LIV  4 AB 2017          3 TAB 10/30/13             Birth Comments: System Generated. Please review and update pregnancy details.  2 Term 11/02/10 [redacted]w[redacted]d  3260 g F Vag-Spont EPI  LIV     Birth Comments: post partum pre eclampsia  1 TAB              Birth Comments: System Generated. Please review and update pregnancy details.    Past Surgical History: Past Surgical History:  Procedure Laterality Date  . NO PAST SURGERIES      Family History: Family History  Problem Relation Age of Onset  . Hypertension Mother   . Asthma Brother   . Arthritis Maternal Grandmother    . Cancer Maternal Grandmother   . Arthritis Paternal Grandmother   . Anesthesia problems Neg Hx     Social History: Social History   Tobacco Use  . Smoking status: Current Some Day Smoker    Types: Cigars    Last attempt to quit: 01/24/2017    Years since quitting: 2.0  . Smokeless tobacco: Never Used  . Tobacco comment: black and mild-half of one every other day  Substance Use Topics  . Alcohol use: No    Frequency: Never  . Drug use: No    Allergies: No Known Allergies  Meds:  Medications Prior to Admission  Medication Sig Dispense Refill Last Dose  . aspirin 81 MG chewable tablet Chew 1 tablet (81 mg total) by mouth daily. Stop taking at [redacted] weeks gestation. 90 tablet 2 Past Week at Unknown time  . butalbital-acetaminophen-caffeine (FIORICET) 50-325-40 MG tablet Take 2 tablets by mouth every 6 (six) hours as needed for headache. 14 tablet 0 Past Week at Unknown time  . omeprazole (PRILOSEC) 20 MG capsule Take 1 capsule (20 mg total) by mouth 2 (two) times daily before a meal. 60 capsule 5 02/25/2019 at Unknown time  .  Prenatal Vit-Fe Phos-FA-Omega (VITAFOL GUMMIES) 3.33-0.333-34.8 MG CHEW Chew 3 tablets by mouth daily before breakfast. 90 tablet 11 02/25/2019 at Unknown time  . Elastic Bandages & Supports (COMFORT FIT MATERNITY SUPP LG) MISC 1 Units by Does not apply route daily. 1 each 0   . Elastic Bandages & Supports (COMFORT FIT MATERNITY SUPP SM) MISC WEAR AS DIRECTED. (Patient not taking: Reported on 02/05/2019) 1 each 0   . ferrous sulfate (FERROUSUL) 325 (65 FE) MG tablet Take 1 tablet (325 mg total) by mouth 2 (two) times daily. (Patient not taking: Reported on 02/05/2019) 60 tablet 2   . nicotine (NICODERM CQ - DOSED IN MG/24 HR) 7 mg/24hr patch Place 1 patch (7 mg total) onto the skin daily. (Patient not taking: Reported on 01/22/2019) 28 patch 0     I have reviewed patient's Past Medical Hx, Surgical Hx, Family Hx, Social Hx, medications and allergies.   ROS:  Review of  Systems  Constitutional: Negative for chills and fever.  Eyes: Negative for visual disturbance.  Respiratory: Negative for shortness of breath.   Gastrointestinal: Negative for constipation, diarrhea and nausea.  Genitourinary: Positive for pelvic pain (pressure). Negative for vaginal bleeding.  Neurological: Positive for light-headedness. Negative for dizziness (no vertigo, more like light-headed).   Other systems negative  Physical Exam   Patient Vitals for the past 24 hrs:  BP Temp Pulse Resp Height Weight  02/26/19 2256 108/61 - (!) 115 - - -  02/26/19 2254 - 98.4 F (36.9 C) - 18 5' (1.524 m) 79.4 kg   Orthostatic VS: Lying 112/60    116        Sitting 101/57    108  Standing  107/56   128  Constitutional: Well-developed, well-nourished female in no acute distress.  Cardiovascular: normal rate and rhythm Respiratory: normal effort, clear to auscultation bilaterally GI: Abd soft, non-tender, gravid appropriate for gestational age.   No rebound or guarding. MS: Extremities nontender, no edema, normal ROM Neurologic: Alert and oriented x 4. No obvious neurological abnormalities GU: Neg CVAT.  PELVIC EXAM:   Cervix long and closed  FHT:  Baseline 140 , moderate variability, accelerations present, no decelerations Contractions:  Rare   Labs: Results for orders placed or performed during the hospital encounter of 02/26/19 (from the past 24 hour(s))  Urinalysis, Routine w reflex microscopic     Status: Abnormal   Collection Time: 02/26/19 11:00 PM  Result Value Ref Range   Color, Urine YELLOW YELLOW   APPearance HAZY (A) CLEAR   Specific Gravity, Urine 1.011 1.005 - 1.030   pH 6.0 5.0 - 8.0   Glucose, UA NEGATIVE NEGATIVE mg/dL   Hgb urine dipstick NEGATIVE NEGATIVE   Bilirubin Urine NEGATIVE NEGATIVE   Ketones, ur NEGATIVE NEGATIVE mg/dL   Protein, ur NEGATIVE NEGATIVE mg/dL   Nitrite NEGATIVE NEGATIVE   Leukocytes,Ua TRACE (A) NEGATIVE   RBC / HPF 0-5 0 - 5  RBC/hpf   WBC, UA 6-10 0 - 5 WBC/hpf   Bacteria, UA RARE (A) NONE SEEN   Squamous Epithelial / LPF 11-20 0 - 5   Mucus PRESENT     A/Positive/-- (03/18 1108)  Imaging:  No results found.  MAU Course/MDM: I have ordered labs and reviewed results. Urine shows she is well hydrated.   NST reviewed, reactive.  There is no evidence for preterm labor Discussed lightheadedness may be related to migraine aura or possible mild dehydration Advised to mention it to neurologist on Monday at her  appointment Pelvic pressure is likely due to vertex presentation and descent in pelvis.  Advised pregnancy support belt  Assessment: Pregnancy at 4816w3d Dizziness - Plan: Discharge patient  Pelvic pressure in pregnancy - Plan: Discharge patient    Plan: Discharge home Preterm Labor precautions and fetal kick counts Followup with neurology re: migraines and lightheadedness Follow up in Office for prenatal visits and recheck  Encouraged to return here or to other Urgent Care/ED if she develops worsening of symptoms, increase in pain, fever, or other concerning symptoms.   Pt stable at time of discharge.  Wynelle BourgeoisMarie Williams CNM, MSN Certified Nurse-Midwife 02/26/2019 11:27 PM

## 2019-02-26 NOTE — MAU Note (Signed)
Pain and pressure in vaginal area. Was sitting at work and got dizzy 2-3 times today. Denies LOF or bleeding. Thick, white vaginal d/c. Slight vag itching

## 2019-02-27 DIAGNOSIS — Z3A33 33 weeks gestation of pregnancy: Secondary | ICD-10-CM | POA: Diagnosis not present

## 2019-02-27 DIAGNOSIS — R102 Pelvic and perineal pain: Secondary | ICD-10-CM | POA: Diagnosis not present

## 2019-02-27 DIAGNOSIS — O26893 Other specified pregnancy related conditions, third trimester: Secondary | ICD-10-CM | POA: Diagnosis not present

## 2019-02-27 DIAGNOSIS — O99333 Smoking (tobacco) complicating pregnancy, third trimester: Secondary | ICD-10-CM | POA: Diagnosis not present

## 2019-02-27 DIAGNOSIS — Z7982 Long term (current) use of aspirin: Secondary | ICD-10-CM | POA: Diagnosis not present

## 2019-02-27 DIAGNOSIS — Z3A30 30 weeks gestation of pregnancy: Secondary | ICD-10-CM | POA: Diagnosis not present

## 2019-02-27 DIAGNOSIS — F17290 Nicotine dependence, other tobacco product, uncomplicated: Secondary | ICD-10-CM | POA: Diagnosis not present

## 2019-02-27 DIAGNOSIS — R42 Dizziness and giddiness: Secondary | ICD-10-CM | POA: Diagnosis not present

## 2019-02-27 NOTE — Discharge Instructions (Signed)
Dizziness °Dizziness is a common problem. It is a feeling of unsteadiness or light-headedness. You may feel like you are about to faint. Dizziness can lead to injury if you stumble or fall. Anyone can become dizzy, but dizziness is more common in older adults. This condition can be caused by a number of things, including medicines, dehydration, or illness. °Follow these instructions at home: °Eating and drinking °· Drink enough fluid to keep your urine clear or pale yellow. This helps to keep you from becoming dehydrated. Try to drink more clear fluids, such as water. °· Do not drink alcohol. °· Limit your caffeine intake if told to do so by your health care provider. Check ingredients and nutrition facts to see if a food or beverage contains caffeine. °· Limit your salt (sodium) intake if told to do so by your health care provider. Check ingredients and nutrition facts to see if a food or beverage contains sodium. °Activity °· Avoid making quick movements. °? Rise slowly from chairs and steady yourself until you feel okay. °? In the morning, first sit up on the side of the bed. When you feel okay, stand slowly while you hold onto something until you know that your balance is fine. °· If you need to stand in one place for a long time, move your legs often. Tighten and relax the muscles in your legs while you are standing. °· Do not drive or use heavy machinery if you feel dizzy. °· Avoid bending down if you feel dizzy. Place items in your home so that they are easy for you to reach without leaning over. °Lifestyle °· Do not use any products that contain nicotine or tobacco, such as cigarettes and e-cigarettes. If you need help quitting, ask your health care provider. °· Try to reduce your stress level by using methods such as yoga or meditation. Talk with your health care provider if you need help to manage your stress. °General instructions °· Watch your dizziness for any changes. °· Take over-the-counter and  prescription medicines only as told by your health care provider. Talk with your health care provider if you think that your dizziness is caused by a medicine that you are taking. °· Tell a friend or a family member that you are feeling dizzy. If he or she notices any changes in your behavior, have this person call your health care provider. °· Keep all follow-up visits as told by your health care provider. This is important. °Contact a health care provider if: °· Your dizziness does not go away. °· Your dizziness or light-headedness gets worse. °· You feel nauseous. °· You have reduced hearing. °· You have new symptoms. °· You are unsteady on your feet or you feel like the room is spinning. °Get help right away if: °· You vomit or have diarrhea and are unable to eat or drink anything. °· You have problems talking, walking, swallowing, or using your arms, hands, or legs. °· You feel generally weak. °· You are not thinking clearly or you have trouble forming sentences. It may take a friend or family member to notice this. °· You have chest pain, abdominal pain, shortness of breath, or sweating. °· Your vision changes. °· You have any bleeding. °· You have a severe headache. °· You have neck pain or a stiff neck. °· You have a fever. °These symptoms may represent a serious problem that is an emergency. Do not wait to see if the symptoms will go away. Get medical help   right away. Call your local emergency services (911 in the [place] Do not drive yourself to the hospital. Summary  Dizziness is a feeling of unsteadiness or light-headedness. This condition can be caused by a number of things, including medicines, dehydration, or illness.  Anyone can become dizzy, but dizziness is more common in older adults.  Drink enough fluid to keep your urine clear or pale yellow. Do not drink alcohol.  Avoid making quick movements if you feel dizzy. Monitor your dizziness for any changes. This information is not intended to  replace advice given to you by your health care provider. Make sure you discuss any questions you have with your health care provider. Document Released: 11/28/2000 Document Revised: 06/07/2017 Document Reviewed: 07/07/2016 Elsevier Patient Education  2020 [employer]  Third Trimester of Pregnancy The third trimester is from week 28 through week 40 (months 7 through 9). The third trimester is a time when the unborn baby (fetus) is growing rapidly. At the end of the ninth month, the fetus is about 20 inches in length and weighs 6-10 pounds. Body changes during your third trimester Your body will continue to go through many changes during pregnancy. The changes vary from woman to woman. During the third trimester:  Your weight will continue to increase. You can expect to gain 25-35 pounds (11-16 kg) by the end of the pregnancy.  You may begin to get stretch marks on your hips, abdomen, and breasts.  You may urinate more often because the fetus is moving lower into your pelvis and pressing on your bladder.  You may develop or continue to have heartburn. This is caused by increased hormones that slow down muscles in the digestive tract.  You may develop or continue to have constipation because increased hormones slow digestion and cause the muscles that push waste through your intestines to relax.  You may develop hemorrhoids. These are swollen veins (varicose veins) in the rectum that can itch or be painful.  You may develop swollen, bulging veins (varicose veins) in your legs.  You may have increased body aches in the pelvis, back, or thighs. This is due to weight gain and increased hormones that are relaxing your joints.  You may have changes in your hair. These can include thickening of your hair, rapid growth, and changes in texture. Some women also have hair loss during or after pregnancy, or hair that feels dry or thin. Your hair will most likely return to normal after your baby is  born.  Your breasts will continue to grow and they will continue to become tender. A yellow fluid (colostrum) may leak from your breasts. This is the first milk you are producing for your baby.  Your belly button may stick out.  You may notice more swelling in your hands, face, or ankles.  You may have increased tingling or numbness in your hands, arms, and legs. The skin on your belly may also feel numb.  You may feel short of breath because of your expanding uterus.  You may have more problems sleeping. This can be caused by the size of your belly, increased need to urinate, and an increase in your body's metabolism.  You may notice the fetus "dropping," or moving lower in your abdomen (lightening).  You may have increased vaginal discharge.  You may notice your joints feel loose and you may have pain around your pelvic bone. What to expect at prenatal visits You will have prenatal exams every 2 weeks until week 36.  Then you will have weekly prenatal exams. During a routine prenatal visit:  You will be weighed to make sure you and the baby are growing normally.  Your blood pressure will be taken.  Your abdomen will be measured to track your baby's growth.  The fetal heartbeat will be listened to.  Any test results from the previous visit will be discussed.  You may have a cervical check near your due date to see if your cervix has softened or thinned (effaced).  You will be tested for Group B streptococcus. This happens between 35 and 37 weeks. Your health care provider may ask you:  What your birth plan is.  How you are feeling.  If you are feeling the baby move.  If you have had any abnormal symptoms, such as leaking fluid, bleeding, severe headaches, or abdominal cramping.  If you are using any tobacco products, including cigarettes, chewing tobacco, and electronic cigarettes.  If you have any questions. Other tests or screenings that may be performed during your  third trimester include:  Blood tests that check for low iron levels (anemia).  Fetal testing to check the health, activity level, and growth of the fetus. Testing is done if you have certain medical conditions or if there are problems during the pregnancy.  Nonstress test (NST). This test checks the health of your baby to make sure there are no signs of problems, such as the baby not getting enough oxygen. During this test, a belt is placed around your belly. The baby is made to move, and its heart rate is monitored during movement. What is false labor? False labor is a condition in which you feel small, irregular tightenings of the muscles in the womb (contractions) that usually go away with rest, changing position, or drinking water. These are called Braxton Hicks contractions. Contractions may last for hours, days, or even weeks before true labor sets in. If contractions come at regular intervals, become more frequent, increase in intensity, or become painful, you should see your health care provider. What are the signs of labor?  Abdominal cramps.  Regular contractions that start at 10 minutes apart and become stronger and more frequent with time.  Contractions that start on the top of the uterus and spread down to the lower abdomen and back.  Increased pelvic pressure and dull back pain.  A watery or bloody mucus discharge that comes from the vagina.  Leaking of amniotic fluid. This is also known as your "water breaking." It could be a slow trickle or a gush. Let your health care provider know if it has a color or strange odor. If you have any of these signs, call your health care provider right away, even if it is before your due date. Follow these instructions at home: Medicines  Follow your health care provider's instructions regarding medicine use. Specific medicines may be either safe or unsafe to take during pregnancy.  Take a prenatal vitamin that contains at least 600  micrograms (mcg) of folic acid.  If you develop constipation, try taking a stool softener if your health care provider approves. Eating and drinking   Eat a balanced diet that includes fresh fruits and vegetables, whole grains, good sources of protein such as meat, eggs, or tofu, and low-fat dairy. Your health care provider will help you determine the amount of weight gain that is right for you.  Avoid raw meat and uncooked cheese. These carry germs that can cause birth defects in the baby.  If you have low calcium intake from food, talk to your health care provider about whether you should take a daily calcium supplement.  Eat four or five small meals rather than three large meals a day.  Limit foods that are high in fat and processed sugars, such as fried and sweet foods.  To prevent constipation: ? Drink enough fluid to keep your urine clear or pale yellow. ? Eat foods that are high in fiber, such as fresh fruits and vegetables, whole grains, and beans. Activity  Exercise only as directed by your health care provider. Most women can continue their usual exercise routine during pregnancy. Try to exercise for 30 minutes at least 5 days a week. Stop exercising if you experience uterine contractions.  Avoid heavy lifting.  Do not exercise in extreme heat or humidity, or at high altitudes.  Wear low-heel, comfortable shoes.  Practice good posture.  You may continue to have sex unless your health care provider tells you otherwise. Relieving pain and discomfort  Take frequent breaks and rest with your legs elevated if you have leg cramps or low back pain.  Take warm sitz baths to soothe any pain or discomfort caused by hemorrhoids. Use hemorrhoid cream if your health care provider approves.  Wear a good support bra to prevent discomfort from breast tenderness.  If you develop varicose veins: ? Wear support pantyhose or compression stockings as told by your healthcare  provider. ? Elevate your feet for 15 minutes, 3-4 times a day. Prenatal care  Write down your questions. Take them to your prenatal visits.  Keep all your prenatal visits as told by your health care provider. This is important. Safety  Wear your seat belt at all times when driving.  Make a list of emergency phone numbers, including numbers for family, friends, the hospital, and police and fire departments. General instructions  Avoid cat litter boxes and soil used by cats. These carry germs that can cause birth defects in the baby. If you have a cat, ask someone to clean the litter box for you.  Do not travel far distances unless it is absolutely necessary and only with the approval of your health care provider.  Do not use hot tubs, steam rooms, or saunas.  Do not drink alcohol.  Do not use any products that contain nicotine or tobacco, such as cigarettes and e-cigarettes. If you need help quitting, ask your health care provider.  Do not use any medicinal herbs or unprescribed drugs. These chemicals affect the formation and growth of the baby.  Do not douche or use tampons or scented sanitary pads.  Do not cross your legs for long periods of time.  To prepare for the arrival of your baby: ? Take prenatal classes to understand, practice, and ask questions about labor and delivery. ? Make a trial run to the hospital. ? Visit the hospital and tour the maternity area. ? Arrange for maternity or paternity leave through employers. ? Arrange for family and friends to take care of pets while you are in the hospital. ? Purchase a rear-facing car seat and make sure you know how to install it in your car. ? Pack your hospital bag. ? Prepare the babys nursery. Make sure to remove all pillows and stuffed animals from the baby's crib to prevent suffocation.  Visit your dentist if you have not gone during your pregnancy. Use a soft toothbrush to brush your teeth and be gentle when you  floss. Contact a health care  provider if:  You are unsure if you are in labor or if your water has broken.  You become dizzy.  You have mild pelvic cramps, pelvic pressure, or nagging pain in your abdominal area.  You have lower back pain.  You have persistent nausea, vomiting, or diarrhea.  You have an unusual or bad smelling vaginal discharge.  You have pain when you urinate. Get help right away if:  Your water breaks before 37 weeks.  You have regular contractions less than 5 minutes apart before 37 weeks.  You have a fever.  You are leaking fluid from your vagina.  You have spotting or bleeding from your vagina.  You have severe abdominal pain or cramping.  You have rapid weight loss or weight gain.  You have shortness of breath with chest pain.  You notice sudden or extreme swelling of your face, hands, ankles, feet, or legs.  Your baby makes fewer than 10 movements in 2 hours.  You have severe headaches that do not go away when you take medicine.  You have vision changes. Summary  The third trimester is from week 28 through week 40, months 7 through 9. The third trimester is a time when the unborn baby (fetus) is growing rapidly.  During the third trimester, your discomfort may increase as you and your baby continue to gain weight. You may have abdominal, leg, and back pain, sleeping problems, and an increased need to urinate.  During the third trimester your breasts will keep growing and they will continue to become tender. A yellow fluid (colostrum) may leak from your breasts. This is the first milk you are producing for your baby.  False labor is a condition in which you feel small, irregular tightenings of the muscles in the womb (contractions) that eventually go away. These are called Braxton Hicks contractions. Contractions may last for hours, days, or even weeks before true labor sets in.  Signs of labor can include: abdominal cramps; regular  contractions that start at 10 minutes apart and become stronger and more frequent with time; watery or bloody mucus discharge that comes from the vagina; increased pelvic pressure and dull back pain; and leaking of amniotic fluid. This information is not intended to replace advice given to you by your health care provider. Make sure you discuss any questions you have with your health care provider. Document Released: 05/29/2001 Document Revised: 09/25/2018 Document Reviewed: 07/10/2016 Elsevier Patient Education  2020 [employer]

## 2019-02-27 NOTE — Progress Notes (Signed)
Stacey [name] CNM in earlier to discuss d/c plan. Written and verbal d/c instructions given and understanding voiced. 

## 2019-03-02 ENCOUNTER — Encounter: Payer: Self-pay | Admitting: Neurology

## 2019-03-02 ENCOUNTER — Other Ambulatory Visit: Payer: Self-pay

## 2019-03-02 ENCOUNTER — Ambulatory Visit: Payer: Medicaid Other | Admitting: Neurology

## 2019-03-02 VITALS — BP 102/62 | HR 100 | Temp 98.0°F | Ht 60.0 in | Wt 173.5 lb

## 2019-03-02 DIAGNOSIS — IMO0002 Reserved for concepts with insufficient information to code with codable children: Secondary | ICD-10-CM | POA: Insufficient documentation

## 2019-03-02 DIAGNOSIS — G43709 Chronic migraine without aura, not intractable, without status migrainosus: Secondary | ICD-10-CM

## 2019-03-02 MED ORDER — MAGNESIUM OXIDE -MG SUPPLEMENT 400 (240 MG) MG PO TABS: 400.0000 mg | ORAL_TABLET | Freq: Two times a day (BID) | ORAL | 11 refills | Status: DC

## 2019-03-02 MED ORDER — RIBOFLAVIN 100 MG PO TABS: 100.0000 mg | ORAL_TABLET | Freq: Two times a day (BID) | ORAL | 11 refills | Status: DC

## 2019-03-02 NOTE — Progress Notes (Signed)
PATIENT: Stacey [name] DOB: [birth year]  Chief Complaint  Patient presents with  . Migraine    She is currently pregnant with an EDD of 04/14/2019.  Reports headaches nearly every day.  She tries to lay down and nap for the milder headaches.  Fioricet works well for the more significant pain.  She has never been on any preventive medications in the past.   . PCP    Stacey Emery, MD  . OB-GYN    Stacey Bad, MD     HISTORICAL  Stacey [name] is a 28 year old female, seen in request by her PCP Dr. Earlie [name] and OB/GYN Dr. Clearance [name], Leonette Most for evaluation of migraine headache, initial evaluation was on March 02, 2019.  I have reviewed and summarized the referring note from the referring physician.  She is currently [redacted] weeks pregnant, migraine headaches since pregnancy, previously she was having headache about 2 twice each week, minute almost daily basis 2020, she has to take every 4 hours for 6 months, recently was given prescription of Fioricet which is helpful, quite her typical migraine as retro-orbital area moderate to severe headache with associated light, noise sensitivity, nauseous, lasting 4 to 6 hours, sleep was helpful.  She never tried preventive medication or triptan treatment in the past, she is a mother of 57 and 56 years old, expected due date for her boy is April 14, 2019.  Laboratory evaluation August 2020 showed negative HIV, RPR, CBC showed hemoglobin of 9.9, WBC of 12, CMP showed creatinine of 0.4,  REVIEW OF SYSTEMS: Full 14 system review of systems performed and notable only for as above All other review of systems were negative.  ALLERGIES: No Known Allergies  HOME MEDICATIONS: Current Outpatient Medications  Medication Sig Dispense Refill  . aspirin 81 MG chewable tablet Chew 1 tablet (81 mg total) by mouth daily. Stop taking at [redacted] weeks gestation. 90 tablet 2  . butalbital-acetaminophen-caffeine (FIORICET) 50-325-40 MG tablet Take 2 tablets  by mouth every 6 (six) hours as needed for headache. 14 tablet 0  . Elastic Bandages & Supports (COMFORT FIT MATERNITY SUPP LG) MISC 1 Units by Does not apply route daily. 1 each 0  . Elastic Bandages & Supports (COMFORT FIT MATERNITY SUPP SM) MISC WEAR AS DIRECTED. 1 each 0  . ferrous sulfate (FERROUSUL) 325 (65 FE) MG tablet Take 1 tablet (325 mg total) by mouth 2 (two) times daily. 60 tablet 2  . omeprazole (PRILOSEC) 20 MG capsule Take 1 capsule (20 mg total) by mouth 2 (two) times daily before a meal. 60 capsule 5  . Prenatal Vit-Fe Phos-FA-Omega (VITAFOL GUMMIES) 3.33-0.333-34.8 MG CHEW Chew 3 tablets by mouth daily before breakfast. 90 tablet 11   No current facility-administered medications for this visit.     PAST MEDICAL HISTORY: Past Medical History:  Diagnosis Date  . Alpha thalassemia silent carrier 10/11/2018   [x]  Genetic counseling [ ] FOB testing  . GERD (gastroesophageal reflux disease)   . 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 Kidney infection    kidney infection 2012  . Migraine     PAST SURGICAL HISTORY: Past Surgical History:  Procedure Laterality Date  . NO PAST SURGERIES      FAMILY HISTORY: Family History  Problem Relation Age of Onset  . Hypertension Mother   . Migraines Mother   . Asthma Brother   . Arthritis Maternal Grandmother   . Cancer Maternal Grandmother   . Arthritis Paternal  Grandmother   . Healthy Father   . Anesthesia problems Neg Hx     SOCIAL HISTORY: Social History   Socioeconomic History  . Marital status: Single    Spouse name: Not on file  . Number of children: 2  . Years of education: 78  . Highest education level: High school graduate  Occupational History  . Occupation: Therapist, art  Social Needs  . Financial resource strain: Not on file  . Food insecurity    Worry: Not on file    Inability: Not on file  . Transportation needs    Medical: Not on file    Non-medical: Not on file  Tobacco Use  . Smoking  status: Current Some Day Smoker    Types: Cigars    Last attempt to quit: 01/24/2017    Years since quitting: 2.1  . Smokeless tobacco: Never Used  . Tobacco comment: black and mild-half of one every other day  Substance and Sexual Activity  . Alcohol use: No    Frequency: Never  . Drug use: No  . Sexual activity: Yes    Birth control/protection: None    Comment: last IC-2 days ago   Lifestyle  . Physical activity    Days per week: Not on file    Minutes per session: Not on file  . Stress: Not on file  Relationships  . Social Herbalist on phone: Not on file    Gets together: Not on file    Attends religious service: Not on file    Active member of club or organization: Not on file    Attends meetings of clubs or organizations: Not on file    Relationship status: Not on file  . Intimate partner violence    Fear of current or ex partner: Not on file    Emotionally abused: Not on file    Physically abused: Not on file    Forced sexual activity: Not on file  Other Topics Concern  . Not on file  Social History Narrative   Lives at home with her children.   Right-handed.   Caffeine use: 2 cups per day.     PHYSICAL EXAM   Vitals:   03/02/19 1450  BP: 102/62  Pulse: 100  Temp: 98 F (36.7 C)  Weight: 173 lb 8 oz (78.7 kg)  Height: 5' (1.524 m)    Not recorded      Body mass index is 33.88 kg/m.  PHYSICAL EXAMNIATION:  Gen: NAD, conversant, well nourised, well groomed                     Cardiovascular: Regular rate rhythm, no peripheral edema, warm, nontender. Eyes: Conjunctivae clear without exudates or hemorrhage Neck: Supple, no carotid bruits. Pulmonary: Clear to auscultation bilaterally   NEUROLOGICAL EXAM:  MENTAL STATUS: Speech:    Speech is normal; fluent and spontaneous with normal comprehension.  Cognition:     Orientation to time, place and person     Normal recent and remote memory     Normal Attention span and concentration      Normal Language, naming, repeating,spontaneous speech     Fund of knowledge   CRANIAL NERVES: CN II: Visual fields are full to confrontation.  Pupils are round equal and briskly reactive to light. CN III, IV, VI: extraocular movement are normal. No ptosis. CN V: Facial sensation is intact to pinprick in all 3 divisions bilaterally. Corneal responses are intact.  CN  VII: Face is symmetric with normal eye closure and smile. CN VIII: Hearing is normal to causal conversation. CN IX, X: Palate elevates symmetrically. Phonation is normal. CN XI: Head turning and shoulder shrug are intact CN XII: Tongue is midline with normal movements and no atrophy.  MOTOR: There is no pronator drift of out-stretched arms. Muscle bulk and tone are normal. Muscle strength is normal.  REFLEXES: Reflexes are 2+ and symmetric at the biceps, triceps, knees, and ankles. Plantar responses are flexor.  SENSORY: Intact to light touch, pinprick, positional sensation and vibratory sensation are intact in fingers and toes.  COORDINATION: Rapid alternating movements and fine finger movements are intact. There is no dysmetria on finger-to-nose and heel-knee-shin.    GAIT/STANCE: Posture is normal. Gait is steady with normal steps, base, arm swing, and turning. Heel and toe walking are normal. Tandem gait is normal.  Romberg is absent.   DIAGNOSTIC DATA (LABS, IMAGING, TESTING) - I reviewed patient records, labs, notes, testing and imaging myself where available.   ASSESSMENT AND PLAN  Laurence SpatesDanasia J Burck is a 28 y.o. female   Chronic migraine  Worsening migraine during pregnancy  Likely a component of medicine rebound headache with her frequent Tylenol, Fioricet use, I have advised her to limit the treatment to less than 2 times each week  Add on preventive medication magnesium oxide 400 mg plus riboflavin 100 mg twice a day,  If she continue has frequent headaches in the next few weeks, may consider add on low-dose  propanolol 40 mg twice a day,    Levert FeinsteinYijun Ron Beske, M.D. Ph.D.  [facility] [address], [address] [place], [place] [address] Ph: [phone] Fax: [phone]  CC: Kerin RansomGarba, Mohammad L, Harper, Charles A, MD

## 2019-03-17 ENCOUNTER — Encounter: Payer: Self-pay | Admitting: Obstetrics

## 2019-03-17 ENCOUNTER — Ambulatory Visit (INDEPENDENT_AMBULATORY_CARE_PROVIDER_SITE_OTHER): Payer: Medicaid Other | Admitting: Obstetrics

## 2019-03-17 DIAGNOSIS — B373 Candidiasis of vulva and vagina: Secondary | ICD-10-CM

## 2019-03-17 DIAGNOSIS — B3731 Acute candidiasis of vulva and vagina: Secondary | ICD-10-CM

## 2019-03-17 DIAGNOSIS — Z8669 Personal history of other diseases of the nervous system and sense organs: Secondary | ICD-10-CM

## 2019-03-17 DIAGNOSIS — M9902 Segmental and somatic dysfunction of thoracic region: Secondary | ICD-10-CM

## 2019-03-17 DIAGNOSIS — Z3A36 36 weeks gestation of pregnancy: Secondary | ICD-10-CM

## 2019-03-17 DIAGNOSIS — O26893 Other specified pregnancy related conditions, third trimester: Secondary | ICD-10-CM

## 2019-03-17 DIAGNOSIS — A6004 Herpesviral vulvovaginitis: Secondary | ICD-10-CM

## 2019-03-17 MED ORDER — TERCONAZOLE 0.8 % VA CREA: 1.0000 | TOPICAL_CREAM | Freq: Every day | VAGINAL | 0 refills | Status: DC

## 2019-03-17 NOTE — Progress Notes (Signed)
Pt is on the phone preparing for virtual visit with provider. [redacted]w[redacted]d. Pt reports that she has no batteries for her BP cuff so she is unable to check BP today. Pt denies HA's, blurry vision or abdominal pain.

## 2019-03-17 NOTE — Progress Notes (Signed)
   [facility] VIRTUAL VIDEO VISIT ENCOUNTER NOTE  Provider location: [facility] [facility] [place]   I connected with Wille Celeste on 03/17/19 at  2:45 PM EDT by WebEx OB  Video Encounter at home and verified that I am speaking with the correct person using two identifiers.   I discussed the limitations, risks, security and privacy concerns of performing an evaluation and management service virtually and the availability of in person appointments. I also discussed with the patient that there may be a patient responsible charge related to this service. The patient expressed understanding and agreed to proceed. Subjective:  Stacey [name] is a 28 y.o. [id number] at [redacted]w[redacted]d being seen today for ongoing prenatal care.  She is currently monitored for the following issues for this low-risk pregnancy and has Smoker; Major depressive disorder, recurrent, severe without psychotic features ([place]); Herpes simplex vulvovaginitis; Supervision of other normal pregnancy, antepartum; Bacterial vaginosis; Abnormal chromosomal and genetic finding on antenatal screening mother; Alpha thalassemia silent carrier; Tobacco use during pregnancy; and Chronic migraine on their problem list.  Patient reports backache, vaginal irritation and pelvic pressure and pain.  Contractions: Irregular. Vag. Bleeding: None.  Movement: Present. Denies any leaking of fluid.   The following portions of the patient's history were reviewed and updated as appropriate: allergies, current medications, past family history, past medical history, past social history, past surgical history and problem list.   Objective:  There were no vitals filed for this visit.  Fetal Status:     Movement: Present     General:  Alert, oriented and cooperative. Patient is in no acute distress.  Respiratory: Normal respiratory effort, no problems with respiration noted  Mental Status: Normal mood and affect. Normal behavior. Normal  judgment and thought content.  Rest of physical exam deferred due to type of encounter  Imaging: No results found.  Assessment and Plan:  Pregnancy: [id number] at [redacted]w[redacted]d 1. Candida vaginitis Rx: - terconazole (TERAZOL 3) 0.8 % vaginal cream; Place 1 applicator vaginally at bedtime.  Dispense: 20 g; Refill: 0  2. Herpes simplex vulvovaginitis - Valtrex suppression   3. Somatic dysfunction of spine, thoracic - clinically stable  4. Hx of migraines - clinically stable   Preterm labor symptoms and general obstetric precautions including but not limited to vaginal bleeding, contractions, leaking of fluid and fetal movement were reviewed in detail with the patient. I discussed the assessment and treatment plan with the patient. The patient was provided an opportunity to ask questions and all were answered. The patient agreed with the plan and demonstrated an understanding of the instructions. The patient was advised to call back or seek an in-person office evaluation/go to [facility] at [facility] for any urgent or concerning symptoms. Please refer to After Visit Summary for other counseling recommendations.   I provided 10 minutes of face-to-face time during this encounter.  Return in about 1 week (around 03/24/2019) for ROB.  GBS.  Future Appointments  Date Time Provider [place]  05/27/2019  2:15 PM Suzzanne Cloud, NP [facility] None    Baltazar Najjar, [place] for [facility], [place] [facility] 03/17/2019

## 2019-03-24 ENCOUNTER — Other Ambulatory Visit: Payer: Self-pay

## 2019-03-24 ENCOUNTER — Ambulatory Visit (INDEPENDENT_AMBULATORY_CARE_PROVIDER_SITE_OTHER): Payer: Medicaid Other

## 2019-03-24 ENCOUNTER — Other Ambulatory Visit (HOSPITAL_COMMUNITY)
Admission: RE | Admit: 2019-03-24 | Discharge: 2019-03-24 | Disposition: A | Discharge status: Home / Self Care | Payer: Medicaid Other | Source: Ambulatory Visit

## 2019-03-24 VITALS — BP 104/69 | HR 87 | Wt 178.0 lb

## 2019-03-24 DIAGNOSIS — D649 Anemia, unspecified: Secondary | ICD-10-CM

## 2019-03-24 DIAGNOSIS — Z3480 Encounter for supervision of other normal pregnancy, unspecified trimester: Secondary | ICD-10-CM | POA: Diagnosis not present

## 2019-03-24 DIAGNOSIS — Z23 Encounter for immunization: Secondary | ICD-10-CM

## 2019-03-24 DIAGNOSIS — A6004 Herpesviral vulvovaginitis: Secondary | ICD-10-CM

## 2019-03-24 DIAGNOSIS — Z3483 Encounter for supervision of other normal pregnancy, third trimester: Secondary | ICD-10-CM

## 2019-03-24 DIAGNOSIS — Z3A37 37 weeks gestation of pregnancy: Secondary | ICD-10-CM

## 2019-03-24 MED ORDER — VALACYCLOVIR HCL 500 MG PO TABS: 500.0000 mg | ORAL_TABLET | Freq: Two times a day (BID) | ORAL | 1 refills | Status: DC

## 2019-03-24 NOTE — Progress Notes (Signed)
   PRENATAL VISIT NOTE  Subjective:  Stacey [name] is a 28 y.o. [id number] at [redacted]w[redacted]d who presents today for routine prenatal care.  She is currently being monitored for supervision of a low-risk pregnancy with problems as listed below.  Patient reports some pelvic pain that is constant in nature.  She reports that she has not yet began her valtrex and just started her yeast infection treatment last night.  However, patietn denies vaginal bleeding, leaking, itching, and burning. She endorses fetal movement and occasional contractions. She reports that her migraines have subsided with the cessation of smoking.  Patient requests cervical exam today and information for circumcision.   Patient Active Problem List   Diagnosis Date Noted  . Chronic migraine 03/02/2019  . Tobacco use during pregnancy 01/22/2019  . Abnormal chromosomal and genetic finding on antenatal screening mother 10/11/2018  . Alpha thalassemia silent carrier 10/11/2018  . Bacterial vaginosis 10/05/2018  . Supervision of other normal pregnancy, antepartum 09/03/2018  . 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 Major depressive disorder, recurrent, severe without psychotic features ([place])   . Smoker 07/04/2014    The following portions of the patient's history were reviewed and updated as appropriate: allergies, current medications, past family history, past medical history, past social history, past surgical history and problem list. Problem list updated.  Objective:   Vitals:   03/24/19 1008  BP: 104/69  Pulse: 87  Weight: 178 lb (80.7 kg)    Fetal Status: Fetal Heart Rate (bpm): 150 Fundal Height: 39 cm Movement: Present     General:  Alert, oriented and cooperative. Patient is in no acute distress.  Skin: Skin is warm and dry.   Cardiovascular: Regular rate and rhythm.  Respiratory: Normal respiratory effort. CTA-Bilaterally  Abdomen: Soft, gravid, appropriate for gestational age.  Pelvic: Cervical exam performed  Dilation: 1 Effacement (%): Thick Station: Ballotable  Extremities: Normal range of motion.  Edema: Trace  Mental Status: Normal mood and affect. Normal behavior. Normal judgment and thought content.   Assessment and Plan:  Pregnancy: [id number] at 103w0d  1. Supervision of other normal pregnancy, antepartum -Reviewed pelvic pain in pregnancy and comfort measures given -Reviewed PP Contraception and information for Nexplanon given.  -Congratulations given for smoking cessation.  -Anticipatory guidance for upcoming appts.  2. Anemia, unspecified type -Informed of Hgb of 9.9 -Will start on Iron supplementation BID; Rx at pharmacy from previous provider. -Discussed methods to increase iron in diet and absorbancy of supplements.  -Will repeat H&H today.  3. Herpes simplex vulvovaginitis -Rx for Valtrex sent to pharmacy on file.  -Instructed to start today.   Term labor symptoms and general obstetric precautions including but not limited to vaginal bleeding, contractions, leaking of fluid and fetal movement were reviewed with the patient.  Please refer to After Visit Summary for other counseling recommendations.  Return in about 1 week (around 03/31/2019) for  Wbex, LR-ROB.  Future Appointments  Date Time Provider [place]  05/27/2019  2:15 PM Suzzanne Cloud, NP [facility] None    Maryann Conners, CNM 03/24/2019, 10:36 AM

## 2019-03-24 NOTE — Patient Instructions (Addendum)
Etonogestrel implant What is this medicine? ETONOGESTREL (et oh noe JES trel) is a contraceptive (birth control) device. It is used to prevent pregnancy. It can be used for up to 3 years. This medicine may be used for other purposes; ask your health care provider or pharmacist if you have questions. COMMON BRAND NAME(S): Implanon, Nexplanon What should I tell my health care provider before I take this medicine? They need to know if you have any of these conditions:  abnormal vaginal bleeding  blood vessel disease or blood clots  breast, cervical, endometrial, ovarian, liver, or uterine cancer  diabetes  gallbladder disease  heart disease or recent heart attack  high blood pressure  high cholesterol or triglycerides  kidney disease  liver disease  migraine headaches  seizures  stroke  tobacco smoker  an unusual or allergic reaction to etonogestrel, anesthetics or antiseptics, other medicines, foods, dyes, or preservatives  pregnant or trying to get pregnant  breast-feeding How should I use this medicine? This device is inserted just under the skin on the inner side of your upper arm by a health care professional. Talk to your pediatrician regarding the use of this medicine in children. Special care may be needed. Overdosage: If you think you have taken too much of this medicine contact a poison control center or emergency room at once. NOTE: This medicine is only for you. Do not share this medicine with others. What if I miss a dose? This does not apply. What may interact with this medicine? Do not take this medicine with any of the following medications:  amprenavir  fosamprenavir This medicine may also interact with the following medications:  acitretin  aprepitant  armodafinil  bexarotene  bosentan  carbamazepine  certain medicines for fungal infections like fluconazole, ketoconazole, itraconazole and voriconazole  certain medicines to treat  hepatitis, HIV or AIDS  cyclosporine  felbamate  griseofulvin  lamotrigine  modafinil  oxcarbazepine  phenobarbital  phenytoin  primidone  rifabutin  rifampin  rifapentine  St. John's wort  topiramate This list may not describe all possible interactions. Give your health care provider a list of all the medicines, herbs, non-prescription drugs, or dietary supplements you use. Also tell them if you smoke, drink alcohol, or use illegal drugs. Some items may interact with your medicine. What should I watch for while using this medicine? This product does not protect you against HIV infection (AIDS) or other sexually transmitted diseases. You should be able to feel the implant by pressing your fingertips over the skin where it was inserted. Contact your doctor if you cannot feel the implant, and use a non-hormonal birth control method (such as condoms) until your doctor confirms that the implant is in place. Contact your doctor if you think that the implant may have broken or become bent while in your arm. You will receive a user card from your health care provider after the implant is inserted. The card is a record of the location of the implant in your upper arm and when it should be removed. Keep this card with your health records. What side effects may I notice from receiving this medicine? Side effects that you should report to your doctor or health care professional as soon as possible:  allergic reactions like skin rash, itching or hives, swelling of the face, lips, or tongue  breast lumps, breast tissue changes, or discharge  breathing problems  changes in emotions or moods  if you feel that the implant may have broken or   bent while in your arm  high blood pressure  pain, irritation, swelling, or bruising at the insertion site  scar at site of insertion  signs of infection at the insertion site such as fever, and skin redness, pain or discharge  signs and  symptoms of a blood clot such as breathing problems; changes in vision; chest pain; severe, sudden headache; pain, swelling, warmth in the leg; trouble speaking; sudden numbness or weakness of the face, arm or leg  signs and symptoms of liver injury like dark yellow or brown urine; general ill feeling or flu-like symptoms; light-colored stools; loss of appetite; nausea; right upper belly pain; unusually weak or tired; yellowing of the eyes or skin  unusual vaginal bleeding, discharge Side effects that usually do not require medical attention (report to your doctor or health care professional if they continue or are bothersome):  acne  breast pain or tenderness  headache  irregular menstrual bleeding  nausea This list may not describe all possible side effects. Call your doctor for medical advice about side effects. You may report side effects to FDA at 1-800-FDA-1088. Where should I keep my medicine? This drug is given in a hospital or clinic and will not be stored at home. NOTE: This sheet is a summary. It may not cover all possible information. If you have questions about this medicine, talk to your doctor, pharmacist, or health care provider.  2020 Elsevier/Gold Standard (2017-04-23 14:11:42)  Iron-Rich Diet  Iron is a mineral that helps your body to produce hemoglobin. Hemoglobin is a protein in red blood cells that carries oxygen to your body's tissues. Eating too little iron may cause you to feel weak and tired, and it can increase your risk of infection. Iron is naturally found in many foods, and many foods have iron added to them (iron-fortified foods). You may need to follow an iron-rich diet if you do not have enough iron in your body due to certain medical conditions. The amount of iron that you need each day depends on your age, your sex, and any medical conditions you have. Follow instructions from your health care provider or a diet and nutrition specialist (dietitian) about  how much iron you should eat each day. What are tips for following this plan? Reading food labels  Check food labels to see how many milligrams (mg) of iron are in each serving. Cooking  Cook foods in pots and pans that are made from iron.  Take these steps to make it easier for your body to absorb iron from certain foods: ? Soak beans overnight before cooking. ? Soak whole grains overnight and drain them before using. ? Ferment flours before baking, such as by using yeast in bread dough. Meal planning  When you eat foods that contain iron, you should eat them with foods that are high in vitamin C. These include oranges, peppers, tomatoes, potatoes, and mango. Vitamin C helps your body to absorb iron. General information  Take iron supplements only as told by your health care provider. An overdose of iron can be life-threatening. If you were prescribed iron supplements, take them with orange juice or a vitamin C supplement.  When you eat iron-fortified foods or take an iron supplement, you should also eat foods that naturally contain iron, such as meat, poultry, and fish. Eating naturally iron-rich foods helps your body to absorb the iron that is added to other foods or contained in a supplement.  Certain foods and drinks prevent your body from absorbing  iron properly. Avoid eating these foods in the same meal as iron-rich foods or with iron supplements. These foods include: ? Coffee, black tea, and red wine. ? Milk, dairy products, and foods that are high in calcium. ? Beans and soybeans. ? Whole grains. What foods should I eat? Fruits Prunes. Raisins. Eat fruits high in vitamin C, such as oranges, grapefruits, and strawberries, alongside iron-rich foods. Vegetables Spinach (cooked). Green peas. Broccoli. Fermented vegetables. Eat vegetables high in vitamin C, such as leafy greens, potatoes, bell peppers, and tomatoes, alongside iron-rich foods. Grains Iron-fortified breakfast  cereal. Iron-fortified whole-wheat bread. Enriched rice. Sprouted grains. Meats and other proteins Beef liver. Oysters. Beef. Shrimp. [place]. Chicken. [place]. Sardines. Chickpeas. Nuts. Tofu. Pumpkin seeds. Beverages Tomato juice. Fresh orange juice. Prune juice. Hibiscus tea. Fortified instant breakfast shakes. Sweets and desserts Blackstrap molasses. Seasonings and condiments Tahini. Fermented soy sauce. Other foods Wheat germ. The items listed above may not be a complete list of recommended foods and beverages. Contact a dietitian for more information. What foods should I avoid? Grains Whole grains. Bran cereal. Bran flour. Oats. Meats and other proteins Soybeans. Products made from soy protein. Black beans. Lentils. Mung beans. Split peas. Dairy Milk. Cream. Cheese. Yogurt. Cottage cheese. Beverages Coffee. Black tea. Red wine. Sweets and desserts Cocoa. Chocolate. Ice cream. Other foods Basil. Oregano. Large amounts of parsley. The items listed above may not be a complete list of foods and beverages to avoid. Contact a dietitian for more information. Summary  Iron is a mineral that helps your body to produce hemoglobin. Hemoglobin is a protein in red blood cells that carries oxygen to your body's tissues.  Iron is naturally found in many foods, and many foods have iron added to them (iron-fortified foods).  When you eat foods that contain iron, you should eat them with foods that are high in vitamin C. Vitamin C helps your body to absorb iron.  Certain foods and drinks prevent your body from absorbing iron properly, such as whole grains and dairy products. You should avoid eating these foods in the same meal as iron-rich foods or with iron supplements. This information is not intended to replace advice given to you by your health care provider. Make sure you discuss any questions you have with your health care provider. Document Released: 01/16/2005 Document Revised: 05/17/2017  Document Reviewed: 04/30/2017 Elsevier Patient Education  2020 [employer].

## 2019-03-24 NOTE — Progress Notes (Signed)
Patient reports fetal movement with irregular contractions. Pt wants tdap vaccine today..  

## 2019-03-25 LAB — HEMOGLOBIN AND HEMATOCRIT, BLOOD
Hematocrit: 31.3 % — ABNORMAL LOW (ref 34.0–46.6)
Hemoglobin: 10.2 g/dL — ABNORMAL LOW (ref 11.1–15.9)

## 2019-03-26 LAB — STREP GP B NAA: Strep Gp B NAA: POSITIVE — AB

## 2019-03-27 DIAGNOSIS — B951 Streptococcus, group B, as the cause of diseases classified elsewhere: Secondary | ICD-10-CM | POA: Insufficient documentation

## 2019-03-31 ENCOUNTER — Encounter: Payer: Self-pay | Admitting: Obstetrics

## 2019-03-31 ENCOUNTER — Ambulatory Visit (INDEPENDENT_AMBULATORY_CARE_PROVIDER_SITE_OTHER): Payer: Medicaid Other | Admitting: Obstetrics

## 2019-03-31 DIAGNOSIS — Z3A38 38 weeks gestation of pregnancy: Secondary | ICD-10-CM

## 2019-03-31 DIAGNOSIS — Z3483 Encounter for supervision of other normal pregnancy, third trimester: Secondary | ICD-10-CM

## 2019-03-31 DIAGNOSIS — Z3480 Encounter for supervision of other normal pregnancy, unspecified trimester: Secondary | ICD-10-CM

## 2019-03-31 NOTE — Progress Notes (Signed)
   [place] VIRTUAL VIDEO VISIT ENCOUNTER NOTE  Provider location: [facility] [place]   I connected with Stacey [name] on 03/31/19 at  2:15 PM EDT by WebEx OB Video Encounter at home and verified that I am speaking with the correct person using two identifiers.   I discussed the limitations, risks, security and privacy concerns of performing an evaluation and management service virtually and the availability of in person appointments. I also discussed with the patient that there may be a patient responsible charge related to this service. The patient expressed understanding and agreed to proceed. Subjective:  Stacey [name] is a 28 y.o. [id number] at [redacted]w[redacted]d being seen today for ongoing prenatal care.  She is currently monitored for the following issues for this low-risk pregnancy and has Smoker; Major depressive disorder, recurrent, severe without psychotic features ([place]); Herpes simplex vulvovaginitis; Supervision of other normal pregnancy, antepartum; Bacterial vaginosis; Abnormal chromosomal and genetic finding on antenatal screening mother; Alpha thalassemia silent carrier; Tobacco use during pregnancy; Chronic migraine; and Group beta Strep positive on their problem list.  Patient reports backache.  Contractions: Irregular. Vag. Bleeding: None.  Movement: Present. Denies any leaking of fluid.   The following portions of the patient's history were reviewed and updated as appropriate: allergies, current medications, past family history, past medical history, past social history, past surgical history and problem list.   Objective:  There were no vitals filed for this visit.  Fetal Status:     Movement: Present     General:  Alert, oriented and cooperative. Patient is in no acute distress.  Respiratory: Normal respiratory effort, no problems with respiration noted  Mental Status: Normal mood and affect. Normal behavior. Normal judgment and thought  content.  Rest of physical exam deferred due to type of encounter  Imaging: No results found.  Assessment and Plan:  Pregnancy: [id number] at [redacted]w[redacted]d 1. Supervision of other normal pregnancy, antepartum   Term labor symptoms and general obstetric precautions including but not limited to vaginal bleeding, contractions, leaking of fluid and fetal movement were reviewed in detail with the patient. I discussed the assessment and treatment plan with the patient. The patient was provided an opportunity to ask questions and all were answered. The patient agreed with the plan and demonstrated an understanding of the instructions. The patient was advised to call back or seek an in-person office evaluation/go to MAU at [facility] for any urgent or concerning symptoms. Please refer to After Visit Summary for other counseling recommendations.   I provided 10 minutes of face-to-face time during this encounter.  Return in about 1 week (around 04/07/2019) for ROB.  Future Appointments  Date Time Provider [place]  05/27/2019  2:15 PM Suzzanne Cloud, NP [facility] None    Baltazar Najjar, [place] for [facility], [place] [facility] 03/31/2019

## 2019-03-31 NOTE — Progress Notes (Signed)
S/w ptr for webex visit, pt reports fetal movement with irregular contractions. Pt states she does not have BP cuff with her today.

## 2019-04-02 LAB — CERVICOVAGINAL ANCILLARY ONLY
Chlamydia: NEGATIVE
Comment: NEGATIVE
Comment: NORMAL
Neisseria Gonorrhea: NEGATIVE

## 2019-04-08 ENCOUNTER — Other Ambulatory Visit: Payer: Self-pay

## 2019-04-08 ENCOUNTER — Ambulatory Visit (INDEPENDENT_AMBULATORY_CARE_PROVIDER_SITE_OTHER): Payer: Medicaid Other | Admitting: Nurse Practitioner

## 2019-04-08 VITALS — BP 108/71 | HR 94 | Wt 180.0 lb

## 2019-04-08 DIAGNOSIS — O99330 Smoking (tobacco) complicating pregnancy, unspecified trimester: Secondary | ICD-10-CM

## 2019-04-08 DIAGNOSIS — Z87891 Personal history of nicotine dependence: Secondary | ICD-10-CM

## 2019-04-08 DIAGNOSIS — O99333 Smoking (tobacco) complicating pregnancy, third trimester: Secondary | ICD-10-CM

## 2019-04-08 DIAGNOSIS — Z3A39 39 weeks gestation of pregnancy: Secondary | ICD-10-CM

## 2019-04-08 DIAGNOSIS — Z3480 Encounter for supervision of other normal pregnancy, unspecified trimester: Secondary | ICD-10-CM

## 2019-04-08 DIAGNOSIS — O26843 Uterine size-date discrepancy, third trimester: Secondary | ICD-10-CM

## 2019-04-08 NOTE — Progress Notes (Signed)
ROB +GBS  CC: Pain / Pressure request cervix check.

## 2019-04-08 NOTE — Progress Notes (Signed)
    Subjective:  Stacey [name] is a 28 y.o. [id number] at [redacted]w[redacted]d being seen today for ongoing prenatal care.  She is currently monitored for the following issues for this low-risk pregnancy and has Smoker; Major depressive disorder, recurrent, severe without psychotic features ([place]); Hx of preeclampsia, prior pregnancy, currently pregnant, third trimester; Herpes simplex vulvovaginitis; Supervision of other normal pregnancy, antepartum; Bacterial vaginosis; Abnormal chromosomal and genetic finding on antenatal screening mother; Alpha thalassemia silent carrier; Tobacco use during pregnancy; Chronic migraine; and Group beta Strep positive on their problem list.  Patient reports no complaints.  Contractions: Irritability. Vag. Bleeding: None.  Movement: Present. Denies leaking of fluid.   The following portions of the patient's history were reviewed and updated as appropriate: allergies, current medications, past family history, past medical history, past social history, past surgical history and problem list. Problem list updated.  Objective:   Vitals:   04/08/19 1600  BP: 108/71  Pulse: 94  Weight: 180 lb (81.6 kg)    Fetal Status: Fetal Heart Rate (bpm): 148 Fundal Height: 42 cm Movement: Present     General:  Alert, oriented and cooperative. Patient is in no acute distress.  Skin: Skin is warm and dry. No rash noted.   Cardiovascular: Normal heart rate noted  Respiratory: Normal respiratory effort, no problems with respiration noted  Abdomen: Soft, gravid, appropriate for gestational age. Pain/Pressure: Present     Pelvic:  Cervical exam performed Dilation: 1 Effacement (%): Thick Station: -3  Extremities: Normal range of motion.  Edema: Trace  Mental Status: Normal mood and affect. Normal behavior. Normal judgment and thought content.   Urinalysis:      Assessment and Plan:  Pregnancy: [id number] at [redacted]w[redacted]d  1.  Supervision of pregnancy Induction at 41 weeks planned - Discussed foley  bulb the day before induction. Will fax order and get date for client.  2. Uterine size date discrepancy pregnancy, third trimester Measures large - will get [place] for interval growth and AFI  - [place] MFM OB FOLLOW UP; Future  Term labor symptoms and general obstetric precautions including but not limited to vaginal bleeding, contractions, leaking of fluid and fetal movement were reviewed in detail with the patient. Please refer to After Visit Summary for other counseling recommendations.  Return in about 1 week (around 04/15/2019) for in person ROB.  Earlie Server, RN, MSN, NP-BC Nurse Practitioner, [facility] for [employer], [place] [facility] 04/08/2019 5:07 PM

## 2019-04-09 ENCOUNTER — Telehealth (HOSPITAL_COMMUNITY): Payer: Self-pay | Admitting: *Deleted

## 2019-04-09 ENCOUNTER — Encounter (HOSPITAL_COMMUNITY): Payer: Self-pay | Admitting: *Deleted

## 2019-04-09 NOTE — Telephone Encounter (Signed)
Preadmission screen  

## 2019-04-12 ENCOUNTER — Inpatient Hospital Stay (HOSPITAL_COMMUNITY): Payer: Medicaid Other | Admitting: Anesthesiology

## 2019-04-12 ENCOUNTER — Encounter (HOSPITAL_COMMUNITY): Payer: Self-pay | Admitting: *Deleted

## 2019-04-12 ENCOUNTER — Other Ambulatory Visit: Payer: Self-pay

## 2019-04-12 ENCOUNTER — Inpatient Hospital Stay (HOSPITAL_COMMUNITY)
Admission: AD | Admit: 2019-04-12 | Discharge: 2019-04-15 | DRG: 806 | Disposition: A | Discharge status: Home / Self Care | Payer: Medicaid Other | Attending: Obstetrics and Gynecology | Admitting: Obstetrics and Gynecology

## 2019-04-12 DIAGNOSIS — Z20828 Contact with and (suspected) exposure to other viral communicable diseases: Secondary | ICD-10-CM | POA: Diagnosis present

## 2019-04-12 DIAGNOSIS — O9832 Other infections with a predominantly sexual mode of transmission complicating childbirth: Secondary | ICD-10-CM | POA: Diagnosis not present

## 2019-04-12 DIAGNOSIS — O9962 Diseases of the digestive system complicating childbirth: Secondary | ICD-10-CM | POA: Diagnosis not present

## 2019-04-12 DIAGNOSIS — Z8759 Personal history of other complications of pregnancy, childbirth and the puerperium: Secondary | ICD-10-CM

## 2019-04-12 DIAGNOSIS — Z87891 Personal history of nicotine dependence: Secondary | ICD-10-CM | POA: Diagnosis not present

## 2019-04-12 DIAGNOSIS — Z3A39 39 weeks gestation of pregnancy: Secondary | ICD-10-CM | POA: Diagnosis not present

## 2019-04-12 DIAGNOSIS — K219 Gastro-esophageal reflux disease without esophagitis: Secondary | ICD-10-CM | POA: Diagnosis not present

## 2019-04-12 DIAGNOSIS — O26893 Other specified pregnancy related conditions, third trimester: Secondary | ICD-10-CM | POA: Diagnosis not present

## 2019-04-12 DIAGNOSIS — A6000 Herpesviral infection of urogenital system, unspecified: Secondary | ICD-10-CM | POA: Diagnosis not present

## 2019-04-12 DIAGNOSIS — O99824 Streptococcus B carrier state complicating childbirth: Secondary | ICD-10-CM | POA: Diagnosis not present

## 2019-04-12 DIAGNOSIS — O09299 Supervision of pregnancy with other poor reproductive or obstetric history, unspecified trimester: Secondary | ICD-10-CM

## 2019-04-12 LAB — CBC
HCT: 33.1 % — ABNORMAL LOW (ref 36.0–46.0)
Hemoglobin: 11 g/dL — ABNORMAL LOW (ref 12.0–15.0)
MCH: 26.5 pg (ref 26.0–34.0)
MCHC: 33.2 g/dL (ref 30.0–36.0)
MCV: 79.8 fL — ABNORMAL LOW (ref 80.0–100.0)
Platelets: 288 10*3/uL (ref 150–400)
RBC: 4.15 MIL/uL (ref 3.87–5.11)
RDW: 14.7 % (ref 11.5–15.5)
WBC: 9.3 10*3/uL (ref 4.0–10.5)
nRBC: 0 % (ref 0.0–0.2)

## 2019-04-12 LAB — TYPE AND SCREEN
ABO/RH(D): A POS
Antibody Screen: NEGATIVE

## 2019-04-12 LAB — ABO/RH: ABO/RH(D): A POS

## 2019-04-12 LAB — SARS CORONAVIRUS 2 BY RT PCR (HOSPITAL ORDER, PERFORMED IN ~~LOC~~ HOSPITAL LAB): SARS Coronavirus 2: NEGATIVE

## 2019-04-12 MED ORDER — OXYTOCIN 40 UNITS IN NORMAL SALINE INFUSION - SIMPLE MED
2.5000 [IU]/h | INTRAVENOUS | Status: DC
  Filled 2019-04-12: qty 1000

## 2019-04-12 MED ORDER — LACTATED RINGERS IV SOLN: 500.0000 mL | Freq: Once | INTRAVENOUS | Status: DC

## 2019-04-12 MED ORDER — LACTATED RINGERS IV SOLN
500.0000 mL | Freq: Once | INTRAVENOUS | Status: AC
  Administered 2019-04-12: 500 mL via INTRAVENOUS

## 2019-04-12 MED ORDER — FENTANYL CITRATE (PF) 100 MCG/2ML IJ SOLN
INTRAMUSCULAR | Status: DC | PRN
  Administered 2019-04-12: 15 ug via INTRATHECAL

## 2019-04-12 MED ORDER — PHENYLEPHRINE 40 MCG/ML (10ML) SYRINGE FOR IV PUSH (FOR BLOOD PRESSURE SUPPORT): 80.0000 ug | PREFILLED_SYRINGE | INTRAVENOUS | Status: DC | PRN

## 2019-04-12 MED ORDER — SODIUM CHLORIDE 0.9 % IV SOLN
5.0000 10*6.[IU] | Freq: Once | INTRAVENOUS | Status: AC
  Administered 2019-04-12: 18:00:00 5 10*6.[IU] via INTRAVENOUS
  Filled 2019-04-12: qty 5

## 2019-04-12 MED ORDER — PENICILLIN G 3 MILLION UNITS IVPB - SIMPLE MED
3.0000 10*6.[IU] | INTRAVENOUS | Status: DC
  Administered 2019-04-12 – 2019-04-13 (×2): 3 10*6.[IU] via INTRAVENOUS
  Filled 2019-04-12 (×2): qty 100

## 2019-04-12 MED ORDER — ACETAMINOPHEN 325 MG PO TABS: 650.0000 mg | ORAL_TABLET | ORAL | Status: DC | PRN

## 2019-04-12 MED ORDER — OXYCODONE-ACETAMINOPHEN 5-325 MG PO TABS: 1.0000 | ORAL_TABLET | ORAL | Status: DC | PRN

## 2019-04-12 MED ORDER — LIDOCAINE HCL (PF) 1 % IJ SOLN
INTRAMUSCULAR | Status: DC | PRN
  Administered 2019-04-12 (×2): 4 mL via EPIDURAL

## 2019-04-12 MED ORDER — SOD CITRATE-CITRIC ACID 500-334 MG/5ML PO SOLN: 30.0000 mL | ORAL | Status: DC | PRN

## 2019-04-12 MED ORDER — FENTANYL-BUPIVACAINE-NACL 0.5-0.125-0.9 MG/250ML-% EP SOLN: 12.0000 mL/h | EPIDURAL | Status: DC | PRN

## 2019-04-12 MED ORDER — ONDANSETRON HCL 4 MG/2ML IJ SOLN: 4.0000 mg | Freq: Four times a day (QID) | INTRAMUSCULAR | Status: DC | PRN

## 2019-04-12 MED ORDER — DIPHENHYDRAMINE HCL 50 MG/ML IJ SOLN: 12.5000 mg | INTRAMUSCULAR | Status: DC | PRN

## 2019-04-12 MED ORDER — EPHEDRINE 5 MG/ML INJ: 10.0000 mg | INTRAVENOUS | Status: DC | PRN

## 2019-04-12 MED ORDER — SODIUM CHLORIDE (PF) 0.9 % IJ SOLN
INTRAMUSCULAR | Status: DC | PRN
  Administered 2019-04-12: 10 mL/h via EPIDURAL

## 2019-04-12 MED ORDER — TERBUTALINE SULFATE 1 MG/ML IJ SOLN: 0.2500 mg | Freq: Once | INTRAMUSCULAR | Status: DC | PRN

## 2019-04-12 MED ORDER — OXYCODONE-ACETAMINOPHEN 5-325 MG PO TABS: 2.0000 | ORAL_TABLET | ORAL | Status: DC | PRN

## 2019-04-12 MED ORDER — SODIUM CHLORIDE 0.9 % IV SOLN
2.0000 g | Freq: Four times a day (QID) | INTRAVENOUS | Status: DC
  Administered 2019-04-12: 2 g via INTRAVENOUS
  Filled 2019-04-12: qty 2000

## 2019-04-12 MED ORDER — OXYTOCIN 40 UNITS IN NORMAL SALINE INFUSION - SIMPLE MED
1.0000 m[IU]/min | INTRAVENOUS | Status: DC
  Administered 2019-04-12: 23:00:00 2 m[IU]/min via INTRAVENOUS

## 2019-04-12 MED ORDER — OXYTOCIN BOLUS FROM INFUSION
500.0000 mL | Freq: Once | INTRAVENOUS | Status: AC
  Administered 2019-04-13: 05:00:00 500 mL via INTRAVENOUS

## 2019-04-12 MED ORDER — LIDOCAINE HCL (PF) 1 % IJ SOLN: 30.0000 mL | INTRAMUSCULAR | Status: DC | PRN

## 2019-04-12 MED ORDER — LACTATED RINGERS IV SOLN
INTRAVENOUS | Status: DC
  Administered 2019-04-12: via INTRAVENOUS

## 2019-04-12 MED ORDER — LACTATED RINGERS IV SOLN: 500.0000 mL | INTRAVENOUS | Status: DC | PRN

## 2019-04-12 MED ORDER — FENTANYL-BUPIVACAINE-NACL 0.5-0.125-0.9 MG/250ML-% EP SOLN
EPIDURAL | Status: AC
  Filled 2019-04-12: qty 250

## 2019-04-12 MED ORDER — FENTANYL CITRATE (PF) 100 MCG/2ML IJ SOLN
INTRAMUSCULAR | Status: AC
  Filled 2019-04-12: qty 2

## 2019-04-12 NOTE — MAU Note (Signed)
LARON ANGELINI is a 28 y.o. at [redacted]w[redacted]d here in MAU reporting: contractions since 0930, they are coming every 2 minutes. No bleeding, no LOF. Decreased fetal movement today. Last week was 1 cm  Onset of complaint: today  Pain score: 10/10  Vitals:   04/12/19 1228  BP: 130/70  Pulse: 99  Resp: 20  Temp: 98.1 F (36.7 C)  SpO2: 100%     FHT: 150  Lab orders placed from triage: none

## 2019-04-12 NOTE — H&P (Addendum)
OBSTETRIC ADMISSION HISTORY AND PHYSICAL  Stacey [name] is a 28 y.o. female [phone] with IUP at [redacted]w[redacted]d by U/S presenting for SOL. She reports +FMs, No LOF, no VB, no blurry vision, headaches or peripheral edema, and RUQ pain.  She plans on breast and bottle feeding. She requests Nexplanon for birth control. She [facility]   Dating: By U/S --->  Estimated Date of Delivery: 04/14/19  Pt measured 42cm at prenatal visit on 04/08/19 at [redacted]w[redacted]d. Presented in labor before repeat U/S could be completed  Prenatal History/Complications: - Pre eclampsia with prior pregnancies - smoker - GBS positive  Past Medical History: Past Medical History:  Diagnosis Date  . Alpha thalassemia silent carrier 10/11/2018   [x]  Genetic counseling [ ] FOB testing  . GERD (gastroesophageal reflux disease)   . 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 Kidney infection    kidney infection 2012  . Migraine     Past Surgical History: Past Surgical History:  Procedure Laterality Date  . NO PAST SURGERIES      Obstetrical History: OB History    Gravida  6   Para  2   Term  2   Preterm  0   AB  3   Living  2     SAB  0   TAB  2   Ectopic  0   Multiple  0   Live Births  2           Social History Social History   Socioeconomic History  . Marital status: Single    Spouse name: Not on file  . Number of children: 2  . Years of education: 52  . Highest education level: High school graduate  Occupational History  . Occupation: Therapist, art  Social Needs  . Financial resource strain: Not on file  . Food insecurity    Worry: Not on file    Inability: Not on file  . Transportation needs    Medical: Not on file    Non-medical: Not on file  Tobacco Use  . Smoking status: Former Smoker    Types: Cigars    Quit date: 01/25/2019    Years since quitting: 0.2  . Smokeless tobacco: Never Used  . Tobacco comment: black and mild-half of one every other day   Substance and Sexual Activity  . Alcohol use: No    Frequency: Never  . Drug use: No  . Sexual activity: Yes    Birth control/protection: None    Comment: last IC-2 days ago   Lifestyle  . Physical activity    Days per week: Not on file    Minutes per session: Not on file  . Stress: Not on file  Relationships  . Social Herbalist on phone: Not on file    Gets together: Not on file    Attends religious service: Not on file    Active member of club or organization: Not on file    Attends meetings of clubs or organizations: Not on file    Relationship status: Not on file  Other Topics Concern  . Not on file  Social History Narrative   Lives at home with her children.   Right-handed.   Caffeine use: 2 cups per day.    Family History: Family History  Problem Relation Age of Onset  . Hypertension Mother   . Migraines Mother   . Asthma Brother   . Arthritis  Maternal Grandmother   . Cancer Maternal Grandmother   . Arthritis Paternal Grandmother   . Healthy Father   . Anesthesia problems Neg Hx     Allergies: No Known Allergies  Medications Prior to Admission  Medication Sig Dispense Refill Last Dose  . Elastic Bandages & Supports (COMFORT FIT MATERNITY SUPP LG) MISC 1 Units by Does not apply route daily. 1 each 0   . omeprazole (PRILOSEC) 20 MG capsule Take 1 capsule (20 mg total) by mouth 2 (two) times daily before a meal. 60 capsule 5   . Prenatal Vit-Fe Phos-FA-Omega (VITAFOL GUMMIES) 3.33-0.333-34.8 MG CHEW Chew 3 tablets by mouth daily before breakfast. 90 tablet 11   . valACYclovir (VALTREX) 500 MG tablet Take 1 tablet (500 mg total) by mouth 2 (two) times daily. 60 tablet 1      Review of Systems   All systems reviewed and negative except as stated in HPI  Blood pressure 123/73, pulse 87, temperature 97.8 F (36.6 C), temperature source Oral, resp. rate 20, height 5' (1.524 m), weight 82.5 kg, last menstrual period 06/23/2018, SpO2 100 %, currently  breastfeeding. General appearance: alert, cooperative and no distress Lungs: clear to auscultation bilaterally Heart: regular rate and rhythm Abdomen: soft, non-tender; bowel sounds normal Extremities: Homans sign is negative, no sign of DVT DTR's  Presentation: cephalic Fetal monitoringBaseline: 150 bpm, Variability: Good {> 6 bpm) and Accelerations: Reactive Uterine activityFrequency: Every 2-3 minutes Dilation: 7 Effacement (%): 90 Station: -1 Exam by:: Henderson NewcomerStephanie Faulk, RN   Prenatal labs: ABO, Rh: A/Positive/-- (03/18 1108) Antibody: Negative (03/18 1108) Rubella: 1.58 (03/18 1108) RPR: Non Reactive (08/06 0910)  HBsAg: Negative (03/18 1108)  HIV: Non Reactive (08/06 0910)  GBS: --Lottie Dawson/Positive (10/06 1037)  1 hr Glucola normal Genetic screening negative Anatomy US 11/18/18  Prenatal Transfer Tool  Maternal Diabetes: No Genetic Screening: Normal Maternal Ultrasounds/Referrals: Normal Fetal Ultrasounds or other Referrals:  Referred to Materal Fetal Medicine  Maternal Substance Abuse:  No Significant Maternal Medications:  Meds include: Other: Prilosec and Valtrex Significant Maternal Lab Results: Group B Strep positive  Results for orders placed or performed during the hospital encounter of 04/12/19 (from the past 24 hour(s))  SARS Coronavirus 2 by RT PCR (hospital order, [facility]) Nasopharyngeal Nasopharyngeal Swab   Collection Time: 04/12/19 12:41 PM   Specimen: Nasopharyngeal Swab  Result Value Ref Range   SARS Coronavirus 2 NEGATIVE NEGATIVE  CBC   Collection Time: 04/12/19 12:45 PM  Result Value Ref Range   WBC 9.3 4.0 - 10.5 K/uL   RBC 4.15 3.87 - 5.11 MIL/uL   Hemoglobin 11.0 (L) 12.0 - 15.0 g/dL   HCT 16.133.1 (L) 09.636.0 - 04.546.0 %   MCV 79.8 (L) 80.0 - 100.0 fL   MCH 26.5 26.0 - 34.0 pg   MCHC 33.2 30.0 - 36.0 g/dL   RDW 40.914.7 81.111.5 - 91.415.5 %   Platelets 288 150 - 400 K/uL   nRBC 0.0 0.0 - 0.2 %    Patient Active Problem List    Diagnosis Date Noted  . Indication for care in labor and delivery, antepartum 04/12/2019  . Group beta Strep positive 03/27/2019  . Chronic migraine 03/02/2019  . Tobacco use during pregnancy 01/22/2019  . Abnormal chromosomal and genetic finding on antenatal screening mother 10/11/2018  . Alpha thalassemia silent carrier 10/11/2018  . Bacterial vaginosis 10/05/2018  . Supervision of other normal pregnancy, antepartum 09/03/2018  . Hx of preeclampsia, prior pregnancy, currently pregnant, third  trimester 11/09/2016  . 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 Major depressive disorder, recurrent, severe without psychotic features ([facility])   . Former smoker 07/04/2014    Assessment/Plan:  GLORINE HANRATTY is a 28 y.o. [id number] at [redacted]w[redacted]d here for SOL.  #Labor: SOL #Pain: Epidural #FWB: Cat 1  #ID:  GBS positive #MOF: Both #MOC:Nexplanon #Circ:  Yes, outpatient  Alexa A Kimker, Student-PA  04/12/2019, 1:47 PM   I confirm that I have verified the information documented in the physician assistant student's note and that I have also personally reperformed the history, physical exam and all medical decision making activities of this service and have verified that all service and findings are accurately documented in this student's note.   -Patient coping well, has epidural in place.  -Patient has history of 50 second SD with 2nd baby; baby 3100 grams. Resolved with [place], [facility] and [place]. Baby had no sequalea    Marylene Land, [place] 04/12/2019 5:54 PM

## 2019-04-12 NOTE — Anesthesia Preprocedure Evaluation (Addendum)
Anesthesia Evaluation  Patient identified by MRN, date of birth, ID band Patient awake    Reviewed: Allergy & Precautions, Patient's Chart, lab work & pertinent test results  History of Anesthesia Complications Negative for: history of anesthetic complications  Airway Mallampati: II  TM Distance: >3 FB Neck ROM: Full    Dental no notable dental hx.    Pulmonary former smoker,    Pulmonary exam normal        Cardiovascular negative cardio ROS Normal cardiovascular exam     Neuro/Psych  Headaches, Depression negative psych ROS   GI/Hepatic Neg liver ROS, GERD  Medicated and Controlled,  Endo/Other  negative endocrine ROS  Renal/GU negative Renal ROS  negative genitourinary   Musculoskeletal negative musculoskeletal ROS (+)   Abdominal   Peds  Hematology negative hematology ROS (+)   Anesthesia Other Findings Day of surgery medications reviewed with patient.  Reproductive/Obstetrics (+) Pregnancy                             Anesthesia Physical Anesthesia Plan  ASA: II  Anesthesia Plan: Combined Spinal and Epidural   Post-op Pain Management:    Induction:   PONV Risk Score and Plan: Treatment may vary due to age or medical condition  Airway Management Planned: Natural Airway  Additional Equipment:   Intra-op Plan:   Post-operative Plan:   Informed Consent: I have reviewed the patients History and Physical, chart, labs and discussed the procedure including the risks, benefits and alternatives for the proposed anesthesia with the patient or authorized representative who has indicated his/her understanding and acceptance.       Plan Discussed with:   Anesthesia Plan Comments:        Anesthesia Quick Evaluation

## 2019-04-12 NOTE — Progress Notes (Signed)
LABOR PROGRESS NOTE  Stacey [name] is a 28 y.o. [id number] at [redacted]w[redacted]d  admitted for SOL  Subjective: Patient is doing well. Feeling pressure but says that it is not constant.   Objective: BP 126/61   Pulse (!) 119   Temp 99.1 F (37.3 C) (Oral)   Resp 16   Ht 5' (1.524 m)   Wt 82.5 kg   LMP 06/23/2018   SpO2 100%   BMI 35.51 kg/m  or  Vitals:   04/12/19 2100 04/12/19 2130 04/12/19 2200 04/12/19 2231  BP: 117/80 124/77 125/61 126/61  Pulse: (!) 111 (!) 109 (!) 108 (!) 119  Resp:      Temp:    99.1 F (37.3 C)  TempSrc:    Oral  SpO2:      Weight:      Height:        Dilation: Lip/rim Effacement (%): 90 Cervical Position: Middle Station: 0 Presentation: Vertex Exam by:: victor crasenzo FHT: baseline rate 135, moderate varibility, + acel, occasional early and variabledecel Toco: 6-8 min  Labs: Lab Results  Component Value Date   WBC 9.3 04/12/2019   HGB 11.0 (L) 04/12/2019   HCT 33.1 (L) 04/12/2019   MCV 79.8 (L) 04/12/2019   PLT 288 04/12/2019    Patient Active Problem List   Diagnosis Date Noted  . Indication for care in labor and delivery, antepartum 04/12/2019  . History of shoulder dystocia in prior pregnancy 04/12/2019  . Group beta Strep positive 03/27/2019  . Chronic migraine 03/02/2019  . Tobacco use during pregnancy 01/22/2019  . Abnormal chromosomal and genetic finding on antenatal screening mother 10/11/2018  . Alpha thalassemia silent carrier 10/11/2018  . Bacterial vaginosis 10/05/2018  . Supervision of other normal pregnancy, antepartum 09/03/2018  . Hx of preeclampsia, prior pregnancy, currently pregnant, third trimester 11/09/2016  . 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 Major depressive disorder, recurrent, severe without psychotic features ([place])   . Former smoker 07/04/2014    Assessment / Plan: 28 y.o. [id number] at [redacted]w[redacted]d here for SOL   Labor: progressing. Unchanged from last check and contractions are irregular so initiating low  dose pitocin.  Fetal Wellbeing:  Cat 2, reassuring due to variability and acels  Pain Control:  Epidural in place  Anticipated MOD:  Vaginal deliver   Gifford Shave, MD  PGY-1, [facility]  04/12/2019, 10:51 PM

## 2019-04-12 NOTE — Progress Notes (Addendum)
Labor Progress Note Stacey [name] is a 28 y.o. [id number] at [redacted]w[redacted]d presented for spontaneous onset of labor S:  Pt is doing well with epidural. Reports feeling increasing pressure  O:  BP 123/83   Pulse (!) 115   Temp 98.7 F (37.1 C) (Oral)   Resp 20   Ht 5' (1.524 m)   Wt 82.5 kg   LMP 06/23/2018   SpO2 100%   BMI 35.51 kg/m   Baseline: 140 Variability: moderate Accels: 15x15 Decels: None  Toco: 2-4  CVE: Dilation: 7 Effacement (%): 90 Station: -1 Presentation: Vertex Exam by:: Cecelia Byars, RN   A&P: 28 y.o. [id number] [redacted]w[redacted]d SOL #Labor: Progressing well.  #Pain: Epidural #FWB: category 1 #GBS positive. Ampicillin and pencillin   Alexa A Kimker, Student-PA 7:06 PM   I confirm that I have verified the information documented in the physician assistant student's note and that I have also personally reperformed the history, physical exam and all medical decision making activities of this service and have verified that all service and findings are accurately documented in this student's note.     [place], [place] 04/12/2019 7:38 PM

## 2019-04-12 NOTE — Anesthesia Procedure Notes (Signed)
Epidural Patient location during procedure: OB Start time: 04/12/2019 1:28 PM End time: 04/12/2019 1:31 PM  Staffing Anesthesiologist: Brennan [name], MD Performed: anesthesiologist   Preanesthetic Checklist Completed: patient identified, pre-op evaluation, timeout performed, IV checked, risks and benefits discussed and monitors and equipment checked  Epidural Patient position: sitting Prep: site prepped and draped and DuraPrep Patient monitoring: heart rate, continuous pulse ox and blood pressure Approach: midline Location: L3-L4 Injection technique: LOR air  Needle:  Needle type: Tuohy  Needle gauge: 17 G Needle length: 9 cm Needle insertion depth: 6 cm Catheter type: closed end flexible Catheter size: 19 Gauge Catheter at skin depth: 11 cm  Assessment Events: blood not aspirated, injection not painful, no injection resistance, negative IV test and no paresthesia  Additional Notes Patient identified. Risks, benefits, and alternatives discussed with patient including but not limited to bleeding, infection, nerve damage, paralysis, failed block, incomplete pain control, headache, blood pressure changes, nausea, vomiting, reactions to medication, itching, and postpartum back pain. Confirmed with bedside nurse the patient's most recent platelet count. Confirmed with patient that they are not currently taking any anticoagulation, have any bleeding history, or any family history of bleeding disorders. Patient expressed understanding and wished to proceed. All questions were answered. Sterile technique was used throughout the entire procedure. Please see nursing notes for vital signs.   Crisp LOR with Tuohy needle on first pass. [place] 25g 173mm spinal needle introduced through Tuohy with clear CSF return prior to injection of intrathecal medication. Spinal needle withdrawn and epidural catheter threaded easily. Negative aspiration of catheter for heme or CSF prior to starting  epidural infusion. Warning signs of high block given to the patient including shortness of breath, tingling/numbness in hands, complete motor block, or any concerning symptoms with instructions to call for help. Patient was given instructions on fall risk and not to get out of bed. All questions and concerns addressed with instructions to call with any issues or inadequate analgesia.  Patient comfortable with contractions prior to my leaving room. Reason for block:procedure for pain

## 2019-04-13 ENCOUNTER — Encounter (HOSPITAL_COMMUNITY): Payer: Self-pay

## 2019-04-13 DIAGNOSIS — Z3A39 39 weeks gestation of pregnancy: Secondary | ICD-10-CM

## 2019-04-13 DIAGNOSIS — O99824 Streptococcus B carrier state complicating childbirth: Secondary | ICD-10-CM

## 2019-04-13 LAB — RPR: RPR Ser Ql: NONREACTIVE

## 2019-04-13 MED ORDER — PRENATAL MULTIVITAMIN CH
1.0000 | ORAL_TABLET | Freq: Every day | ORAL | Status: DC
  Administered 2019-04-13 – 2019-04-15 (×3): 1 via ORAL
  Filled 2019-04-13 (×3): qty 1

## 2019-04-13 MED ORDER — IBUPROFEN 600 MG PO TABS
600.0000 mg | ORAL_TABLET | Freq: Four times a day (QID) | ORAL | Status: DC
  Administered 2019-04-13 – 2019-04-14 (×4): 600 mg via ORAL
  Filled 2019-04-13 (×4): qty 1

## 2019-04-13 MED ORDER — ONDANSETRON HCL 4 MG/2ML IJ SOLN: 4.0000 mg | INTRAMUSCULAR | Status: DC | PRN

## 2019-04-13 MED ORDER — SENNOSIDES-DOCUSATE SODIUM 8.6-50 MG PO TABS
2.0000 | ORAL_TABLET | ORAL | Status: DC
  Administered 2019-04-13 – 2019-04-15 (×2): 2 via ORAL
  Filled 2019-04-13 (×2): qty 2

## 2019-04-13 MED ORDER — OXYCODONE-ACETAMINOPHEN 5-325 MG PO TABS
1.0000 | ORAL_TABLET | ORAL | Status: DC | PRN
  Administered 2019-04-13 – 2019-04-15 (×7): 1 via ORAL
  Filled 2019-04-13 (×7): qty 1

## 2019-04-13 MED ORDER — DIPHENHYDRAMINE HCL 25 MG PO CAPS: 25.0000 mg | ORAL_CAPSULE | Freq: Four times a day (QID) | ORAL | Status: DC | PRN

## 2019-04-13 MED ORDER — COCONUT OIL OIL
1.0000 "application " | TOPICAL_OIL | Status: DC | PRN
  Administered 2019-04-15: 1 via TOPICAL

## 2019-04-13 MED ORDER — TETANUS-DIPHTH-ACELL PERTUSSIS 5-2.5-18.5 LF-MCG/0.5 IM SUSP: 0.5000 mL | Freq: Once | INTRAMUSCULAR | Status: DC

## 2019-04-13 MED ORDER — SIMETHICONE 80 MG PO CHEW: 80.0000 mg | CHEWABLE_TABLET | ORAL | Status: DC | PRN

## 2019-04-13 MED ORDER — ZOLPIDEM TARTRATE 5 MG PO TABS: 5.0000 mg | ORAL_TABLET | Freq: Every evening | ORAL | Status: DC | PRN

## 2019-04-13 MED ORDER — DIBUCAINE (PERIANAL) 1 % EX OINT
1.0000 "application " | TOPICAL_OINTMENT | CUTANEOUS | Status: DC | PRN
  Administered 2019-04-13: 1 via RECTAL
  Filled 2019-04-13: qty 28

## 2019-04-13 MED ORDER — ONDANSETRON HCL 4 MG PO TABS: 4.0000 mg | ORAL_TABLET | ORAL | Status: DC | PRN

## 2019-04-13 MED ORDER — BENZOCAINE-MENTHOL 20-0.5 % EX AERO
1.0000 "application " | INHALATION_SPRAY | CUTANEOUS | Status: DC | PRN
  Administered 2019-04-13: 1 via TOPICAL
  Filled 2019-04-13: qty 56

## 2019-04-13 MED ORDER — WITCH HAZEL-GLYCERIN EX PADS: 1.0000 "application " | MEDICATED_PAD | CUTANEOUS | Status: DC | PRN

## 2019-04-13 MED ORDER — ACETAMINOPHEN 325 MG PO TABS
650.0000 mg | ORAL_TABLET | ORAL | Status: DC | PRN
  Administered 2019-04-13 (×2): 650 mg via ORAL
  Filled 2019-04-13 (×2): qty 2

## 2019-04-13 NOTE — Plan of Care (Signed)
COMPLETED

## 2019-04-13 NOTE — Progress Notes (Signed)
LABOR PROGRESS NOTE  Stacey [name] is a 28 y.o. [id number] at [redacted]w[redacted]d  admitted for SOL  Subjective: Patient is resting comfortably at this time.  Objective: BP 112/77   Pulse (!) 124   Temp 99 F (37.2 C) (Oral)   Resp 16   Ht 5' (1.524 m)   Wt 82.5 kg   LMP 06/23/2018   SpO2 100%   BMI 35.51 kg/m  or  Vitals:   04/13/19 0230 04/13/19 0245 04/13/19 0300 04/13/19 0330  BP: 117/63  120/77 112/77  Pulse: 92  (!) 115 (!) 124  Resp:      Temp:  99 F (37.2 C)    TempSrc:  Oral    SpO2:      Weight:      Height:         Dilation: 9 Effacement (%): 90 Cervical Position: Middle Station: Plus 1, 0 Presentation: Vertex Exam by:: victor crasenzo FHT: baseline rate 140, moderate varibility, + acel, no decel Toco: 2-4 min  Labs: Lab Results  Component Value Date   WBC 9.3 04/12/2019   HGB 11.0 (L) 04/12/2019   HCT 33.1 (L) 04/12/2019   MCV 79.8 (L) 04/12/2019   PLT 288 04/12/2019    Patient Active Problem List   Diagnosis Date Noted  . Indication for care in labor and delivery, antepartum 04/12/2019  . History of shoulder dystocia in prior pregnancy 04/12/2019  . Group beta Strep positive 03/27/2019  . Chronic migraine 03/02/2019  . Tobacco use during pregnancy 01/22/2019  . Abnormal chromosomal and genetic finding on antenatal screening mother 10/11/2018  . Alpha thalassemia silent carrier 10/11/2018  . Bacterial vaginosis 10/05/2018  . Supervision of other normal pregnancy, antepartum 09/03/2018  . Hx of preeclampsia, prior pregnancy, currently pregnant, third trimester 11/09/2016  . 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 Major depressive disorder, recurrent, severe without psychotic features ([place])   . Former smoker 07/04/2014    Assessment / Plan: 28 y.o. [id number] at [redacted]w[redacted]d here for SOL  Labor: Progressing well on Pitocin.  IUPC in place.  Making cervical change from previous exams. Fetal Wellbeing: Category 1, reassuring Pain Control: Epidural in  place Anticipated MOD: Vaginal GBS: Positive  Gifford Shave, MD  PGY-1, [facility]  04/13/2019, 4:13 AM

## 2019-04-13 NOTE — Discharge Summary (Addendum)
Postpartum Discharge Summary    Patient Name: Stacey [name] DOB: Apr 07, 1991 MRN: [id number]  Date of admission: 04/12/2019 Delivering Provider: Wende Mott   Date of discharge: 04/15/2019  Admitting diagnosis: contractions Intrauterine pregnancy: [redacted]w[redacted]d    Secondary diagnosis:  Active Problems:   Indication for care in labor and delivery, antepartum   History of shoulder dystocia in prior pregnancy   Shoulder dystocia during labor and delivery, delivered  Additional problems:     Discharge diagnosis: Term Pregnancy Delivered                                                                                                Post partum procedures:n/a  Augmentation: Pitocin  Complications: None  Hospital course:  Onset of Labor With Vaginal Delivery     28y.o. yo [id number] admitted in Active Labor on 04/12/2019. Patient had an uncomplicated labor course as follows: Progressed to 8cm, augmented with pitocin and pushed 10 minutes with shoulder dystocia at delivery.  Membrane Rupture Time/Date: 1:20 PM ,04/12/2019   Intrapartum Procedures: Episiotomy: None [1]                                         Lacerations:  None [1]  Patient had a delivery of a Viable infant. 04/13/2019  Information for the patient's newborn:  LyAudery, [place]0[[id number]]Delivery Method: Vag-Spont     Pateint had an uncomplicated postpartum course.  She is ambulating, tolerating a regular diet, passing flatus, and urinating well. Patient is discharged home in stable condition on 04/15/19.  Delivery time: 5:25 AM    Magnesium Sulfate received: No BMZ received: No Rhophylac:Yes MMR:No Transfusion:No  Physical exam  Vitals:   04/14/19 0400 04/14/19 1509 04/14/19 2101 04/15/19 0554  BP: 105/73 108/69 112/65 115/70  Pulse: 70 84 81   Resp: _0 Temp: 98.1 F (36.7 C) 98.4 F (36.9 C) 98.4 F (36.9 C) 98.9 F (37.2 C)  TempSrc: Oral Oral Oral   SpO2: 100% 100% 100%    Weight:      Height:       General: alert Lochia: appropriate Uterine Fundus: firm Incision: N/A DVT Evaluation: No evidence of DVT seen on physical exam. Labs: Lab Results  Component Value Date   WBC 13.3 (H) 04/14/2019   HGB 8.8 (L) 04/14/2019   HCT 27.2 (L) 04/14/2019   MCV 80.0 04/14/2019   PLT 247 04/14/2019   CMP Latest Ref Rng & Units 01/15/2019  Glucose 70 - 99 mg/dL 84  BUN 6 - 20 mg/dL <5(L)  Creatinine 0.44 - 1.00 mg/dL 0.44  Sodium 135 - 145 mmol/L 135  Potassium 3.5 - 5.1 mmol/L 3.9  Chloride 98 - 111 mmol/L 105  CO2 22 - 32 mmol/L 22  Calcium 8.9 - 10.3 mg/dL 8.9  Total Protein 6.5 - 8.1 g/dL 6.3(L)  Total Bilirubin 0.3 - 1.2 mg/dL 0.2(L)  Alkaline Phos 38 - 126 U/L 69  AST 15 -  41 U/L 13(L)  ALT 0 - 44 U/L 10    Discharge instruction: per After Visit Summary and "Baby and Me Booklet".  After visit meds:  Allergies as of 04/15/2019   No Known Allergies     Medication List    STOP taking these medications   Comfort Fit Maternity Supp Lg Misc   omeprazole 20 MG capsule Commonly known as: PriLOSEC     TAKE these medications   ibuprofen 600 MG tablet Commonly known as: ADVIL Take 1 tablet (600 mg total) by mouth every 6 (six) hours.   valACYclovir 500 MG tablet Commonly known as: Valtrex Take 1 tablet (500 mg total) by mouth 2 (two) times daily.   Vitafol Gummies 3.33-0.333-34.8 MG Chew Chew 3 tablets by mouth daily before breakfast.       Diet: routine diet  Activity: Advance as tolerated. Pelvic rest for 6 weeks.   Outpatient follow up:4 weeks Follow up Appt: Future Appointments  Date Time Provider [place]  04/19/2019  8:00 AM [facility] 1 [facility] None  04/27/2019 10:15 AM Constant, Vickii Chafe, MD [facility] None  05/11/2019  2:00 PM Constant, Vickii Chafe, MD [facility] None  05/27/2019  2:15 PM Suzzanne Cloud, NP [facility] None   Follow up Visit:  Please schedule this patient for Postpartum visit in: 4 weeks with the following provider: Any  provider For C/S patients schedule nurse incision check in weeks 2 weeks: no Low risk pregnancy complicated by: n/a Delivery mode:  SVD Anticipated Birth Control:  Nexplanon PP Procedures needed: n/a  Schedule Integrated [place] visit: no   Newborn Data: Live born female  Birth Weight:  3980g APGAR :5, 9  Newborn Delivery   Birth date/time: 04/13/2019 05:25:00 Delivery type: Vaginal, Spontaneous      Baby Feeding: both Disposition:home with mother  Phill Myron, D.O. [facility] Family Medicine Fellow, [facility] for [facility], [address] 04/15/2019, 11:26 AM

## 2019-04-13 NOTE — Progress Notes (Signed)
Labor Progress Note Stacey [name] is a 28 y.o. [id number] at [redacted]w[redacted]d presented for SOL  S:  Patient comfortable  O:  BP 111/63   Pulse (!) 108   Temp 99.1 F (37.3 C) (Oral)   Resp 16   Ht 5' (1.524 m)   Wt 82.5 kg   LMP 06/23/2018   SpO2 100%   BMI 35.51 kg/m   Fetal Tracing:  Baseline: 150 Variability: moderate Accels: 15x15 Decels: none  Toco: 1-4   CVE: Dilation: 8 Effacement (%): 80 Cervical Position: Middle Station: 0 Presentation: Vertex Exam by:: caroline   A&P: 28 y.o. [id number] [redacted]w[redacted]d SOL #Labor: Minimal cervical change. Discussed risks and benefits of IUPC placement for monitoring contractions more accurately. Patient agreeable to plan of care. Internal monitors placed without difficulty and patient tolerated procedure well. Will titrate pitocin by [facility] #Pain: epidural #FWB: Cat 1 #GBS positive  Wende Mott, CNM 12:39 AM

## 2019-04-13 NOTE — Progress Notes (Signed)
MOB was referred for history of depression.  * Referral screened out by Clinical Social Worker because none of the following criteria appear to apply:  ~ History of anxiety/depression during this pregnancy, or of post-partum depression following prior delivery. ~ Diagnosis of anxiety and/or depression within last 3 years. Per chart review, it appears MOB was diagnosed back in 2016 following a [facility] admission. MOB had CSW visit in 2018 where MOB addressed this and reported she was in a bad situation but situation had improved. No concerns noted in [facility] records.  OR * MOB's symptoms currently being treated with medication and/or therapy.  Please contact the Clinical Social Worker if needs arise or by MOB request.  Elijio Miles, [place]  Women's and [employer] [phone]

## 2019-04-13 NOTE — Anesthesia Postprocedure Evaluation (Signed)
Anesthesia Post Note  Patient: Stacey [name]  Procedure(s) Performed: AN AD [place]     Patient location during evaluation: Mother Baby Anesthesia Type: Epidural Level of consciousness: awake and alert Pain management: pain level controlled Vital Signs Assessment: post-procedure vital signs reviewed and stable Respiratory status: spontaneous breathing, nonlabored ventilation and respiratory function stable Cardiovascular status: stable Postop Assessment: no headache, no backache, epidural receding, no apparent nausea or vomiting, patient able to bend at knees, adequate PO intake and able to ambulate Anesthetic complications: no    Last Vitals:  Vitals:   04/13/19 0830 04/13/19 1205  BP: 120/76 110/74  Pulse: 84 94  Resp:  20  Temp: 37.3 C 37.1 C  SpO2: 100% 100%    Last Pain:  Vitals:   04/13/19 1408  TempSrc:   PainSc: 9    Pain Goal:                   [employer]

## 2019-04-13 NOTE — Lactation Note (Signed)
This note was copied from a baby's chart. Lactation Consultation Note  Patient Name: Stacey [name] [id number] Date: 04/13/2019 Reason for consult: Initial assessment;Term  Baby is 6 hours , P 3, per mom breast fed 1st baby 2 months and 2nd baby  8 months.  Mom feeding preference breast / formula on admission and at present time.  LC reviewed supply/ demand and the importance of giving the baby the opportunity to breast feed 1st prior to supplementing with a bottle.  Latch score has been 10.  LC and orientee offered to review hand expressing and mom receptive.  Several drops noted.  LC recommended for mom to call when baby is showing feeding cues  For Lactation for feeding Latch assessment.  LC reviewed the resources and phone numbers for [facility] services after D/C .  Per mom active with WIC/ [place] and has a DEBP at home , just sure of the type.    Maternal Data Has patient been taught Hand Expression?: Yes Does the patient have breastfeeding experience prior to this delivery?: Yes  Feeding Feeding Type: (baby last fed 1010)  LATCH Score                   Interventions Interventions: Breast feeding basics reviewed;Hand express  Lactation Tools Discussed/Used WIC Program: Yes   Consult Status Consult Status: Follow-up Date: 04/13/19 Follow-up type: In-patient    [place] 04/13/2019, 11:50 AM

## 2019-04-14 ENCOUNTER — Ambulatory Visit (HOSPITAL_COMMUNITY): Payer: Medicaid Other

## 2019-04-14 LAB — CBC
HCT: 27.2 % — ABNORMAL LOW (ref 36.0–46.0)
Hemoglobin: 8.8 g/dL — ABNORMAL LOW (ref 12.0–15.0)
MCH: 25.9 pg — ABNORMAL LOW (ref 26.0–34.0)
MCHC: 32.4 g/dL (ref 30.0–36.0)
MCV: 80 fL (ref 80.0–100.0)
Platelets: 247 10*3/uL (ref 150–400)
RBC: 3.4 MIL/uL — ABNORMAL LOW (ref 3.87–5.11)
RDW: 14.9 % (ref 11.5–15.5)
WBC: 13.3 10*3/uL — ABNORMAL HIGH (ref 4.0–10.5)
nRBC: 0 % (ref 0.0–0.2)

## 2019-04-14 MED ORDER — LIDOCAINE HCL 1 % IJ SOLN: 0.0000 mL | Freq: Once | INTRAMUSCULAR | Status: DC | PRN

## 2019-04-14 MED ORDER — KETOROLAC TROMETHAMINE 60 MG/2ML IM SOLN
60.0000 mg | Freq: Once | INTRAMUSCULAR | Status: AC
  Administered 2019-04-14: 60 mg via INTRAMUSCULAR
  Filled 2019-04-14: qty 2

## 2019-04-14 MED ORDER — IBUPROFEN 600 MG PO TABS
600.0000 mg | ORAL_TABLET | Freq: Four times a day (QID) | ORAL | Status: DC
  Administered 2019-04-14 – 2019-04-15 (×4): 600 mg via ORAL
  Filled 2019-04-14 (×4): qty 1

## 2019-04-14 MED ORDER — ETONOGESTREL 68 MG ~~LOC~~ IMPL: 68.0000 mg | DRUG_IMPLANT | Freq: Once | SUBCUTANEOUS | Status: DC

## 2019-04-14 NOTE — Lactation Note (Signed)
This note was copied from a baby's chart. Lactation Consultation Note  Patient Name: Stacey [name] [id number] Date: 04/14/2019 Reason for consult: Follow-up assessment;Term  P3 mother whose infant is now 85 hours old.  Mother breast fed her first child for 2 months and her second child for 8 months.  Baby was swaddled and latched to the left breast in the cradle hold when I arrived.  The latch was shallow.  Offered to assist and mother accepted.  Asked permission to remove swaddle and clothing.  Explained the importance of feeding STS.  Also suggested using the cross cradle hold instead of the cradle hold and mother willing to try.  Her breasts are soft and non tender and nipples are everted and intact.    Assisted to latch baby to the left breast without difficulty.  He began sucking but was obviously sleepy.  Demonstrated breast compressions and gentle stimulation to arouse him.  Mother had been feeding for approximately 20 minutes prior to my arrival.  He may have been tired due to already feeding.  Asked her to continue to stimulate him to suck.  He responded well to gentle touch.  She will call for latch assistance as needed.  Mother will continue to feed 8-12 times/24 hours or sooner if baby shows feeding cues.  Suggested she continue hand expression before/after feedings to help increase milk supply.  She has been able to express colostrum.  Mother has a DEBP for home use.  Support person present and sleeping.   Maternal Data Formula Feeding for Exclusion: Yes Reason for exclusion: Mother's choice to formula and breast feed on admission Has patient been taught Hand Expression?: Yes Does the patient have breastfeeding experience prior to this delivery?: Yes  Feeding Feeding Type: Breast Fed  LATCH Score Latch: Repeated attempts needed to sustain latch, nipple held in mouth throughout feeding, stimulation needed to elicit sucking reflex.  Audible Swallowing: A few with  stimulation  Type of Nipple: Everted at rest and after stimulation  Comfort (Breast/Nipple): Soft / non-tender  Hold (Positioning): Assistance needed to correctly position infant at breast and maintain latch.  LATCH Score: 7  Interventions Interventions: Breast feeding basics reviewed;Assisted with latch;Skin to skin;Breast massage;Hand express;Breast compression;Adjust position;Position options;Support pillows  Lactation Tools Discussed/Used     Consult Status Consult Status: Follow-up Date: 04/15/19 Follow-up type: In-patient    [place] 04/14/2019, 12:41 PM

## 2019-04-14 NOTE — Lactation Note (Signed)
This note was copied from a baby's chart. Lactation Consultation Note  Patient Name: Stacey [name] [id number] Date: 04/14/2019 Reason for consult: Follow-up assessment  LC Follow Up Visit:  Attempted to visit with mother, however, she was asleep.  I will attempt to return later today.   Maternal Data    Feeding Feeding Type: Bottle Fed - Formula Nipple Type: Slow - flow  LATCH Score                   Interventions    Lactation Tools Discussed/Used     Consult Status Consult Status: Follow-up Date: 04/14/19 Follow-up type: In-patient    Magic Mohler R Mathis Cashman 04/14/2019, 9:32 AM

## 2019-04-14 NOTE — Progress Notes (Signed)
POSTPARTUM PROGRESS NOTE  Post Partum Day 1  Subjective:  Stacey [name] is a 28 y.o. [id number] s/p SVD at [redacted]w[redacted]d.  No acute events overnight.  Pt denies problems with voiding or po intake.She reports extreme pain with ambulating d/t suprapubic pelvic pain, rates pain 10/10. She denies nausea or vomiting.  Pain is poorly controlled.  She has had flatus. She has not had bowel movement.  Lochia Small.   Objective: Blood pressure 105/73, pulse 70, temperature 98.1 F (36.7 C), temperature source Oral, resp. rate 16, height 5' (1.524 m), weight 82.5 kg, last menstrual period 06/23/2018, SpO2 100 %, unknown if currently breastfeeding.  Physical Exam:  General: alert, cooperative and no distress Chest: no respiratory distress Heart:regular rate, distal pulses intact Abdomen: soft, nontender,  Uterine Fundus: firm, appropriately tender DVT Evaluation: No calf swelling or tenderness Extremities: no edema  Recent Labs    04/12/19 1245 04/14/19 0603  HGB 11.0* 8.8*  HCT 33.1* 27.2*    Assessment/Plan: Stacey [name] is a 28 y.o. A5W0981 s/p SVD at [redacted]w[redacted]d   PPD#1 - Doing well, reports increased suprapubic pain- currently taking percocet for pain without relief of suprapubic pain, ordered 1 dose of Toradol Contraception: Considering Nexplanon- not inpatient  Feeding: Formula  Dispo: Plan for discharge tomorrow.   LOS: 2 days   Lajean Manes, CNM 04/14/2019, 7:55 AM

## 2019-04-15 ENCOUNTER — Encounter: Payer: Medicaid Other | Admitting: Obstetrics

## 2019-04-15 MED ORDER — IBUPROFEN 600 MG PO TABS: 600.0000 mg | ORAL_TABLET | Freq: Four times a day (QID) | ORAL | 0 refills | Status: DC

## 2019-04-15 MED ORDER — HYDROCORT-PRAMOXINE (PERIANAL) 1-1 % EX FOAM
1.0000 | Freq: Two times a day (BID) | CUTANEOUS | Status: DC
  Administered 2019-04-15: 1 via RECTAL
  Filled 2019-04-15: qty 10

## 2019-04-15 NOTE — Lactation Note (Signed)
This note was copied from a baby's chart. Lactation Consultation Note  Patient Name: Stacey [name] Telicia Hodgkiss QPRFF'M Date: 04/15/2019 Reason for consult: Follow-up assessment;Term  LC in to visit with P3 Mom of term baby on day of discharge.  Baby at 6.7% weight loss with good output.   Mom resting in bed, and baby sleeping.  Mom denies any questions and states she is latching baby well.  Mom reminded of OP lactation support after discharge and encouraged her to call prn.  Feeding Feeding Type: Breast Fed  LATCH Score Latch: Grasps breast easily, tongue down, lips flanged, rhythmical sucking.  Audible Swallowing: A few with stimulation  Type of Nipple: Everted at rest and after stimulation  Comfort (Breast/Nipple): Soft / non-tender  Hold (Positioning): No assistance needed to correctly position infant at breast.  LATCH Score: 9  Interventions Interventions: Breast feeding basics reviewed;Skin to skin;Breast massage;Hand express  Lactation Tools Discussed/Used Tools: Pump Breast pump type: Double-Electric Breast Pump   Consult Status Consult Status: Complete Date: 04/15/19 Follow-up type: Call as needed    Broadus John 04/15/2019, 11:13 AM

## 2019-04-15 NOTE — Discharge Instructions (Signed)

## 2019-04-19 ENCOUNTER — Other Ambulatory Visit (HOSPITAL_COMMUNITY)
Admission: RE | Admit: 2019-04-19 | Discharge: 2019-04-19 | Disposition: A | Discharge status: Home / Self Care | Payer: Medicaid Other | Source: Ambulatory Visit | Attending: Obstetrics & Gynecology | Admitting: Obstetrics & Gynecology

## 2019-04-21 ENCOUNTER — Inpatient Hospital Stay (HOSPITAL_COMMUNITY): Payer: Medicaid Other

## 2019-04-21 ENCOUNTER — Inpatient Hospital Stay (HOSPITAL_COMMUNITY)
Admission: RE | Admit: 2019-04-21 | Payer: Medicaid Other | Source: Home / Self Care | Admitting: Obstetrics & Gynecology

## 2019-04-27 ENCOUNTER — Ambulatory Visit: Payer: Medicaid Other | Admitting: Obstetrics and Gynecology

## 2019-05-04 ENCOUNTER — Other Ambulatory Visit: Payer: Self-pay

## 2019-05-04 ENCOUNTER — Ambulatory Visit: Payer: Medicaid Other | Admitting: *Deleted

## 2019-05-04 VITALS — BP 125/86 | HR 78

## 2019-05-04 DIAGNOSIS — R399 Unspecified symptoms and signs involving the genitourinary system: Secondary | ICD-10-CM

## 2019-05-04 LAB — POCT URINALYSIS DIPSTICK
Bilirubin, UA: NEGATIVE
Glucose, UA: NEGATIVE
Ketones, UA: NEGATIVE
Nitrite, UA: NEGATIVE
Protein, UA: NEGATIVE
Spec Grav, UA: 1.02 (ref 1.010–1.025)
Urobilinogen, UA: 0.2 E.U./dL
pH, UA: 6 (ref 5.0–8.0)

## 2019-05-04 NOTE — Progress Notes (Signed)
Pt is in office for possible uti after vaginal delivery.  Pt states she is having some lower back/flank pain.  Pt states some discomfort with urination.  Pt made aware that UC could be sent today, will await results for tx.  Pt has PP visit scheduled for next week, advised to keep.   Pt has no other concerns today.  BP 125/86   Pulse 78   LMP 06/23/2018   Urine dipstick shows negative for all components, positive for leukocytes, red blood cells.

## 2019-05-05 NOTE — Progress Notes (Signed)
Patient seen and assessed by nursing staff during this encounter. I have reviewed the chart and agree with the documentation and plan.  Mora Bellman, MD 05/05/2019 8:11 AM

## 2019-05-06 LAB — URINE CULTURE: Organism ID, Bacteria: NO GROWTH

## 2019-05-11 ENCOUNTER — Other Ambulatory Visit: Payer: Self-pay

## 2019-05-11 ENCOUNTER — Encounter: Payer: Self-pay | Admitting: Obstetrics and Gynecology

## 2019-05-11 ENCOUNTER — Ambulatory Visit (INDEPENDENT_AMBULATORY_CARE_PROVIDER_SITE_OTHER): Payer: Medicaid Other | Admitting: Obstetrics and Gynecology

## 2019-05-11 DIAGNOSIS — Z3202 Encounter for pregnancy test, result negative: Secondary | ICD-10-CM

## 2019-05-11 DIAGNOSIS — Z1389 Encounter for screening for other disorder: Secondary | ICD-10-CM

## 2019-05-11 DIAGNOSIS — Z30017 Encounter for initial prescription of implantable subdermal contraceptive: Secondary | ICD-10-CM

## 2019-05-11 LAB — POCT URINE PREGNANCY: Preg Test, Ur: NEGATIVE

## 2019-05-11 MED ORDER — ETONOGESTREL 68 MG ~~LOC~~ IMPL
68.0000 mg | DRUG_IMPLANT | Freq: Once | SUBCUTANEOUS | Status: AC
  Administered 2019-05-11: 68 mg via SUBCUTANEOUS

## 2019-05-11 MED ORDER — VITAFOL ULTRA 29-0.6-0.4-200 MG PO CAPS: 1.0000 | ORAL_CAPSULE | Freq: Every day | ORAL | 12 refills | Status: DC

## 2019-05-11 NOTE — Progress Notes (Signed)
UPT today is Negative  Subjective:     Stacey [name] is a 28 y.o. female who presents for a postpartum visit. She is 4 weeks postpartum following a spontaneous vaginal delivery. I have fully reviewed the prenatal and intrapartum course. The delivery was at 36.6 gestational weeks. Outcome: spontaneous vaginal delivery. Anesthesia: epidural. Postpartum course has been Unremarkable. Baby's course has been Unremarkable. Baby is feeding by breast. Bleeding thin lochia. Bowel function is normal. Bladder function is hurts to pee. Patient is not sexually active. Contraception method is none. Postpartum depression screening: negative.   Review of Systems Pertinent items are noted in HPI.   Objective:    BP 125/81   Pulse 69   Ht 5' (1.524 m)   Wt 157 lb (71.2 kg)   LMP 06/23/2018   Breastfeeding Yes   BMI 30.66 kg/m   General:  alert, cooperative and no distress   Breasts:  inspection negative, no nipple discharge or bleeding, no masses or nodularity palpable  Lungs: clear to auscultation bilaterally  Heart:  regular rate and rhythm  Abdomen: soft, non-tender; bowel sounds normal; no masses,  no organomegaly   Vulva:  normal  Vagina: normal vagina, no discharge, exudate, lesion, or erythema  Cervix:  multiparous appearance  Corpus: normal size, contour, position, consistency, mobility, non-tender  Adnexa:  normal adnexa and no mass, fullness, tenderness  Rectal Exam: Not performed.        Assessment:     Normal postpartum exam. Pap smear not done at today's visit.   Plan:    1. Contraception: Nexplanon  Patient given informed consent, signed copy in the chart, time out was performed. Pregnancy test was negative. Appropriate time out taken.  Patient's left arm was prepped and draped in the usual sterile fashion.. The ruler used to measure and mark insertion area.  Patient was prepped with alcohol swab and then injected with 2 cc of 1% lidocaine with epinephrine.  Patient was prepped  with betadine, Nexplanon removed form packaging.  Device confirmed in needle, then inserted full length of needle and withdrawn per handbook instructions.  Patient insertion site covered with a bandaid and coband.   Minimal blood loss.  Patient tolerated the procedure well.   2. Patient is medically cleared to resume all activities of daily living. Urine culture was sent  3. Follow up in: 6 months or as needed.

## 2019-05-13 LAB — URINE CULTURE

## 2019-05-27 ENCOUNTER — Telehealth: Payer: Self-pay

## 2019-05-27 ENCOUNTER — Ambulatory Visit: Payer: Medicaid Other | Admitting: Neurology

## 2019-05-27 ENCOUNTER — Encounter: Payer: Self-pay | Admitting: Neurology

## 2019-05-27 NOTE — Telephone Encounter (Signed)
Patient was a no call/no show for their appointment today.   

## 2019-05-27 NOTE — Progress Notes (Deleted)
PATIENT: Stacey [name] DOB: 07-01-1990  REASON FOR VISIT: follow up HISTORY FROM: patient  HISTORY OF PRESENT ILLNESS: Today 05/27/19  HISTORY  Stacey [name] is a 28 year old female, seen in request by her PCP Dr. Earlie Lou and OB/GYN Dr. Clearance Coots, Leonette Most for evaluation of migraine headache, initial evaluation was on March 02, 2019.  I have reviewed and summarized the referring note from the referring physician.  She is currently [redacted] weeks pregnant, migraine headaches since pregnancy, previously she was having headache about 2 twice each week, minute almost daily basis 2020, she has to take every 4 hours for 6 months, recently was given prescription of Fioricet which is helpful, quite her typical migraine as retro-orbital area moderate to severe headache with associated light, noise sensitivity, nauseous, lasting 4 to 6 hours, sleep was helpful.  She never tried preventive medication or triptan treatment in the past, she is a mother of 43 and 80 years old, expected due date for her boy is April 14, 2019.  Laboratory evaluation August 2020 showed negative HIV, RPR, CBC showed hemoglobin of 9.9, WBC of 12, CMP showed creatinine of 0.4,  Update May 27, 2019 SS:   REVIEW OF SYSTEMS: Out of a complete 14 system review of symptoms, the patient complains only of the following symptoms, and all other reviewed systems are negative.  ALLERGIES: No Known Allergies  HOME MEDICATIONS: Outpatient Medications Prior to Visit  Medication Sig Dispense Refill  . ibuprofen (ADVIL) 600 MG tablet Take 1 tablet (600 mg total) by mouth every 6 (six) hours. 30 tablet 0  . Prenat-Fe Poly-Methfol-FA-DHA (VITAFOL ULTRA) 29-0.6-0.4-200 MG CAPS Take 1 tablet by mouth daily. 30 capsule 12  . Prenatal Vit-Fe Phos-FA-Omega (VITAFOL GUMMIES) 3.33-0.333-34.8 MG CHEW Chew 3 tablets by mouth daily before breakfast. (Patient not taking: Reported on 05/11/2019) 90 tablet 11  . valACYclovir (VALTREX) 500  MG tablet Take 1 tablet (500 mg total) by mouth 2 (two) times daily. (Patient not taking: Reported on 05/11/2019) 60 tablet 1   No facility-administered medications prior to visit.     PAST MEDICAL HISTORY: Past Medical History:  Diagnosis Date  . Alpha thalassemia silent carrier 10/11/2018   [x]  Genetic counseling [ ] FOB testing  . GERD (gastroesophageal reflux disease)   . 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 Kidney infection    kidney infection 2012  . Migraine     PAST SURGICAL HISTORY: Past Surgical History:  Procedure Laterality Date  . NO PAST SURGERIES      FAMILY HISTORY: Family History  Problem Relation Age of Onset  . Hypertension Mother   . Migraines Mother   . Asthma Brother   . Arthritis Maternal Grandmother   . Cancer Maternal Grandmother   . Arthritis Paternal Grandmother   . Healthy Father   . Anesthesia problems Neg Hx     SOCIAL HISTORY: Social History   Socioeconomic History  . Marital status: Single    Spouse name: Not on file  . Number of children: 2  . Years of education: 57  . Highest education level: High school graduate  Occupational History  . Occupation: 2013  Social Needs  . Financial resource strain: Not on file  . Food insecurity    Worry: Not on file    Inability: Not on file  . Transportation needs    Medical: Not on file    Non-medical: Not on file  Tobacco Use  . Smoking status: Former Smoker  Types: Cigars    Quit date: 01/25/2019    Years since quitting: 0.3  . Smokeless tobacco: Never Used  . Tobacco comment: black and mild-half of one every other day  Substance and Sexual Activity  . Alcohol use: No    Frequency: Never  . Drug use: No  . Sexual activity: Yes    Birth control/protection: None    Comment: last IC-2 days ago   Lifestyle  . Physical activity    Days per week: Not on file    Minutes per session: Not on file  . Stress: Not on file  Relationships  . Social Herbalist  on phone: Not on file    Gets together: Not on file    Attends religious service: Not on file    Active member of club or organization: Not on file    Attends meetings of clubs or organizations: Not on file    Relationship status: Not on file  . Intimate partner violence    Fear of current or ex partner: Not on file    Emotionally abused: Not on file    Physically abused: Not on file    Forced sexual activity: Not on file  Other Topics Concern  . Not on file  Social History Narrative   Lives at home with her children.   Right-handed.   Caffeine use: 2 cups per day.      PHYSICAL EXAM  There were no vitals filed for this visit. There is no height or weight on file to calculate BMI.  Generalized: Well developed, in no acute distress   Neurological examination  Mentation: Alert oriented to time, place, history taking. Follows all commands speech and language fluent Cranial nerve II-XII: Pupils were equal round reactive to light. Extraocular movements were full, visual field were full on confrontational test. Facial sensation and strength were normal. Uvula tongue midline. Head turning and shoulder shrug  were normal and symmetric. Motor: The motor testing reveals 5 over 5 strength of all 4 extremities. Good symmetric motor tone is noted throughout.  Sensory: Sensory testing is intact to soft touch on all 4 extremities. No evidence of extinction is noted.  Coordination: Cerebellar testing reveals good finger-nose-finger and heel-to-shin bilaterally.  Gait and station: Gait is normal. Tandem gait is normal. Romberg is negative. No drift is seen.  Reflexes: Deep tendon reflexes are symmetric and normal bilaterally.   DIAGNOSTIC DATA (LABS, IMAGING, TESTING) - I reviewed patient records, labs, notes, testing and imaging myself where available.  Lab Results  Component Value Date   WBC 13.3 (H) 04/14/2019   HGB 8.8 (L) 04/14/2019   HCT 27.2 (L) 04/14/2019   MCV 80.0 04/14/2019    PLT 247 04/14/2019      Component Value Date/Time   NA 135 01/15/2019 2157   NA 135 10/26/2016 1137   K 3.9 01/15/2019 2157   CL 105 01/15/2019 2157   CO2 22 01/15/2019 2157   GLUCOSE 84 01/15/2019 2157   BUN <5 (L) 01/15/2019 2157   BUN 6 10/26/2016 1137   CREATININE 0.44 01/15/2019 2157   CALCIUM 8.9 01/15/2019 2157   PROT 6.3 (L) 01/15/2019 2157   PROT 6.3 10/26/2016 1137   ALBUMIN 3.2 (L) 01/15/2019 2157   ALBUMIN 4.1 10/26/2016 1137   AST 13 (L) 01/15/2019 2157   ALT 10 01/15/2019 2157   ALKPHOS 69 01/15/2019 2157   BILITOT 0.2 (L) 01/15/2019 2157   BILITOT <0.2 10/26/2016 1137   GFRNONAA >  60 01/15/2019 2157   GFRAA >60 01/15/2019 2157   No results found for: CHOL, HDL, LDLCALC, LDLDIRECT, TRIG, CHOLHDL Lab Results  Component Value Date   HGBA1C 5.0 10/26/2016   No results found for: VITAMINB12 No results found for: TSH    ASSESSMENT AND PLAN 28 y.o. year old female  has a past medical history of Alpha thalassemia silent carrier (10/11/2018), GERD (gastroesophageal reflux disease), HSV-2 infection, Kidney infection, and Migraine. here with ***   I spent 15 minutes with the patient. 50% of this time was spent   Margie EgeSarah Sofi Bryars, DeWittAGNP-C, DNP 05/27/2019, 5:58 AM [facility] [address], [address] [place], [place] [address] [phone]

## 2019-06-02 ENCOUNTER — Ambulatory Visit: Payer: Medicaid Other

## 2019-06-17 ENCOUNTER — Ambulatory Visit: Payer: Medicaid Other | Attending: Internal Medicine

## 2019-06-17 DIAGNOSIS — Z20828 Contact with and (suspected) exposure to other viral communicable diseases: Secondary | ICD-10-CM | POA: Diagnosis not present

## 2019-06-17 DIAGNOSIS — Z20822 Contact with and (suspected) exposure to covid-19: Secondary | ICD-10-CM

## 2019-06-18 LAB — NOVEL CORONAVIRUS, NAA: SARS-CoV-2, NAA: NOT DETECTED

## 2019-10-15 ENCOUNTER — Ambulatory Visit: Payer: Medicaid Other | Admitting: Obstetrics

## 2019-10-21 ENCOUNTER — Encounter: Payer: Self-pay | Admitting: Obstetrics

## 2019-10-21 ENCOUNTER — Other Ambulatory Visit: Payer: Self-pay

## 2019-10-21 ENCOUNTER — Other Ambulatory Visit (HOSPITAL_COMMUNITY)
Admission: RE | Admit: 2019-10-21 | Discharge: 2019-10-21 | Disposition: A | Discharge status: Home / Self Care | Payer: Medicaid Other | Source: Ambulatory Visit | Attending: Obstetrics | Admitting: Obstetrics

## 2019-10-21 ENCOUNTER — Ambulatory Visit: Payer: Medicaid Other | Admitting: Obstetrics

## 2019-10-21 VITALS — BP 127/84 | HR 88 | Wt 159.0 lb

## 2019-10-21 DIAGNOSIS — N760 Acute vaginitis: Secondary | ICD-10-CM | POA: Diagnosis not present

## 2019-10-21 DIAGNOSIS — N898 Other specified noninflammatory disorders of vagina: Secondary | ICD-10-CM | POA: Diagnosis not present

## 2019-10-21 DIAGNOSIS — B373 Candidiasis of vulva and vagina: Secondary | ICD-10-CM

## 2019-10-21 DIAGNOSIS — N3001 Acute cystitis with hematuria: Secondary | ICD-10-CM

## 2019-10-21 DIAGNOSIS — N939 Abnormal uterine and vaginal bleeding, unspecified: Secondary | ICD-10-CM | POA: Diagnosis not present

## 2019-10-21 DIAGNOSIS — B9689 Other specified bacterial agents as the cause of diseases classified elsewhere: Secondary | ICD-10-CM

## 2019-10-21 DIAGNOSIS — Z3202 Encounter for pregnancy test, result negative: Secondary | ICD-10-CM

## 2019-10-21 DIAGNOSIS — B3731 Acute candidiasis of vulva and vagina: Secondary | ICD-10-CM

## 2019-10-21 LAB — POCT URINALYSIS DIPSTICK
Bilirubin, UA: NEGATIVE
Glucose, UA: NEGATIVE
Ketones, UA: NEGATIVE
Leukocytes, UA: NEGATIVE
Nitrite, UA: NEGATIVE
Protein, UA: NEGATIVE
Spec Grav, UA: 1.02 (ref 1.010–1.025)
Urobilinogen, UA: 0.2 E.U./dL
pH, UA: 6.5 (ref 5.0–8.0)

## 2019-10-21 LAB — POCT URINE PREGNANCY: Preg Test, Ur: NEGATIVE

## 2019-10-21 MED ORDER — FLUCONAZOLE 150 MG PO TABS: 150.0000 mg | ORAL_TABLET | Freq: Once | ORAL | 2 refills | Status: AC

## 2019-10-21 MED ORDER — METRONIDAZOLE 0.75 % VA GEL: 1.0000 | Freq: Two times a day (BID) | VAGINAL | 5 refills | Status: DC

## 2019-10-21 MED ORDER — CEFUROXIME AXETIL 500 MG PO TABS: 500.0000 mg | ORAL_TABLET | Freq: Two times a day (BID) | ORAL | 2 refills | Status: DC

## 2019-10-21 NOTE — Addendum Note (Signed)
Addended by: Marya Landry D on: 10/21/2019 11:22 AM   Modules accepted: Orders

## 2019-10-21 NOTE — Progress Notes (Signed)
Patient ID: Stacey [name], female   DOB: 04/08/1991, 29 y.o.   MRN: [id number]  Chief Complaint  Patient presents with  . Gynecologic Exam    HPI Stacey [name] is a 29 y.o. female.  Complains of back pain and malodorous vaginal discharge.  Denies dysuria. HPI  Past Medical History:  Diagnosis Date  . Alpha thalassemia silent carrier 10/11/2018   [x]  Genetic counseling [ ] FOB testing  . GERD (gastroesophageal reflux disease)   . 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 Kidney infection    kidney infection 2012  . Migraine     Past Surgical History:  Procedure Laterality Date  . NO PAST SURGERIES      Family History  Problem Relation Age of Onset  . Hypertension Mother   . Migraines Mother   . Asthma Brother   . Arthritis Maternal Grandmother   . Cancer Maternal Grandmother   . Arthritis Paternal Grandmother   . Healthy Father   . Anesthesia problems Neg Hx     Social History Social History   Tobacco Use  . Smoking status: Former Smoker    Types: Cigars    Quit date: 01/25/2019    Years since quitting: 0.7  . Smokeless tobacco: Never Used  . Tobacco comment: black and mild-half of one every other day  Substance Use Topics  . Alcohol use: No  . Drug use: No    No Known Allergies  Current Outpatient Medications  Medication Sig Dispense Refill  . cefUROXime (CEFTIN) 500 MG tablet Take 1 tablet (500 mg total) by mouth 2 (two) times daily with a meal. 14 tablet 2  . fluconazole (DIFLUCAN) 150 MG tablet Take 1 tablet (150 mg total) by mouth once for 1 dose. 1 tablet 2  . ibuprofen (ADVIL) 600 MG tablet Take 1 tablet (600 mg total) by mouth every 6 (six) hours. (Patient not taking: Reported on 10/21/2019) 30 tablet 0  . metroNIDAZOLE (METROGEL VAGINAL) 0.75 % vaginal gel Place 1 Applicatorful vaginally 2 (two) times daily. 70 g 5  . Prenat-Fe Poly-Methfol-FA-DHA (VITAFOL ULTRA) 29-0.6-0.4-200 MG CAPS Take 1 tablet by mouth daily. (Patient not taking: Reported on  10/21/2019) 30 capsule 12  . Prenatal Vit-Fe Phos-FA-Omega (VITAFOL GUMMIES) 3.33-0.333-34.8 MG CHEW Chew 3 tablets by mouth daily before breakfast. (Patient not taking: Reported on 05/11/2019) 90 tablet 11  . valACYclovir (VALTREX) 500 MG tablet Take 1 tablet (500 mg total) by mouth 2 (two) times daily. (Patient not taking: Reported on 05/11/2019) 60 tablet 1   No current facility-administered medications for this visit.    Review of Systems Review of Systems Constitutional: negative for fatigue and weight loss Respiratory: negative for cough and wheezing Cardiovascular: negative for chest pain, fatigue and palpitations Gastrointestinal: negative for abdominal pain and change in bowel habits Genitourinary:negative Integument/breast: negative for nipple discharge Musculoskeletal:negative for myalgias Neurological: negative for gait problems and tremors Behavioral/Psych: negative for abusive relationship, depression Endocrine: negative for temperature intolerance      Blood pressure 127/84, pulse 88, weight 159 lb (72.1 kg), currently breastfeeding.  Physical Exam Physical Exam General:   alert  Skin:   no rash or abnormalities  Lungs:   clear to auscultation bilaterally  Heart:   regular rate and rhythm, S1, S2 normal, no murmur, click, rub or gallop  Breasts:   normal without suspicious masses, skin or nipple changes or axillary nodes  Abdomen:  normal findings: no organomegaly, soft, non-tender and no hernia  Pelvis:  External genitalia: normal general appearance Urinary system: urethral meatus normal and bladder without fullness, nontender Vaginal: normal without tenderness, induration or masses Cervix: normal appearance Adnexa: normal bimanual exam Uterus: anteverted and non-tender, normal size    50% of 15 min visit spent on counseling and coordination of care.   Data Reviewed Wet Prep U/A, Urinalysis  Assessment     1. Abnormal uterine bleeding (AUB) Rx: - POCT  urinalysis dipstick - POCT urine pregnancy  2. Vaginal discharge Rx: - Cervicovaginal ancillary only( [place]) - fluconazole (DIFLUCAN) 150 MG tablet; Take 1 tablet (150 mg total) by mouth once for 1 dose.  Dispense: 1 tablet; Refill: 2  3. Acute cystitis with hematuria Rx: - cefUROXime (CEFTIN) 500 MG tablet; Take 1 tablet (500 mg total) by mouth 2 (two) times daily with a meal.  Dispense: 14 tablet; Refill: 2  4. BV (bacterial vaginosis) Rx: - metroNIDAZOLE (METROGEL VAGINAL) 0.75 % vaginal gel; Place 1 Applicatorful vaginally 2 (two) times daily.  Dispense: 70 g; Refill: 5  5. Candida vaginitis Rx: - fluconazole (DIFLUCAN) 150 MG tablet; Take 1 tablet (150 mg total) by mouth once for 1 dose.  Dispense: 1 tablet; Refill: 2    Plan   Follow up 6 months  Orders Placed This Encounter  Procedures  . POCT urinalysis dipstick  . POCT urine pregnancy   Meds ordered this encounter  Medications  . metroNIDAZOLE (METROGEL VAGINAL) 0.75 % vaginal gel    Sig: Place 1 Applicatorful vaginally 2 (two) times daily.    Dispense:  70 g    Refill:  5  . cefUROXime (CEFTIN) 500 MG tablet    Sig: Take 1 tablet (500 mg total) by mouth 2 (two) times daily with a meal.    Dispense:  14 tablet    Refill:  2  . fluconazole (DIFLUCAN) 150 MG tablet    Sig: Take 1 tablet (150 mg total) by mouth once for 1 dose.    Dispense:  1 tablet    Refill:  2     Brock Bad, MD 10/21/2019 9:08 AM

## 2019-10-21 NOTE — Progress Notes (Signed)
Pt states she may have had kidney infection, was having flank pain- is doing better now.  Pt complains of spotting with Nexplanon. Pt also having increase in discharge with some vaginal itching.

## 2019-10-22 ENCOUNTER — Other Ambulatory Visit: Payer: Self-pay | Admitting: Obstetrics

## 2019-10-22 DIAGNOSIS — B3731 Acute candidiasis of vulva and vagina: Secondary | ICD-10-CM

## 2019-10-22 DIAGNOSIS — B373 Candidiasis of vulva and vagina: Secondary | ICD-10-CM

## 2019-10-22 LAB — CERVICOVAGINAL ANCILLARY ONLY
Bacterial Vaginitis (gardnerella): NEGATIVE
Candida Glabrata: NEGATIVE
Candida Vaginitis: POSITIVE — AB
Chlamydia: NEGATIVE
Comment: NEGATIVE
Comment: NEGATIVE
Comment: NEGATIVE
Comment: NEGATIVE
Comment: NEGATIVE
Comment: NORMAL
Neisseria Gonorrhea: NEGATIVE
Trichomonas: NEGATIVE

## 2019-10-22 MED ORDER — FLUCONAZOLE 150 MG PO TABS: 150.0000 mg | ORAL_TABLET | Freq: Once | ORAL | 0 refills | Status: AC

## 2019-10-23 LAB — URINE CULTURE: Organism ID, Bacteria: NO GROWTH

## 2019-12-14 ENCOUNTER — Other Ambulatory Visit (HOSPITAL_COMMUNITY)
Admission: RE | Admit: 2019-12-14 | Discharge: 2019-12-14 | Disposition: A | Discharge status: Home / Self Care | Payer: Medicaid Other | Source: Ambulatory Visit | Attending: Obstetrics | Admitting: Obstetrics

## 2019-12-14 ENCOUNTER — Other Ambulatory Visit: Payer: Self-pay

## 2019-12-14 ENCOUNTER — Ambulatory Visit (INDEPENDENT_AMBULATORY_CARE_PROVIDER_SITE_OTHER): Payer: Medicaid Other

## 2019-12-14 DIAGNOSIS — N898 Other specified noninflammatory disorders of vagina: Secondary | ICD-10-CM | POA: Diagnosis not present

## 2019-12-14 DIAGNOSIS — R1031 Right lower quadrant pain: Secondary | ICD-10-CM | POA: Diagnosis not present

## 2019-12-14 LAB — POCT URINALYSIS DIPSTICK
Bilirubin, UA: NEGATIVE
Glucose, UA: NEGATIVE
Ketones, UA: NEGATIVE
Leukocytes, UA: NEGATIVE
Nitrite, UA: NEGATIVE
Protein, UA: NEGATIVE
Spec Grav, UA: 1.015 (ref 1.010–1.025)
Urobilinogen, UA: 0.2 E.U./dL
pH, UA: 7 (ref 5.0–8.0)

## 2019-12-14 NOTE — Progress Notes (Addendum)
SUBJECTIVE:  29 y.o. female complains of white vaginal discharge for couple of days and R "kidney" pain. Denies abnormal vaginal bleeding. Denies history of known exposure to STD.  OBJECTIVE:  She appears well, afebrile. Urine dipstick: Trace Hgb - pt on menses negative for all other components.  ASSESSMENT:  Vaginal Discharge  Vaginal Odor   PLAN:  GC, chlamydia, trichomonas, BVAG, CVAG probe sent to lab. Treatment: To be determined once lab results are received ROV prn if symptoms persist or worsen.

## 2019-12-14 NOTE — Progress Notes (Signed)
Patient was assessed and managed by nursing staff during this encounter. I have reviewed the chart and agree with the documentation and plan. I have also made any necessary editorial changes.  Catalina Antigua, MD 12/14/2019 12:58 PM

## 2019-12-16 LAB — URINE CULTURE

## 2019-12-17 ENCOUNTER — Other Ambulatory Visit: Payer: Self-pay | Admitting: Obstetrics and Gynecology

## 2019-12-17 DIAGNOSIS — Z419 Encounter for procedure for purposes other than remedying health state, unspecified: Secondary | ICD-10-CM | POA: Diagnosis not present

## 2019-12-17 LAB — CERVICOVAGINAL ANCILLARY ONLY
Bacterial Vaginitis (gardnerella): POSITIVE — AB
Candida Glabrata: NEGATIVE
Candida Vaginitis: NEGATIVE
Chlamydia: NEGATIVE
Comment: NEGATIVE
Comment: NEGATIVE
Comment: NEGATIVE
Comment: NEGATIVE
Comment: NEGATIVE
Comment: NORMAL
Neisseria Gonorrhea: NEGATIVE
Trichomonas: NEGATIVE

## 2019-12-17 MED ORDER — METRONIDAZOLE 500 MG PO TABS
500.0000 mg | ORAL_TABLET | Freq: Two times a day (BID) | ORAL | 0 refills | Status: DC
Start: 2019-12-17 — End: 2020-01-29

## 2020-01-17 DIAGNOSIS — Z419 Encounter for procedure for purposes other than remedying health state, unspecified: Secondary | ICD-10-CM | POA: Diagnosis not present

## 2020-01-29 ENCOUNTER — Ambulatory Visit (HOSPITAL_COMMUNITY)
Admission: EM | Admit: 2020-01-29 | Discharge: 2020-01-29 | Disposition: A | Discharge status: Home / Self Care | Payer: Medicaid Other | Attending: Family Medicine | Admitting: Family Medicine

## 2020-01-29 ENCOUNTER — Other Ambulatory Visit: Payer: Self-pay

## 2020-01-29 ENCOUNTER — Encounter (HOSPITAL_COMMUNITY): Payer: Self-pay

## 2020-01-29 DIAGNOSIS — R062 Wheezing: Secondary | ICD-10-CM | POA: Insufficient documentation

## 2020-01-29 DIAGNOSIS — R05 Cough: Secondary | ICD-10-CM | POA: Insufficient documentation

## 2020-01-29 DIAGNOSIS — D563 Thalassemia minor: Secondary | ICD-10-CM | POA: Insufficient documentation

## 2020-01-29 DIAGNOSIS — Z20822 Contact with and (suspected) exposure to covid-19: Secondary | ICD-10-CM | POA: Diagnosis not present

## 2020-01-29 DIAGNOSIS — Z87891 Personal history of nicotine dependence: Secondary | ICD-10-CM | POA: Insufficient documentation

## 2020-01-29 DIAGNOSIS — J069 Acute upper respiratory infection, unspecified: Secondary | ICD-10-CM | POA: Diagnosis not present

## 2020-01-29 LAB — POC URINE PREG, ED: Preg Test, Ur: NEGATIVE

## 2020-01-29 MED ORDER — PREDNISONE 10 MG (21) PO TBPK
ORAL_TABLET | Freq: Every day | ORAL | 0 refills | Status: DC
Start: 2020-01-29 — End: 2021-02-20

## 2020-01-29 MED ORDER — PSEUDOEPH-BROMPHEN-DM 30-2-10 MG/5ML PO SYRP
5.0000 mL | ORAL_SOLUTION | Freq: Four times a day (QID) | ORAL | 0 refills | Status: DC | PRN
Start: 2020-01-29 — End: 2021-02-20

## 2020-01-29 NOTE — ED Triage Notes (Signed)
Pt c/o productive cough w/green mucous. Chest congestionx5 days.

## 2020-01-29 NOTE — Discharge Instructions (Signed)
You have been tested for COVID-19 today. °If your test returns positive, you will receive a phone call from [facility] regarding your results. °Negative test results are not called. °Both positive and negative results area always visible on MyChart. °If you do not have a MyChart account, sign up instructions are provided in your discharge papers. °Please do not hesitate to contact us should you have questions or concerns. ° °

## 2020-01-30 LAB — SARS CORONAVIRUS 2 (TAT 6-24 HRS): SARS Coronavirus 2: NEGATIVE

## 2020-01-30 NOTE — ED Provider Notes (Signed)
[facility]   [id number] 01/29/20 Arrival Time: 1728  ASSESSMENT & PLAN:  1. Viral URI with cough   2. Wheezing     Meds ordered this encounter  Medications  . brompheniramine-pseudoephedrine-DM 30-2-10 MG/5ML syrup    Sig: Take 5 mLs by mouth 4 (four) times daily as needed.    Dispense:  120 mL    Refill:  0  . predniSONE (STERAPRED UNI-PAK 21 TAB) 10 MG (21) TBPK tablet    Sig: Take by mouth daily. Take as directed.    Dispense:  21 tablet    Refill:  0    COVID-19 testing sent. See letter/work note on file for self-isolation guidelines. OTC symptom care as needed.   Follow-up Information    Rometta Emery, MD.   Specialty: Internal Medicine Why: As needed. Contact information: [address]  [place] [address] [phone]               Reviewed expectations re: course of current medical issues. Questions answered. Outlined signs and symptoms indicating need for more acute intervention. Understanding verbalized. After Visit Summary given.   SUBJECTIVE: History from: patient. Stacey [name] is a 29 y.o. female who requests COVID-19 testing. Known COVID-19 contact: unknown. Recent travel: none. Reports: 4-5 d of coughing, nasal congestion; children with similar now. Denies: fever, difficulty breathing and headache. Questions wheezing at times. Normal PO intake without n/v/d.    OBJECTIVE:  Vitals:   01/29/20 1843 01/29/20 1844  BP: 111/69   Pulse: 90   Resp: 16   Temp: 98.3 F (36.8 C)   TempSrc: Oral   SpO2: 100%   Weight:  72.6 kg  Height:  5' (1.524 m)    General appearance: alert; no distress Eyes: PERRLA; EOMI; conjunctiva normal HENT: [place]; AT; nasal congestion Neck: supple  Lungs: speaks full sentences without difficulty; unlabored; mild exp wheezing bilterally Extremities: no edema Skin: warm and dry Neurologic: normal gait Psychological: alert and cooperative; normal mood and affect  Labs:  Labs Reviewed    SARS CORONAVIRUS 2 (TAT 6-24 HRS)  POC URINE PREG, ED     No Known Allergies  Past Medical History:  Diagnosis Date  . Alpha thalassemia silent carrier 10/11/2018   [x]  Genetic counseling [ ] FOB testing  . GERD (gastroesophageal reflux disease)   . 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 Kidney infection    kidney infection 2012  . Migraine    Social History   Socioeconomic History  . Marital status: Single    Spouse name: Not on file  . Number of children: 2  . Years of education: 51  . Highest education level: High school graduate  Occupational History  . Occupation: customer service  Tobacco Use  . Smoking status: Former Smoker    Types: Cigars    Quit date: 01/25/2019    Years since quitting: 1.0  . Smokeless tobacco: Never Used  . Tobacco comment: black and mild-half of one every other day  Vaping Use  . Vaping Use: Never used  Substance and Sexual Activity  . Alcohol use: Yes    Comment: occ  . Drug use: No  . Sexual activity: Yes    Birth control/protection: None    Comment: last IC-2 days ago   Other Topics Concern  . Not on file  Social History Narrative   Lives at home with her children.   Right-handed.   Caffeine use: 2 cups per day.  Social Determinants of Health   Financial Resource Strain:   . Difficulty of Paying Living Expenses:   Food Insecurity:   . Worried About Programme researcher, broadcasting/film/video in the Last Year:   . Barista in the Last Year:   Transportation Needs:   . Freight forwarder (Medical):   Marland Kitchen Lack of Transportation (Non-Medical):   Physical Activity:   . Days of Exercise per Week:   . Minutes of Exercise per Session:   Stress:   . Feeling of Stress :   Social Connections:   . Frequency of Communication with Friends and Family:   . Frequency of Social Gatherings with Friends and Family:   . Attends Religious Services:   . Active Member of Clubs or Organizations:   . Attends Banker Meetings:   Marland Kitchen Marital  Status:   Intimate Partner Violence:   . Fear of Current or Ex-Partner:   . Emotionally Abused:   Marland Kitchen Physically Abused:   . Sexually Abused:    Family History  Problem Relation Age of Onset  . Hypertension Mother   . Migraines Mother   . Asthma Brother   . Arthritis Maternal Grandmother   . Cancer Maternal Grandmother   . Arthritis Paternal Grandmother   . Healthy Father   . Anesthesia problems Neg Hx    Past Surgical History:  Procedure Laterality Date  . NO PAST SURGERIES       Mardella Layman, MD 01/30/20 [phone]

## 2020-02-17 DIAGNOSIS — Z419 Encounter for procedure for purposes other than remedying health state, unspecified: Secondary | ICD-10-CM | POA: Diagnosis not present

## 2020-03-18 DIAGNOSIS — Z419 Encounter for procedure for purposes other than remedying health state, unspecified: Secondary | ICD-10-CM | POA: Diagnosis not present

## 2020-04-18 DIAGNOSIS — Z419 Encounter for procedure for purposes other than remedying health state, unspecified: Secondary | ICD-10-CM | POA: Diagnosis not present

## 2020-05-09 ENCOUNTER — Other Ambulatory Visit: Payer: Self-pay

## 2020-05-09 MED ORDER — FLUCONAZOLE 150 MG PO TABS
150.0000 mg | ORAL_TABLET | Freq: Once | ORAL | 0 refills | Status: AC
Start: 2020-05-09 — End: 2020-05-09

## 2020-05-09 NOTE — Telephone Encounter (Signed)
Refill on fluconazole 150mg 

## 2020-05-16 DIAGNOSIS — F331 Major depressive disorder, recurrent, moderate: Secondary | ICD-10-CM | POA: Diagnosis not present

## 2020-05-17 ENCOUNTER — Other Ambulatory Visit: Payer: Self-pay | Admitting: Obstetrics and Gynecology

## 2020-05-18 DIAGNOSIS — Z419 Encounter for procedure for purposes other than remedying health state, unspecified: Secondary | ICD-10-CM | POA: Diagnosis not present

## 2020-05-18 DIAGNOSIS — F331 Major depressive disorder, recurrent, moderate: Secondary | ICD-10-CM | POA: Diagnosis not present

## 2020-05-19 DIAGNOSIS — F331 Major depressive disorder, recurrent, moderate: Secondary | ICD-10-CM | POA: Diagnosis not present

## 2020-06-08 DIAGNOSIS — F331 Major depressive disorder, recurrent, moderate: Secondary | ICD-10-CM | POA: Diagnosis not present

## 2020-06-18 DIAGNOSIS — Z419 Encounter for procedure for purposes other than remedying health state, unspecified: Secondary | ICD-10-CM | POA: Diagnosis not present

## 2020-06-29 DIAGNOSIS — F331 Major depressive disorder, recurrent, moderate: Secondary | ICD-10-CM | POA: Diagnosis not present

## 2020-07-06 DIAGNOSIS — F331 Major depressive disorder, recurrent, moderate: Secondary | ICD-10-CM | POA: Diagnosis not present

## 2020-07-13 DIAGNOSIS — F331 Major depressive disorder, recurrent, moderate: Secondary | ICD-10-CM | POA: Diagnosis not present

## 2020-07-19 DIAGNOSIS — Z419 Encounter for procedure for purposes other than remedying health state, unspecified: Secondary | ICD-10-CM | POA: Diagnosis not present

## 2020-07-20 DIAGNOSIS — F331 Major depressive disorder, recurrent, moderate: Secondary | ICD-10-CM | POA: Diagnosis not present

## 2020-07-30 ENCOUNTER — Encounter (HOSPITAL_COMMUNITY): Payer: Self-pay | Admitting: Emergency Medicine

## 2020-07-30 ENCOUNTER — Emergency Department (HOSPITAL_COMMUNITY): Payer: Medicaid Other

## 2020-07-30 ENCOUNTER — Other Ambulatory Visit: Payer: Self-pay

## 2020-07-30 ENCOUNTER — Emergency Department (HOSPITAL_COMMUNITY)
Admission: EM | Admit: 2020-07-30 | Discharge: 2020-07-30 | Disposition: A | Discharge status: Home / Self Care | Payer: Medicaid Other | Attending: Emergency Medicine | Admitting: Emergency Medicine

## 2020-07-30 DIAGNOSIS — M546 Pain in thoracic spine: Secondary | ICD-10-CM | POA: Diagnosis not present

## 2020-07-30 DIAGNOSIS — R079 Chest pain, unspecified: Secondary | ICD-10-CM | POA: Diagnosis not present

## 2020-07-30 DIAGNOSIS — R0789 Other chest pain: Secondary | ICD-10-CM | POA: Diagnosis not present

## 2020-07-30 DIAGNOSIS — Z87891 Personal history of nicotine dependence: Secondary | ICD-10-CM | POA: Insufficient documentation

## 2020-07-30 MED ORDER — LIDOCAINE 5 % EX PTCH: 1.0000 | MEDICATED_PATCH | CUTANEOUS | 0 refills | Status: DC

## 2020-07-30 MED ORDER — METHOCARBAMOL 500 MG PO TABS
500.0000 mg | ORAL_TABLET | Freq: Once | ORAL | Status: AC
  Administered 2020-07-30: 500 mg via ORAL
  Filled 2020-07-30: qty 1

## 2020-07-30 MED ORDER — OXYCODONE-ACETAMINOPHEN 5-325 MG PO TABS
1.0000 | ORAL_TABLET | Freq: Once | ORAL | Status: AC
  Administered 2020-07-30: 1 via ORAL
  Filled 2020-07-30: qty 1

## 2020-07-30 MED ORDER — METHOCARBAMOL 500 MG PO TABS: 500.0000 mg | ORAL_TABLET | Freq: Two times a day (BID) | ORAL | 0 refills | Status: DC

## 2020-07-30 MED ORDER — NAPROXEN 500 MG PO TABS: 500.0000 mg | ORAL_TABLET | Freq: Two times a day (BID) | ORAL | 0 refills | Status: DC

## 2020-07-30 NOTE — Discharge Instructions (Signed)
Take the prescribed medication as directed.  Sent to pharmacy for you.  I would continue to try heating pad and gentle stretching to see if this helps along with medications. Follow-up with your primary care doctor. Return to the ED for new or worsening symptoms.

## 2020-07-30 NOTE — ED Provider Notes (Signed)
[facility]-EMERGENCY DEPT Provider Note   CSN: [id number] Arrival date & time: 07/30/20  0357     History Chief Complaint  Patient presents with  . Back Pain    Stacey [name] is a 30 y.o. female.  The history is provided by the patient and medical records.  Back Pain   30 y.o. F with hx of GERD, migraine headaches, presenting to the ED for back pain.  States she woke up 2 days ago with back pain in the middle of her back.  States initially it was mild but worsened today.  States she woke up this morning and felt like her back was "stiff".  Pain increases with movement of the arm, twisting motion, changing position, or taking deep breaths.  She denies feeling SOB or palpitations.  No cough or recent URI symptoms.  States she feels like her chest is getting tight now since she arrived in ED.  She denies hx of DVT or PE.  Denies recent travel, surgery, prolonged immobilization, leg swelling or OCP use.   No meds tried PTA.  Past Medical History:  Diagnosis Date  . Alpha thalassemia silent carrier 10/11/2018   [x]  Genetic counseling [ ] FOB testing  . GERD (gastroesophageal reflux disease)   . 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 Kidney infection    kidney infection 2012  . Migraine     Patient Active Problem List   Diagnosis Date Noted  . Shoulder dystocia during labor and delivery, delivered 04/13/2019  . Indication for care in labor and delivery, antepartum 04/12/2019  . History of shoulder dystocia in prior pregnancy 04/12/2019  . Group beta Strep positive 03/27/2019  . Chronic migraine 03/02/2019  . Tobacco use during pregnancy 01/22/2019  . Abnormal chromosomal and genetic finding on antenatal screening mother 10/11/2018  . Alpha thalassemia silent carrier 10/11/2018  . Bacterial vaginosis 10/05/2018  . Supervision of other normal pregnancy, antepartum 09/03/2018  . Hx of preeclampsia, prior pregnancy, currently pregnant, third trimester  11/09/2016  . 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 Major depressive disorder, recurrent, severe without psychotic features (HCC)   . Former smoker 07/04/2014    Past Surgical History:  Procedure Laterality Date  . NO PAST SURGERIES       OB History    Gravida  6   Para  3   Term  3   Preterm  0   AB  3   Living  3     SAB  0   IAB  2   Ectopic  0   Multiple  0   Live Births  3           Family History  Problem Relation Age of Onset  . Hypertension Mother   . Migraines Mother   . Asthma Brother   . Arthritis Maternal Grandmother   . Cancer Maternal Grandmother   . Arthritis Paternal Grandmother   . Healthy Father   . Anesthesia problems Neg Hx     Social History   Tobacco Use  . Smoking status: Former Smoker    Types: Cigars    Quit date: 01/25/2019    Years since quitting: 1.5  . Smokeless tobacco: Never Used  . Tobacco comment: black and mild-half of one every other day  Vaping Use  . Vaping Use: Never used  Substance Use Topics  . Alcohol use: Yes    Comment: occ  . Drug use: No  Home Medications Prior to Admission medications   Medication Sig Start Date End Date Taking? Authorizing Provider  brompheniramine-pseudoephedrine-DM 30-2-10 MG/5ML syrup Take 5 mLs by mouth 4 (four) times daily as needed. 01/29/20   Mardella Layman, MD  ibuprofen (ADVIL) 600 MG tablet Take 1 tablet (600 mg total) by mouth every 6 (six) hours. Patient not taking: Reported on 10/21/2019 04/15/19   Vania Rea, MD  predniSONE (STERAPRED UNI-PAK 21 TAB) 10 MG (21) TBPK tablet Take by mouth daily. Take as directed. 01/29/20   Mardella Layman, MD  Prenat-Fe Poly-Methfol-FA-DHA (VITAFOL ULTRA) 29-0.6-0.4-200 MG CAPS Take 1 tablet by mouth daily. Patient not taking: Reported on 10/21/2019 05/11/19   Constant, Peggy, MD  Prenatal Vit-Fe Phos-FA-Omega (VITAFOL GUMMIES) 3.33-0.333-34.8 MG CHEW Chew 3 tablets by mouth daily before breakfast. Patient not taking: Reported  on 05/11/2019 09/03/18   Brock Bad, MD  valACYclovir (VALTREX) 500 MG tablet Take 1 tablet (500 mg total) by mouth 2 (two) times daily. Patient not taking: Reported on 05/11/2019 03/24/19   Gerrit Heck, CNM    Allergies    Patient has no known allergies.  Review of Systems   Review of Systems  Musculoskeletal: Positive for back pain.  All other systems reviewed and are negative.   Physical Exam Updated Vital Signs BP 117/75 (BP Location: Left Arm)   Pulse 93   Temp 98.3 F (36.8 C) (Oral)   Resp 18   Ht 5' (1.524 m)   Wt 70.8 kg   SpO2 97%   BMI 30.47 kg/m   Physical Exam Vitals and nursing note reviewed.  Constitutional:      Appearance: She is well-developed and well-nourished.  HENT:     Head: Normocephalic and atraumatic.     Mouth/Throat:     Mouth: Oropharynx is clear and moist.  Eyes:     Extraocular Movements: EOM normal.     Conjunctiva/sclera: Conjunctivae normal.     Pupils: Pupils are equal, round, and reactive to light.  Cardiovascular:     Rate and Rhythm: Normal rate and regular rhythm.     Heart sounds: Normal heart sounds.     Comments: RRR Pulmonary:     Effort: Pulmonary effort is normal.     Breath sounds: Normal breath sounds. No wheezing or rhonchi.     Comments: Lungs clear Abdominal:     General: Bowel sounds are normal.     Palpations: Abdomen is soft.  Musculoskeletal:        General: Normal range of motion.     Cervical back: Normal range of motion.       Back:     Comments: Reproducible tenderness of thoracic paraspinal musculature, R > L, pain elicited with movement of the arms, twisting, bending over, etc.  Skin:    General: Skin is warm and dry.  Neurological:     Mental Status: She is alert and oriented to person, place, and time.  Psychiatric:        Mood and Affect: Mood and affect normal.     Comments: Somewhat anxious, tearful     ED Results / Procedures / Treatments   Labs (all labs ordered are listed,  but only abnormal results are displayed) Labs Reviewed - No data to display  EKG None  Radiology No results found.  Procedures Procedures   Medications Ordered in ED Medications  oxyCODONE-acetaminophen (PERCOCET/ROXICET) 5-325 MG per tablet 1 tablet (1 tablet Oral Given 07/30/20 0436)  methocarbamol (ROBAXIN) tablet 500 mg (500  mg Oral Given 07/30/20 0436)    ED Course  I have reviewed the triage vital signs and the nursing notes.  Pertinent labs & imaging results that were available during my care of the patient were reviewed by me and considered in my medical decision making (see chart for details).    MDM Rules/Calculators/A&P  30 year old female presenting to the ED with back pain. This began 2 days ago but states worse this morning upon waking. States now her back feels "stiff". Pain worse with movement of the arms, bending over, twisting, etc. Upon arrival to ED she felt like her chest was tight. She does appear somewhat anxious on exam and is tearful. She is afebrile and nontoxic. She has not had any cough or other upper respiratory symptoms. She does have reproducible tenderness to the thoracic musculature of the back, right worse than left. Pain is also elicited with movement of the arms or when she is bending over. Her vitals are stable on room air without any noted tachycardia or hypoxia. She has no noted cardiac risk factors and is PERC negative. Low suspicion for ACS or PE. Screening EKG and chest x-ray obtained which are both negative. Treated here in the emergency department and states no improvement, however on repeat evaluation she actually appears much more comfortable and is relaxed in bed and is no longer tearful. Suspect this is muscular in nature. Plan to discharge home with symptomatic care.  Close follow-up with PCP.  Return here for any new/acute changes.  Final Clinical Impression(s) / ED Diagnoses Final diagnoses:  Acute bilateral thoracic back pain    Rx /  DC Orders ED Discharge Orders         Ordered    methocarbamol (ROBAXIN) 500 MG tablet  2 times daily        07/30/20 0529    naproxen (NAPROSYN) 500 MG tablet  2 times daily with meals        07/30/20 0529    lidocaine (LIDODERM) 5 %  Every 24 hours        07/30/20 0529           Garlon Hatchet, PA-C 07/30/20 [id number]    Maia Plan, MD 07/31/20 [phone]

## 2020-07-30 NOTE — ED Triage Notes (Signed)
Patient here from home reporting upper back pain that started 2 days ago increased today with taking deep breath. Denies pregnancy. No hx of clot.

## 2020-08-10 DIAGNOSIS — F331 Major depressive disorder, recurrent, moderate: Secondary | ICD-10-CM | POA: Diagnosis not present

## 2020-08-16 DIAGNOSIS — Z419 Encounter for procedure for purposes other than remedying health state, unspecified: Secondary | ICD-10-CM | POA: Diagnosis not present

## 2020-08-31 DIAGNOSIS — F331 Major depressive disorder, recurrent, moderate: Secondary | ICD-10-CM | POA: Diagnosis not present

## 2020-09-12 DIAGNOSIS — F331 Major depressive disorder, recurrent, moderate: Secondary | ICD-10-CM | POA: Diagnosis not present

## 2020-09-16 DIAGNOSIS — Z419 Encounter for procedure for purposes other than remedying health state, unspecified: Secondary | ICD-10-CM | POA: Diagnosis not present

## 2020-09-21 ENCOUNTER — Ambulatory Visit: Payer: Medicaid Other | Admitting: Obstetrics

## 2020-10-13 ENCOUNTER — Encounter: Payer: Self-pay | Admitting: Obstetrics

## 2020-10-13 ENCOUNTER — Ambulatory Visit (INDEPENDENT_AMBULATORY_CARE_PROVIDER_SITE_OTHER): Payer: Medicaid Other | Admitting: Obstetrics

## 2020-10-13 ENCOUNTER — Other Ambulatory Visit (HOSPITAL_COMMUNITY)
Admission: RE | Admit: 2020-10-13 | Discharge: 2020-10-13 | Disposition: A | Discharge status: Home / Self Care | Payer: Medicaid Other | Source: Ambulatory Visit | Attending: Obstetrics | Admitting: Obstetrics

## 2020-10-13 ENCOUNTER — Other Ambulatory Visit: Payer: Self-pay

## 2020-10-13 VITALS — BP 116/77 | HR 73 | Wt 158.0 lb

## 2020-10-13 DIAGNOSIS — Z3046 Encounter for surveillance of implantable subdermal contraceptive: Secondary | ICD-10-CM | POA: Diagnosis not present

## 2020-10-13 DIAGNOSIS — N898 Other specified noninflammatory disorders of vagina: Secondary | ICD-10-CM | POA: Diagnosis not present

## 2020-10-13 DIAGNOSIS — B373 Candidiasis of vulva and vagina: Secondary | ICD-10-CM

## 2020-10-13 DIAGNOSIS — B3731 Acute candidiasis of vulva and vagina: Secondary | ICD-10-CM

## 2020-10-13 LAB — CERVICOVAGINAL ANCILLARY ONLY
Bacterial Vaginitis (gardnerella): NEGATIVE
Candida Glabrata: NEGATIVE
Candida Vaginitis: POSITIVE — AB
Chlamydia: NEGATIVE
Comment: NEGATIVE
Comment: NEGATIVE
Comment: NEGATIVE
Comment: NEGATIVE
Comment: NEGATIVE
Comment: NORMAL
Neisseria Gonorrhea: NEGATIVE
Trichomonas: NEGATIVE

## 2020-10-13 MED ORDER — FLUCONAZOLE 150 MG PO TABS: 150.0000 mg | ORAL_TABLET | Freq: Once | ORAL | 0 refills | Status: AC

## 2020-10-13 NOTE — Progress Notes (Signed)
NEXPLANON REMOVAL NOTE  Date of LMP:   unknown  Contraception used: *Nexplanon   Indications:  The patient desires contraception.  She understands risks, benefits, and alternatives to Implanon and would like to proceed.  Anesthesia:   Lidocaine 1% plain.  Procedure:  A time-out was performed confirming the procedure and the patient's allergy status.  Complications: None                      The rod was palpated and the area was sterilely prepped.  The area beneath the distal tip was anesthetized with 1% xylocaine and the skin incised                       Over the tip and the tip was exposed, grasped with forcep and removed intact.  A single suture of 4-0 Vicryl was used to close incision.  Steri strip                       And a bandage applied and the arm was wrapped with gauze bandage.  The patient tolerated well.  Instructions:  The patient was instructed to remove the dressing in 24 hours and that some bruising is to be expected.  She was advised to use over the counter analgesics as needed for any pain at the site.  She is to keep the area dry for 24 hours and to call if her hand or arm becomes cold, numb, or blue.  Return visit:  Return in 2 weeks   Brock Bad, MD 10/13/2020 8:34 AM

## 2020-10-13 NOTE — Addendum Note (Signed)
Addended by: Coral Ceo A on: 10/13/2020 09:09 AM   Modules accepted: Orders

## 2020-10-13 NOTE — Progress Notes (Signed)
Pt would like self swab today, having white creamy d/c with itching.  Pt does not want any other BC method at this time.

## 2020-10-15 ENCOUNTER — Other Ambulatory Visit: Payer: Self-pay | Admitting: Obstetrics

## 2020-10-15 DIAGNOSIS — B3731 Acute candidiasis of vulva and vagina: Secondary | ICD-10-CM

## 2020-10-15 DIAGNOSIS — B373 Candidiasis of vulva and vagina: Secondary | ICD-10-CM

## 2020-10-15 MED ORDER — FLUCONAZOLE 150 MG PO TABS: 150.0000 mg | ORAL_TABLET | Freq: Once | ORAL | 0 refills | Status: AC

## 2020-10-16 DIAGNOSIS — Z419 Encounter for procedure for purposes other than remedying health state, unspecified: Secondary | ICD-10-CM | POA: Diagnosis not present

## 2020-10-18 DIAGNOSIS — F331 Major depressive disorder, recurrent, moderate: Secondary | ICD-10-CM | POA: Diagnosis not present

## 2020-10-19 DIAGNOSIS — M549 Dorsalgia, unspecified: Secondary | ICD-10-CM | POA: Diagnosis not present

## 2020-10-19 DIAGNOSIS — G43909 Migraine, unspecified, not intractable, without status migrainosus: Secondary | ICD-10-CM | POA: Diagnosis not present

## 2020-10-26 ENCOUNTER — Ambulatory Visit: Payer: Medicaid Other | Admitting: Obstetrics

## 2020-10-26 DIAGNOSIS — F331 Major depressive disorder, recurrent, moderate: Secondary | ICD-10-CM | POA: Diagnosis not present

## 2020-10-27 ENCOUNTER — Ambulatory Visit: Payer: Medicaid Other | Admitting: Obstetrics

## 2020-10-27 IMAGING — US OBSTETRIC <14 WK ULTRASOUND
1 series · 15 of 19 positions shown · non-contrast
Comparison: None.

CLINICAL DATA: Right lower quadrant pain

EXAM:
OBSTETRIC <14 WK ULTRASOUND
TECHNIQUE: Transabdominal ultrasound was performed for evaluation of the
gestation as well as the maternal uterus and adnexal regions.

[Series 1: obstetric <14 wk ultrasound · 15 of 19 slices shown]
[im 1/19]
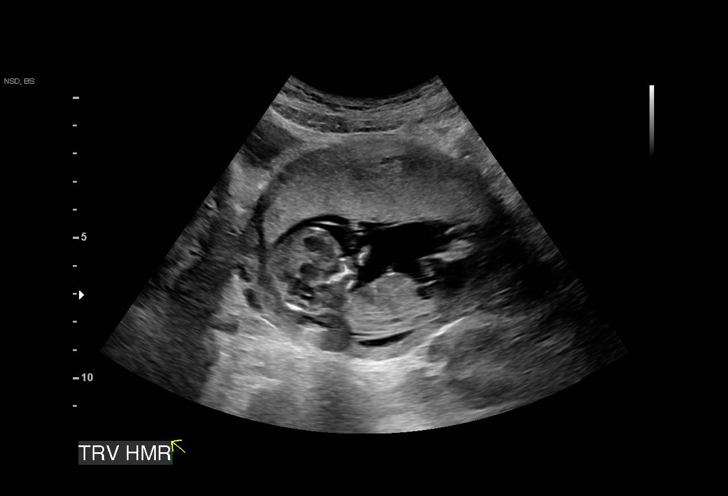
[im 2/19]
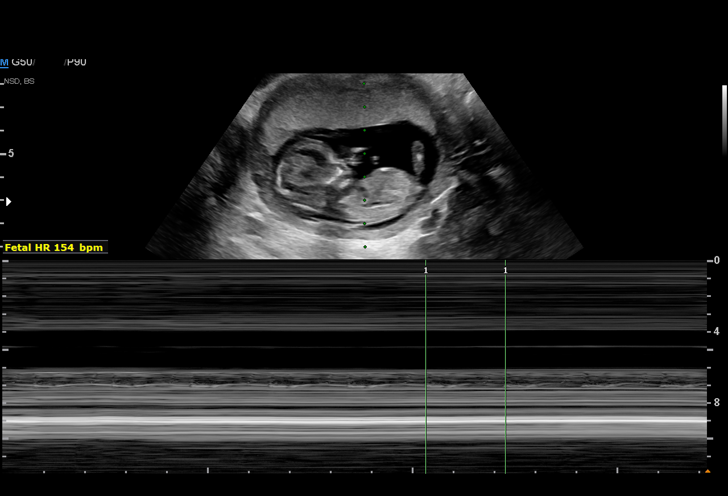
[im 4/19]
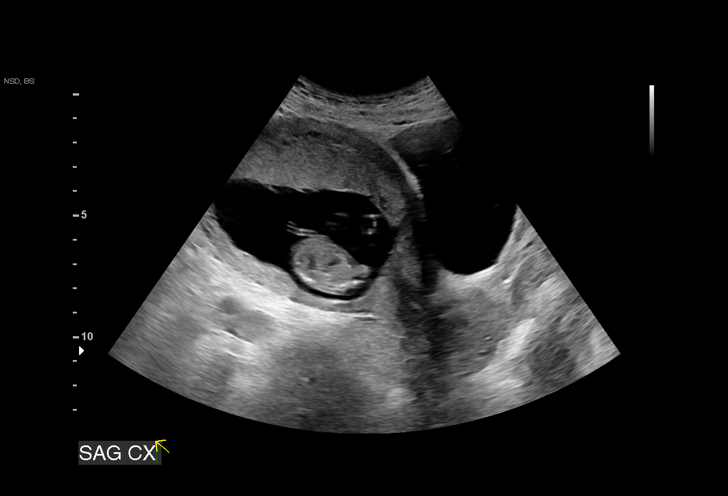
[im 5/19]
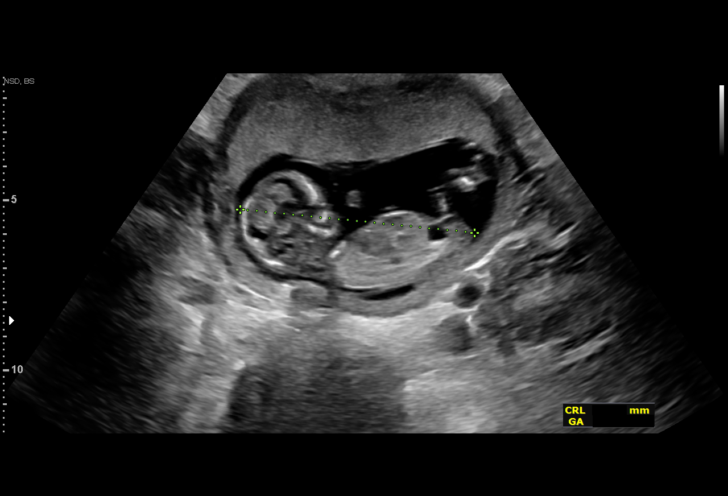
[im 6/19]
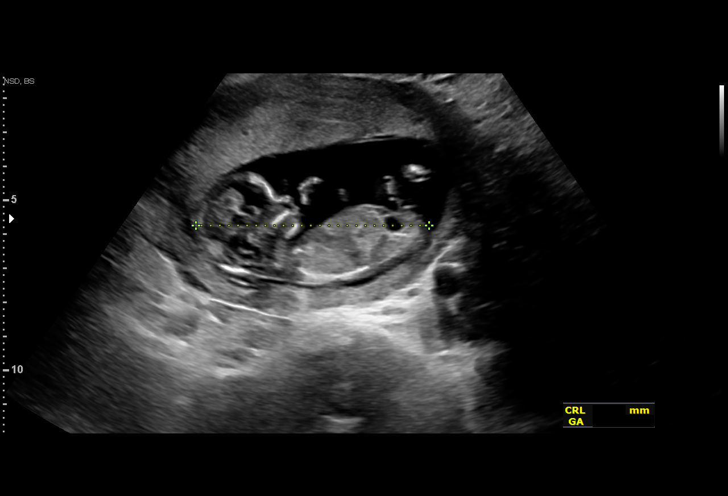
[im 7/19]
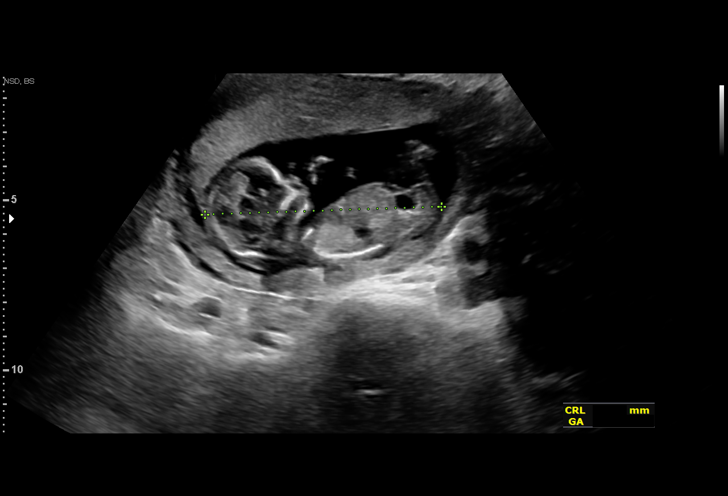
[im 9/19]
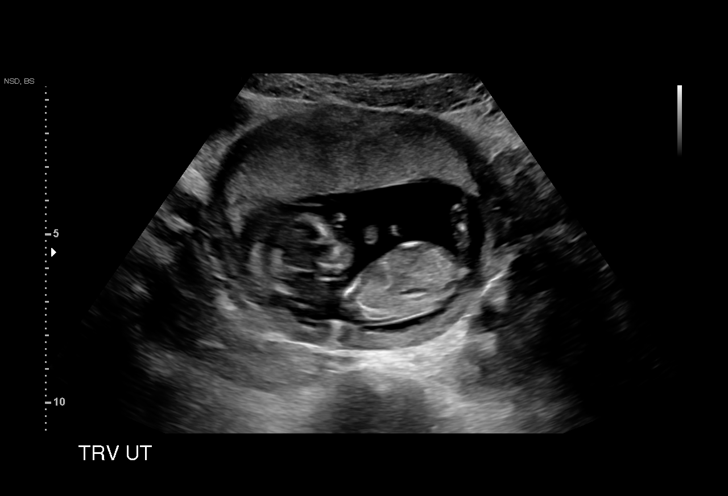
[im 10/19]
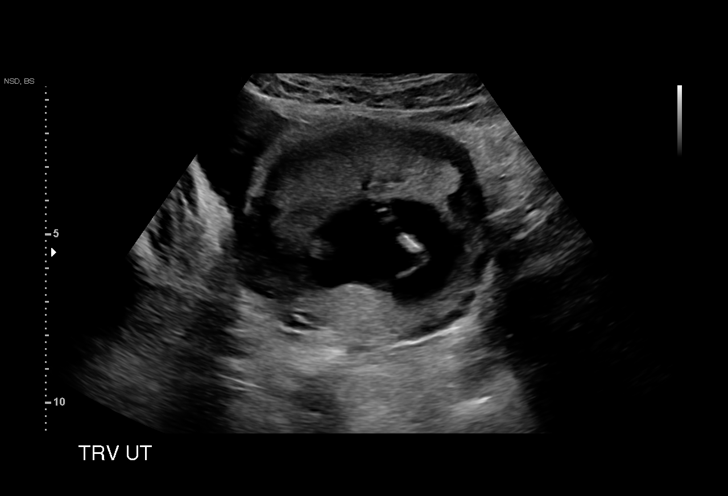
[im 11/19]
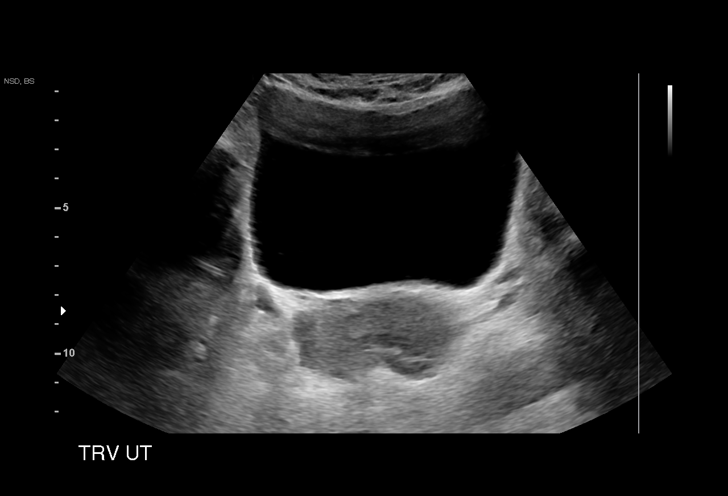
[im 13/19]
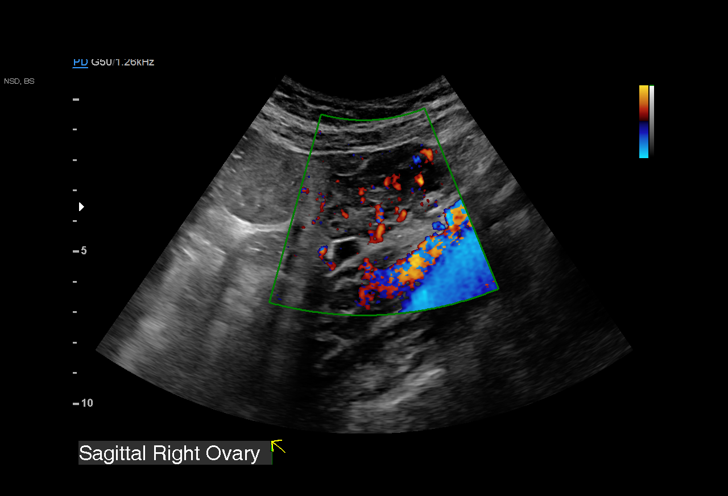
[im 14/19]
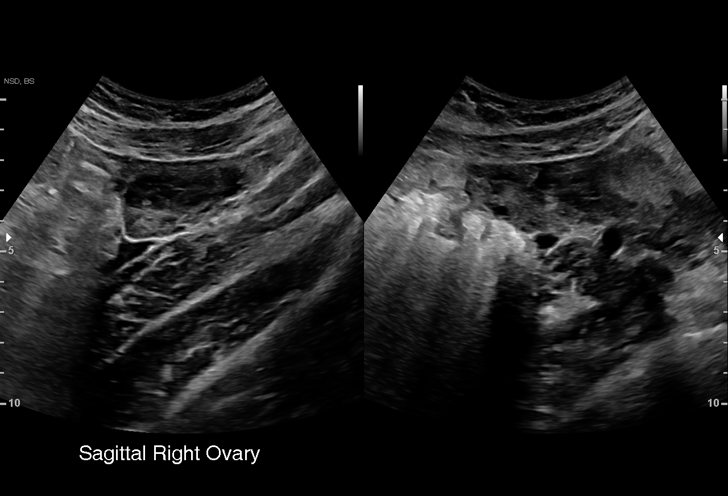
[im 15/19]
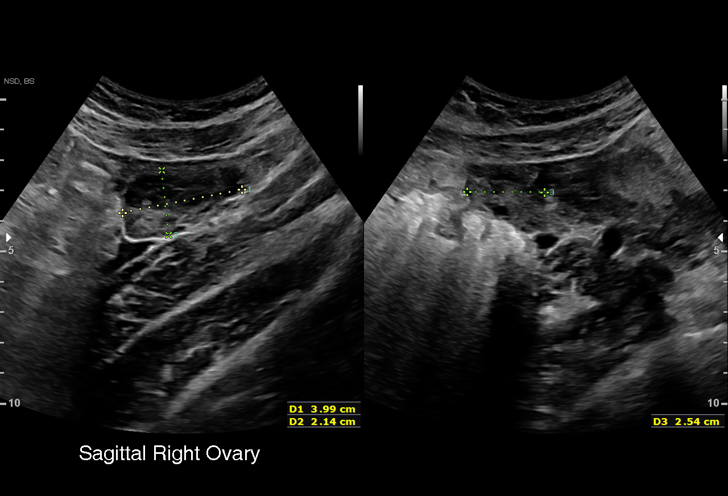
[im 16/19]
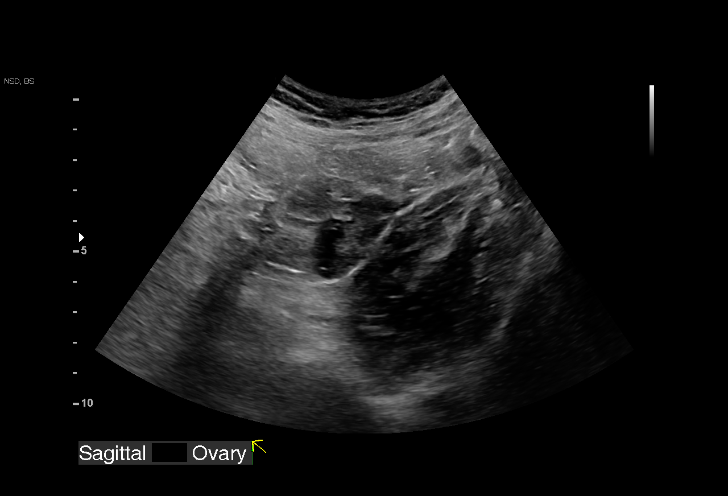
[im 18/19]
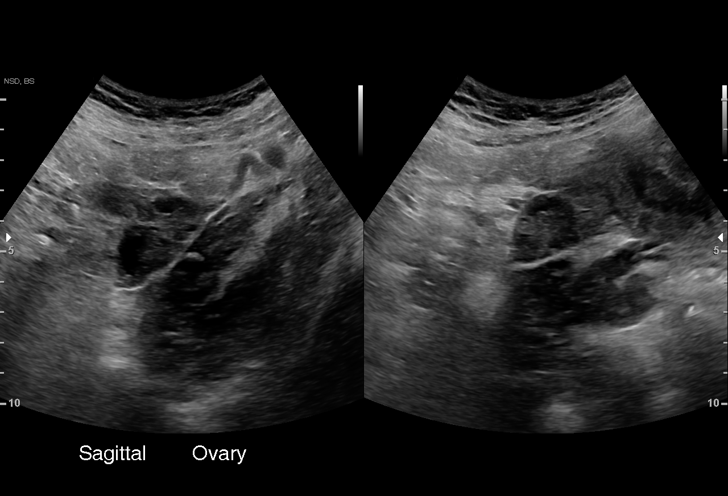
[im 19/19]
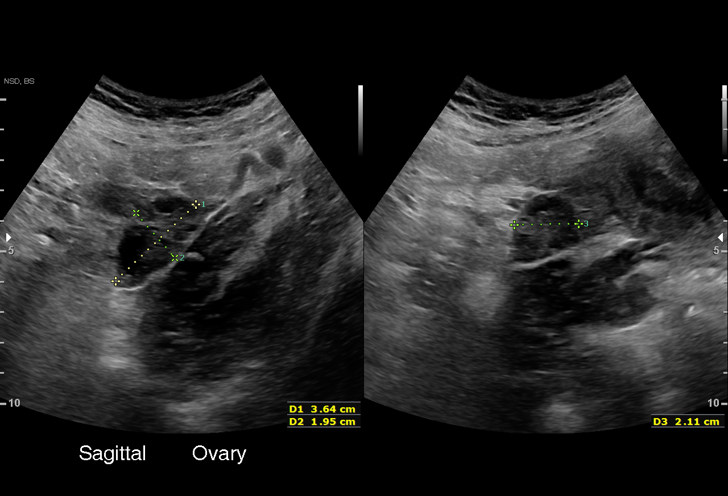

[15 of 19 positions shown; findings below may reference images not displayed]

FINDINGS: Intrauterine gestational sac: Single

Yolk sac:  Not visualized

Embryo:  Visualized

Cardiac Activity: Visualized

Heart Rate: 154 bpm

MSD:    mm    w     d

CRL:   69.8 mm   13 w 1 d                  US EDC: 04/17/2019

Subchorionic hemorrhage:  None visualized.

Maternal uterus/adnexae: No adnexal mass or free fluid
IMPRESSION: Thirteen week 1 day intrauterine pregnancy. Fetal heart rate 154
beats per minute. No acute maternal findings.

## 2020-11-16 DIAGNOSIS — Z419 Encounter for procedure for purposes other than remedying health state, unspecified: Secondary | ICD-10-CM | POA: Diagnosis not present

## 2020-11-30 DIAGNOSIS — F331 Major depressive disorder, recurrent, moderate: Secondary | ICD-10-CM | POA: Diagnosis not present

## 2020-12-14 DIAGNOSIS — G8929 Other chronic pain: Secondary | ICD-10-CM | POA: Diagnosis not present

## 2020-12-14 DIAGNOSIS — M545 Low back pain, unspecified: Secondary | ICD-10-CM | POA: Diagnosis not present

## 2020-12-14 DIAGNOSIS — M549 Dorsalgia, unspecified: Secondary | ICD-10-CM | POA: Diagnosis not present

## 2020-12-14 DIAGNOSIS — G43909 Migraine, unspecified, not intractable, without status migrainosus: Secondary | ICD-10-CM | POA: Diagnosis not present

## 2020-12-16 DIAGNOSIS — Z419 Encounter for procedure for purposes other than remedying health state, unspecified: Secondary | ICD-10-CM | POA: Diagnosis not present

## 2021-01-04 DIAGNOSIS — F331 Major depressive disorder, recurrent, moderate: Secondary | ICD-10-CM | POA: Diagnosis not present

## 2021-01-16 DIAGNOSIS — Z419 Encounter for procedure for purposes other than remedying health state, unspecified: Secondary | ICD-10-CM | POA: Diagnosis not present

## 2021-01-26 DIAGNOSIS — Z113 Encounter for screening for infections with a predominantly sexual mode of transmission: Secondary | ICD-10-CM | POA: Diagnosis not present

## 2021-01-26 DIAGNOSIS — Z7251 High risk heterosexual behavior: Secondary | ICD-10-CM | POA: Diagnosis not present

## 2021-02-08 DIAGNOSIS — M549 Dorsalgia, unspecified: Secondary | ICD-10-CM | POA: Diagnosis not present

## 2021-02-15 DIAGNOSIS — F331 Major depressive disorder, recurrent, moderate: Secondary | ICD-10-CM | POA: Diagnosis not present

## 2021-02-16 DIAGNOSIS — Z419 Encounter for procedure for purposes other than remedying health state, unspecified: Secondary | ICD-10-CM | POA: Diagnosis not present

## 2021-02-20 ENCOUNTER — Inpatient Hospital Stay (HOSPITAL_COMMUNITY): Payer: Medicaid Other

## 2021-02-20 ENCOUNTER — Inpatient Hospital Stay (HOSPITAL_COMMUNITY)
Admission: AD | Admit: 2021-02-20 | Discharge: 2021-02-20 | Disposition: A | Discharge status: Home / Self Care | Payer: Medicaid Other | Attending: Obstetrics and Gynecology | Admitting: Obstetrics and Gynecology

## 2021-02-20 ENCOUNTER — Other Ambulatory Visit: Payer: Self-pay

## 2021-02-20 ENCOUNTER — Encounter (HOSPITAL_COMMUNITY): Payer: Self-pay | Admitting: Obstetrics and Gynecology

## 2021-02-20 DIAGNOSIS — Z791 Long term (current) use of non-steroidal anti-inflammatories (NSAID): Secondary | ICD-10-CM | POA: Diagnosis not present

## 2021-02-20 DIAGNOSIS — Z3A01 Less than 8 weeks gestation of pregnancy: Secondary | ICD-10-CM | POA: Diagnosis not present

## 2021-02-20 DIAGNOSIS — O3680X0 Pregnancy with inconclusive fetal viability, not applicable or unspecified: Secondary | ICD-10-CM | POA: Diagnosis not present

## 2021-02-20 DIAGNOSIS — R1030 Lower abdominal pain, unspecified: Secondary | ICD-10-CM | POA: Insufficient documentation

## 2021-02-20 DIAGNOSIS — F17290 Nicotine dependence, other tobacco product, uncomplicated: Secondary | ICD-10-CM | POA: Insufficient documentation

## 2021-02-20 DIAGNOSIS — Z3A00 Weeks of gestation of pregnancy not specified: Secondary | ICD-10-CM | POA: Diagnosis not present

## 2021-02-20 DIAGNOSIS — O99331 Smoking (tobacco) complicating pregnancy, first trimester: Secondary | ICD-10-CM | POA: Insufficient documentation

## 2021-02-20 DIAGNOSIS — O26891 Other specified pregnancy related conditions, first trimester: Secondary | ICD-10-CM | POA: Diagnosis not present

## 2021-02-20 LAB — WET PREP, GENITAL
Clue Cells Wet Prep HPF POC: NONE SEEN
Sperm: NONE SEEN
Trich, Wet Prep: NONE SEEN
Yeast Wet Prep HPF POC: NONE SEEN

## 2021-02-20 LAB — COMPREHENSIVE METABOLIC PANEL
ALT: 17 U/L (ref 0–44)
AST: 17 U/L (ref 15–41)
Albumin: 3.9 g/dL (ref 3.5–5.0)
Alkaline Phosphatase: 39 U/L (ref 38–126)
Anion gap: 4 — ABNORMAL LOW (ref 5–15)
BUN: 9 mg/dL (ref 6–20)
CO2: 24 mmol/L (ref 22–32)
Calcium: 9.4 mg/dL (ref 8.9–10.3)
Chloride: 106 mmol/L (ref 98–111)
Creatinine, Ser: 0.77 mg/dL (ref 0.44–1.00)
GFR, Estimated: >60 mL/min (ref >60)
Glucose, Bld: 97 mg/dL (ref 70–99)
Potassium: 4.4 mmol/L (ref 3.5–5.1)
Sodium: 134 mmol/L — ABNORMAL LOW (ref 135–145)
Total Bilirubin: 0.5 mg/dL (ref 0.3–1.2)
Total Protein: 6.7 g/dL (ref 6.5–8.1)

## 2021-02-20 LAB — CBC
HCT: 34.7 % — ABNORMAL LOW (ref 36.0–46.0)
Hemoglobin: 11.6 g/dL — ABNORMAL LOW (ref 12.0–15.0)
MCH: 28.2 pg (ref 26.0–34.0)
MCHC: 33.4 g/dL (ref 30.0–36.0)
MCV: 84.2 fL (ref 80.0–100.0)
Platelets: 311 10*3/uL (ref 150–400)
RBC: 4.12 MIL/uL (ref 3.87–5.11)
RDW: 13.4 % (ref 11.5–15.5)
WBC: 7 10*3/uL (ref 4.0–10.5)
nRBC: 0 % (ref 0.0–0.2)

## 2021-02-20 LAB — URINALYSIS, ROUTINE W REFLEX MICROSCOPIC
Bilirubin Urine: NEGATIVE
Glucose, UA: NEGATIVE mg/dL
Hgb urine dipstick: NEGATIVE
Ketones, ur: NEGATIVE mg/dL
Leukocytes,Ua: NEGATIVE
Nitrite: NEGATIVE
Protein, ur: NEGATIVE mg/dL
Specific Gravity, Urine: 1.019 (ref 1.005–1.030)
pH: 7 (ref 5.0–8.0)

## 2021-02-20 LAB — POCT PREGNANCY, URINE: Preg Test, Ur: POSITIVE — AB

## 2021-02-20 LAB — HCG, QUANTITATIVE, PREGNANCY: hCG, Beta Chain, Quant, S: 1711 m[IU]/mL — ABNORMAL HIGH (ref <5)

## 2021-02-20 NOTE — MAU Note (Signed)
Stacey [name] is a 30 y.o. at [redacted]w[redacted]d here in MAU reporting: having right upper abdominal pain ongoing for the past month. Also reporting cramping and discharge. States having some itching with the discharge but no odor. No bleeding  LMP: 01/14/2021  Onset of complaint: ongoing  Pain score: 7/10  Vitals:   02/20/21 1638  BP: 120/65  Pulse: 97  Resp: 16  Temp: 98.6 F (37 C)  SpO2: 100%     Lab orders placed from triage: ua, upt

## 2021-02-20 NOTE — MAU Provider Note (Addendum)
Event Date/Time   First Provider Initiated Contact with Patient 02/20/21 1658      Chief Complaint:  Abdominal Pain and Vaginal Discharge   Stacey [name] is  30 y.o. [id number] at [redacted]w[redacted]d presents complaining of Abdominal Pain and Vaginal Discharge .  She has been having right sided pain for a few months (on the side of her rib area), awaiting a referral for [place] per Dr. Clearance Coots .  Today, she noticed an increase in her vaginal discharge with some itching.  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 outbreak   Kidney infection    kidney infection 2012   Migraine     Past Surgical History: Past Surgical History:  Procedure Laterality Date   NO PAST SURGERIES      Family History: Family History  Problem Relation Age of Onset   Hypertension Mother    Migraines Mother    Asthma Brother    Arthritis Maternal Grandmother    Cancer Maternal Grandmother    Arthritis Paternal Grandmother    Healthy Father    Anesthesia problems Neg Hx     Social History: Social History   Tobacco Use   Smoking status: Every Day    Types: Cigars    Last attempt to quit: 01/25/2019    Years since quitting: 2.0   Smokeless tobacco: Never   Tobacco comments:    black and mild-half of one every other day  Vaping Use   Vaping Use: Never used  Substance Use Topics   Alcohol use: Yes    Comment: occ   Drug use: No    Allergies: No Known Allergies  Meds:  Medications Prior to Admission  Medication Sig Dispense Refill Last Dose   brompheniramine-pseudoephedrine-DM 30-2-10 MG/5ML syrup Take 5 mLs by mouth 4  (four) times daily as needed. 120 mL 0    ibuprofen (ADVIL) 600 MG tablet Take 1 tablet (600 mg total) by mouth every 6 (six) hours. (Patient not taking: Reported on 10/21/2019) 30 tablet 0    lidocaine (LIDODERM) 5 % Place 1 patch onto the skin daily. Remove & Discard patch within 12 hours or as directed by MD 30 patch 0    methocarbamol (ROBAXIN) 500 MG tablet Take 1 tablet (500 mg total) by mouth 2 (two) times daily. 20 tablet 0    naproxen (NAPROSYN) 500 MG tablet Take 1 tablet (500 mg total) by mouth 2 (two) times daily with a meal. 30 tablet 0    predniSONE (STERAPRED UNI-PAK 21 TAB) 10 MG (21) TBPK tablet Take by mouth daily. Take as directed. 21 tablet 0    Prenat-Fe Poly-Methfol-FA-DHA (VITAFOL ULTRA) 29-0.6-0.4-200 MG CAPS Take 1 tablet by mouth daily. (Patient not taking: Reported on 10/21/2019) 30 capsule 12    Prenatal Vit-Fe Phos-FA-Omega (VITAFOL GUMMIES) 3.33-0.333-34.8 MG CHEW Chew 3 tablets by mouth daily before breakfast. (Patient not taking:  Reported on 05/11/2019) 90 tablet 11    valACYclovir (VALTREX) 500 MG tablet Take 1 tablet (500 mg total) by mouth 2 (two) times daily. (Patient not taking: Reported on 05/11/2019) 60 tablet 1     Review of Systems   Constitutional: Negative for fever and chills Eyes: Negative for visual disturbances Respiratory: Negative for shortness of breath, dyspnea Cardiovascular: Negative for chest pain or palpitations  Gastrointestinal: Negative for vomiting, diarrhea and constipation Genitourinary: Negative for dysuria and urgency Musculoskeletal: Negative for back pain, joint pain, myalgias.  Normal ROM  Neurological: Negative for dizziness and headaches    Physical Exam  Blood pressure 120/65, pulse 97, temperature 98.6 F (37 C), temperature source Oral, resp. rate 16, height 5' (1.524 m), weight 76.1 kg, last menstrual period 01/14/2021, SpO2 100 %, currently breastfeeding. GENERAL: Well-developed, well-nourished female in no acute  distress.  LUNGS: Normal respiratory effort HEART: Regular rate and rhythm. ABDOMEN: Soft,Area of pain is in mid abdomen on right/flank.  Not area of GB.  No CVAT  EXTREMITIES: Nontender, no edema, 2+ distal pulses. DTR's 2+   Labs: Results for orders placed or performed during the hospital encounter of 02/20/21 (from the past 24 hour(s))  Pregnancy, urine POC   Collection Time: 02/20/21  4:31 PM  Result Value Ref Range   Preg Test, Ur POSITIVE (A) NEGATIVE  Urinalysis, Routine w reflex microscopic Urine, Clean Catch   Collection Time: 02/20/21  4:34 PM  Result Value Ref Range   Color, Urine YELLOW YELLOW   APPearance CLEAR CLEAR   Specific Gravity, Urine 1.019 1.005 - 1.030   pH 7.0 5.0 - 8.0   Glucose, UA NEGATIVE NEGATIVE mg/dL   Hgb urine dipstick NEGATIVE NEGATIVE   Bilirubin Urine NEGATIVE NEGATIVE   Ketones, ur NEGATIVE NEGATIVE mg/dL   Protein, ur NEGATIVE NEGATIVE mg/dL   Nitrite NEGATIVE NEGATIVE   Leukocytes,Ua NEGATIVE NEGATIVE  Wet prep, genital   Collection Time: 02/20/21  5:11 PM  Result Value Ref Range   Yeast Wet Prep HPF POC NONE SEEN NONE SEEN   Trich, Wet Prep NONE SEEN NONE SEEN   Clue Cells Wet Prep HPF POC NONE SEEN NONE SEEN   WBC, Wet Prep HPF POC MODERATE (A) NONE SEEN   Sperm NONE SEEN   Comprehensive metabolic panel   Collection Time: 02/20/21  5:28 PM  Result Value Ref Range   Sodium 134 (L) 135 - 145 mmol/L   Potassium 4.4 3.5 - 5.1 mmol/L   Chloride 106 98 - 111 mmol/L   CO2 24 22 - 32 mmol/L   Glucose, Bld 97 70 - 99 mg/dL   BUN 9 6 - 20 mg/dL   Creatinine, Ser 5.80 0.44 - 1.00 mg/dL   Calcium 9.4 8.9 - 99.8 mg/dL   Total Protein 6.7 6.5 - 8.1 g/dL   Albumin 3.9 3.5 - 5.0 g/dL   AST 17 15 - 41 U/L   ALT 17 0 - 44 U/L   Alkaline Phosphatase 39 38 - 126 U/L   Total Bilirubin 0.5 0.3 - 1.2 mg/dL   GFR, Estimated >33 >82 mL/min   Anion gap 4 (L) 5 - 15  CBC   Collection Time: 02/20/21  5:28 PM  Result Value Ref Range   WBC 7.0 4.0 -  10.5 K/uL   RBC 4.12 3.87 - 5.11 MIL/uL   Hemoglobin 11.6 (L) 12.0 - 15.0 g/dL   HCT 50.5 (L) 39.7 - 67.3 %   MCV 84.2 80.0 - 100.0 fL  MCH 28.2 26.0 - 34.0 pg   MCHC 33.4 30.0 - 36.0 g/dL   RDW 62.8 31.5 - 17.6 %   Platelets 311 150 - 400 K/uL   nRBC 0.0 0.0 - 0.2 %  hCG, quantitative, pregnancy   Collection Time: 02/20/21  5:28 PM  Result Value Ref Range   hCG, Beta Chain, Quant, S 1,711 (H) <5 mIU/mL   Imaging Studies:  US OB LESS THAN 14 WEEKS WITH OB TRANSVAGINAL  Result Date: 02/20/2021 CLINICAL DATA:  Lower abdominal pain.  Positive beta HCG (1,711) EXAM: OBSTETRIC <14 WK ULTRASOUND TECHNIQUE: Transabdominal ultrasound was performed for evaluation of the gestation as well as the maternal uterus and adnexal regions. COMPARISON:  None. FINDINGS: Intrauterine gestational sac: Single Yolk sac:  Not Visualized. Embryo:  Not Visualized. Cardiac Activity: Not Visualized. MSD:  3.2 mm   5 w   0 d Subchorionic hemorrhage:  None visualized. Maternal uterus/adnexae: Within normal limits IMPRESSION: Probable early intrauterine gestational sac, but no yolk sac, fetal pole, or cardiac activity yet visualized. Recommend follow-up quantitative B-HCG levels and follow-up US in 14 days to assess viability. This recommendation follows SRU consensus guidelines: Diagnostic Criteria for Nonviable Pregnancy Early in the First Trimester. Malva Limes Med 2013; 160:7371-06. Electronically Signed   By: Maudry Mayhew M.D.   On: 02/20/2021 20:51    Assessment: Stacey [name] is  30 y.o. [id number] .  Plan: Care turned over to [facility].   Jacklyn Shell 9/5/20228:58 PM   MDM: Findings today could represent a normal early pregnancy, spontaneous abortion or ectopic pregnancy which can be life-threatening.  Ectopic precautions were given to the patient with plan to f/u at [facility] in 48 hours for repeat quant hcg to evaluate pregnancy development.  A/P: 1. Pregnancy of unknown anatomic  location   2. Lower abdominal pain     D/C home with ectopic precautions  Sharen Counter, CNM 12:26 AM

## 2021-02-21 LAB — GC/CHLAMYDIA PROBE AMP (~~LOC~~) NOT AT ARMC
Chlamydia: NEGATIVE
Comment: NEGATIVE
Comment: NORMAL
Neisseria Gonorrhea: NEGATIVE

## 2021-02-22 ENCOUNTER — Other Ambulatory Visit: Payer: Self-pay

## 2021-02-22 ENCOUNTER — Other Ambulatory Visit: Payer: Medicaid Other

## 2021-02-22 ENCOUNTER — Ambulatory Visit (INDEPENDENT_AMBULATORY_CARE_PROVIDER_SITE_OTHER): Payer: Medicaid Other

## 2021-02-22 VITALS — BP 98/67 | HR 85 | Ht 60.0 in | Wt 165.0 lb

## 2021-02-22 DIAGNOSIS — O200 Threatened abortion: Secondary | ICD-10-CM | POA: Diagnosis not present

## 2021-02-22 DIAGNOSIS — O3680X0 Pregnancy with inconclusive fetal viability, not applicable or unspecified: Secondary | ICD-10-CM | POA: Diagnosis not present

## 2021-02-22 MED ORDER — VITAFOL ULTRA 29-0.6-0.4-200 MG PO CAPS: 1.0000 | ORAL_CAPSULE | Freq: Every day | ORAL | 12 refills | Status: DC

## 2021-02-22 NOTE — Progress Notes (Addendum)
SUBJECTIVE: Stacey [name] is a 30 y.o. female who presents for repeat HCG.     OBJECTIVE: Appears well, in no apparent distress.  Vital signs are normal.   ASSESSMENT:  C/o lower abdominal pain 4/10.  Denies fever, chills, or abnormal vaginal discharge or bleeding.    PLAN: STAT HCG done, will call with results.  Call or return to clinic prn if these symptoms worsen or fail to improve as anticipated.

## 2021-02-23 ENCOUNTER — Telehealth: Payer: Self-pay

## 2021-02-23 ENCOUNTER — Other Ambulatory Visit: Payer: Self-pay

## 2021-02-23 DIAGNOSIS — O3680X0 Pregnancy with inconclusive fetal viability, not applicable or unspecified: Secondary | ICD-10-CM

## 2021-02-23 LAB — BETA HCG QUANT (REF LAB): hCG Quant: 1849 m[IU]/mL

## 2021-02-23 NOTE — Progress Notes (Signed)
BHCG was 1711 at [facility] on 02/20/21, this is not much different from 02/22/21 value of 1,849.  Please contact patient and ask about symptoms. If no symptoms, recheck in 48 hours 9/9 as a stat value...may need intervention if still plateauing given concern about possible ectopic pregnancy. Please give ectopic precautions.    Jaynie Collins, MD

## 2021-02-23 NOTE — Progress Notes (Signed)
02/23/2021  TC to patient with results. HCG did not increase by much.  Please come in for repeat STAT HCG on Friday, 02/24/21. Patient denies bleeding, pain, fever, chills.  Ectopic precautions given to patient, she verbalized understanding and agreement.

## 2021-02-23 NOTE — Telephone Encounter (Signed)
-----   Message from Tereso Newcomer, MD sent at 02/23/2021  9:01 AM EDT ----- BHCG was 1711 at [facility] on 02/20/21, this is not much different from 02/22/21 value of 1,849.  Please contact patient and ask about symptoms. If no symptoms, recheck in 48 hours 9/9 as a stat value...may need intervention if still plateauing given concern about possible ectopic pregnancy. Please give ectopic precautions.    Stacey Collins, MD

## 2021-02-23 NOTE — Telephone Encounter (Signed)
Pt notified of results and appt made for repeat STAT HCG 02/24/21.  Ectopic precautions given. Pt verbalized understanding and agreement.

## 2021-02-24 ENCOUNTER — Ambulatory Visit (INDEPENDENT_AMBULATORY_CARE_PROVIDER_SITE_OTHER): Payer: Medicaid Other

## 2021-02-24 ENCOUNTER — Other Ambulatory Visit: Payer: Medicaid Other

## 2021-02-24 ENCOUNTER — Other Ambulatory Visit: Payer: Self-pay

## 2021-02-24 ENCOUNTER — Ambulatory Visit: Payer: Medicaid Other

## 2021-02-24 ENCOUNTER — Encounter: Payer: Self-pay | Admitting: Obstetrics and Gynecology

## 2021-02-24 DIAGNOSIS — O3680X0 Pregnancy with inconclusive fetal viability, not applicable or unspecified: Secondary | ICD-10-CM | POA: Diagnosis not present

## 2021-02-24 DIAGNOSIS — O200 Threatened abortion: Secondary | ICD-10-CM | POA: Diagnosis not present

## 2021-02-24 LAB — BETA HCG QUANT (REF LAB): hCG Quant: 3046 m[IU]/mL

## 2021-02-24 NOTE — Progress Notes (Signed)
12:noon TC to patient she was advised of results. Precautions given and [place] scheduled for 1 week. Patient verbalized understanding.

## 2021-02-24 NOTE — Progress Notes (Signed)
SUBJECTIVE: Stacey [name] is a 30 y.o. female who presents for STAT HCG.  OBJECTIVE: Appears well, in no apparent distress.  Vital signs are normal. Urine dipstick shows not done.    ASSESSMENT: Denies bleeding, pain, fever  PLAN: STAT HCG done, will call with results. Call or return to clinic prn if these symptoms worsen or fail to improve as anticipated.

## 2021-02-24 NOTE — Addendum Note (Signed)
Addended by: Maretta Bees on: 02/24/2021 12:05 PM   Modules accepted: Orders

## 2021-02-25 ENCOUNTER — Inpatient Hospital Stay (HOSPITAL_COMMUNITY): Payer: Medicaid Other

## 2021-02-25 ENCOUNTER — Inpatient Hospital Stay (HOSPITAL_COMMUNITY)
Admission: AD | Admit: 2021-02-25 | Discharge: 2021-02-25 | Disposition: A | Discharge status: Home / Self Care | Payer: Medicaid Other | Attending: Family Medicine | Admitting: Family Medicine

## 2021-02-25 ENCOUNTER — Other Ambulatory Visit: Payer: Self-pay

## 2021-02-25 DIAGNOSIS — Z3A01 Less than 8 weeks gestation of pregnancy: Secondary | ICD-10-CM | POA: Diagnosis not present

## 2021-02-25 DIAGNOSIS — O26899 Other specified pregnancy related conditions, unspecified trimester: Secondary | ICD-10-CM | POA: Diagnosis not present

## 2021-02-25 DIAGNOSIS — R1030 Lower abdominal pain, unspecified: Secondary | ICD-10-CM | POA: Insufficient documentation

## 2021-02-25 DIAGNOSIS — Z3A00 Weeks of gestation of pregnancy not specified: Secondary | ICD-10-CM | POA: Diagnosis not present

## 2021-02-25 DIAGNOSIS — O99331 Smoking (tobacco) complicating pregnancy, first trimester: Secondary | ICD-10-CM | POA: Diagnosis not present

## 2021-02-25 DIAGNOSIS — F17290 Nicotine dependence, other tobacco product, uncomplicated: Secondary | ICD-10-CM | POA: Diagnosis not present

## 2021-02-25 DIAGNOSIS — R109 Unspecified abdominal pain: Secondary | ICD-10-CM | POA: Diagnosis not present

## 2021-02-25 DIAGNOSIS — O26891 Other specified pregnancy related conditions, first trimester: Secondary | ICD-10-CM

## 2021-02-25 LAB — HCG, QUANTITATIVE, PREGNANCY: hCG, Beta Chain, Quant, S: 6208 m[IU]/mL — ABNORMAL HIGH (ref <5)

## 2021-02-25 NOTE — MAU Note (Signed)
Abd pain left lower side since 8 am.  No bleeding. Got an ultrasound recently but was too early to see and monitoring quants.

## 2021-02-25 NOTE — MAU Provider Note (Signed)
Chief Complaint:  No chief complaint on file.   None    HPI: Stacey [name] is a 30 y.o. [phone] at [redacted]w[redacted]d who presents to maternity admissions reporting lower abdominal pain. Patient reports that this morning she started having left sided sharp, intermittent lower abdominal pain. She was able to go to work, but came in after she got off as the pain had not resolved. Nothing makes the pain better or worse. She has not tried anything to relieve the pain. She denies vaginal bleeding, discharge, urinary s/s, constipation or diarrhea.    Pregnancy Course:   Past Medical History:  Diagnosis Date   Alpha thalassemia silent carrier 10/11/2018   [x]  Genetic counseling [ ] FOB testing   GERD (gastroesophageal reflux disease)    HSV-2 infection    No hx of outbreak   Kidney infection    kidney infection 2012   Migraine    OB History  Gravida Para Term Preterm AB Living  7 3 3  0 3 3  SAB IAB Ectopic Multiple Live Births  0 2 0 0 3    # Outcome Date GA Lbr Len/2nd Weight Sex Delivery Anes PTL Lv  7 Current           6 Term 04/13/19 [redacted]w[redacted]d 19:37 / 00:18 3980 g M Vag-Spont EPI  LIV  5 Term 05/10/17 [redacted]w[redacted]d 06:06 / 00:17 3189 g M Vag-Spont EPI  LIV  4 AB 2017          3 IAB 10/30/13             Birth Comments: System Generated. Please review and update pregnancy details.  2 Term 11/02/10 [redacted]w[redacted]d  3260 g F Vag-Spont EPI  LIV     Birth Comments: post partum pre eclampsia  1 IAB              Birth Comments: System Generated. Please review and update pregnancy details.   Past Surgical History:  Procedure Laterality Date   NO PAST SURGERIES     Family History  Problem Relation Age of Onset   Hypertension Mother    Migraines Mother    Asthma Brother    Arthritis Maternal Grandmother    Cancer Maternal Grandmother    Arthritis Paternal Grandmother    Healthy Father    Anesthesia problems Neg Hx    Social History   Tobacco Use   Smoking status: Every Day    Types: Cigars    Last attempt to  quit: 01/25/2019    Years since quitting: 2.0   Smokeless tobacco: Never   Tobacco comments:    black and mild-half of one every other day  Vaping Use   Vaping Use: Never used  Substance Use Topics   Alcohol use: Yes    Comment: occ   Drug use: No   No Known Allergies Medications Prior to Admission  Medication Sig Dispense Refill Last Dose   Prenat-Fe Poly-Methfol-FA-DHA (VITAFOL ULTRA) 29-0.6-0.4-200 MG CAPS Take 1 tablet by mouth daily. 30 capsule 12    Prenatal Vit-Fe Phos-FA-Omega (VITAFOL GUMMIES) 3.33-0.333-34.8 MG CHEW Chew 3 tablets by mouth daily before breakfast. (Patient not taking: Reported on 05/11/2019) 90 tablet 11    valACYclovir (VALTREX) 500 MG tablet Take 1 tablet (500 mg total) by mouth 2 (two) times daily. (Patient not taking: Reported on 05/11/2019) 60 tablet 1    I have reviewed patient's Past Medical Hx, Surgical Hx, Family Hx, Social Hx, medications and allergies.   ROS:  Review of  Systems  Constitutional: Negative.   Respiratory: Negative.    Cardiovascular: Negative.   Gastrointestinal:  Positive for abdominal pain.  Genitourinary: Negative.   All other systems reviewed and are negative.  Physical Exam  Patient Vitals for the past 24 hrs:  BP Pulse Resp SpO2 Height Weight  02/25/21 1933 112/76 (!) 101 16 100 % 5' (1.524 m) 75.3 kg   Constitutional: well-developed, well-nourished female in no acute distress.  Cardiovascular: normal rate Respiratory: normal effort GI: abd soft, non-tender MS: Extremities nontender, no edema, normal ROM Neurologic: Alert and oriented x 4.   Labs: Results for orders placed or performed during the hospital encounter of 02/25/21 (from the past 24 hour(s))  hCG, quantitative, pregnancy     Status: Abnormal   Collection Time: 02/25/21  8:05 PM  Result Value Ref Range   hCG, Beta Chain, Quant, S 6,208 (H) <5 mIU/mL    Imaging:  US OB Transvaginal  Addendum Date: 02/25/2021   ADDENDUM REPORT: 02/25/2021 22:05  ADDENDUM: Correction to findings. Under embryo section, findings should read not visualized. Impression is correct, there is visualized gestational sac and yolk sac but no embryo. Electronically Signed   By: Jasmine Pang M.D.   On: 02/25/2021 22:05   Result Date: 02/25/2021 CLINICAL DATA:  Left abdominal pain EXAM: TRANSVAGINAL OB ULTRASOUND TECHNIQUE: Transvaginal ultrasound was performed for complete evaluation of the gestation as well as the maternal uterus, adnexal regions, and pelvic cul-de-sac. COMPARISON:  02/20/2021 FINDINGS: Intrauterine gestational sac: Single intrauterine gestational sac Yolk sac:  Visualized Embryo:  Visualized MSD: 7.4 mm   5 w   3 d Subchorionic hemorrhage: Possible small subchorionic hemorrhage along the inferior sac. Maternal uterus/adnexae: Ovaries are within normal limits. The left ovary measures 2 x 3.6 x 1.7 cm. The right ovary measures 2.6 x 2.6 by 3.8 cm. Small free fluid in the pelvis IMPRESSION: 1. Single intrauterine pregnancy with visible gestational sac and yolk sac but no embryo. Suggest sonographic follow-up in 10-14 days to confirm viability. 2. Small free fluid in the pelvis. Possible small subchorionic hemorrhage. Electronically Signed: By: Jasmine Pang M.D. On: 02/25/2021 21:30     MAU Course: Orders Placed This Encounter  Procedures   US OB Transvaginal   hCG, quantitative, pregnancy   Discharge patient   No orders of the defined types were placed in this encounter.   MDM: Hcg rose from 3000 to 6200 [place] with results as above. Gestational sac with definitive yolk sac. No embryo. Small subchorionic hematoma. Patient has follow up US scheduled on 9/19. Patient to keep appointment.     Assessment: 1. Abdominal pain affecting pregnancy     Plan: Discharge home in stable condition.  Follow up as scheduled on 9/19 for ultrasound.  Schedule [facility] at [facility].  Return to MAU as needed.    Allergies as of 02/25/2021   No Known  Allergies      Medication List     TAKE these medications    valACYclovir 500 MG tablet Commonly known as: Valtrex Take 1 tablet (500 mg total) by mouth 2 (two) times daily.   Vitafol Gummies 3.33-0.333-34.8 MG Chew Chew 3 tablets by mouth daily before breakfast.   Vitafol Ultra 29-0.6-0.4-200 MG Caps Take 1 tablet by mouth daily.         Camelia Eng, MSN, CNM 02/25/2021 10:19 PM

## 2021-02-27 DIAGNOSIS — F331 Major depressive disorder, recurrent, moderate: Secondary | ICD-10-CM | POA: Diagnosis not present

## 2021-02-27 NOTE — Progress Notes (Signed)
Attestation of Attending Supervision of clinical support staff: I agree with the care provided to this patient and was available for any consultation.  I have reviewed the CMA's note and chart, and I agree with the management and plan.  Chantay Whitelock MD MPH, ABFM Attending Physician Faculty Practice- [facility]  

## 2021-03-02 DIAGNOSIS — F331 Major depressive disorder, recurrent, moderate: Secondary | ICD-10-CM | POA: Diagnosis not present

## 2021-03-06 ENCOUNTER — Other Ambulatory Visit: Payer: Self-pay

## 2021-03-06 ENCOUNTER — Ambulatory Visit
Admission: RE | Admit: 2021-03-06 | Discharge: 2021-03-06 | Disposition: A | Discharge status: Home / Self Care | Payer: Medicaid Other | Source: Ambulatory Visit | Attending: Medical | Admitting: Medical

## 2021-03-06 DIAGNOSIS — O3680X0 Pregnancy with inconclusive fetal viability, not applicable or unspecified: Secondary | ICD-10-CM | POA: Insufficient documentation

## 2021-03-06 DIAGNOSIS — Z3A01 Less than 8 weeks gestation of pregnancy: Secondary | ICD-10-CM | POA: Diagnosis not present

## 2021-03-08 ENCOUNTER — Ambulatory Visit (INDEPENDENT_AMBULATORY_CARE_PROVIDER_SITE_OTHER): Payer: Medicaid Other

## 2021-03-08 ENCOUNTER — Other Ambulatory Visit (HOSPITAL_COMMUNITY)
Admission: RE | Admit: 2021-03-08 | Discharge: 2021-03-08 | Disposition: A | Discharge status: Home / Self Care | Payer: Medicaid Other | Source: Ambulatory Visit | Attending: Obstetrics and Gynecology | Admitting: Obstetrics and Gynecology

## 2021-03-08 ENCOUNTER — Other Ambulatory Visit: Payer: Self-pay

## 2021-03-08 DIAGNOSIS — N898 Other specified noninflammatory disorders of vagina: Secondary | ICD-10-CM | POA: Insufficient documentation

## 2021-03-08 NOTE — Progress Notes (Signed)
SUBJECTIVE:  30 y.o. female complains of white, creamy, and thick vaginal discharge and vaginal itching x 3 days. Denies abnormal vaginal bleeding or significant pelvic pain or fever. No UTI symptoms. Denies history of known exposure to STD.  Patient's last menstrual period was 01/14/2021.  OBJECTIVE:  She appears well, afebrile. Urine dipstick: not done.  ASSESSMENT:  Vaginal Discharge  Vaginal itching   PLAN:  GC, chlamydia, trichomonas, BVAG, CVAG probe sent to lab. Treatment: To be determined once lab results are reviewed by the provider ROV prn if symptoms persist or worsen.

## 2021-03-08 NOTE — Progress Notes (Signed)
Agreed with A & P

## 2021-03-09 LAB — CERVICOVAGINAL ANCILLARY ONLY
Bacterial Vaginitis (gardnerella): NEGATIVE
Candida Glabrata: NEGATIVE
Candida Vaginitis: POSITIVE — AB
Chlamydia: NEGATIVE
Comment: NEGATIVE
Comment: NEGATIVE
Comment: NEGATIVE
Comment: NEGATIVE
Comment: NEGATIVE
Comment: NORMAL
Neisseria Gonorrhea: NEGATIVE
Trichomonas: NEGATIVE

## 2021-03-10 ENCOUNTER — Other Ambulatory Visit: Payer: Self-pay

## 2021-03-10 DIAGNOSIS — N898 Other specified noninflammatory disorders of vagina: Secondary | ICD-10-CM

## 2021-03-10 MED ORDER — TERCONAZOLE 0.4 % VA CREA: 1.0000 | TOPICAL_CREAM | Freq: Every day | VAGINAL | 0 refills | Status: DC

## 2021-03-13 ENCOUNTER — Telehealth: Payer: Self-pay | Admitting: *Deleted

## 2021-03-13 ENCOUNTER — Other Ambulatory Visit: Payer: Self-pay | Admitting: *Deleted

## 2021-03-13 ENCOUNTER — Encounter: Payer: Self-pay | Admitting: *Deleted

## 2021-03-13 DIAGNOSIS — O219 Vomiting of pregnancy, unspecified: Secondary | ICD-10-CM

## 2021-03-13 MED ORDER — PROMETHAZINE HCL 25 MG PO TABS: 25.0000 mg | ORAL_TABLET | Freq: Four times a day (QID) | ORAL | 0 refills | Status: DC | PRN

## 2021-03-13 NOTE — Telephone Encounter (Signed)
Returned TC to patient regarding complaint of nausea and heartburn in early pregnancy. Notified of RX sent for Phenergan and of message in MyChart with further education on safe meds in pregnancy, morning sickness and recommendations for nausea in early pregnancy. Patient verbalized understanding.

## 2021-03-18 DIAGNOSIS — Z419 Encounter for procedure for purposes other than remedying health state, unspecified: Secondary | ICD-10-CM | POA: Diagnosis not present

## 2021-03-27 ENCOUNTER — Telehealth: Payer: Self-pay | Admitting: *Deleted

## 2021-03-27 NOTE — Telephone Encounter (Signed)
Returned TC to patient regarding nausea. No answer. Left VM for patient to call the office.

## 2021-04-03 ENCOUNTER — Ambulatory Visit (INDEPENDENT_AMBULATORY_CARE_PROVIDER_SITE_OTHER): Payer: Medicaid Other

## 2021-04-03 DIAGNOSIS — Z3480 Encounter for supervision of other normal pregnancy, unspecified trimester: Secondary | ICD-10-CM

## 2021-04-03 MED ORDER — BLOOD PRESSURE KIT DEVI: 1.0000 | 0 refills | Status: DC | PRN

## 2021-04-03 MED ORDER — GOJJI WEIGHT SCALE MISC: 1.0000 | 0 refills | Status: DC | PRN

## 2021-04-03 NOTE — Progress Notes (Signed)
..  New OB Intake  I connected with  Stacey [name] on 04/03/21 at  2:00 PM EDT by telephone Video Visit and verified that I am speaking with the correct person using two identifiers. Nurse is located at [facility] and pt is located at work.  I discussed the limitations, risks, security and privacy concerns of performing an evaluation and management service by telephone and the availability of in person appointments. I also discussed with the patient that there may be a patient responsible charge related to this service. The patient expressed understanding and agreed to proceed.  I explained I am completing New OB Intake today. We discussed her EDD of 10/26/2020 that is based on U/S of 03/06/21. Pt is G7/P3. I reviewed her allergies, medications, Medical/Surgical/OB history, and appropriate screenings. I informed her of [facility] services. Based on history, this is a/an  pregnancy uncomplicated .   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   MyChart/Babyscripts MyChart access verified. I explained pt will have some visits in office and some virtually. Babyscripts instructions given and order placed. Patient verifies receipt of registration text/e-mail. Account successfully created and app downloaded.  Blood Pressure Cuff   Blood pressure cuff ordered for patient to pick-up from [employer]. Explained after first prenatal appt pt will check weekly and document in Babyscripts.  Weight scale: Patient    have weight scale. Weight scale ordered for patient to pick up form [facility].   Anatomy US Explained first scheduled [place] will be around 19 weeks. Anatomy US appointment to be determined.  Labs Discussed Avelina Laine genetic screening with patient. Would like both Panorama and Horizon drawn at new OB visit. Routine prenatal labs needed.  Covid Vaccine Patient has not covid vaccine.   Mother/ Baby Dyad Candidate?    If yes, offer as possibility  Informed patient of [facility] website  and placed link in her AVS.   Social Determinants of Health Food Insecurity: Patient denies food insecurity. WIC Referral: Patient is not interested in referral to [facility].  Transportation: Patient denies transportation needs. Childcare: Discussed no children allowed at ultrasound appointments. Offered childcare services; patient declines childcare services at this time.  Send link to Pregnancy Navigators   Placed OB Box on problem list and updated  First visit review I reviewed new OB appt with pt. I explained she will have a pelvic exam, ob bloodwork with genetic screening, and PAP smear. Explained pt will be seen by Nettie Elm, MD at first visit; encounter routed to appropriate provider. Explained that patient will be seen by pregnancy navigator following visit with provider. [facility] information placed in AVS.   Katrina Stack, RN 04/03/2021  2:35 PM

## 2021-04-04 NOTE — Progress Notes (Signed)
Patient was assessed and managed by nursing staff during this encounter. I have reviewed the chart and agree with the documentation and plan. I have also made any necessary editorial changes.  Elizaveta Mattice, MD 04/04/2021 11:45 AM   

## 2021-04-10 ENCOUNTER — Encounter: Payer: Medicaid Other | Admitting: Obstetrics and Gynecology

## 2021-04-17 ENCOUNTER — Other Ambulatory Visit: Payer: Self-pay

## 2021-04-17 ENCOUNTER — Other Ambulatory Visit (HOSPITAL_COMMUNITY)
Admission: RE | Admit: 2021-04-17 | Discharge: 2021-04-17 | Disposition: A | Discharge status: Home / Self Care | Payer: Medicaid Other | Source: Ambulatory Visit | Attending: Obstetrics and Gynecology | Admitting: Obstetrics and Gynecology

## 2021-04-17 ENCOUNTER — Ambulatory Visit (INDEPENDENT_AMBULATORY_CARE_PROVIDER_SITE_OTHER): Payer: Medicaid Other

## 2021-04-17 VITALS — BP 120/71 | HR 129 | Wt 172.0 lb

## 2021-04-17 DIAGNOSIS — Z3480 Encounter for supervision of other normal pregnancy, unspecified trimester: Secondary | ICD-10-CM | POA: Diagnosis not present

## 2021-04-17 DIAGNOSIS — Z113 Encounter for screening for infections with a predominantly sexual mode of transmission: Secondary | ICD-10-CM | POA: Diagnosis not present

## 2021-04-17 DIAGNOSIS — O09299 Supervision of pregnancy with other poor reproductive or obstetric history, unspecified trimester: Secondary | ICD-10-CM | POA: Diagnosis not present

## 2021-04-17 DIAGNOSIS — Z3A12 12 weeks gestation of pregnancy: Secondary | ICD-10-CM | POA: Diagnosis not present

## 2021-04-17 DIAGNOSIS — O219 Vomiting of pregnancy, unspecified: Secondary | ICD-10-CM

## 2021-04-17 DIAGNOSIS — Z8759 Personal history of other complications of pregnancy, childbirth and the puerperium: Secondary | ICD-10-CM

## 2021-04-17 MED ORDER — FAMOTIDINE 20 MG PO TABS: 20.0000 mg | ORAL_TABLET | Freq: Two times a day (BID) | ORAL | 3 refills | Status: DC

## 2021-04-17 MED ORDER — METOCLOPRAMIDE HCL 10 MG PO TABS: 10.0000 mg | ORAL_TABLET | Freq: Four times a day (QID) | ORAL | 3 refills | Status: DC

## 2021-04-17 MED ORDER — GLYCOPYRROLATE 1 MG PO TABS: 1.0000 mg | ORAL_TABLET | Freq: Three times a day (TID) | ORAL | 0 refills | Status: DC

## 2021-04-17 NOTE — Progress Notes (Signed)
Subjective:   Stacey [name] is a 30 y.o. [phone] at 58w4dby early ultrasound being seen today for her first obstetrical visit.  Her obstetrical history is significant for obesity and has Major depressive disorder, recurrent, severe without psychotic features ([place]; Hx of preeclampsia, prior pregnancy, currently pregnant, third trimester; Herpes simplex vulvovaginitis; Abnormal chromosomal and genetic finding on antenatal screening mother; Alpha thalassemia silent carrier; Tobacco use during pregnancy; Chronic migraine; History of shoulder dystocia in prior pregnancy; Shoulder dystocia during labor and delivery, delivered; and Supervision of other normal pregnancy, antepartum on their problem list.. Patient does intend to breast feed. Pregnancy history fully reviewed.  Patient reports heartburn, nausea, and vomiting. Takes phenergan without relief. She also endorses a lot of heartburn and spitting. No other concerns.   HISTORY: OB History  Gravida Para Term Preterm AB Living  7 3 3  0 3 3  SAB IAB Ectopic Multiple Live Births  0 2 0 0 3    # Outcome Date GA Lbr Len/2nd Weight Sex Delivery Anes PTL Lv  7 Current           6 Term 04/13/19 327w6d9:37 / 00:18 8 lb 12.4 oz (3.98 kg) M Vag-Spont EPI  LIV     Name: Karn,BOY Lenzie     Apgar1: 5  Apgar5: 9  5 Term 05/10/17 4165w1d:06 / 00:17 7 lb 0.5 oz (3.189 kg) M Vag-Spont EPI  LIV     Name: LYNTAYONA, SARNOWSKI  Apgar1: 7  Apgar5: 9  4 AB 2017          3 IAB 10/30/13             Birth Comments: System Generated. Please review and update pregnancy details.  2 Term 11/02/10 40w78w0dlb 3 oz (3.26 kg) F Vag-Spont EPI  LIV     Birth Comments: post partum pre eclampsia  1 IAB              Birth Comments: System Generated. Please review and update pregnancy details.   Past Medical History:  Diagnosis Date   Alpha thalassemia silent carrier 10/11/2018   [x]  Genetic counseling [ ] FOB testing   GERD (gastroesophageal reflux disease)     HSV-2 infection    No hx of outbreak   Kidney infection    kidney infection 2012   Migraine    Past Surgical History:  Procedure Laterality Date   NO PAST SURGERIES     Family History  Problem Relation Age of Onset   Hypertension Mother    Migraines Mother    Asthma Brother    Arthritis Maternal Grandmother    Cancer Maternal Grandmother    Arthritis Paternal Grandmother    Healthy Father    Anesthesia problems Neg Hx    Social History   Tobacco Use   Smoking status: Every Day    Types: Cigars    Last attempt to quit: 01/25/2019    Years since quitting: 2.2   Smokeless tobacco: Never   Tobacco comments:    black and mild-half of one every other day  Vaping Use   Vaping Use: Never used  Substance Use Topics   Alcohol use: Yes    Comment: occ   Drug use: No   No Known Allergies Current Outpatient Medications on File Prior to Visit  Medication Sig Dispense Refill   promethazine (PHENERGAN) 25 MG tablet Take 1 tablet (25 mg total) by mouth every 6 (six)  hours as needed for nausea or vomiting. (Patient not taking: Reported on 04/03/2021) 30 tablet 0   terconazole (TERAZOL 7) 0.4 % vaginal cream Place 1 applicator vaginally at bedtime. (Patient not taking: Reported on 04/03/2021) 45 g 0   Blood Pressure Monitoring (BLOOD PRESSURE KIT) DEVI 1 kit by Does not apply route as needed. 1 each 0   Misc. Devices (GOJJI WEIGHT SCALE) MISC 1 Device by Does not apply route as needed. 1 each 0   Prenat-Fe Poly-Methfol-FA-DHA (VITAFOL ULTRA) 29-0.6-0.4-200 MG CAPS Take 1 tablet by mouth daily. 30 capsule 12   Prenatal Vit-Fe Phos-FA-Omega (VITAFOL GUMMIES) 3.33-0.333-34.8 MG CHEW Chew 3 tablets by mouth daily before breakfast. (Patient not taking: Reported on 05/11/2019) 90 tablet 11   valACYclovir (VALTREX) 500 MG tablet Take 1 tablet (500 mg total) by mouth 2 (two) times daily. (Patient not taking: Reported on 05/11/2019) 60 tablet 1   No current facility-administered medications on  file prior to visit.   Indications for ASA therapy (per uptodate) One of the following: Previous pregnancy with preeclampsia, especially early onset and with an adverse outcome Yes Multifetal gestation No Chronic hypertension No Type 1 or 2 diabetes mellitus No Chronic kidney disease No Autoimmune disease (antiphospholipid syndrome, systemic lupus erythematosus) No  Two or more of the following: Nulliparity No Obesity (body mass index >30 kg/m2) Yes Family history of preeclampsia in mother or sister No Age ?35 years No Sociodemographic characteristics (African American race, low socioeconomic level) Yes Personal risk factors (eg, previous pregnancy with low birth weight or small for gestational age infant, previous adverse pregnancy outcome [eg, stillbirth], interval >10 years between pregnancies) No  Indications for early 1 hour GTT (per uptodate)  BMI >25 (>23 in Asian women) AND one of the following  Gestational diabetes mellitus in a previous pregnancy No Glycated hemoglobin ?5.7 percent (39 mmol/mol), impaired glucose tolerance, or impaired fasting glucose on previous testing No First-degree relative with diabetes No High-risk race/ethnicity (eg, African American, Latino, Native American, [place] American, Pacific Islander) Yes History of cardiovascular disease No Hypertension or on therapy for hypertension No High-density lipoprotein cholesterol level <35 mg/dL (0.90 mmol/L) and/or a triglyceride level >250 mg/dL (2.82 mmol/L) No Polycystic ovary syndrome No Physical inactivity No Other clinical condition associated with insulin resistance (eg, severe obesity, acanthosis nigricans) No Previous birth of an infant weighing ?4000 g No Previous stillbirth of unknown cause No Exam   Vitals:   04/17/21 1617  BP: 120/71  Pulse: (!) 129  Weight: 172 lb (78 kg)   Fetal Heart Rate (bpm): 159  Uterus:     Pelvic Exam: Perineum: no hemorrhoids, normal perineum   Vulva: normal  external genitalia, no lesions   Vagina:  normal mucosa, normal discharge   Cervix: no lesions and normal, pap smear done.    Adnexa: normal adnexa and no mass, fullness, tenderness   Bony Pelvis: average  System: General: well-developed, well-nourished female in no acute distress   Breast:  normal appearance, no masses or tenderness   Skin: normal coloration and turgor, no rashes   Neurologic: oriented, normal, negative, normal mood   Extremities: normal strength, tone, and muscle mass, ROM of all joints is normal   HEENT PERRLA, extraocular movement intact and sclera clear, anicteric   Mouth/Teeth mucous membranes moist, pharynx normal without lesions and dental hygiene good   Neck supple and no masses   Cardiovascular: regular rate and rhythm   Respiratory:  no respiratory distress, normal breath sounds   Abdomen: soft,  non-tender; bowel sounds normal; no masses,  no organomegaly     Assessment:   Pregnancy: [id number] Patient Active Problem List   Diagnosis Date Noted   Supervision of other normal pregnancy, antepartum 04/03/2021   Shoulder dystocia during labor and delivery, delivered 04/13/2019   History of shoulder dystocia in prior pregnancy 04/12/2019   Chronic migraine 03/02/2019   Tobacco use during pregnancy 01/22/2019   Abnormal chromosomal and genetic finding on antenatal screening mother 10/11/2018   Alpha thalassemia silent carrier 10/11/2018   Hx of preeclampsia, prior pregnancy, currently pregnant, third trimester 11/09/2016   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   Major depressive disorder, recurrent, severe without psychotic features ([place])      Plan:  1. Supervision of other normal pregnancy, antepartum - Routine NOB  - Cytology - PAP( [place]) - Culture, OB Urine - Obstetric Panel, Including HIV - Hepatitis C Antibody - Genetic Screening  2. History of pre-eclampsia in prior pregnancy, currently pregnant - Hx PP pre-e after G1 - Patient to  start bASA - Baseline labs - BP normotensive  - Comp Met (CMET) - Protein / creatinine ratio, urine  3. Nausea and vomiting during pregnancy prior to [redacted] weeks gestation - Patient vomiting during today's visit - Will send new medications to pharmacy  - metoCLOPramide (REGLAN) 10 MG tablet; Take 1 tablet (10 mg total) by mouth 4 (four) times daily.  Dispense: 60 tablet; Refill: 3 - glycopyrrolate (ROBINUL) 1 MG tablet; Take 1 tablet (1 mg total) by mouth 3 (three) times daily.  Dispense: 90 tablet; Refill: 0 - famotidine (PEPCID) 20 MG tablet; Take 1 tablet (20 mg total) by mouth 2 (two) times daily.  Dispense: 60 tablet; Refill: 3  4. History of shoulder dystocia in prior pregnancy - Shoulder dystocia x2 - G2 and G3 7lbs and 8lbs 12 oz  5. History of migraines - Stable, not currently on medications   Initial labs drawn. Continue prenatal vitamins. Discussed and offered genetic screening options, including Quad screen/AFP, NIPS testing, and option to decline testing. Benefits/risks/alternatives reviewed. Pt aware that anatomy US is form of genetic screening with lower accuracy in detecting trisomies than blood work.  Pt chooses/declines genetic screening today. NIPS: requested. Ultrasound discussed; fetal anatomic survey: requested. Problem list reviewed and updated. The nature of [place] with multiple MDs and other Advanced Practice Providers was explained to patient; also emphasized that residents, students are part of our team. Routine obstetric precautions reviewed. Return in about 4 weeks (around 05/15/2021).   Renee Harder, CNM 04/17/21 4:59 PM

## 2021-04-17 NOTE — Progress Notes (Signed)
NEW OB c/o NV medication is not working, unable to keep anything down, numbness in hands, feet.

## 2021-04-18 DIAGNOSIS — Z419 Encounter for procedure for purposes other than remedying health state, unspecified: Secondary | ICD-10-CM | POA: Diagnosis not present

## 2021-04-18 LAB — OBSTETRIC PANEL, INCLUDING HIV
Antibody Screen: NEGATIVE
Basophils Absolute: 0 10*3/uL (ref 0.0–0.2)
Basos: 0 %
EOS (ABSOLUTE): 0 10*3/uL (ref 0.0–0.4)
Eos: 0 %
HIV Screen 4th Generation wRfx: NONREACTIVE
Hematocrit: 37.5 % (ref 34.0–46.6)
Hemoglobin: 12.9 g/dL (ref 11.1–15.9)
Hepatitis B Surface Ag: NEGATIVE
Immature Grans (Abs): 0.1 10*3/uL (ref 0.0–0.1)
Immature Granulocytes: 1 %
Lymphocytes Absolute: 0.7 10*3/uL (ref 0.7–3.1)
Lymphs: 8 %
MCH: 27.4 pg (ref 26.6–33.0)
MCHC: 34.4 g/dL (ref 31.5–35.7)
MCV: 80 fL (ref 79–97)
Monocytes Absolute: 0.4 10*3/uL (ref 0.1–0.9)
Monocytes: 4 %
Neutrophils Absolute: 8 10*3/uL — ABNORMAL HIGH (ref 1.4–7.0)
Neutrophils: 87 %
Platelets: 376 10*3/uL (ref 150–450)
RBC: 4.7 x10E6/uL (ref 3.77–5.28)
RDW: 12.4 % (ref 11.7–15.4)
RPR Ser Ql: NONREACTIVE
Rh Factor: POSITIVE
Rubella Antibodies, IGG: 2.38 index (ref >0.99)
WBC: 9.2 10*3/uL (ref 3.4–10.8)

## 2021-04-18 LAB — COMPREHENSIVE METABOLIC PANEL
ALT: 19 IU/L (ref 0–32)
AST: 19 IU/L (ref 0–40)
Albumin/Globulin Ratio: 1.8 (ref 1.2–2.2)
Albumin: 4.6 g/dL (ref 3.9–5.0)
Alkaline Phosphatase: 63 IU/L (ref 44–121)
BUN/Creatinine Ratio: 9 (ref 9–23)
BUN: 5 mg/dL — ABNORMAL LOW (ref 6–20)
Bilirubin Total: 0.4 mg/dL (ref 0.0–1.2)
CO2: 19 mmol/L — ABNORMAL LOW (ref 20–29)
Calcium: 9.5 mg/dL (ref 8.7–10.2)
Chloride: 103 mmol/L (ref 96–106)
Creatinine, Ser: 0.55 mg/dL — ABNORMAL LOW (ref 0.57–1.00)
Globulin, Total: 2.6 g/dL (ref 1.5–4.5)
Glucose: 90 mg/dL (ref 70–99)
Potassium: 4.1 mmol/L (ref 3.5–5.2)
Sodium: 136 mmol/L (ref 134–144)
Total Protein: 7.2 g/dL (ref 6.0–8.5)
eGFR: 127 mL/min/{1.73_m2} (ref >59)

## 2021-04-18 LAB — HEPATITIS C ANTIBODY: Hep C Virus Ab: <0.1 s/co ratio (ref 0.0–0.9)

## 2021-04-18 LAB — PROTEIN / CREATININE RATIO, URINE
Creatinine, Urine: 295.8 mg/dL
Protein, Ur: 65.5 mg/dL
Protein/Creat Ratio: 221 mg/g creat — ABNORMAL HIGH (ref 0–200)

## 2021-04-20 LAB — CULTURE, OB URINE

## 2021-04-20 LAB — CYTOLOGY - PAP
Comment: NEGATIVE
Diagnosis: NEGATIVE
Diagnosis: REACTIVE
High risk HPV: NEGATIVE

## 2021-04-20 LAB — URINE CULTURE, OB REFLEX

## 2021-04-24 ENCOUNTER — Other Ambulatory Visit: Payer: Self-pay

## 2021-04-24 ENCOUNTER — Encounter (HOSPITAL_COMMUNITY): Payer: Self-pay | Admitting: Emergency Medicine

## 2021-04-24 ENCOUNTER — Ambulatory Visit (HOSPITAL_COMMUNITY)
Admission: EM | Admit: 2021-04-24 | Discharge: 2021-04-24 | Disposition: A | Discharge status: Home / Self Care | Payer: Medicaid Other | Attending: Internal Medicine | Admitting: Internal Medicine

## 2021-04-24 DIAGNOSIS — J101 Influenza due to other identified influenza virus with other respiratory manifestations: Secondary | ICD-10-CM | POA: Diagnosis not present

## 2021-04-24 LAB — POC INFLUENZA A AND B ANTIGEN (URGENT CARE ONLY)
INFLUENZA A ANTIGEN, POC: POSITIVE — AB
INFLUENZA B ANTIGEN, POC: NEGATIVE

## 2021-04-24 MED ORDER — OSELTAMIVIR PHOSPHATE 75 MG PO CAPS: 75.0000 mg | ORAL_CAPSULE | Freq: Two times a day (BID) | ORAL | 0 refills | Status: DC

## 2021-04-24 NOTE — ED Triage Notes (Signed)
Pt c/o headache, body aches, and cough x 2 days.

## 2021-04-24 NOTE — Discharge Instructions (Addendum)
Increase oral fluid intake Tylenol as needed for fever Humidifier use is recommended Vapor rub to help with nasal congestion Return to urgent care if symptoms worsen.

## 2021-04-25 ENCOUNTER — Encounter: Payer: Self-pay | Admitting: Obstetrics and Gynecology

## 2021-04-25 NOTE — ED Provider Notes (Signed)
[place]    CSN: [id number] Arrival date & time: 04/24/21  1928      History   Chief Complaint Chief Complaint  Patient presents with   Generalized Body Aches   Cough    HPI Stacey [name] is a 30 y.o. female comes to the urgent care with a 2-day history of subjective fever, headache, body aches and a nonproductive cough.  No nausea, vomiting or diarrhea.  Patient's children have similar symptoms.  No loss of appetite.  No shortness of breath, wheezing or chest tightness.  No abdominal pain.  No dizziness, near syncope or syncopal episodes.Marland Kitchen   HPI  Past Medical History:  Diagnosis Date   Alpha thalassemia silent carrier 10/11/2018   _0  Genetic counseling _1 FOB testing   GERD (gastroesophageal reflux disease)    HSV-2 infection    No hx of outbreak   Kidney infection    kidney infection 2012   Migraine     Patient Active Problem List   Diagnosis Date Noted   Supervision of other normal pregnancy, antepartum 04/03/2021   Shoulder dystocia during labor and delivery, delivered 04/13/2019   History of shoulder dystocia in prior pregnancy 04/12/2019   Chronic migraine 03/02/2019   Tobacco use during pregnancy 01/22/2019   Abnormal chromosomal and genetic finding on antenatal screening mother 10/11/2018   Alpha thalassemia silent carrier 10/11/2018   Hx of preeclampsia, prior pregnancy, currently pregnant, third trimester 11/09/2016   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   Major depressive disorder, recurrent, severe without psychotic features ([place])     Past Surgical History:  Procedure Laterality Date   NO PAST SURGERIES      OB History     Gravida  7   Para  3   Term  3   Preterm  0   AB  3   Living  3      SAB  0   IAB  2   Ectopic  0   Multiple  0   Live Births  3            Home Medications    Prior to Admission medications   Medication Sig Start Date End Date Taking? Authorizing Provider  oseltamivir (TAMIFLU)  75 MG capsule Take 1 capsule (75 mg total) by mouth every 12 (twelve) hours. 04/24/21  Yes Iyesha Such, Myrene Galas, MD  promethazine (PHENERGAN) 25 MG tablet Take 1 tablet (25 mg total) by mouth every 6 (six) hours as needed for nausea or vomiting. Patient not taking: Reported on 04/03/2021 03/13/21   Constant, Peggy, MD  terconazole (TERAZOL 7) 0.4 % vaginal cream Place 1 applicator vaginally at bedtime. Patient not taking: Reported on 04/03/2021 03/10/21   Chancy Milroy, MD  Blood Pressure Monitoring (BLOOD PRESSURE KIT) DEVI 1 kit by Does not apply route as needed. 04/03/21   Constant, Peggy, MD  famotidine (PEPCID) 20 MG tablet Take 1 tablet (20 mg total) by mouth 2 (two) times daily. 04/17/21   Renee Harder, CNM  glycopyrrolate (ROBINUL) 1 MG tablet Take 1 tablet (1 mg total) by mouth 3 (three) times daily. 04/17/21   Renee Harder, CNM  metoCLOPramide (REGLAN) 10 MG tablet Take 1 tablet (10 mg total) by mouth 4 (four) times daily. 04/17/21   Renee Harder, CNM  Misc. Devices (GOJJI WEIGHT SCALE) MISC 1 Device by Does not apply route as needed. 04/03/21   Constant, Peggy, MD  Prenat-Fe Poly-Methfol-FA-DHA (VITAFOL ULTRA) 29-0.6-0.4-200 MG CAPS Take 1  tablet by mouth daily. 02/22/21   Leftwich-Kirby, Kathie Dike, CNM  Prenatal Vit-Fe Phos-FA-Omega (VITAFOL GUMMIES) 3.33-0.333-34.8 MG CHEW Chew 3 tablets by mouth daily before breakfast. Patient not taking: Reported on 05/11/2019 09/03/18   Shelly Bombard, MD  valACYclovir (VALTREX) 500 MG tablet Take 1 tablet (500 mg total) by mouth 2 (two) times daily. Patient not taking: Reported on 05/11/2019 03/24/19   Gavin Pound, CNM    Family History Family History  Problem Relation Age of Onset   Hypertension Mother    Migraines Mother    Asthma Brother    Arthritis Maternal Grandmother    Cancer Maternal Grandmother    Arthritis Paternal Grandmother    Healthy Father    Anesthesia problems Neg Hx     Social History Social History    Tobacco Use   Smoking status: Every Day    Types: Cigars    Last attempt to quit: 01/25/2019    Years since quitting: 2.2   Smokeless tobacco: Never   Tobacco comments:    black and mild-half of one every other day  Vaping Use   Vaping Use: Never used  Substance Use Topics   Alcohol use: Yes    Comment: occ   Drug use: No     Allergies   Patient has no known allergies.   Review of Systems Review of Systems  Constitutional: Negative.   HENT:  Positive for congestion, rhinorrhea and sore throat.   Respiratory:  Positive for cough.   Gastrointestinal: Negative.   Musculoskeletal:  Positive for myalgias.    Physical Exam Triage Vital Signs ED Triage Vitals  Enc Vitals Group     BP 04/24/21 2054 108/73     Pulse Rate 04/24/21 2054 (!) 107     Resp 04/24/21 2054 16     Temp --      Temp src --      SpO2 04/24/21 2054 98 %     Weight 04/24/21 2055 172 lb (78 kg)     Height --      Head Circumference --      Peak Flow --      Pain Score 04/24/21 2055 0     Pain Loc --      Pain Edu? --      Excl. in [place]? --    No data found.  Updated Vital Signs BP 108/73   Pulse (!) 107   Resp 16   Wt 78 kg   LMP 01/14/2021   SpO2 98%   BMI 33.59 kg/m   Visual Acuity Right Eye Distance:   Left Eye Distance:   Bilateral Distance:    Right Eye Near:   Left Eye Near:    Bilateral Near:     Physical Exam Vitals and nursing note reviewed.  Constitutional:      General: She is not in acute distress.    Appearance: She is not ill-appearing.  HENT:     Right Ear: Tympanic membrane normal.     Left Ear: Tympanic membrane normal.     Mouth/Throat:     Pharynx: Posterior oropharyngeal erythema present.  Eyes:     Extraocular Movements: Extraocular movements intact.     Pupils: Pupils are equal, round, and reactive to light.  Cardiovascular:     Rate and Rhythm: Normal rate and regular rhythm.     Pulses: Normal pulses.     Heart sounds: Normal heart sounds.   Pulmonary:     Effort: Pulmonary  effort is normal.     Breath sounds: Normal breath sounds.  Abdominal:     General: Bowel sounds are normal.     Palpations: Abdomen is soft.  Neurological:     Mental Status: She is alert.     UC Treatments / Results  Labs (all labs ordered are listed, but only abnormal results are displayed) Labs Reviewed  POC INFLUENZA A AND B ANTIGEN (URGENT CARE ONLY) - Abnormal; Notable for the following components:      Result Value   INFLUENZA A ANTIGEN, POC POSITIVE (*)    All other components within normal limits    EKG   Radiology No results found.  Procedures Procedures (including critical care time)  Medications Ordered in UC Medications - No data to display  Initial Impression / Assessment and Plan / UC Course  I have reviewed the triage vital signs and the nursing notes.  Pertinent labs & imaging results that were available during my care of the patient were reviewed by me and considered in my medical decision making (see chart for details).     1.  Influenza A infection: Tylenol as needed for fever and/or body aches Rapid flu test is positive Increase oral fluid intake If symptoms worsen please return to the urgent care to be reevaluated Tamiflu twice daily for 5 days Return to urgent care if symptoms worsen.  Final Clinical Impressions(s) / UC Diagnoses   Final diagnoses:  Influenza A     Discharge Instructions      Increase oral fluid intake Alternate Tylenol with Motrin as needed for fever Humidifier use is recommended Vapor rub to help with nasal congestion Return to urgent care if symptoms worsen.   ED Prescriptions     Medication Sig Dispense Auth. Provider   oseltamivir (TAMIFLU) 75 MG capsule Take 1 capsule (75 mg total) by mouth every 12 (twelve) hours. 10 capsule Lea Baine, Myrene Galas, MD      PDMP not reviewed this encounter.   Chase Picket, MD 04/25/21 1209

## 2021-04-28 DIAGNOSIS — Z3400 Encounter for supervision of normal first pregnancy, unspecified trimester: Secondary | ICD-10-CM | POA: Diagnosis not present

## 2021-05-05 ENCOUNTER — Encounter: Payer: Self-pay | Admitting: Obstetrics and Gynecology

## 2021-05-09 ENCOUNTER — Encounter (HOSPITAL_COMMUNITY): Payer: Self-pay | Admitting: Obstetrics & Gynecology

## 2021-05-09 ENCOUNTER — Other Ambulatory Visit: Payer: Self-pay

## 2021-05-09 ENCOUNTER — Inpatient Hospital Stay (HOSPITAL_COMMUNITY)
Admission: AD | Admit: 2021-05-09 | Discharge: 2021-05-09 | Disposition: A | Discharge status: Home / Self Care | Payer: Medicaid Other | Attending: Obstetrics & Gynecology | Admitting: Obstetrics & Gynecology

## 2021-05-09 DIAGNOSIS — R102 Pelvic and perineal pain: Secondary | ICD-10-CM | POA: Insufficient documentation

## 2021-05-09 DIAGNOSIS — N949 Unspecified condition associated with female genital organs and menstrual cycle: Secondary | ICD-10-CM

## 2021-05-09 DIAGNOSIS — O26892 Other specified pregnancy related conditions, second trimester: Secondary | ICD-10-CM | POA: Insufficient documentation

## 2021-05-09 DIAGNOSIS — Z3492 Encounter for supervision of normal pregnancy, unspecified, second trimester: Secondary | ICD-10-CM

## 2021-05-09 DIAGNOSIS — Z3A15 15 weeks gestation of pregnancy: Secondary | ICD-10-CM

## 2021-05-09 DIAGNOSIS — N898 Other specified noninflammatory disorders of vagina: Secondary | ICD-10-CM

## 2021-05-09 LAB — URINALYSIS, ROUTINE W REFLEX MICROSCOPIC
Bilirubin Urine: NEGATIVE
Glucose, UA: NEGATIVE mg/dL
Hgb urine dipstick: NEGATIVE
Ketones, ur: 5 mg/dL — AB
Leukocytes,Ua: NEGATIVE
Nitrite: NEGATIVE
Protein, ur: NEGATIVE mg/dL
Specific Gravity, Urine: 1.019 (ref 1.005–1.030)
pH: 5 (ref 5.0–8.0)

## 2021-05-09 LAB — WET PREP, GENITAL
Clue Cells Wet Prep HPF POC: NONE SEEN
Sperm: NONE SEEN
Trich, Wet Prep: NONE SEEN
WBC, Wet Prep HPF POC: >=10 — AB (ref <10)
Yeast Wet Prep HPF POC: NONE SEEN

## 2021-05-09 NOTE — Discharge Instructions (Signed)

## 2021-05-09 NOTE — MAU Note (Signed)
Having some cramping at the bottom of her stomach,  9started today)and some d/c.  2 days ago it was brownish, now it is white, no odor.

## 2021-05-09 NOTE — MAU Provider Note (Signed)
Event Date/Time   First Provider Initiated Contact with Patient 05/09/21 1731     S [name] is a 30 y.o. [phone] pregnant female at [redacted]w[redacted]d who presents to MAU today with complaint of vaginal discharge and lower left abdominal cramping. Noted brown discharge two days ago, is white today. No brown or bleeding today. Has noted some pain on the lower left side which she thought was gas but felt more like cramping today, is worst with movement. Not as bad right now. Last IC two days before brown discharge noted. No other physical complaints.  Receives care at [facility]. Was following HCG in the early first trimester, also noted a small [facility] on her second ultrasound (02/25/21) which was gone by her third on 03/06/21.   Pertinent items noted in HPI and remainder of comprehensive ROS otherwise negative.   O BP 109/66 (BP Location: Right Arm)   Pulse 92   Temp 98.2 F (36.8 C) (Oral)   Resp 17   Ht 5' (1.524 m)   Wt 175 lb 3.2 oz (79.5 kg)   LMP 01/14/2021   SpO2 100%   BMI 34.22 kg/m  Physical Exam Vitals and nursing note reviewed.  Constitutional:      General: She is not in acute distress.    Appearance: Normal appearance. She is not ill-appearing.  HENT:     Head: Normocephalic.  Eyes:     Pupils: Pupils are equal, round, and reactive to light.  Cardiovascular:     Rate and Rhythm: Normal rate and regular rhythm.     Pulses: Normal pulses.  Pulmonary:     Effort: Pulmonary effort is normal.  Abdominal:     General: There is no distension.     Palpations: Abdomen is soft.     Tenderness: There is no abdominal tenderness.  Genitourinary:    General: Normal vulva.     Comments: Self-swab collected, pelvic exam deferred since no brown discharge in two days. Musculoskeletal:        General: Normal range of motion.  Skin:    General: Skin is warm.     Capillary Refill: Capillary refill takes less than 2 seconds.  Neurological:     Mental Status: She is alert and  oriented to person, place, and time.  Psychiatric:        Mood and Affect: Mood normal.        Behavior: Behavior normal.        Thought Content: Thought content normal.        Judgment: Judgment normal.   FHR: 145  Wet prep normal, explained possibility of post-coital bleeding causing the brown discharge. Gave reassurance re: presence of fetal heart tones and absence of ongoing bleeding. Discussed round ligament pains and gave info in AVS on how to stretch/massage for relief of pain. Pt expressed understanding.  A Round ligament pain in second trimester Fetal heart tones present Medical screening exam complete  P Discharge from MAU in stable condition with second trimester precautions Follow up at [facility] as scheduled for ongoing prenatal care. Patient may return to MAU as needed for emergent OB related complaints  Bernerd Limbo, CNM 05/09/2021 6:18 PM

## 2021-05-10 LAB — GC/CHLAMYDIA PROBE AMP (~~LOC~~) NOT AT ARMC
Chlamydia: NEGATIVE
Comment: NEGATIVE
Comment: NORMAL
Neisseria Gonorrhea: NEGATIVE

## 2021-05-15 ENCOUNTER — Other Ambulatory Visit: Payer: Self-pay

## 2021-05-15 ENCOUNTER — Ambulatory Visit (INDEPENDENT_AMBULATORY_CARE_PROVIDER_SITE_OTHER): Payer: Medicaid Other | Admitting: Obstetrics and Gynecology

## 2021-05-15 VITALS — BP 103/69 | HR 101 | Wt 177.0 lb

## 2021-05-15 DIAGNOSIS — Z3480 Encounter for supervision of other normal pregnancy, unspecified trimester: Secondary | ICD-10-CM

## 2021-05-15 DIAGNOSIS — A6004 Herpesviral vulvovaginitis: Secondary | ICD-10-CM

## 2021-05-15 DIAGNOSIS — O09293 Supervision of pregnancy with other poor reproductive or obstetric history, third trimester: Secondary | ICD-10-CM

## 2021-05-15 DIAGNOSIS — Z8759 Personal history of other complications of pregnancy, childbirth and the puerperium: Secondary | ICD-10-CM

## 2021-05-15 MED ORDER — ASPIRIN EC 81 MG PO TBEC: 81.0000 mg | DELAYED_RELEASE_TABLET | Freq: Every day | ORAL | 11 refills | Status: DC

## 2021-05-15 NOTE — Progress Notes (Signed)
   PRENATAL VISIT NOTE  Subjective:  Stacey [name] is a 30 y.o. [phone] at [redacted]w[redacted]d being seen today for ongoing prenatal care.  She is currently monitored for the following issues for this high-risk pregnancy and has Major depressive disorder, recurrent, severe without psychotic features (HCC); Hx of preeclampsia, prior pregnancy, currently pregnant, third trimester; Herpes simplex vulvovaginitis; Alpha thalassemia silent carrier; Chronic migraine; History of shoulder dystocia in prior pregnancy; and Supervision of other normal pregnancy, antepartum on their problem list.  Patient reports  cramping - she had recent MAU visit. She had negative Urine/vag/GC/CT cultures .  Contractions: Not present. Vag. Bleeding: None.   . Denies leaking of fluid.   The following portions of the patient's history were reviewed and updated as appropriate: allergies, current medications, past family history, past medical history, past social history, past surgical history and problem list.   Objective:   Vitals:   05/15/21 1602  BP: 103/69  Pulse: (!) 101  Weight: 177 lb (80.3 kg)    Fetal Status: Fetal Heart Rate (bpm): 150         General:  Alert, oriented and cooperative. Patient is in no acute distress.  Skin: Skin is warm and dry. No rash noted.   Cardiovascular: Normal heart rate noted  Respiratory: Normal respiratory effort, no problems with respiration noted  Abdomen: Soft, gravid, appropriate for gestational age.  Pain/Pressure: Present     Pelvic: Cervical exam deferred        Extremities: Normal range of motion.     Mental Status: Normal mood and affect. Normal behavior. Normal judgment and thought content.   Assessment and Plan:  Pregnancy: [id number] at [redacted]w[redacted]d 1. Hx of preeclampsia, prior pregnancy, currently pregnant, third trimester - Will start ldASA - Only affected G1  2. Supervision of other normal pregnancy, antepartum - Schedule anatomy US  3. History of shoulder dystocia in prior  pregnancy - Reviewed risk of recurrence and risk of sequelae. Discussed risk of permanent nerve injury. Discussed low risk of this but that given her hisotry, she would have the option for 1LTCS. She declines this at this time  4. Herpes simplex vulvovaginitis - Will need prophy at 36w.   Preterm labor symptoms and general obstetric precautions including but not limited to vaginal bleeding, contractions, leaking of fluid and fetal movement were reviewed in detail with the patient. Please refer to After Visit Summary for other counseling recommendations.   Return in about 4 weeks (around 06/12/2021) for OB VISIT, MD or APP.  No future appointments.  Milas Hock, MD

## 2021-05-18 DIAGNOSIS — Z419 Encounter for procedure for purposes other than remedying health state, unspecified: Secondary | ICD-10-CM | POA: Diagnosis not present

## 2021-06-07 ENCOUNTER — Other Ambulatory Visit: Payer: Self-pay

## 2021-06-07 ENCOUNTER — Ambulatory Visit: Payer: Medicaid Other | Attending: Obstetrics and Gynecology

## 2021-06-07 ENCOUNTER — Ambulatory Visit (HOSPITAL_BASED_OUTPATIENT_CLINIC_OR_DEPARTMENT_OTHER): Payer: Medicaid Other | Admitting: Maternal & Fetal Medicine

## 2021-06-07 DIAGNOSIS — Z3480 Encounter for supervision of other normal pregnancy, unspecified trimester: Secondary | ICD-10-CM

## 2021-06-07 DIAGNOSIS — Z363 Encounter for antenatal screening for malformations: Secondary | ICD-10-CM | POA: Insufficient documentation

## 2021-06-07 DIAGNOSIS — Z3689 Encounter for other specified antenatal screening: Secondary | ICD-10-CM | POA: Diagnosis not present

## 2021-06-07 DIAGNOSIS — Z3A19 19 weeks gestation of pregnancy: Secondary | ICD-10-CM | POA: Diagnosis not present

## 2021-06-07 DIAGNOSIS — Z148 Genetic carrier of other disease: Secondary | ICD-10-CM | POA: Insufficient documentation

## 2021-06-07 DIAGNOSIS — O283 Abnormal ultrasonic finding on antenatal screening of mother: Secondary | ICD-10-CM | POA: Diagnosis not present

## 2021-06-07 NOTE — Progress Notes (Signed)
MFM Brief Note  Stacey [name] is a 30 yo G7P3 who is here at 19w 6d at the request of Dr. Milas Hock for a complete anatomy.  She is dated by an early 6wk 4d ultrasound with an EDD of 10/26/21.   A single intrauterine pregnancy here for a detailed anatomy due to an echogenic intracardiac focus. Normal anatomy with measurements consistent with dates There is good fetal movement and amniotic fluid volume Suboptimal views of the fetal anatomy were obtained secondary to fetal position.  An echogenic intracardiac focus was observed today. I discussed that there is no increased risk to the function or structure of the heart. In addition, in the context of a low risk NIPS result the risk for aneuploidy are reduced. There were no additional markers of aneuploidy observed.  I reviewed that an ultrasound is a screening exam and that a diagnostic exam via amnioticenetesis is the only test available to provide a definitive result.  No further testing is recommended at this time.  Give the suboptimal views of the fetal anatomy I have scheduled Ms. Bokhari to return in 4 weeks.   All questions answered  I spent 20 minutes with >50% in face to face consultation.  Novella Olive, MD

## 2021-06-08 ENCOUNTER — Other Ambulatory Visit: Payer: Self-pay | Admitting: *Deleted

## 2021-06-08 DIAGNOSIS — Z362 Encounter for other antenatal screening follow-up: Secondary | ICD-10-CM

## 2021-06-14 ENCOUNTER — Ambulatory Visit (INDEPENDENT_AMBULATORY_CARE_PROVIDER_SITE_OTHER): Payer: Medicaid Other | Admitting: Nurse Practitioner

## 2021-06-14 ENCOUNTER — Other Ambulatory Visit: Payer: Self-pay

## 2021-06-14 VITALS — BP 112/70 | HR 107 | Wt 177.0 lb

## 2021-06-14 DIAGNOSIS — F332 Major depressive disorder, recurrent severe without psychotic features: Secondary | ICD-10-CM

## 2021-06-14 DIAGNOSIS — Z3480 Encounter for supervision of other normal pregnancy, unspecified trimester: Secondary | ICD-10-CM | POA: Diagnosis not present

## 2021-06-14 DIAGNOSIS — Z3A20 20 weeks gestation of pregnancy: Secondary | ICD-10-CM

## 2021-06-14 DIAGNOSIS — Z8759 Personal history of other complications of pregnancy, childbirth and the puerperium: Secondary | ICD-10-CM

## 2021-06-14 DIAGNOSIS — O09293 Supervision of pregnancy with other poor reproductive or obstetric history, third trimester: Secondary | ICD-10-CM

## 2021-06-14 DIAGNOSIS — A6004 Herpesviral vulvovaginitis: Secondary | ICD-10-CM

## 2021-06-14 NOTE — Progress Notes (Signed)
° ° °  Subjective:  Stacey [name] is a 30 y.o. [phone] at [redacted]w[redacted]d being seen today for ongoing prenatal care.  She is currently monitored for the following issues for this high-risk pregnancy and has Major depressive disorder, recurrent, severe without psychotic features (HCC); Hx of preeclampsia, prior pregnancy, currently pregnant, third trimester; Herpes simplex vulvovaginitis; Alpha thalassemia silent carrier; Chronic migraine; History of shoulder dystocia in prior pregnancy; and Supervision of other normal pregnancy, antepartum on their problem list.  Patient reports no complaints.  Contractions: Not present. Vag. Bleeding: None.  Movement: Present. Denies leaking of fluid.   The following portions of the patient's history were reviewed and updated as appropriate: allergies, current medications, past family history, past medical history, past social history, past surgical history and problem list. Problem list updated.  Objective:   Vitals:   06/14/21 1559  BP: 112/70  Pulse: (!) 107  Weight: 177 lb (80.3 kg)    Fetal Status: Fetal Heart Rate (bpm): 148 Fundal Height: 20 cm Movement: Present     General:  Alert, oriented and cooperative. Patient is in no acute distress.  Skin: Skin is warm and dry. No rash noted.   Cardiovascular: Normal heart rate noted  Respiratory: Normal respiratory effort, no problems with respiration noted  Abdomen: Soft, gravid, appropriate for gestational age. Pain/Pressure: Absent     Pelvic:  Cervical exam deferred        Extremities: Normal range of motion.  Edema: None  Mental Status: Normal mood and affect. Normal behavior. Normal judgment and thought content.   Urinalysis:      Assessment and Plan:  Pregnancy: [id number] at [redacted]w[redacted]d  1. Supervision of other normal pregnancy, antepartum Doing well Has not picked up low dose aspirin but plans to start this week Is not having headaches often - when they occur, they resolve with fluids and rest Has not had  problems with depression in several years.  Previous pregnancies have not had problems with depression during pregnancy or postpartum.  - AFP, Serum, Open Spina Bifida  2. Hx of preeclampsia, prior pregnancy, currently pregnant, third trimester BP normal today  3. History of shoulder dystocia in prior pregnancy   4. Herpes simplex vulvovaginitis Will treat with prophylaxis at 36 weeks  5. [redacted] weeks gestation of pregnancy   Preterm labor symptoms and general obstetric precautions including but not limited to vaginal bleeding, contractions, leaking of fluid and fetal movement were reviewed in detail with the patient. Please refer to After Visit Summary for other counseling recommendations.  Return in about 4 weeks (around 07/12/2021) for [facility] in person.  Nolene Bernheim, RN, MSN, NP-BC Nurse Practitioner, [facility] for [employer], [facility] 06/14/2021 9:22 PM

## 2021-06-16 ENCOUNTER — Encounter (HOSPITAL_COMMUNITY): Payer: Self-pay

## 2021-06-16 ENCOUNTER — Other Ambulatory Visit: Payer: Self-pay

## 2021-06-16 ENCOUNTER — Ambulatory Visit (HOSPITAL_COMMUNITY)
Admission: EM | Admit: 2021-06-16 | Discharge: 2021-06-16 | Disposition: A | Discharge status: Home / Self Care | Payer: Medicaid Other | Attending: Internal Medicine | Admitting: Internal Medicine

## 2021-06-16 DIAGNOSIS — J111 Influenza due to unidentified influenza virus with other respiratory manifestations: Secondary | ICD-10-CM | POA: Diagnosis not present

## 2021-06-16 LAB — AFP, SERUM, OPEN SPINA BIFIDA
AFP MoM: 1.09
AFP Value: 68.9 ng/mL
Gest. Age on Collection Date: 20.6 weeks
Maternal Age At EDD: 29.9 yr
OSBR Risk 1 IN: 10000
Test Results:: NEGATIVE
Weight: 177 [lb_av]

## 2021-06-16 LAB — POC INFLUENZA A AND B ANTIGEN (URGENT CARE ONLY)
INFLUENZA A ANTIGEN, POC: POSITIVE — AB
INFLUENZA B ANTIGEN, POC: NEGATIVE

## 2021-06-16 MED ORDER — OSELTAMIVIR PHOSPHATE 75 MG PO CAPS: 75.0000 mg | ORAL_CAPSULE | Freq: Two times a day (BID) | ORAL | 0 refills | Status: DC

## 2021-06-16 NOTE — ED Notes (Signed)
Pt called to give results. Pt aware rx has been sent to pharmacy.

## 2021-06-16 NOTE — ED Provider Notes (Addendum)
[place]    CSN: [id number] Arrival date & time: 06/16/21  1257      History   Chief Complaint Chief Complaint  Patient presents with   Cough   Generalized Body Aches    HPI Stacey [name] is a 30 y.o. female comes to the urgent care with a 3-day history of generalized body aches, nonproductive cough and nasal congestion.  Patient's symptoms have been persistent over the past 3 days.  She has had some nausea without vomiting and also had an episode of nonbloody nonmucoid diarrhea.  No abdominal pain or distention.  Patient is currently [redacted] weeks pregnant.  She denies any fever or chills.  Patient's son has similar symptoms.  HPI  Past Medical History:  Diagnosis Date   Alpha thalassemia silent carrier 10/11/2018   _0  Genetic counseling _1 FOB testing   GERD (gastroesophageal reflux disease)    HSV-2 infection    No hx of outbreak   Kidney infection    kidney infection 2012   Migraine     Patient Active Problem List   Diagnosis Date Noted   Supervision of other normal pregnancy, antepartum 04/03/2021   History of shoulder dystocia in prior pregnancy 04/12/2019   Chronic migraine 03/02/2019   Alpha thalassemia silent carrier 10/11/2018   Hx of preeclampsia, prior pregnancy, currently pregnant, third trimester 11/09/2016   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   Major depressive disorder, recurrent, severe without psychotic features ([place])     Past Surgical History:  Procedure Laterality Date   NO PAST SURGERIES      OB History     Gravida  7   Para  3   Term  3   Preterm  0   AB  3   Living  3      SAB  0   IAB  2   Ectopic  0   Multiple  0   Live Births  3            Home Medications    Prior to Admission medications   Medication Sig Start Date End Date Taking? Authorizing Provider  Blood Pressure Monitoring (BLOOD PRESSURE KIT) DEVI 1 kit by Does not apply route as needed. 04/03/21   Constant, Peggy, MD  famotidine  (PEPCID) 20 MG tablet Take 1 tablet (20 mg total) by mouth 2 (two) times daily. 04/17/21   Renee Harder, CNM  metoCLOPramide (REGLAN) 10 MG tablet Take 1 tablet (10 mg total) by mouth 4 (four) times daily. 04/17/21   Renee Harder, CNM  Misc. Devices (GOJJI WEIGHT SCALE) MISC 1 Device by Does not apply route as needed. 04/03/21   Constant, Peggy, MD  Prenat-Fe Poly-Methfol-FA-DHA (VITAFOL ULTRA) 29-0.6-0.4-200 MG CAPS Take 1 tablet by mouth daily. 02/22/21   Leftwich-Kirby, Kathie Dike, CNM    Family History Family History  Problem Relation Age of Onset   Hypertension Mother    Migraines Mother    Asthma Brother    Arthritis Maternal Grandmother    Cancer Maternal Grandmother    Arthritis Paternal Grandmother    Healthy Father    Anesthesia problems Neg Hx     Social History Social History   Tobacco Use   Smoking status: Every Day    Types: Cigars    Last attempt to quit: 01/25/2019    Years since quitting: 2.3   Smokeless tobacco: Never   Tobacco comments:    black and mild-half of one every other day  Vaping  Use   Vaping Use: Never used  Substance Use Topics   Alcohol use: Yes    Comment: occ   Drug use: No     Allergies   Patient has no known allergies.   Review of Systems Review of Systems  Constitutional: Negative.  Negative for chills and fever.  HENT:  Positive for congestion. Negative for postnasal drip, sinus pressure, sneezing and sore throat.   Respiratory:  Positive for cough. Negative for shortness of breath and wheezing.   Musculoskeletal:  Positive for arthralgias and myalgias.  Neurological:  Positive for headaches.    Physical Exam Triage Vital Signs ED Triage Vitals [06/16/21 1509]  Enc Vitals Group     BP      Pulse Rate (!) 102     Resp 17     Temp 98.5 F (36.9 C)     Temp Source Oral     SpO2 100 %     Weight      Height      Head Circumference      Peak Flow      Pain Score      Pain Loc      Pain Edu?      Excl. in [place]?     No data found.  Updated Vital Signs Pulse (!) 102    Temp 98.5 F (36.9 C) (Oral)    Resp 17    LMP 01/14/2021    SpO2 100%   Visual Acuity Right Eye Distance:   Left Eye Distance:   Bilateral Distance:    Right Eye Near:   Left Eye Near:    Bilateral Near:     Physical Exam Vitals and nursing note reviewed.  Constitutional:      General: She is not in acute distress.    Appearance: She is not ill-appearing.  HENT:     Right Ear: Tympanic membrane normal.     Left Ear: Tympanic membrane normal.  Cardiovascular:     Rate and Rhythm: Normal rate and regular rhythm.     Pulses: Normal pulses.     Heart sounds: Normal heart sounds.  Pulmonary:     Effort: Pulmonary effort is normal.     Breath sounds: Normal breath sounds.  Abdominal:     General: Bowel sounds are normal.     Palpations: Abdomen is soft.  Neurological:     Mental Status: She is alert.     UC Treatments / Results  Labs (all labs ordered are listed, but only abnormal results are displayed) Labs Reviewed  POC INFLUENZA A AND B ANTIGEN (URGENT CARE ONLY)    EKG   Radiology No results found.  Procedures Procedures (including critical care time)  Medications Ordered in UC Medications - No data to display  Initial Impression / Assessment and Plan / UC Course  I have reviewed the triage vital signs and the nursing notes.  Pertinent labs & imaging results that were available during my care of the patient were reviewed by me and considered in my medical decision making (see chart for details).     1.  Viral URI with cough: Home remedies for the cough Point-of-care flu test is positive for influenza A Maintain adequate hydration  Tamiflu 75 mg twice daily for 5 days We will call you with recommendations if labs are abnormal Return to urgent care if symptoms worsen. Final Clinical Impressions(s) / UC Diagnoses   Final diagnoses:  Viral URI with cough  Discharge Instructions       Please use humidifier to help with cough Home remedies for cough such as honey and tea,saline nasal spray, lemon tea etc. Tylenol as needed for fever Return to urgent care if symptoms worsen.   ED Prescriptions   None    PDMP not reviewed this encounter.   Chase Picket, MD 06/16/21 1611    Chase Picket, MD 06/16/21 [phone]

## 2021-06-16 NOTE — ED Triage Notes (Signed)
Pt presents with c/o cough and body aches x 3 days.   States she felt worse this morning.

## 2021-06-16 NOTE — Discharge Instructions (Addendum)
Please use humidifier to help with cough Home remedies for cough such as honey and tea,saline nasal spray, lemon tea etc. Tylenol as needed for fever Please take medications as prescribed. Return to urgent care if symptoms worsen.

## 2021-06-18 DIAGNOSIS — Z419 Encounter for procedure for purposes other than remedying health state, unspecified: Secondary | ICD-10-CM | POA: Diagnosis not present

## 2021-06-18 NOTE — L&D Delivery Note (Deleted)
After 1 minute, the cord was clamped and cut. 20 units of pitocin diluted in 500cc LR was infused rapidly IV.  The placenta separated spontaneously and delivered via CCT and maternal pushing effort.  It was inspected and appears to be intact with a 3 VC.  ?

## 2021-06-18 NOTE — L&D Delivery Note (Addendum)
Delivery Note ?FHR had some repetitive mild/moderate variables, cx noted to be 8cms.  Placed IUPC w/plans for amnioinfusion, but cx dilated fully and baby came down to +2. After a 1 contraction 2nd stage, at 9:01 PM a viable female was delivered via Vaginal, Spontaneous (Presentation: Right Occiput Anterior).  APGAR: 9, 9; weight pending.  After 1 minute, the cord was clamped and cut. 20 units of pitocin diluted in 500cc LR was infused rapidly IV.  The placenta separated spontaneously and delivered via CCT and maternal pushing effort.  It was inspected and appears to be intact with a 3 VC.  ? ?Anesthesia: Epidural ?Episiotomy: None ?Lacerations: None ?Suture Repair:  ?Est. Blood Loss (mL): 200 ? ?Mom to postpartum.  Baby to Couplet care / Skin to Skin. ? ? ? The above was performed by Ciro Backer, PA-[facility] my direct supervision and guidance.  ? ?Jacklyn Shell ?10/19/2021, 9:46 PM ? ? ?

## 2021-07-05 ENCOUNTER — Ambulatory Visit: Payer: Medicaid Other | Admitting: *Deleted

## 2021-07-05 ENCOUNTER — Encounter: Payer: Self-pay | Admitting: *Deleted

## 2021-07-05 ENCOUNTER — Ambulatory Visit: Payer: Medicaid Other | Attending: Maternal & Fetal Medicine

## 2021-07-05 ENCOUNTER — Other Ambulatory Visit: Payer: Self-pay

## 2021-07-05 VITALS — BP 119/65 | HR 108

## 2021-07-05 DIAGNOSIS — Z3A23 23 weeks gestation of pregnancy: Secondary | ICD-10-CM

## 2021-07-05 DIAGNOSIS — Z362 Encounter for other antenatal screening follow-up: Secondary | ICD-10-CM

## 2021-07-12 ENCOUNTER — Other Ambulatory Visit (HOSPITAL_COMMUNITY)
Admission: RE | Admit: 2021-07-12 | Discharge: 2021-07-12 | Disposition: A | Discharge status: Home / Self Care | Payer: Medicaid Other | Source: Ambulatory Visit | Attending: Obstetrics and Gynecology | Admitting: Obstetrics and Gynecology

## 2021-07-12 ENCOUNTER — Other Ambulatory Visit: Payer: Self-pay

## 2021-07-12 ENCOUNTER — Ambulatory Visit (INDEPENDENT_AMBULATORY_CARE_PROVIDER_SITE_OTHER): Payer: Medicaid Other | Admitting: Obstetrics and Gynecology

## 2021-07-12 VITALS — BP 107/70 | HR 116 | Wt 182.0 lb

## 2021-07-12 DIAGNOSIS — D563 Thalassemia minor: Secondary | ICD-10-CM

## 2021-07-12 DIAGNOSIS — O219 Vomiting of pregnancy, unspecified: Secondary | ICD-10-CM

## 2021-07-12 DIAGNOSIS — O26892 Other specified pregnancy related conditions, second trimester: Secondary | ICD-10-CM | POA: Diagnosis not present

## 2021-07-12 DIAGNOSIS — N898 Other specified noninflammatory disorders of vagina: Secondary | ICD-10-CM | POA: Diagnosis not present

## 2021-07-12 DIAGNOSIS — Z3480 Encounter for supervision of other normal pregnancy, unspecified trimester: Secondary | ICD-10-CM

## 2021-07-12 DIAGNOSIS — Z8759 Personal history of other complications of pregnancy, childbirth and the puerperium: Secondary | ICD-10-CM

## 2021-07-12 DIAGNOSIS — K219 Gastro-esophageal reflux disease without esophagitis: Secondary | ICD-10-CM

## 2021-07-12 DIAGNOSIS — Z3A24 24 weeks gestation of pregnancy: Secondary | ICD-10-CM | POA: Insufficient documentation

## 2021-07-12 MED ORDER — FAMOTIDINE 20 MG PO TABS: 20.0000 mg | ORAL_TABLET | Freq: Two times a day (BID) | ORAL | 3 refills | Status: DC

## 2021-07-12 NOTE — Progress Notes (Signed)
° °  PRENATAL VISIT NOTE  Subjective:  Stacey [name] is a 31 y.o. [phone] at [redacted]w[redacted]d being seen today for ongoing prenatal care.  She is currently monitored for the following issues for this low-risk pregnancy and has Major depressive disorder, recurrent, severe without psychotic features (HCC); Hx of preeclampsia, prior pregnancy, currently pregnant, third trimester; Herpes simplex vulvovaginitis; Alpha thalassemia silent carrier; Chronic migraine; History of shoulder dystocia in prior pregnancy; Supervision of other normal pregnancy, antepartum; [redacted] weeks gestation of pregnancy; Vaginal discharge during pregnancy in second trimester; and Gastric reflux on their problem list.  Patient doing well with no acute concerns today. She reports no complaints.  Contractions: Not present. Vag. Bleeding: None.  Movement: Present. Denies leaking of fluid.   Short discussion regarding shoulder dystocia, last 2 deliveries have had dystocia  at 7 pounds and 8 pounds 13 ounces.  The following portions of the patient's history were reviewed and updated as appropriate: allergies, current medications, past family history, past medical history, past social history, past surgical history and problem list. Problem list updated.  Objective:   Vitals:   07/12/21 1529  BP: 107/70  Pulse: (!) 116  Weight: 182 lb (82.6 kg)    Fetal Status: Fetal Heart Rate (bpm): 150 Fundal Height: 24 cm Movement: Present     General:  Alert, oriented and cooperative. Patient is in no acute distress.  Skin: Skin is warm and dry. No rash noted.   Cardiovascular: Normal heart rate noted  Respiratory: Normal respiratory effort, no problems with respiration noted  Abdomen: Soft, gravid, appropriate for gestational age.  Pain/Pressure: Present     Pelvic: Cervical exam deferred        Extremities: Normal range of motion.     Mental Status:  Normal mood and affect. Normal behavior. Normal judgment and thought content.   Assessment and  Plan:  Pregnancy: [id number] at [redacted]w[redacted]d  1. Nausea and vomiting during pregnancy prior to [redacted] weeks gestation  - famotidine (PEPCID) 20 MG tablet; Take 1 tablet (20 mg total) by mouth 2 (two) times daily.  Dispense: 60 tablet; Refill: 3  2. [redacted] weeks gestation of pregnancy   3. Alpha thalassemia silent carrier   4. History of shoulder dystocia in prior pregnancy As above discussion with patient, if S>D consider growth scan in third trimester  5. Supervision of other normal pregnancy, antepartum Continue routine care, 2 hour GTT next visit  6. Vaginal discharge during pregnancy in second trimester  - Cervicovaginal ancillary only( [place])  7. Gastric reflux  - famotidine (PEPCID) 20 MG tablet; Take 1 tablet (20 mg total) by mouth 2 (two) times daily.  Dispense: 60 tablet; Refill: 3  Preterm labor symptoms and general obstetric precautions including but not limited to vaginal bleeding, contractions, leaking of fluid and fetal movement were reviewed in detail with the patient.  Please refer to After Visit Summary for other counseling recommendations.   Return in about 3 weeks (around 08/02/2021) for ROB, in person, 2 hr GTT, 3rd trim labs.   Mariel Aloe, MD Faculty Attending Center for [facility]

## 2021-07-12 NOTE — Progress Notes (Signed)
Pt complains of vaginal d/c, pelvic pressure and needs refill on heartburn Rx.

## 2021-07-14 LAB — CERVICOVAGINAL ANCILLARY ONLY
Bacterial Vaginitis (gardnerella): NEGATIVE
Candida Glabrata: NEGATIVE
Candida Vaginitis: POSITIVE — AB
Chlamydia: NEGATIVE
Comment: NEGATIVE
Comment: NEGATIVE
Comment: NEGATIVE
Comment: NEGATIVE
Comment: NEGATIVE
Comment: NORMAL
Neisseria Gonorrhea: NEGATIVE
Trichomonas: NEGATIVE

## 2021-07-18 ENCOUNTER — Other Ambulatory Visit: Payer: Self-pay

## 2021-07-18 DIAGNOSIS — B379 Candidiasis, unspecified: Secondary | ICD-10-CM

## 2021-07-18 MED ORDER — TERCONAZOLE 0.4 % VA CREA: 1.0000 | TOPICAL_CREAM | Freq: Every day | VAGINAL | 0 refills | Status: DC

## 2021-07-19 DIAGNOSIS — Z419 Encounter for procedure for purposes other than remedying health state, unspecified: Secondary | ICD-10-CM | POA: Diagnosis not present

## 2021-08-02 ENCOUNTER — Other Ambulatory Visit: Payer: Medicaid Other

## 2021-08-02 ENCOUNTER — Encounter: Payer: Self-pay | Admitting: Obstetrics and Gynecology

## 2021-08-02 ENCOUNTER — Other Ambulatory Visit: Payer: Self-pay

## 2021-08-02 ENCOUNTER — Ambulatory Visit (INDEPENDENT_AMBULATORY_CARE_PROVIDER_SITE_OTHER): Payer: Medicaid Other | Admitting: Obstetrics and Gynecology

## 2021-08-02 DIAGNOSIS — Z3480 Encounter for supervision of other normal pregnancy, unspecified trimester: Secondary | ICD-10-CM

## 2021-08-02 DIAGNOSIS — D563 Thalassemia minor: Secondary | ICD-10-CM

## 2021-08-02 DIAGNOSIS — Z8619 Personal history of other infectious and parasitic diseases: Secondary | ICD-10-CM | POA: Insufficient documentation

## 2021-08-02 DIAGNOSIS — Z8759 Personal history of other complications of pregnancy, childbirth and the puerperium: Secondary | ICD-10-CM

## 2021-08-02 DIAGNOSIS — O09299 Supervision of pregnancy with other poor reproductive or obstetric history, unspecified trimester: Secondary | ICD-10-CM | POA: Insufficient documentation

## 2021-08-02 NOTE — Patient Instructions (Signed)

## 2021-08-02 NOTE — Progress Notes (Deleted)
PHQ9=0 GAD7= 2

## 2021-08-02 NOTE — Progress Notes (Signed)
Pt complains of frequent daily HA's.   PHQ9= 9 GAD=3

## 2021-08-02 NOTE — Progress Notes (Signed)
Subjective:  Stacey [name] is a 31 y.o. [phone] at [redacted]w[redacted]d being seen today for ongoing prenatal care.  She is currently monitored for the following issues for this high-risk pregnancy and has Major depressive disorder, recurrent, severe without psychotic features ([place]); Alpha thalassemia silent carrier; Chronic migraine; History of shoulder dystocia in prior pregnancy; Supervision of other normal pregnancy, antepartum; Gastric reflux; History of pre-eclampsia in prior pregnancy, currently pregnant; and History of ELISA positive for HSV on their problem list.  Patient reports general discomforts of pregnancy.  Contractions: Not present. Vag. Bleeding: None.  Movement: Present. Denies leaking of fluid.   The following portions of the patient's history were reviewed and updated as appropriate: allergies, current medications, past family history, past medical history, past social history, past surgical history and problem list. Problem list updated.  Objective:   Vitals:   08/02/21 0949  BP: 115/73  Pulse: (!) 102  Weight: 183 lb (83 kg)    Fetal Status: Fetal Heart Rate (bpm): 145   Movement: Present     General:  Alert, oriented and cooperative. Patient is in no acute distress.  Skin: Skin is warm and dry. No rash noted.   Cardiovascular: Normal heart rate noted  Respiratory: Normal respiratory effort, no problems with respiration noted  Abdomen: Soft, gravid, appropriate for gestational age. Pain/Pressure: Absent     Pelvic:  Cervical exam deferred        Extremities: Normal range of motion.     Mental Status: Normal mood and affect. Normal behavior. Normal judgment and thought content.   Urinalysis:      Assessment and Plan:  Pregnancy: [id number] at [redacted]w[redacted]d  1. Supervision of other normal pregnancy, antepartum Stable Glucola today  2. History of shoulder dystocia in prior pregnancy Growth 29 % on 07/05/21  3. Alpha thalassemia silent carrier Stable  4. History of pre-eclampsia in  prior pregnancy, currently pregnant BP stable No S/Sx at present  Continue with qd BASA  5. History of ELISA positive for HSV Suppression at 36 weeks  Preterm labor symptoms and general obstetric precautions including but not limited to vaginal bleeding, contractions, leaking of fluid and fetal movement were reviewed in detail with the patient. Please refer to After Visit Summary for other counseling recommendations.  Return in about 2 weeks (around 08/16/2021) for OB visit, face to face, any provider.   Chancy Milroy, MD

## 2021-08-03 LAB — RPR: RPR Ser Ql: NONREACTIVE

## 2021-08-03 LAB — CBC
Hematocrit: 31.5 % — ABNORMAL LOW (ref 34.0–46.6)
Hemoglobin: 10.5 g/dL — ABNORMAL LOW (ref 11.1–15.9)
MCH: 27.1 pg (ref 26.6–33.0)
MCHC: 33.3 g/dL (ref 31.5–35.7)
MCV: 81 fL (ref 79–97)
Platelets: 305 10*3/uL (ref 150–450)
RBC: 3.88 x10E6/uL (ref 3.77–5.28)
RDW: 13.1 % (ref 11.7–15.4)
WBC: 16 10*3/uL — ABNORMAL HIGH (ref 3.4–10.8)

## 2021-08-03 LAB — GLUCOSE TOLERANCE, 2 HOURS W/ 1HR
Glucose, 1 hour: 161 mg/dL (ref 70–179)
Glucose, 2 hour: 137 mg/dL (ref 70–152)
Glucose, Fasting: 90 mg/dL (ref 70–91)

## 2021-08-03 LAB — HIV ANTIBODY (ROUTINE TESTING W REFLEX): HIV Screen 4th Generation wRfx: NONREACTIVE

## 2021-08-16 ENCOUNTER — Ambulatory Visit (INDEPENDENT_AMBULATORY_CARE_PROVIDER_SITE_OTHER): Payer: Medicaid Other | Admitting: Obstetrics

## 2021-08-16 ENCOUNTER — Encounter: Payer: Self-pay | Admitting: Obstetrics

## 2021-08-16 VITALS — BP 117/72 | HR 111 | Wt 184.2 lb

## 2021-08-16 DIAGNOSIS — J301 Allergic rhinitis due to pollen: Secondary | ICD-10-CM

## 2021-08-16 DIAGNOSIS — Z419 Encounter for procedure for purposes other than remedying health state, unspecified: Secondary | ICD-10-CM | POA: Diagnosis not present

## 2021-08-16 DIAGNOSIS — O99210 Obesity complicating pregnancy, unspecified trimester: Secondary | ICD-10-CM

## 2021-08-16 DIAGNOSIS — Z8759 Personal history of other complications of pregnancy, childbirth and the puerperium: Secondary | ICD-10-CM

## 2021-08-16 DIAGNOSIS — O0990 Supervision of high risk pregnancy, unspecified, unspecified trimester: Secondary | ICD-10-CM

## 2021-08-16 DIAGNOSIS — O09299 Supervision of pregnancy with other poor reproductive or obstetric history, unspecified trimester: Secondary | ICD-10-CM

## 2021-08-16 NOTE — Progress Notes (Signed)
Subjective:  ?Stacey [name] is a 31 y.o. [phone] at [redacted]w[redacted]d being seen today for ongoing prenatal care.  She is currently monitored for the following issues for this high-risk pregnancy and has Major depressive disorder, recurrent, severe without psychotic features ([place]); Alpha thalassemia silent carrier; Chronic migraine; History of shoulder dystocia in prior pregnancy; Supervision of other normal pregnancy, antepartum; Gastric reflux; History of pre-eclampsia in prior pregnancy, currently pregnant; and History of ELISA positive for HSV on their problem list. ? ?Patient reports backache and rhinitis .  Contractions: Irritability. Vag. Bleeding: None.  Movement: Present. Denies leaking of fluid.  ? ?The following portions of the patient's history were reviewed and updated as appropriate: allergies, current medications, past family history, past medical history, past social history, past surgical history and problem list. Problem list updated. ? ?Objective:  ? ?Vitals:  ? 08/16/21 1612  ?BP: 117/72  ?Pulse: (!) 111  ?Weight: 184 lb 3.2 oz (83.6 kg)  ? ? ?Fetal Status: Fetal Heart Rate (bpm): 155   Movement: Present    ? ?General:  Alert, oriented and cooperative. Patient is in no acute distress.  ?Skin: Skin is warm and dry. No rash noted.   ?Cardiovascular: Normal heart rate noted  ?Respiratory: Normal respiratory effort, no problems with respiration noted  ?Abdomen: Soft, gravid, appropriate for gestational age. Pain/Pressure: Present     ?Pelvic:  Cervical exam deferred        ?Extremities: Normal range of motion.  Edema: None  ?Mental Status: Normal mood and affect. Normal behavior. Normal judgment and thought content.  ? ?Urinalysis:     ? ?Assessment and Plan:  ?Pregnancy: [id number] at [redacted]w[redacted]d ? ?1. Supervision of high risk pregnancy, antepartum ? ?2. History of shoulder dystocia in prior pregnancy ?Rx: ?- [place] MFM OB FOLLOW UP; Future ? ?3. History of pre-eclampsia in prior pregnancy, currently pregnant ?- taking  Baby ASA ? ?4. Obesity affecting pregnancy, antepartum ? ?5. Seasonal allergic rhinitis due to pollen ? ?  ?Preterm labor symptoms and general obstetric precautions including but not limited to vaginal bleeding, contractions, leaking of fluid and fetal movement were reviewed in detail with the patient. ?Please refer to After Visit Summary for other counseling recommendations.  ? ?Return in about 2 weeks (around 08/30/2021) for [facility]. ? ? ?Shelly Bombard, MD  ?08/16/21  ?

## 2021-08-16 NOTE — Progress Notes (Signed)
Patient presents for ROB. Patient has no concerns today. 

## 2021-08-31 ENCOUNTER — Encounter: Payer: Medicaid Other | Admitting: Family Medicine

## 2021-08-31 ENCOUNTER — Ambulatory Visit (INDEPENDENT_AMBULATORY_CARE_PROVIDER_SITE_OTHER): Payer: Medicaid Other | Admitting: Family Medicine

## 2021-08-31 ENCOUNTER — Encounter: Payer: Self-pay | Admitting: Family Medicine

## 2021-08-31 ENCOUNTER — Other Ambulatory Visit: Payer: Self-pay

## 2021-08-31 VITALS — BP 110/65 | HR 109 | Wt 183.0 lb

## 2021-08-31 DIAGNOSIS — Z3A32 32 weeks gestation of pregnancy: Secondary | ICD-10-CM

## 2021-08-31 DIAGNOSIS — Z3480 Encounter for supervision of other normal pregnancy, unspecified trimester: Secondary | ICD-10-CM

## 2021-08-31 DIAGNOSIS — Z8619 Personal history of other infectious and parasitic diseases: Secondary | ICD-10-CM

## 2021-08-31 DIAGNOSIS — O09299 Supervision of pregnancy with other poor reproductive or obstetric history, unspecified trimester: Secondary | ICD-10-CM | POA: Diagnosis not present

## 2021-08-31 DIAGNOSIS — G43909 Migraine, unspecified, not intractable, without status migrainosus: Secondary | ICD-10-CM

## 2021-08-31 MED ORDER — MAGNESIUM 400 MG PO CAPS: 400.0000 mg | ORAL_CAPSULE | Freq: Every day | ORAL | 2 refills | Status: DC

## 2021-08-31 NOTE — Progress Notes (Signed)
ROB, c/o  headaches 8/10 daily , dizzy spells, Allergies and needs a Rx. ?

## 2021-08-31 NOTE — Progress Notes (Signed)
? ?PRENATAL VISIT NOTE ? ?Subjective:  ?Stacey [name] is a 31 y.o. [phone] at 36w0dbeing seen today for ongoing prenatal care.  She is currently monitored for the following issues for this high-risk pregnancy and has Major depressive disorder, recurrent, severe without psychotic features ([place]; Alpha thalassemia silent carrier; Migraines; History of shoulder dystocia in prior pregnancy; Supervision of other normal pregnancy, antepartum; Gastric reflux; History of pre-eclampsia in prior pregnancy, currently pregnant; and History of ELISA positive for HSV on their problem list. ? ?Patient reports dealing with a migraine over the last several days. Reports history of chronic migraines, previously followed by Neurology for this. Reports these are her typical migraines, mainly left sided. Gets some visual changes during her headaches that resolve when it goes away. Currently just taking Tylenol for her symptoms. Was on a different medication when she was seeing Neurology for this in the past, cannot remember the name. Does note that she does not eat meals regularly. Sometimes finds that her symptoms improve after she eats. Usually skips breakfast and eats lunch and dinner. Will also try to have snacks in between but sometimes goes a long time without eating. Thinks she does a good job with fluid intake. Denies any weakness, RUQ pain, or LE edema. She would like to know if there are any other options to help with her headaches. Contractions: Not present. Vag. Bleeding: None.  Movement: Present. Denies leaking of fluid.  ? ?The following portions of the patient's history were reviewed and updated as appropriate: allergies, current medications, past family history, past medical history, past social history, past surgical history and problem list.  ? ?Objective:  ? ?Vitals:  ? 08/31/21 1556  ?BP: 110/65  ?Pulse: (!) 109  ?Weight: 183 lb (83 kg)  ? ? ?Fetal Status: Fetal Heart Rate (bpm): 151   Movement: Present     ? ?General:  Alert, oriented and cooperative. Patient is in no acute distress.  ?Skin: Skin is warm and dry. No rash noted.   ?Cardiovascular: Normal heart rate noted.  ?Respiratory: Normal respiratory effort, no problems with respiration noted.  ?Abdomen: Soft, gravid, appropriate for gestational age.       ?Pelvic: Cervical exam deferred.  ?Extremities: Normal range of motion.  Edema: None.  ?Mental Status: Normal mood and affect. Normal behavior. Normal judgment and thought content.  ? ?Assessment and Plan:  ?Pregnancy: [id number] 367w0d ?1. Supervision of other normal pregnancy, antepartum ?2. [redacted] weeks gestation of pregnancy ?Progressing well. No OB concerns today. FHT within normal limits. Follow up visit in 2 weeks for next prenatal visit.  ? ?3. Migraine without status migrainosus, not intractable, unspecified migraine type ?Patient reports dealing with migraines over the last several days. Typical of her usual migraines. Reports they have been worse throughout her entire pregnancy. Also not eating regular meals/snacks, which may also be contributing. Reviewed prior Neurology office visit notes. Had similar worsening of her migraines during prior pregnancy and was prescribed magnesium for prevention therapy. Discussed with patient today, will plan to start magnesium daily - sent to pharmacy. Encouraged her to continue frequent hydration with meals every 3-4 hours. Will reassess at next visit.  ?- Magnesium 400 MG CAPS; Take 400 mg by mouth daily.  Dispense: 30 capsule; Refill: 2 ? ?4. History of pre-eclampsia in prior pregnancy, currently pregnant ?No issues with BP thus far with her current pregnancy. Hx of chronic migraines as discussed above. Advised patient of low suspicion that headaches are related to pre-eclampsia at  this time, especially given normal BP in office. Will obtain baseline labs as below and follow up results. Precautions reviewed with patient and she voiced understanding.  ?- CBC ?- Comp  Met (CMET) ?- Protein / creatinine ratio, urine ? ?5. History of ELISA positive for HSV ?Plan for prophylaxis at 36 weeks. ? ?Preterm labor symptoms and general obstetric precautions including but not limited to vaginal bleeding, contractions, leaking of fluid and fetal movement were reviewed in detail with the patient. ? ?Please refer to After Visit Summary for other counseling recommendations.  ? ?Return in about 2 weeks (around 09/14/2021) for follow up OB visit. ? ?Future Appointments  ?Date Time Provider [place]  ?09/05/2021  3:15 PM [facility] NURSE [facility]  ?09/05/2021  3:30 PM [facility] US3 [facility] [place]  ?09/13/2021  3:30 PM Griffin Basil, MD [facility] None  ? ?Genia Del, MD ? ?

## 2021-09-01 LAB — COMPREHENSIVE METABOLIC PANEL
ALT: 7 IU/L (ref 0–32)
AST: 9 IU/L (ref 0–40)
Albumin/Globulin Ratio: 1.5 (ref 1.2–2.2)
Albumin: 3.9 g/dL (ref 3.9–5.0)
Alkaline Phosphatase: 85 IU/L (ref 44–121)
BUN/Creatinine Ratio: 7 — ABNORMAL LOW (ref 9–23)
BUN: 3 mg/dL — ABNORMAL LOW (ref 6–20)
Bilirubin Total: 0.3 mg/dL (ref 0.0–1.2)
CO2: 18 mmol/L — ABNORMAL LOW (ref 20–29)
Calcium: 9.5 mg/dL (ref 8.7–10.2)
Chloride: 101 mmol/L (ref 96–106)
Creatinine, Ser: 0.46 mg/dL — ABNORMAL LOW (ref 0.57–1.00)
Globulin, Total: 2.6 g/dL (ref 1.5–4.5)
Glucose: 108 mg/dL — ABNORMAL HIGH (ref 70–99)
Potassium: 3.7 mmol/L (ref 3.5–5.2)
Sodium: 135 mmol/L (ref 134–144)
Total Protein: 6.5 g/dL (ref 6.0–8.5)
eGFR: 133 mL/min/{1.73_m2} (ref >59)

## 2021-09-01 LAB — CBC
Hematocrit: 29.1 % — ABNORMAL LOW (ref 34.0–46.6)
Hemoglobin: 9.8 g/dL — ABNORMAL LOW (ref 11.1–15.9)
MCH: 26.8 pg (ref 26.6–33.0)
MCHC: 33.7 g/dL (ref 31.5–35.7)
MCV: 80 fL (ref 79–97)
Platelets: 322 10*3/uL (ref 150–450)
RBC: 3.66 x10E6/uL — ABNORMAL LOW (ref 3.77–5.28)
RDW: 13.7 % (ref 11.7–15.4)
WBC: 17 10*3/uL — ABNORMAL HIGH (ref 3.4–10.8)

## 2021-09-02 LAB — PROTEIN / CREATININE RATIO, URINE
Creatinine, Urine: 285.2 mg/dL
Protein, Ur: 67.6 mg/dL
Protein/Creat Ratio: 237 mg/g creat — ABNORMAL HIGH (ref 0–200)

## 2021-09-03 ENCOUNTER — Other Ambulatory Visit: Payer: Self-pay | Admitting: Family Medicine

## 2021-09-03 DIAGNOSIS — D509 Iron deficiency anemia, unspecified: Secondary | ICD-10-CM

## 2021-09-03 MED ORDER — FERROUS SULFATE 325 (65 FE) MG PO TABS: 325.0000 mg | ORAL_TABLET | ORAL | 2 refills | Status: DC

## 2021-09-05 ENCOUNTER — Ambulatory Visit: Payer: Medicaid Other | Admitting: *Deleted

## 2021-09-05 ENCOUNTER — Other Ambulatory Visit: Payer: Self-pay

## 2021-09-05 ENCOUNTER — Ambulatory Visit: Payer: Medicaid Other | Attending: Obstetrics

## 2021-09-05 VITALS — BP 117/65 | HR 126

## 2021-09-05 DIAGNOSIS — O09299 Supervision of pregnancy with other poor reproductive or obstetric history, unspecified trimester: Secondary | ICD-10-CM | POA: Diagnosis not present

## 2021-09-05 DIAGNOSIS — O99213 Obesity complicating pregnancy, third trimester: Secondary | ICD-10-CM

## 2021-09-05 DIAGNOSIS — Z8759 Personal history of other complications of pregnancy, childbirth and the puerperium: Secondary | ICD-10-CM | POA: Diagnosis not present

## 2021-09-06 ENCOUNTER — Other Ambulatory Visit: Payer: Self-pay | Admitting: *Deleted

## 2021-09-06 DIAGNOSIS — O09299 Supervision of pregnancy with other poor reproductive or obstetric history, unspecified trimester: Secondary | ICD-10-CM

## 2021-09-13 ENCOUNTER — Ambulatory Visit (INDEPENDENT_AMBULATORY_CARE_PROVIDER_SITE_OTHER): Payer: Medicaid Other | Admitting: Obstetrics and Gynecology

## 2021-09-13 VITALS — BP 108/70 | HR 112 | Wt 182.0 lb

## 2021-09-13 DIAGNOSIS — Z3A33 33 weeks gestation of pregnancy: Secondary | ICD-10-CM

## 2021-09-13 DIAGNOSIS — O09299 Supervision of pregnancy with other poor reproductive or obstetric history, unspecified trimester: Secondary | ICD-10-CM

## 2021-09-13 DIAGNOSIS — D563 Thalassemia minor: Secondary | ICD-10-CM

## 2021-09-13 DIAGNOSIS — B009 Herpesviral infection, unspecified: Secondary | ICD-10-CM

## 2021-09-13 DIAGNOSIS — Z3480 Encounter for supervision of other normal pregnancy, unspecified trimester: Secondary | ICD-10-CM

## 2021-09-13 NOTE — Progress Notes (Signed)
Pt states she has been having dizziness, mainly with activity.  ?Pt is still having hip/ back pain, no relief with support belt.  ? ? ?

## 2021-09-13 NOTE — Progress Notes (Signed)
? ?  PRENATAL VISIT NOTE ? ?Subjective:  ?Stacey [name] is a 31 y.o. [phone] at [redacted]w[redacted]d being seen today for ongoing prenatal care.  She is currently monitored for the following issues for this high-risk pregnancy and has Major depressive disorder, recurrent, severe without psychotic features ([place]); Alpha thalassemia silent carrier; Migraines; History of shoulder dystocia in prior pregnancy, currently pregnant; Supervision of other normal pregnancy, antepartum; Gastric reflux; History of pre-eclampsia in prior pregnancy, currently pregnant; History of ELISA positive for HSV; and HSV infection on their problem list. ? ?Patient doing well with no acute concerns today. She reports no complaints.  Contractions: Not present. Vag. Bleeding: None.  Movement: Present. Denies leaking of fluid.  ? ?The following portions of the patient's history were reviewed and updated as appropriate: allergies, current medications, past family history, past medical history, past social history, past surgical history and problem list. Problem list updated. ? ?Objective:  ? ?Vitals:  ? 09/13/21 1543  ?BP: 108/70  ?Pulse: (!) 112  ?Weight: 182 lb (82.6 kg)  ? ? ?Fetal Status: Fetal Heart Rate (bpm): 145 Fundal Height: 36 cm Movement: Present    ? ?General:  Alert, oriented and cooperative. Patient is in no acute distress.  ?Skin: Skin is warm and dry. No rash noted.   ?Cardiovascular: Normal heart rate noted  ?Respiratory: Normal respiratory effort, no problems with respiration noted  ?Abdomen: Soft, gravid, appropriate for gestational age.  Pain/Pressure: Present     ?Pelvic: Cervical exam deferred        ?Extremities: Normal range of motion.     ?Mental Status:  Normal mood and affect. Normal behavior. Normal judgment and thought content.  ? ?Assessment and Plan:  ?Pregnancy: [id number] at [redacted]w[redacted]d ? ?1. [redacted] weeks gestation of pregnancy ? ? ?2. Supervision of other normal pregnancy, antepartum ?Continue routine care ?Pt smokes 1-2 "black and mild"  cigarettes today.  Pt advised to taper down to one cig/day for 1-2 weeks then quit if possible.  Consider adjuvant medication after delivery ? ?3. Alpha thalassemia silent carrier ? ? ?4. HSV infection ?Prophylaxis at 36 weeks ? ?5. History of shoulder dystocia in prior pregnancy, currently pregnant ?Recent growth scan showed EFW at 18%, repeat growth 10/10/21 ? ?6. History of pre-eclampsia in prior pregnancy, currently pregnant ?No s/sx of preeclampsia ? ?Preterm labor symptoms and general obstetric precautions including but not limited to vaginal bleeding, contractions, leaking of fluid and fetal movement were reviewed in detail with the patient. ? ?Please refer to After Visit Summary for other counseling recommendations.  ? ?Return in about 2 weeks (around 09/27/2021) for [facility], in person, 36 weeks swabs. ? ? ?Lynnda Shields, MD ?Faculty Attending ?[facility] [place] ?  ?

## 2021-09-14 ENCOUNTER — Encounter: Payer: Medicaid Other | Admitting: Obstetrics and Gynecology

## 2021-09-16 DIAGNOSIS — Z419 Encounter for procedure for purposes other than remedying health state, unspecified: Secondary | ICD-10-CM | POA: Diagnosis not present

## 2021-09-18 ENCOUNTER — Other Ambulatory Visit: Payer: Self-pay

## 2021-09-18 ENCOUNTER — Inpatient Hospital Stay (HOSPITAL_COMMUNITY)
Admission: AD | Admit: 2021-09-18 | Discharge: 2021-09-19 | Disposition: A | Discharge status: Home / Self Care | Payer: Medicaid Other | Attending: Obstetrics and Gynecology | Admitting: Obstetrics and Gynecology

## 2021-09-18 DIAGNOSIS — R102 Pelvic and perineal pain: Secondary | ICD-10-CM | POA: Insufficient documentation

## 2021-09-18 DIAGNOSIS — O99333 Smoking (tobacco) complicating pregnancy, third trimester: Secondary | ICD-10-CM | POA: Insufficient documentation

## 2021-09-18 DIAGNOSIS — Z3689 Encounter for other specified antenatal screening: Secondary | ICD-10-CM

## 2021-09-18 DIAGNOSIS — F17290 Nicotine dependence, other tobacco product, uncomplicated: Secondary | ICD-10-CM | POA: Insufficient documentation

## 2021-09-18 DIAGNOSIS — O99891 Other specified diseases and conditions complicating pregnancy: Secondary | ICD-10-CM | POA: Insufficient documentation

## 2021-09-18 DIAGNOSIS — G43709 Chronic migraine without aura, not intractable, without status migrainosus: Secondary | ICD-10-CM

## 2021-09-18 DIAGNOSIS — O99353 Diseases of the nervous system complicating pregnancy, third trimester: Secondary | ICD-10-CM | POA: Insufficient documentation

## 2021-09-18 DIAGNOSIS — N949 Unspecified condition associated with female genital organs and menstrual cycle: Secondary | ICD-10-CM

## 2021-09-18 DIAGNOSIS — M549 Dorsalgia, unspecified: Secondary | ICD-10-CM | POA: Insufficient documentation

## 2021-09-18 DIAGNOSIS — Z3A34 34 weeks gestation of pregnancy: Secondary | ICD-10-CM

## 2021-09-18 NOTE — MAU Note (Signed)
FEELS BACK PAIN- STARTED YESTERDAY  ?LOWER ABD CRAMPS TODAY  ?[place] ?LAST SEX- 1 WEEK AGO ?DENIES ANY S/S OF HSV ?

## 2021-09-19 ENCOUNTER — Encounter (HOSPITAL_COMMUNITY): Payer: Self-pay | Admitting: Obstetrics and Gynecology

## 2021-09-19 DIAGNOSIS — Z3A34 34 weeks gestation of pregnancy: Secondary | ICD-10-CM | POA: Diagnosis not present

## 2021-09-19 DIAGNOSIS — O99891 Other specified diseases and conditions complicating pregnancy: Secondary | ICD-10-CM

## 2021-09-19 DIAGNOSIS — G43709 Chronic migraine without aura, not intractable, without status migrainosus: Secondary | ICD-10-CM | POA: Diagnosis not present

## 2021-09-19 DIAGNOSIS — M549 Dorsalgia, unspecified: Secondary | ICD-10-CM | POA: Diagnosis not present

## 2021-09-19 DIAGNOSIS — R102 Pelvic and perineal pain: Secondary | ICD-10-CM | POA: Diagnosis not present

## 2021-09-19 DIAGNOSIS — O99333 Smoking (tobacco) complicating pregnancy, third trimester: Secondary | ICD-10-CM | POA: Diagnosis not present

## 2021-09-19 DIAGNOSIS — O99353 Diseases of the nervous system complicating pregnancy, third trimester: Secondary | ICD-10-CM | POA: Diagnosis not present

## 2021-09-19 DIAGNOSIS — F17290 Nicotine dependence, other tobacco product, uncomplicated: Secondary | ICD-10-CM | POA: Diagnosis not present

## 2021-09-19 DIAGNOSIS — M5440 Lumbago with sciatica, unspecified side: Secondary | ICD-10-CM | POA: Diagnosis present

## 2021-09-19 LAB — URINALYSIS, ROUTINE W REFLEX MICROSCOPIC
Bilirubin Urine: NEGATIVE
Glucose, UA: NEGATIVE mg/dL
Hgb urine dipstick: NEGATIVE
Ketones, ur: NEGATIVE mg/dL
Leukocytes,Ua: NEGATIVE
Nitrite: NEGATIVE
Protein, ur: 30 mg/dL — AB
Specific Gravity, Urine: 1.028 (ref 1.005–1.030)
pH: 5 (ref 5.0–8.0)

## 2021-09-19 LAB — WET PREP, GENITAL
Clue Cells Wet Prep HPF POC: NONE SEEN
Sperm: NONE SEEN
Trich, Wet Prep: NONE SEEN
WBC, Wet Prep HPF POC: >=10 — AB (ref <10)
Yeast Wet Prep HPF POC: NONE SEEN

## 2021-09-19 LAB — GC/CHLAMYDIA PROBE AMP (~~LOC~~) NOT AT ARMC
Chlamydia: NEGATIVE
Comment: NEGATIVE
Comment: NORMAL
Neisseria Gonorrhea: NEGATIVE

## 2021-09-19 MED ORDER — BUTALBITAL-APAP-CAFFEINE 50-325-40 MG PO TABS
2.0000 | ORAL_TABLET | Freq: Once | ORAL | Status: AC
  Administered 2021-09-19: 2 via ORAL
  Filled 2021-09-19: qty 2

## 2021-09-19 MED ORDER — CYCLOBENZAPRINE HCL 10 MG PO TABS: 10.0000 mg | ORAL_TABLET | Freq: Three times a day (TID) | ORAL | 1 refills | Status: DC | PRN

## 2021-09-19 NOTE — Discharge Instructions (Signed)
Stacey [name] w/ [name] [address] [place], [place] [address] [phone] [email or web address]  

## 2021-09-19 NOTE — MAU Provider Note (Signed)
Chief Complaint:  Back Pain and Abdominal Pain ? ? Event Date/Time  ? First Provider Initiated Contact with Patient 09/19/21 0037   ?  ?HPI: Stacey [name] is a 31 y.o. [phone] at [redacted]w[redacted]d who presents to maternity admissions reporting low back pain with sciatica that started yesterday while laying down then got progressively worse as she spent the day cleaning and then worked today. 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 Has noticed some vaginal itching but no increased discharge or odor. Drove herself here.  ? ?Reports a migraine, she has a history of them - Tylenol is all she takes but it does not work. Prior to pregnancy she was on fiorcet which worked well, has not taken since she got pregnant. Denies vaginal bleeding, leaking of fluid, decreased fetal movement, fever, falls, or recent illness.  ? ?Pregnancy Course: Receives prenatal care at [facility], prenatal records reviewed. ? ?Past Medical History:  ?Diagnosis Date  ? Alpha thalassemia silent carrier 10/11/2018  ? [x]  Genetic counseling [ ] FOB testing  ? GERD (gastroesophageal reflux disease)   ? 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 On Valtrex-confirm taking prophylaxis > 34 wks  ? HSV-2 infection   ? No hx of outbreak  ? Hx of preeclampsia, prior pregnancy, currently pregnant, third trimester 11/09/2016  ? Postpartum Normal CMP at baseline Took ASA for several weeks but then stopped  ? Kidney infection   ? kidney infection 2012  ? Migraine   ? ?OB History  ?Gravida Para Term Preterm AB Living  ?7 3 3  0 3 3  ?SAB IAB Ectopic Multiple Live Births  ?0 2 0 0 3  ?  ?# Outcome Date GA Lbr Len/2nd Weight Sex Delivery Anes PTL Lv  ?7 Current           ?6 Term 04/13/19 [redacted]w[redacted]d 19:37 / 00:18 8 lb 12.4 oz (3.98 kg) M Vag-Spont EPI  LIV  ?5 Term 05/10/17 [redacted]w[redacted]d 06:06 / 00:17 7 lb 0.5 oz (3.189 kg) M Vag-Spont EPI  LIV  ?4 AB 2017          ?3 IAB  10/30/13          ?   Birth Comments: System Generated. Please review and update pregnancy details.  ?2 Term 11/02/10 [redacted]w[redacted]d  7 lb 3 oz (3.26 kg) F Vag-Spont EPI  LIV  ?   Birth Comments: post partum pre eclampsia  ?1 IAB           ?   Birth Comments: System Generated. Please review and update pregnancy details.  ? ?Past Surgical History:  ?Procedure Laterality Date  ? NO PAST SURGERIES    ? ?Family History  ?Problem Relation Age of Onset  ? Hypertension Mother   ? Migraines Mother   ? Asthma Brother   ? Arthritis Maternal Grandmother   ? Cancer Maternal Grandmother   ? Arthritis Paternal Grandmother   ? Healthy Father   ? Anesthesia problems Neg Hx   ? ?Social History  ? ?Tobacco Use  ? Smoking status: Every Day  ?  Types: Cigars  ?  Last attempt to quit: 01/25/2019  ?  Years since quitting: 2.6  ? Smokeless tobacco: Never  ? Tobacco comments:  ?  black and mild-half of one every other day  ?Vaping Use  ? Vaping Use: Never used  ?Substance Use Topics  ? Alcohol use: Not Currently  ?  Comment: occ  ? Drug use: No  ? ?No Known Allergies ?No medications prior to admission.  ? ?I have reviewed patient's Past Medical Hx, Surgical Hx, Family Hx, Social Hx, medications and allergies.  ? ?ROS:  ?Review of Systems  ?Constitutional:  Negative for diaphoresis and fever.  ?HENT:  Negative for congestion and sore throat.   ?Eyes:  Negative for photophobia and visual disturbance.  ?Respiratory:  Negative for choking and shortness of breath.   ?Cardiovascular:  Negative for chest pain.  ?Gastrointestinal:  Positive for abdominal pain. Negative for nausea and vomiting.  ?Musculoskeletal:  Positive for back pain.  ?Neurological:  Positive for headaches. Negative for syncope and light-headedness.  ? ?Physical Exam  ?Patient Vitals for the past 24 hrs: ? BP Temp Temp src Pulse Resp SpO2 Height Weight  ?09/19/21 0150 (!) 99/55 -- -- (!) 101 -- 99 % -- --  ?09/19/21 0140 -- -- -- -- -- 100 % -- --  ?09/19/21 0130 -- -- -- -- -- 98 % -- --   ?09/19/21 0120 -- -- -- -- -- 99 % -- --  ?09/19/21 0110 -- -- -- -- -- 99 % -- --  ?09/19/21 0100 -- -- -- -- -- 99 % -- --  ?09/19/21 0050 -- -- -- -- -- 100 % -- --  ?09/19/21 0040 -- -- -- -- -- 99 % -- --  ?09/19/21 0030 -- -- -- -- -- 98 % -- --  ?09/19/21 0027 115/67 -- -- (!) 109 20 100 % -- --  ?09/18/21 2357 122/71 99 ?F (37.2 ?C) Oral (!) 109 20 -- 5' (1.524 m) 186 lb 11.2 oz (84.7 kg)  ? ? ?Constitutional: Well-developed, well-nourished female in no acute distress.  ?Cardiovascular: mildly tachycardic, normal rhythm ?Respiratory: normal effort ?GI: Abd soft, non-tender, gravid appropriate for gestational age ?MS: Extremities nontender, no edema, normal ROM ?Neurologic: Alert and oriented x 4.  ?GU: no CVA tenderness ?Pelvic: NEFG, physiologic discharge, no blood, RN obtained swabs ? Dilation: Closed ?Effacement (%): Thick ?Cervical Position: Posterior ?Station: Ballotable ?Presentation: Undeterminable ?Exam by:: Edd ArbourJamilla Petrea Fredenburg, CNM ? ?Fetal Tracing: reactive ?Baseline: 125 ?Variability: moderate ?Accelerations: 15x15 ?Decelerations: none ?Toco: some UI, very occasional contraction ?  ?Labs: ?Results for orders placed or performed during the hospital encounter of 09/18/21 (from the past 24 hour(s))  ?Urinalysis, Routine w reflex microscopic Urine, Clean Catch     Status: Abnormal  ? Collection Time: 09/19/21 12:16 AM  ?Result Value Ref Range  ? Color, Urine YELLOW YELLOW  ? APPearance HAZY (A) CLEAR  ? Specific Gravity, Urine 1.028 1.005 - 1.030  ? pH 5.0 5.0 - 8.0  ? Glucose, UA NEGATIVE NEGATIVE mg/dL  ? Hgb urine dipstick NEGATIVE NEGATIVE  ? Bilirubin Urine NEGATIVE NEGATIVE  ? Ketones, ur NEGATIVE NEGATIVE mg/dL  ? Protein, ur 30 (A) NEGATIVE mg/dL  ? Nitrite NEGATIVE NEGATIVE  ? Leukocytes,Ua NEGATIVE NEGATIVE  ? RBC / HPF 0-5 0 - 5 RBC/hpf  ? WBC, UA 0-5 0 - 5 WBC/hpf  ? Bacteria, UA FEW (A) NONE SEEN  ? Squamous Epithelial / LPF 0-5 0 - 5  ? Mucus PRESENT   ?Wet prep, genital     Status: Abnormal   ? Collection Time: 09/19/21 12:55 AM  ?Result Value Ref Range  ? Yeast Wet Prep HPF POC NONE SEEN NONE SEEN  ? Trich, Wet Prep NONE SEEN NONE SEEN  ? Clue Cells Wet Prep HPF POC NONE SEEN NONE SEEN  ? WBC, Wet Prep HPF  POC >=10 (A) <10  ? Sperm NONE SEEN   ? ?Imaging:  ?No results found. ? ?MAU Course: ?Orders Placed This Encounter  ?Procedures  ? Wet prep, genital  ? Urinalysis, Routine w reflex microscopic Urine, Clean Catch  ? Discharge patient  ? ?Meds ordered this encounter  ?Medications  ? butalbital-acetaminophen-caffeine (FIORICET) 50-325-40 MG per tablet 2 tablet  ? cyclobenzaprine (FLEXERIL) 10 MG tablet  ?  Sig: Take 1 tablet (10 mg total) by mouth every 8 (eight) hours as needed for muscle spasms.  ?  Dispense:  30 tablet  ?  Refill:  1  ?  Order Specific Question:   Supervising Provider  ?  Answer:   Reva Bores [[id number]]  ? ?MDM: ?Pt did not drive herself, so decided not to treat back pain with flexeril. Will send prescription to [facility] pharmacy. Since fiorcet has worked in the past for her headache, offered it to her as a one time dose, she accepted. ? ?Long discussion reviewing how to wear pregnancy belt, stretches to relieve back pain and offered chiropractic info (which she accepted).  ? ?On reassessment, pt stated her head felt better and she was ready to go home. UA largely normal, wet prep normal, GC/CT pending.  ? ?Assessment: ?1. Back pain affecting pregnancy in third trimester   ?2. Round ligament pain   ?3. Chronic migraine without aura without status migrainosus, not intractable   ?4. NST (non-stress test) reactive   ?5. [redacted] weeks gestation of pregnancy   ? ?Plan: ?Discharge home in stable condition with return precautions ?  ? Follow-up Information   ? ? [facility] Follow up.   ?Specialty: Obstetrics and Gynecology ?Why: as scheduled for ongoing prenatal care ?Contact information: ?[address], [address] ?[place] [address] ?[phone] ? ?   ?  ? ?  ?  ? ?  ?  ?Allergies as of 09/19/2021   ?No Known Allergies ?  ? ?  ?Medication List  ?  ? ?TAKE these medications   ? ?aspirin EC 81 MG tablet ?Take 81 mg by mouth daily. Swallow whole. ?

## 2021-09-27 ENCOUNTER — Encounter: Payer: Self-pay | Admitting: Obstetrics and Gynecology

## 2021-09-27 ENCOUNTER — Ambulatory Visit (INDEPENDENT_AMBULATORY_CARE_PROVIDER_SITE_OTHER): Payer: Medicaid Other | Admitting: Obstetrics and Gynecology

## 2021-09-27 VITALS — BP 111/71 | HR 118 | Wt 185.7 lb

## 2021-09-27 DIAGNOSIS — Z3480 Encounter for supervision of other normal pregnancy, unspecified trimester: Secondary | ICD-10-CM

## 2021-09-27 DIAGNOSIS — D563 Thalassemia minor: Secondary | ICD-10-CM

## 2021-09-27 DIAGNOSIS — Z8619 Personal history of other infectious and parasitic diseases: Secondary | ICD-10-CM

## 2021-09-27 DIAGNOSIS — O09299 Supervision of pregnancy with other poor reproductive or obstetric history, unspecified trimester: Secondary | ICD-10-CM

## 2021-09-27 MED ORDER — VALACYCLOVIR HCL 500 MG PO TABS: 500.0000 mg | ORAL_TABLET | Freq: Two times a day (BID) | ORAL | 6 refills | Status: DC

## 2021-09-27 NOTE — Progress Notes (Signed)
Subjective:  ?Stacey [name] is a 31 y.o. [phone] at [redacted]w[redacted]d being seen today for ongoing prenatal care.  She is currently monitored for the following issues for this low-risk pregnancy and has Major depressive disorder, recurrent, severe without psychotic features ([place]); Alpha thalassemia silent carrier; Migraines; History of shoulder dystocia in prior pregnancy, currently pregnant; Supervision of other normal pregnancy, antepartum; Gastric reflux; History of pre-eclampsia in prior pregnancy, currently pregnant; History of ELISA positive for HSV; and HSV infection on their problem list. ? ?Patient reports general discomforts of pregnancy.  Contractions: Irritability. Vag. Bleeding: None.  Movement: Present. Denies leaking of fluid.  ? ?The following portions of the patient's history were reviewed and updated as appropriate: allergies, current medications, past family history, past medical history, past social history, past surgical history and problem list. Problem list updated. ? ?Objective:  ? ?Vitals:  ? 09/27/21 1542  ?BP: 111/71  ?Pulse: (!) 118  ?Weight: 185 lb 11.2 oz (84.2 kg)  ? ? ?Fetal Status: Fetal Heart Rate (bpm): 145   Movement: Present    ? ?General:  Alert, oriented and cooperative. Patient is in no acute distress.  ?Skin: Skin is warm and dry. No rash noted.   ?Cardiovascular: Normal heart rate noted  ?Respiratory: Normal respiratory effort, no problems with respiration noted  ?Abdomen: Soft, gravid, appropriate for gestational age. Pain/Pressure: Present     ?Pelvic:  Cervical exam deferred        ?Extremities: Normal range of motion.  Edema: None  ?Mental Status: Normal mood and affect. Normal behavior. Normal judgment and thought content.  ? ?Urinalysis:     ? ?Assessment and Plan:  ?Pregnancy: [id number] at [redacted]w[redacted]d ? ?1. Supervision of other normal pregnancy, antepartum ?Stable ?GBS and vaginal cultures next visit ?Growth scan 10/10/21 ? ?2. History of pre-eclampsia in prior pregnancy, currently  pregnant ?No S/Sx at present ? ?3. History of ELISA positive for HSV ?HSV suppression started today ? ?4. Alpha thalassemia silent carrier ?Stable ? ?Preterm labor symptoms and general obstetric precautions including but not limited to vaginal bleeding, contractions, leaking of fluid and fetal movement were reviewed in detail with the patient. ?Please refer to After Visit Summary for other counseling recommendations.  ?Return in about 1 week (around 10/04/2021) for OB visit, face to face, MD only. ? ? ?Chancy Milroy, MD ?

## 2021-09-27 NOTE — Patient Instructions (Signed)

## 2021-09-27 NOTE — Progress Notes (Signed)
Patient presents for ROB. Patient has no concerns today. 

## 2021-10-04 ENCOUNTER — Other Ambulatory Visit (HOSPITAL_COMMUNITY)
Admission: RE | Admit: 2021-10-04 | Discharge: 2021-10-04 | Disposition: A | Discharge status: Home / Self Care | Payer: Medicaid Other | Source: Ambulatory Visit | Attending: Obstetrics and Gynecology | Admitting: Obstetrics and Gynecology

## 2021-10-04 ENCOUNTER — Ambulatory Visit (INDEPENDENT_AMBULATORY_CARE_PROVIDER_SITE_OTHER): Payer: Medicaid Other | Admitting: Obstetrics and Gynecology

## 2021-10-04 ENCOUNTER — Encounter: Payer: Self-pay | Admitting: Obstetrics and Gynecology

## 2021-10-04 VITALS — BP 117/77 | HR 116 | Wt 185.0 lb

## 2021-10-04 DIAGNOSIS — Z3480 Encounter for supervision of other normal pregnancy, unspecified trimester: Secondary | ICD-10-CM | POA: Insufficient documentation

## 2021-10-04 DIAGNOSIS — O09299 Supervision of pregnancy with other poor reproductive or obstetric history, unspecified trimester: Secondary | ICD-10-CM

## 2021-10-04 DIAGNOSIS — D563 Thalassemia minor: Secondary | ICD-10-CM

## 2021-10-04 DIAGNOSIS — Z8619 Personal history of other infectious and parasitic diseases: Secondary | ICD-10-CM

## 2021-10-04 LAB — OB RESULTS CONSOLE GC/CHLAMYDIA: Gonorrhea: NEGATIVE

## 2021-10-04 MED ORDER — BUTALBITAL-APAP-CAFFEINE 50-325-40 MG PO CAPS: 1.0000 | ORAL_CAPSULE | Freq: Four times a day (QID) | ORAL | 3 refills | Status: DC | PRN

## 2021-10-04 MED ORDER — TERCONAZOLE 0.8 % VA CREA: 1.0000 | TOPICAL_CREAM | Freq: Every day | VAGINAL | 0 refills | Status: DC

## 2021-10-04 NOTE — Progress Notes (Signed)
Subjective:  [name] is a 31 y.o. [phone] at [redacted]w[redacted]d being seen today for ongoing prenatal care.  She is currently monitored for the following issues for this low-risk pregnancy and has Major depressive disorder, recurrent, severe without psychotic features (HCC); Alpha thalassemia silent carrier; Migraines; History of shoulder dystocia in prior pregnancy, currently pregnant; Supervision of other normal pregnancy, antepartum; Gastric reflux; History of pre-eclampsia in prior pregnancy, currently pregnant; History of ELISA positive for HSV; and HSV infection on their problem list. ? ?Patient reports general discomforts of pregnancy.  Contractions: Irregular. Vag. Bleeding: None.  Movement: Present. Denies leaking of fluid.  ? ?The following portions of the patient's history were reviewed and updated as appropriate: allergies, current medications, past family history, past medical history, past social history, past surgical history and problem list. Problem list updated. ? ?Objective:  ? ?Vitals:  ? 10/04/21 1553  ?BP: 117/77  ?Pulse: (!) 116  ?Weight: 185 lb (83.9 kg)  ? ? ?Fetal Status: Fetal Heart Rate (bpm): 155   Movement: Present    ? ?General:  Alert, oriented and cooperative. Patient is in no acute distress.  ?Skin: Skin is warm and dry. No rash noted.   ?Cardiovascular: Normal heart rate noted  ?Respiratory: Normal respiratory effort, no problems with respiration noted  ?Abdomen: Soft, gravid, appropriate for gestational age. Pain/Pressure: Present     ?Pelvic:  Cervical exam performed        ?Extremities: Normal range of motion.     ?Mental Status: Normal mood and affect. Normal behavior. Normal judgment and thought content.  ? ?Urinalysis:     ? ?Assessment and Plan:  ?Pregnancy: [id number] at [redacted]w[redacted]d ? ?1. Supervision of other normal pregnancy, antepartum ?Labor precautions ?- Culture, beta strep (group b only) ?- terconazole (TERAZOL 3) 0.8 % vaginal cream; Place 1 applicator vaginally at bedtime. Apply  nightly for three nights.  Dispense: 20 g; Refill: 0 ?- Cervicovaginal ancillary only ? ?2. History of ELISA positive for HSV ?Continue with Valtrex ? ?3. History of shoulder dystocia in prior pregnancy, currently pregnant ?Growth scan next week ? ?4. Alpha thalassemia silent carrier ?Stable ? ?Term labor symptoms and general obstetric precautions including but not limited to vaginal bleeding, contractions, leaking of fluid and fetal movement were reviewed in detail with the patient. ?Please refer to After Visit Summary for other counseling recommendations.  ?Return in about 1 week (around 10/11/2021), or if symptoms worsen or fail to improve, for OB visit, face to face, any provider. ? ? ?Hermina Staggers, MD ?

## 2021-10-04 NOTE — Patient Instructions (Signed)
Vaginal Delivery  Vaginal delivery means that you give birth by pushing your baby out of your birth canal (vagina). Your health care team will help you before, during, and after vaginal delivery. Birth experiences are unique for every woman and every pregnancy, and birth experiences vary depending on where you choose to give birth. What are the risks and benefits? Generally, this is safe. However, problems may occur, including: Bleeding. Infection. Damage to other structures such as vaginal tearing. Allergic reactions to medicines. Despite the risks, benefits of vaginal delivery include less risk of bleeding and infection and a shorter recovery time compared to a Cesarean delivery. Cesarean delivery, or C-section, is the surgical delivery of a baby. What happens when I arrive at the birth center or hospital? Once you are in labor and have been admitted into the hospital or birth center, your health care team may: Review your pregnancy history and any concerns that you have. Talk with you about your birth plan and discuss pain control options. Check your blood pressure, breathing, and heartbeat. Assess your baby's heartbeat. Monitor your uterus for contractions. Check whether your bag of water (amniotic sac) has broken (ruptured). Insert an IV into one of your veins. This may be used to give you fluids and medicines. Monitoring Your health care team may assess your contractions (uterine monitoring) and your baby's heart rate (fetal monitoring). You may need to be monitored: Often, but not continuously (intermittently). All the time or for long periods at a time (continuously). Continuous monitoring may be needed if: You are taking certain medicines, such as medicine to relieve pain or make your contractions stronger. You have pregnancy or labor complications. Monitoring may be done by: Placing a special stethoscope or a handheld monitoring device on your abdomen to check your baby's  heartbeat and to check for contractions. Placing monitors on your abdomen (external monitors) to record your baby's heartbeat and the frequency and length of contractions. Placing monitors inside your uterus through your vagina (internal monitors) to record your baby's heartbeat and the frequency, length, and strength of your contractions. Depending on the type of monitor, it may remain in your uterus or on your baby's head until birth. Telemetry. This is a type of continuous monitoring that can be done with external or internal monitors. Instead of having to stay in bed, you are able to move around. Physical exam Your health care team may perform frequent physical exams. This may include: Checking how and where your baby is positioned in your uterus. Checking your cervix to determine: Whether it is thinning out (effacing). Whether it is opening up (dilating). What happens during labor and delivery?  Normal labor and delivery is divided into the following three stages: Stage 1 This is the longest stage of labor. Throughout this stage, you will feel contractions. Contractions generally feel mild, infrequent, and irregular at first. They get stronger, more frequent, and more regular as you move through this stage. You may have contractions about every 2-3 minutes. This stage ends when your cervix is completely dilated to 4 inches (10 cm) and completely effaced. Stage 2 This stage starts once your cervix is completely effaced and dilated and lasts until the delivery of your baby. This is the stage where you will feel an urge to push your baby out of your vagina. You may feel stretching and burning pain, especially when the widest part of your baby's head passes through the vaginal opening (crowning). Once your baby is delivered, the umbilical cord will be   clamped and cut. Timing of cutting the cord will depend on your wishes, your baby's health, and your health care provider's practices. Your baby  will be placed on your bare chest (skin-to-skin contact) in an upright position and covered with a warm blanket. If you are choosing to breastfeed, watch your baby for feeding cues, like rooting or sucking, and help the baby to your breast for his or her first feeding. Stage 3 This stage starts immediately after the birth of your baby and ends after you deliver the placenta. This stage may take anywhere from 5 to 30 minutes. After your baby has been delivered, you will feel contractions as your body expels the placenta. These contractions also help your uterus get smaller and reduce bleeding. What can I expect after labor and delivery? After labor is over, you and your baby will be assessed closely until you are ready to go home. Your health care team will teach you how to care for yourself and your baby. You and your baby may be encouraged to stay in the same room (rooming in) during your hospital stay. This will help promote early bonding and successful breastfeeding. Your uterus will be checked and massaged regularly (fundal massage). You may continue to receive fluids and medicines through an IV. You will have some soreness and pain in your abdomen, vagina, and the area of skin between your vaginal opening and your anus (perineum). If an incision was made near your vagina (episiotomy) or if you had some vaginal tearing during delivery, cold compresses may be placed on your episiotomy or your tear. This helps to reduce pain and swelling. It is normal to have vaginal bleeding after delivery. Wear a sanitary pad for vaginal bleeding and discharge. Summary Vaginal delivery means that you will give birth by pushing your baby out of your birth canal (vagina). Your health care team will monitor you and your baby throughout the stages of labor. After you deliver your baby, your health care team will continue to assess you and your baby to ensure you are both recovering as expected after delivery. This  information is not intended to replace advice given to you by your health care provider. Make sure you discuss any questions you have with your health care provider. Document Revised: 05/02/2020 Document Reviewed: 05/02/2020 Elsevier Patient Education  2023 [employer] Inc[employer]  

## 2021-10-04 NOTE — Progress Notes (Signed)
ROB 36.6 wks ?Irreg UCs ?Reports "yeast infection" ?Has migraine headaches/ has been on fiorcet before pregnancy, requesting ?

## 2021-10-05 LAB — CERVICOVAGINAL ANCILLARY ONLY
Bacterial Vaginitis (gardnerella): NEGATIVE
Candida Glabrata: NEGATIVE
Candida Vaginitis: POSITIVE — AB
Chlamydia: NEGATIVE
Comment: NEGATIVE
Comment: NEGATIVE
Comment: NEGATIVE
Comment: NEGATIVE
Comment: NEGATIVE
Comment: NORMAL
Neisseria Gonorrhea: NEGATIVE
Trichomonas: NEGATIVE

## 2021-10-06 ENCOUNTER — Other Ambulatory Visit: Payer: Self-pay | Admitting: Emergency Medicine

## 2021-10-06 ENCOUNTER — Encounter: Payer: Self-pay | Admitting: Emergency Medicine

## 2021-10-06 NOTE — Progress Notes (Signed)
Mychart message sent re Results and Rx. ?

## 2021-10-07 LAB — CULTURE, BETA STREP (GROUP B ONLY): Strep Gp B Culture: POSITIVE — AB

## 2021-10-09 ENCOUNTER — Encounter: Payer: Self-pay | Admitting: Obstetrics and Gynecology

## 2021-10-09 DIAGNOSIS — O99820 Streptococcus B carrier state complicating pregnancy: Secondary | ICD-10-CM | POA: Insufficient documentation

## 2021-10-10 ENCOUNTER — Ambulatory Visit: Payer: Medicaid Other | Attending: Obstetrics

## 2021-10-10 ENCOUNTER — Ambulatory Visit: Payer: Medicaid Other | Admitting: *Deleted

## 2021-10-10 VITALS — BP 119/66 | HR 109

## 2021-10-10 DIAGNOSIS — O09299 Supervision of pregnancy with other poor reproductive or obstetric history, unspecified trimester: Secondary | ICD-10-CM | POA: Diagnosis not present

## 2021-10-10 DIAGNOSIS — Z3A37 37 weeks gestation of pregnancy: Secondary | ICD-10-CM

## 2021-10-10 DIAGNOSIS — O285 Abnormal chromosomal and genetic finding on antenatal screening of mother: Secondary | ICD-10-CM | POA: Diagnosis not present

## 2021-10-10 DIAGNOSIS — Z362 Encounter for other antenatal screening follow-up: Secondary | ICD-10-CM | POA: Diagnosis not present

## 2021-10-10 DIAGNOSIS — O09293 Supervision of pregnancy with other poor reproductive or obstetric history, third trimester: Secondary | ICD-10-CM

## 2021-10-10 DIAGNOSIS — D563 Thalassemia minor: Secondary | ICD-10-CM | POA: Diagnosis not present

## 2021-10-12 ENCOUNTER — Ambulatory Visit (INDEPENDENT_AMBULATORY_CARE_PROVIDER_SITE_OTHER): Payer: Medicaid Other

## 2021-10-12 VITALS — BP 119/71 | HR 106 | Temp 99.4°F | Wt 188.0 lb

## 2021-10-12 DIAGNOSIS — Z3480 Encounter for supervision of other normal pregnancy, unspecified trimester: Secondary | ICD-10-CM

## 2021-10-12 DIAGNOSIS — Z3A38 38 weeks gestation of pregnancy: Secondary | ICD-10-CM

## 2021-10-12 DIAGNOSIS — Z8759 Personal history of other complications of pregnancy, childbirth and the puerperium: Secondary | ICD-10-CM

## 2021-10-12 NOTE — Progress Notes (Signed)
? ?  LOW-RISK PREGNANCY OFFICE VISIT ? ?Patient name: ALAUNA [name] MRN [id number]  Date of birth: [birth year] ?Chief Complaint:   ?Routine Prenatal Visit ? ?Subjective:   ?[name] is a 31 y.o. [phone] female at [redacted]w[redacted]d with an Estimated Date of Delivery: 10/26/21 being seen today for ongoing management of a low-risk pregnancy aeb has Major depressive disorder, recurrent, severe without psychotic features (HCC); Alpha thalassemia silent carrier; Migraines; History of shoulder dystocia in prior pregnancy, currently pregnant; Supervision of other normal pregnancy, antepartum; Gastric reflux; History of pre-eclampsia in prior pregnancy, currently pregnant; History of ELISA positive for HSV; HSV infection; and GBS (group B Streptococcus carrier), +RV culture, currently pregnant on their problem list. ? ?Patient presents today, alone, with  illness .  She reports onset of cough and congestion yesterday. She denies fever, chills, and sick persons in the household. Patient endorses fetal movement. Patient denies abdominal cramping or contractions.  Patient denies vaginal concerns including abnormal discharge, leaking of fluid, and bleeding. No issues with urination, constipation, or diarrhea.  ? ? Contractions: Irregular. Vag. Bleeding: None.  Movement: Present. ? ?Reviewed past medical,surgical, social, obstetrical and family history as well as problem list, medications and allergies. ? ?Objective  ? ?Vitals:  ? 10/12/21 1534  ?BP: 119/71  ?Pulse: (!) 106  ?Temp: 99.4 ?F (37.4 ?C)  ?Weight: 188 lb (85.3 kg)  ?Body mass index is 36.72 kg/m?.  ?Total Weight Gain:33 lb (15 kg) ? ?  ?     Physical Examination:  ? General appearance: Well appearing, and in no distress ? Mental status: Alert, oriented to person, place, and time ? Skin: Warm & dry ? Cardiovascular: Normal heart rate noted ? Respiratory: Normal respiratory effort, no distress ?Abdomen: Soft, gravid, nontender, AGA with Fundal Height: 40 cm ? Pelvic: Cervical  exam performed  Dilation: Closed Effacement (%): Thick Station: Ballotable Presentation: Vertex ? Extremities: Edema: Trace ? ?Fetal Status: Fetal Heart Rate (bpm): 155  Movement: Present  ? ?No results found for this or any previous visit (from the past 24 hour(s)).  ?Assessment & Plan:  ?Low-risk pregnancy of a 31 y.o., [id number] at [redacted]w[redacted]d with an Estimated Date of Delivery: 10/26/21  ? ?1. Supervision of other normal pregnancy, antepartum ?-Anticipatory guidance for upcoming appts. ?-Patient to schedule next appt in 5-6 weeks for a Postpartum visit. ? ?2. [redacted] weeks gestation of pregnancy ?-Discussed medications for cold treatment. ?-Encouraged rest and hydration. ? ?3. History of shoulder dystocia in prior pregnancy ?-MFM Recommends IOL at 39 weeks. ?-Reviewed recommendation and patient agreeable. ?-Request made for Thursday May 8th in AM ?-Discussed Outpt FB placement on Wednesday and patient declines. ?-Admission and IOL orders placed. ? ? ?  ?Meds: No orders of the defined types were placed in this encounter. ? ?Labs/procedures today:  ?Lab Orders  ?No laboratory test(s) ordered today  ?  ? ?Reviewed: Term labor symptoms and general obstetric precautions including but not limited to vaginal bleeding, contractions, leaking of fluid and fetal movement were reviewed in detail with the patient.  All questions were answered. ? ?Follow-up: No follow-ups on file. ? ?No orders of the defined types were placed in this encounter. ? ?Cherre Robins MSN, CNM ?10/12/2021 ? ?

## 2021-10-12 NOTE — Progress Notes (Signed)
Patient presents for ROB. Patient states that she woke up with a cough and stuffy nose yesterday morning. She states that she does not feel well. Denies having any fever, or body aches. No other concerns. ?

## 2021-10-13 ENCOUNTER — Telehealth (HOSPITAL_COMMUNITY): Payer: Self-pay | Admitting: *Deleted

## 2021-10-13 ENCOUNTER — Encounter (HOSPITAL_COMMUNITY): Payer: Self-pay | Admitting: *Deleted

## 2021-10-13 NOTE — Telephone Encounter (Signed)
Preadmission screen  

## 2021-10-15 ENCOUNTER — Other Ambulatory Visit: Payer: Self-pay | Admitting: Advanced Practice Midwife

## 2021-10-16 DIAGNOSIS — Z419 Encounter for procedure for purposes other than remedying health state, unspecified: Secondary | ICD-10-CM | POA: Diagnosis not present

## 2021-10-19 ENCOUNTER — Encounter (HOSPITAL_COMMUNITY): Payer: Self-pay | Admitting: Family Medicine

## 2021-10-19 ENCOUNTER — Inpatient Hospital Stay (HOSPITAL_COMMUNITY): Payer: Medicaid Other | Admitting: Anesthesiology

## 2021-10-19 ENCOUNTER — Other Ambulatory Visit: Payer: Self-pay

## 2021-10-19 ENCOUNTER — Inpatient Hospital Stay (HOSPITAL_COMMUNITY): Payer: Medicaid Other

## 2021-10-19 ENCOUNTER — Inpatient Hospital Stay (HOSPITAL_COMMUNITY)
Admission: AD | Admit: 2021-10-19 | Discharge: 2021-10-21 | DRG: 806 | Disposition: A | Discharge status: Home / Self Care | Payer: Medicaid Other | Attending: Obstetrics and Gynecology | Admitting: Obstetrics and Gynecology

## 2021-10-19 DIAGNOSIS — O99824 Streptococcus B carrier state complicating childbirth: Secondary | ICD-10-CM | POA: Diagnosis not present

## 2021-10-19 DIAGNOSIS — Z3A39 39 weeks gestation of pregnancy: Secondary | ICD-10-CM

## 2021-10-19 DIAGNOSIS — Z87891 Personal history of nicotine dependence: Secondary | ICD-10-CM | POA: Diagnosis not present

## 2021-10-19 DIAGNOSIS — F332 Major depressive disorder, recurrent severe without psychotic features: Secondary | ICD-10-CM | POA: Diagnosis present

## 2021-10-19 DIAGNOSIS — Z7982 Long term (current) use of aspirin: Secondary | ICD-10-CM

## 2021-10-19 DIAGNOSIS — O99820 Streptococcus B carrier state complicating pregnancy: Secondary | ICD-10-CM | POA: Diagnosis not present

## 2021-10-19 DIAGNOSIS — A6000 Herpesviral infection of urogenital system, unspecified: Secondary | ICD-10-CM | POA: Diagnosis present

## 2021-10-19 DIAGNOSIS — Z23 Encounter for immunization: Secondary | ICD-10-CM | POA: Diagnosis not present

## 2021-10-19 DIAGNOSIS — O26893 Other specified pregnancy related conditions, third trimester: Secondary | ICD-10-CM | POA: Diagnosis not present

## 2021-10-19 DIAGNOSIS — O9832 Other infections with a predominantly sexual mode of transmission complicating childbirth: Secondary | ICD-10-CM | POA: Diagnosis not present

## 2021-10-19 DIAGNOSIS — Z3480 Encounter for supervision of other normal pregnancy, unspecified trimester: Secondary | ICD-10-CM

## 2021-10-19 DIAGNOSIS — D563 Thalassemia minor: Secondary | ICD-10-CM | POA: Diagnosis present

## 2021-10-19 LAB — CBC
HCT: 29.1 % — ABNORMAL LOW (ref 36.0–46.0)
Hemoglobin: 9.8 g/dL — ABNORMAL LOW (ref 12.0–15.0)
MCH: 26.6 pg (ref 26.0–34.0)
MCHC: 33.7 g/dL (ref 30.0–36.0)
MCV: 79.1 fL — ABNORMAL LOW (ref 80.0–100.0)
Platelets: 341 10*3/uL (ref 150–400)
RBC: 3.68 MIL/uL — ABNORMAL LOW (ref 3.87–5.11)
RDW: 15 % (ref 11.5–15.5)
WBC: 9.2 10*3/uL (ref 4.0–10.5)
nRBC: 0 % (ref 0.0–0.2)

## 2021-10-19 LAB — RPR: RPR Ser Ql: NONREACTIVE

## 2021-10-19 LAB — TYPE AND SCREEN
ABO/RH(D): A POS
Antibody Screen: NEGATIVE

## 2021-10-19 MED ORDER — TETANUS-DIPHTH-ACELL PERTUSSIS 5-2.5-18.5 LF-MCG/0.5 IM SUSY
0.5000 mL | PREFILLED_SYRINGE | Freq: Once | INTRAMUSCULAR | Status: AC
  Administered 2021-10-21: 0.5 mL via INTRAMUSCULAR
  Filled 2021-10-19: qty 0.5

## 2021-10-19 MED ORDER — PHENYLEPHRINE 80 MCG/ML (10ML) SYRINGE FOR IV PUSH (FOR BLOOD PRESSURE SUPPORT): 80.0000 ug | PREFILLED_SYRINGE | INTRAVENOUS | Status: DC | PRN

## 2021-10-19 MED ORDER — PENICILLIN G POT IN DEXTROSE 60000 UNIT/ML IV SOLN
3.0000 10*6.[IU] | INTRAVENOUS | Status: DC
  Administered 2021-10-19 (×3): 3 10*6.[IU] via INTRAVENOUS
  Filled 2021-10-19 (×3): qty 50

## 2021-10-19 MED ORDER — BISACODYL 10 MG RE SUPP: 10.0000 mg | Freq: Every day | RECTAL | Status: DC | PRN

## 2021-10-19 MED ORDER — SOD CITRATE-CITRIC ACID 500-334 MG/5ML PO SOLN: 30.0000 mL | ORAL | Status: DC | PRN

## 2021-10-19 MED ORDER — LACTATED RINGERS IV SOLN: INTRAVENOUS | Status: DC

## 2021-10-19 MED ORDER — OXYTOCIN-SODIUM CHLORIDE 30-0.9 UT/500ML-% IV SOLN
1.0000 m[IU]/min | INTRAVENOUS | Status: DC
  Administered 2021-10-19: 2 m[IU]/min via INTRAVENOUS

## 2021-10-19 MED ORDER — PHENYLEPHRINE 80 MCG/ML (10ML) SYRINGE FOR IV PUSH (FOR BLOOD PRESSURE SUPPORT)
80.0000 ug | PREFILLED_SYRINGE | INTRAVENOUS | Status: DC | PRN
  Filled 2021-10-19: qty 10

## 2021-10-19 MED ORDER — PRENATAL MULTIVITAMIN CH
1.0000 | ORAL_TABLET | Freq: Every day | ORAL | Status: DC
  Administered 2021-10-20 – 2021-10-21 (×2): 1 via ORAL
  Filled 2021-10-19 (×2): qty 1

## 2021-10-19 MED ORDER — ONDANSETRON HCL 4 MG/2ML IJ SOLN: 4.0000 mg | Freq: Four times a day (QID) | INTRAMUSCULAR | Status: DC | PRN

## 2021-10-19 MED ORDER — MEDROXYPROGESTERONE ACETATE 150 MG/ML IM SUSP
150.0000 mg | INTRAMUSCULAR | Status: DC | PRN
Start: 2021-10-19 — End: 2021-10-21

## 2021-10-19 MED ORDER — ONDANSETRON HCL 4 MG/2ML IJ SOLN: 4.0000 mg | INTRAMUSCULAR | Status: DC | PRN

## 2021-10-19 MED ORDER — EPHEDRINE 5 MG/ML INJ: 10.0000 mg | INTRAVENOUS | Status: DC | PRN

## 2021-10-19 MED ORDER — WITCH HAZEL-GLYCERIN EX PADS: 1.0000 "application " | MEDICATED_PAD | CUTANEOUS | Status: DC | PRN

## 2021-10-19 MED ORDER — SODIUM CHLORIDE 0.9 % IV SOLN
5.0000 10*6.[IU] | Freq: Once | INTRAVENOUS | Status: AC
  Administered 2021-10-19: 5 10*6.[IU] via INTRAVENOUS
  Filled 2021-10-19: qty 5

## 2021-10-19 MED ORDER — FERROUS SULFATE 325 (65 FE) MG PO TABS
325.0000 mg | ORAL_TABLET | ORAL | Status: DC
  Administered 2021-10-20: 325 mg via ORAL
  Filled 2021-10-19: qty 1

## 2021-10-19 MED ORDER — ONDANSETRON HCL 4 MG PO TABS: 4.0000 mg | ORAL_TABLET | ORAL | Status: DC | PRN

## 2021-10-19 MED ORDER — ACETAMINOPHEN 325 MG PO TABS
650.0000 mg | ORAL_TABLET | ORAL | Status: DC | PRN
  Administered 2021-10-20 (×2): 650 mg via ORAL
  Filled 2021-10-19 (×3): qty 2

## 2021-10-19 MED ORDER — BENZOCAINE-MENTHOL 20-0.5 % EX AERO
1.0000 "application " | INHALATION_SPRAY | CUTANEOUS | Status: DC | PRN
  Administered 2021-10-20: 1 via TOPICAL
  Filled 2021-10-19: qty 56

## 2021-10-19 MED ORDER — DIPHENHYDRAMINE HCL 25 MG PO CAPS: 25.0000 mg | ORAL_CAPSULE | Freq: Four times a day (QID) | ORAL | Status: DC | PRN

## 2021-10-19 MED ORDER — FLEET ENEMA 7-19 GM/118ML RE ENEM: 1.0000 | ENEMA | Freq: Every day | RECTAL | Status: DC | PRN

## 2021-10-19 MED ORDER — LACTATED RINGERS IV SOLN: 500.0000 mL | Freq: Once | INTRAVENOUS | Status: DC

## 2021-10-19 MED ORDER — TERBUTALINE SULFATE 1 MG/ML IJ SOLN: 0.2500 mg | Freq: Once | INTRAMUSCULAR | Status: DC | PRN

## 2021-10-19 MED ORDER — DIPHENHYDRAMINE HCL 50 MG/ML IJ SOLN
12.5000 mg | INTRAMUSCULAR | Status: DC | PRN
  Administered 2021-10-19 (×2): 12.5 mg via INTRAVENOUS
  Filled 2021-10-19: qty 1

## 2021-10-19 MED ORDER — LACTATED RINGERS IV SOLN: 500.0000 mL | INTRAVENOUS | Status: DC | PRN

## 2021-10-19 MED ORDER — ACETAMINOPHEN 325 MG PO TABS: 650.0000 mg | ORAL_TABLET | ORAL | Status: DC | PRN

## 2021-10-19 MED ORDER — IBUPROFEN 600 MG PO TABS
600.0000 mg | ORAL_TABLET | Freq: Four times a day (QID) | ORAL | Status: DC
  Administered 2021-10-20 – 2021-10-21 (×7): 600 mg via ORAL
  Filled 2021-10-19 (×7): qty 1

## 2021-10-19 MED ORDER — DIBUCAINE (PERIANAL) 1 % EX OINT: 1.0000 "application " | TOPICAL_OINTMENT | CUTANEOUS | Status: DC | PRN

## 2021-10-19 MED ORDER — MISOPROSTOL 50MCG HALF TABLET
50.0000 ug | ORAL_TABLET | ORAL | Status: DC | PRN
  Administered 2021-10-19: 50 ug via BUCCAL
  Filled 2021-10-19: qty 1

## 2021-10-19 MED ORDER — METHYLERGONOVINE MALEATE 0.2 MG PO TABS: 0.2000 mg | ORAL_TABLET | ORAL | Status: DC | PRN

## 2021-10-19 MED ORDER — MISOPROSTOL 25 MCG QUARTER TABLET: 25.0000 ug | ORAL_TABLET | ORAL | Status: DC | PRN

## 2021-10-19 MED ORDER — SIMETHICONE 80 MG PO CHEW: 80.0000 mg | CHEWABLE_TABLET | ORAL | Status: DC | PRN

## 2021-10-19 MED ORDER — FENTANYL CITRATE (PF) 100 MCG/2ML IJ SOLN: 50.0000 ug | INTRAMUSCULAR | Status: DC | PRN

## 2021-10-19 MED ORDER — OXYTOCIN BOLUS FROM INFUSION
333.0000 mL | Freq: Once | INTRAVENOUS | Status: AC
  Administered 2021-10-19: 333 mL via INTRAVENOUS

## 2021-10-19 MED ORDER — FENTANYL-BUPIVACAINE-NACL 0.5-0.125-0.9 MG/250ML-% EP SOLN
12.0000 mL/h | EPIDURAL | Status: DC | PRN
  Administered 2021-10-19: 12 mL/h via EPIDURAL
  Filled 2021-10-19: qty 250

## 2021-10-19 MED ORDER — METHYLERGONOVINE MALEATE 0.2 MG/ML IJ SOLN: 0.2000 mg | INTRAMUSCULAR | Status: DC | PRN

## 2021-10-19 MED ORDER — LIDOCAINE HCL (PF) 1 % IJ SOLN
INTRAMUSCULAR | Status: DC | PRN
  Administered 2021-10-19 (×2): 4 mL via EPIDURAL

## 2021-10-19 MED ORDER — COCONUT OIL OIL
1.0000 | TOPICAL_OIL | Status: DC | PRN
Start: 2021-10-19 — End: 2021-10-21

## 2021-10-19 MED ORDER — MEASLES, MUMPS & RUBELLA VAC IJ SOLR: 0.5000 mL | Freq: Once | INTRAMUSCULAR | Status: DC

## 2021-10-19 MED ORDER — LIDOCAINE HCL (PF) 1 % IJ SOLN: 30.0000 mL | INTRAMUSCULAR | Status: DC | PRN

## 2021-10-19 MED ORDER — DOCUSATE SODIUM 100 MG PO CAPS
100.0000 mg | ORAL_CAPSULE | Freq: Two times a day (BID) | ORAL | Status: DC
  Administered 2021-10-20 – 2021-10-21 (×4): 100 mg via ORAL
  Filled 2021-10-19 (×4): qty 1

## 2021-10-19 MED ORDER — OXYTOCIN-SODIUM CHLORIDE 30-0.9 UT/500ML-% IV SOLN
2.5000 [IU]/h | INTRAVENOUS | Status: DC
  Administered 2021-10-19: 2.5 [IU]/h via INTRAVENOUS
  Filled 2021-10-19: qty 500

## 2021-10-19 NOTE — Progress Notes (Signed)
Labor Progress Note ?Stacey [name] is a 32 y.o. [phone] at [redacted]w[redacted]d presented for IOL 2/2 hx of shoulder dystocia x 2 ? ?S: Patient doing well, she got her epidural and is feeling great, has good control of her legs and only feeling mild pressure  ? ?O:  ?BP 130/73   Pulse 89   Temp 97.8 ?F (36.6 ?C) (Oral)   Resp (P) 18   Ht 5' (1.524 m)   Wt 86 kg   LMP 01/14/2021   SpO2 (P) 100%   BMI 37.03 kg/m?  ?EFM: 145/moderate variability/+ accels, no decels  ? ?CVE: Dilation: 3.5 ?Effacement (%): 80, 90 ?Station: -3 ?Presentation: Vertex ?Exam by:: Dr. ? ? ?A&P: 31 y.o. [id number] [redacted]w[redacted]d  ? ?#Labor: Progressing well. Will start patient on Pitocin ?#Pain: Epidural placed  ?#FWB: Cat 1 ?#GBS positive ? ?#Hx shoulder dystocia ?Discussed risks of another shoulder dystocia, outcomes of a dystocia, events that would take place if a dystocia were to happen.   ? ?Noralee Stain, DO, PGY-1 ?2:34 PM  ?

## 2021-10-19 NOTE — Discharge Summary (Addendum)
? ?  Postpartum Discharge Summary ? ?Patient Name: Stacey [name] ?DOB: 1991-03-10 ?MRN: [id number] ? ?Date of admission: 10/19/2021 ?Delivery date:10/19/2021  ?Delivering provider: Christin Fudge  ?Date of discharge: 10/21/2021 ? ?Admitting diagnosis: Indication for care or intervention in labor or delivery [O75.9] ?Intrauterine pregnancy: [redacted]w[redacted]d    ?Secondary diagnosis:  Principal Problem: ?  Indication for care or intervention in labor or delivery ?Active Problems: ?  Major depressive disorder, recurrent, severe without psychotic features ([place] ?  Alpha thalassemia silent carrier ?  Supervision of other normal pregnancy, antepartum ? ?Additional problems: Hx shoulder dystocia X2    ?Discharge diagnosis: Term Pregnancy Delivered                                              ?Post partum procedures: Nexplanon ?Augmentation: AROM, Pitocin, and Cytotec ?Complications: None ? ?Hospital course: Induction of Labor With Vaginal Delivery   ?31y.o. yo [id number] admitted to the hospital 10/19/2021 for induction of labor.  Indication for induction: Elective.  Patient had an uncomplicated labor course as follows: ?Membrane Rupture Time/Date: 5:42 PM ,10/19/2021   ?Delivery Method:Vaginal, Spontaneous  ?Episiotomy: None  ?Lacerations:  None  ?Details of delivery can be found in separate delivery note.  Patient had a routine postpartum course. Patient is discharged home 10/21/21. ? ?Newborn Data: ?Birth date:10/19/2021  ?Birth time:9:01 PM  ?Gender:Female  ?Living status:Living  ?Apgars:9 ,9  ?Weight:3100 g  ? ?Magnesium Sulfate received: No ?BMZ received: No ?Rhophylac:N/A ?MMR:N/A ?T-DaP:Given prenatally ?Flu: No ?Transfusion:No ? ?Physical exam  ?Vitals:  ? 10/20/21 0847 10/20/21 1321 10/20/21 2050 10/21/21 0547  ?BP: 123/78 115/68 127/76 121/85  ?Pulse: 66 90 79 75  ?Resp: 18 18 16 18   ?Temp: 97.8 ?F (36.6 ?C) 98 ?F (36.7 ?C) 99.1 ?F (37.3 ?C) 97.7 ?F (36.5 ?C)  ?TempSrc: Oral Oral Oral Oral  ?SpO2: 100% 100% 100%  99%  ?Weight:      ?Height:      ? ?General: alert ?Lochia: appropriate ?Uterine Fundus: firm ?Incision: N/A ?DVT Evaluation: No evidence of DVT seen on physical exam. ?Labs: ?Lab Results  ?Component Value Date  ? WBC 12.6 (H) 10/20/2021  ? HGB 9.4 (L) 10/20/2021  ? HCT 28.0 (L) 10/20/2021  ? MCV 79.5 (L) 10/20/2021  ? PLT 312 10/20/2021  ? ? ?  Latest Ref Rng & Units 08/31/2021  ?  5:13 PM  ?CMP  ?Glucose 70 - 99 mg/dL 108    ?BUN 6 - 20 mg/dL 3    ?Creatinine 0.57 - 1.00 mg/dL 0.46    ?Sodium 134 - 144 mmol/L 135    ?Potassium 3.5 - 5.2 mmol/L 3.7    ?Chloride 96 - 106 mmol/L 101    ?CO2 20 - 29 mmol/L 18    ?Calcium 8.7 - 10.2 mg/dL 9.5    ?Total Protein 6.0 - 8.5 g/dL 6.5    ?Total Bilirubin 0.0 - 1.2 mg/dL 0.3    ?Alkaline Phos 44 - 121 IU/L 85    ?AST 0 - 40 IU/L 9    ?ALT 0 - 32 IU/L 7    ? ?Edinburgh Score: ? ?  10/20/2021  ?  6:10 PM  ?Edinburgh Postnatal Depression Scale Screening Tool  ?I have been able to laugh and see the funny side of things. 0  ?I have looked forward with enjoyment to things.  0  ?I have blamed myself unnecessarily when things went wrong. 1  ?I have been anxious or worried for no good reason. 0  ?I have felt scared or panicky for no good reason. 1  ?Things have been getting on top of me. 1  ?I have been so unhappy that I have had difficulty sleeping. 0  ?I have felt sad or miserable. 1  ?I have been so unhappy that I have been crying. 0  ?The thought of harming myself has occurred to me. 0  ?Edinburgh Postnatal Depression Scale Total 4  ? ? ? ?After visit meds:  ?Allergies as of 10/21/2021   ?No Known Allergies ?  ? ?  ?Medication List  ?  ? ?STOP taking these medications   ? ?aspirin EC 81 MG tablet ?  ?Blood Pressure Kit Devi ?  ?Butalbital-APAP-Caffeine 50-325-40 MG capsule ?  ?cyclobenzaprine 10 MG tablet ?Commonly known as: FLEXERIL ?  ?Gojji Weight Scale Misc ?  ?valACYclovir 500 MG tablet ?Commonly known as: VALTREX ?  ? ?  ? ?TAKE these medications   ? ?acetaminophen 325 MG  tablet ?Commonly known as: Tylenol ?Take 2 tablets (650 mg total) by mouth every 4 (four) hours as needed (for pain scale < 4). ?  ?ibuprofen 600 MG tablet ?Commonly known as: ADVIL ?Take 1 tablet (600 mg total) by mouth every 6 (six) hours. ?  ?oxyCODONE 5 MG immediate release tablet ?Commonly known as: Oxy IR/ROXICODONE ?Take 1 tablet (5 mg total) by mouth every 4 (four) hours as needed for severe pain. ?  ?Vitafol Ultra 29-0.6-0.4-200 MG Caps ?Take 1 tablet by mouth daily. ?  ? ?  ? ? ? ?Discharge home in stable condition ?Infant Feeding: Breast ?Infant Disposition:home with mother ?Discharge instruction: per After Visit Summary and Postpartum booklet. ?Activity: Advance as tolerated. Pelvic rest for 6 weeks.  ?Diet: routine diet ?Future Appointments: ?Future Appointments  ?Date Time Provider [place]  ?11/21/2021 10:55 AM Chancy Milroy, MD [facility] None  ? ?Follow up Visit: ? ? ?Please schedule this patient for a In person postpartum visit in 4 weeks with the following provider: Any provider. ?Additional Postpartum F/U:  ?Low risk pregnancy complicated by:  ?Delivery mode:  Vaginal, Spontaneous  ?Anticipated Birth Control:  Nexplanon to be placed  in office at postpartum visit. ? ?Renard Matter, MD, MPH ?OB Fellow, Faculty Practice ? ?Attestation of Attending Supervision of Obstetric Fellow: Evaluation and management procedures were performed by the Obstetric Fellow under my supervision and collaboration.  I have reviewed the Obstetric Fellow's note and chart, and I agree with the management and plan. I have also made any necessary editorial changes. ? ? ?Verita Schneiders, MD, FACOG ?Attending [place], Faculty Practice ?[facility] ? ?

## 2021-10-19 NOTE — Progress Notes (Signed)
Noted prolonged deceleration on FHT and went to bedside to evaluate. FHT cat I prior to. Pit at 2.  ? ?Resolved upon arrival to room. Patient reports it resolved with turning onto her left side.  ? ?FHT reassuring, baseline 140/mod var/10x10. No recurrent decel with next 2 contractions afterwards.  ? ?Will monitor closely.  ? ?Allayne Stack, DO  ?

## 2021-10-19 NOTE — Progress Notes (Signed)
Labor Progress Note ?Stacey [name] is a 31 y.o. [phone] at [redacted]w[redacted]d presented for IOL due to previous shoulder dystocia x2.  ? ?S: Doing well, comfortable with epidural  ? ?O:  ?BP 129/72 (BP Location: Left Arm)   Pulse 78   Temp 98.1 ?F (36.7 ?C)   Resp 18   Ht 5' (1.524 m)   Wt 86 kg   LMP 01/14/2021   SpO2 99%   BMI 37.03 kg/m?  ?EFM: 135/mod/10x10/none ? ?CVE: Dilation: 5 ?Effacement (%): 70 ?Station: -3 ?Presentation: Vertex ?Exam by:: Dr. Annia Friendly ? ? ?A&P: 31 y.o. [id number] [redacted]w[redacted]d  ?#Labor: Progressing well. During check, placed pressure onto amniotic membrane with my finger with subsequent ROM. Large amount of light-moderate meconium stained fluid retrieved. Head well applied afterwards. Cont pit. Plan to have NICU at bedside for the above and recurrent history of SD.  ?#Pain: Epidural  ?#FWB: Cat I  ?#GBS positive>adequate PCN received ? ? ?Allayne Stack, DO ?5:51 PM  ?

## 2021-10-19 NOTE — H&P (Signed)
OBSTETRIC ADMISSION HISTORY AND PHYSICAL ? ?Stacey [name] is a 31 y.o. female [phone] with IUP at [redacted]w[redacted]d by 6w [place] presenting for IOL 2/2 hx of shoulder dystocia x 2. She reports +FMs, No LOF, no VB, no blurry vision, headaches or peripheral edema, and RUQ pain.  She plans on Breast feeding. She request Mirena for birth control. ?She received her prenatal care at  [facility]   ? ?Dating: By Doran Heater [place] --->  Estimated Date of Delivery: 10/26/21 ? ?Sono:   ? ?@[redacted]w[redacted]d , CWD, normal anatomy, cephalic presentation, anterior placental lie, 2999g, 33% EFW ? ? ?Prenatal History/Complications:  ?-- hx of shoulder dystocia x 2  ?-- hx of pre-eclampsia in prior pregnancy  ?-- GBS carrier  ?-- alpha thalassemia silent carrier  ?--History of HSV on valtrex, no recent outbreaks  ? ?Past Medical History: ?Past Medical History:  ?Diagnosis Date  ? Alpha thalassemia silent carrier 10/11/2018  ? [x]  Genetic counseling [ ] FOB testing  ? GERD (gastroesophageal reflux disease)   ? 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 On Valtrex-confirm taking prophylaxis > 34 wks  ? HSV-2 infection   ? No hx of outbreak  ? Hx of preeclampsia, prior pregnancy, currently pregnant, third trimester 11/09/2016  ? Postpartum Normal CMP at baseline Took ASA for several weeks but then stopped  ? Kidney infection   ? kidney infection 2012  ? Migraine   ? ? ?Past Surgical History: ?Past Surgical History:  ?Procedure Laterality Date  ? NO PAST SURGERIES    ? ? ?Obstetrical History: ?OB History   ? ? Gravida  ?7  ? Para  ?3  ? Term  ?3  ? Preterm  ?0  ? AB  ?3  ? Living  ?3  ?  ? ? SAB  ?0  ? IAB  ?2  ? Ectopic  ?0  ? Multiple  ?0  ? Live Births  ?3  ?   ?  ?  ? ? ?Social History ?Social History  ? ?Socioeconomic History  ? Marital status: Single  ?  Spouse name: Not on file  ? Number of children: 2  ? Years of education: 36  ? Highest education level: High school graduate  ?Occupational History  ? Occupation: customer service  ?Tobacco Use  ? Smoking status: Former  ?   Types: Cigars  ?  Quit date: 10/05/2021  ?  Years since quitting: 0.0  ? Smokeless tobacco: Never  ? Tobacco comments:  ?  black and mild - one every day  ?Vaping Use  ? Vaping Use: Never used  ?Substance and Sexual Activity  ? Alcohol use: Not Currently  ?  Comment: occ  ? Drug use: No  ? Sexual activity: Yes  ?  Birth control/protection: None  ?  Comment: last IC-2 days ago   ?Other Topics Concern  ? Not on file  ?Social History Narrative  ? Lives at home with her children.  ? Right-handed.  ? Caffeine use: 2 cups per day.  ? ?Social Determinants of Health  ? ?Financial Resource Strain: Not on file  ?Food Insecurity: Not on file  ?Transportation Needs: Not on file  ?Physical Activity: Not on file  ?Stress: Not on file  ?Social Connections: Not on file  ? ? ?Family History: ?Family History  ?Problem Relation Age of Onset  ? Hypertension Mother   ? Migraines Mother   ? Asthma Brother   ? Arthritis Maternal Grandmother   ? Cancer Maternal Grandmother   ? Arthritis  Paternal Grandmother   ? Healthy Father   ? Anesthesia problems Neg Hx   ? ? ?Allergies: ?No Known Allergies ? ?Medications Prior to Admission  ?Medication Sig Dispense Refill Last Dose  ? aspirin EC 81 MG tablet Take 81 mg by mouth daily. Swallow whole.     ? Blood Pressure Monitoring (BLOOD PRESSURE KIT) DEVI 1 kit by Does not apply route as needed. 1 each 0   ? Butalbital-APAP-Caffeine 50-325-40 MG capsule Take 1-2 capsules by mouth every 6 (six) hours as needed for headache. (Patient not taking: Reported on 10/10/2021) 30 capsule 3   ? cyclobenzaprine (FLEXERIL) 10 MG tablet Take 1 tablet (10 mg total) by mouth every 8 (eight) hours as needed for muscle spasms. 30 tablet 1   ? Misc. Devices (GOJJI WEIGHT SCALE) MISC 1 Device by Does not apply route as needed. 1 each 0   ? Prenat-Fe Poly-Methfol-FA-DHA (VITAFOL ULTRA) 29-0.6-0.4-200 MG CAPS Take 1 tablet by mouth daily. 30 capsule 12   ? valACYclovir (VALTREX) 500 MG tablet Take 1 tablet (500 mg total) by  mouth 2 (two) times daily. 60 tablet 6   ? ? ? ?Review of Systems  ? ?All systems reviewed and negative except as stated in HPI ? ?Blood pressure 127/84, pulse 84, temperature 98.1 ?F (36.7 ?C), temperature source Oral, resp. rate 18, height 5' (1.524 m), weight 86 kg, last menstrual period 01/14/2021, currently breastfeeding. ?General appearance: alert, cooperative, and no distress ?Lungs: Normal WOB  ?Heart: regular rate  ?Abdomen: soft, non-tender ?Extremities: Homans sign is negative, no sign of DVT ?Presentation: cephalic ?Fetal monitoringBaseline: 145 bpm, Variability: Good {> 6 bpm), and Accelerations: Reactive ?Uterine activityFrequency: None ?Dilation: 1.5 ?Effacement (%): Thick ?Station: -3 ?Exam by:: Margarite Gouge, RN ? ? ?Prenatal labs: ?ABO, Rh: --/--/A POS (05/04 0750) ?Antibody: NEG (05/04 0750) ?Rubella: 2.38 (10/31 1642) ?RPR: Non Reactive (02/15 1056)  ?HBsAg: Negative (10/31 1642)  ?HIV: Non Reactive (02/15 1056)  ?GBS: Positive/-- (04/19 1631)  ?GTT normal ?Genetic screening  NIPS: Low risk, AFP: negative ?Anatomy US Normal ? ?Prenatal Transfer Tool  ?Maternal Diabetes: No ?Genetic Screening: Normal ?Maternal Ultrasounds/Referrals: Other:Hx of shoulder dystocia  ?Fetal Ultrasounds or other Referrals:  Referred to [facility]  ?Maternal Substance Abuse:  No ?Significant Maternal Medications:  None ?Significant Maternal Lab Results: Group B Strep positive ? ?Results for orders placed or performed during the hospital encounter of 10/19/21 (from the past 24 hour(s))  ?CBC  ? Collection Time: 10/19/21  7:50 AM  ?Result Value Ref Range  ? WBC 9.2 4.0 - 10.5 K/uL  ? RBC 3.68 (L) 3.87 - 5.11 MIL/uL  ? Hemoglobin 9.8 (L) 12.0 - 15.0 g/dL  ? HCT 29.1 (L) 36.0 - 46.0 %  ? MCV 79.1 (L) 80.0 - 100.0 fL  ? MCH 26.6 26.0 - 34.0 pg  ? MCHC 33.7 30.0 - 36.0 g/dL  ? RDW 15.0 11.5 - 15.5 %  ? Platelets 341 150 - 400 K/uL  ? nRBC 0.0 0.0 - 0.2 %  ?Type and screen  ? Collection Time: 10/19/21  7:50 AM   ?Result Value Ref Range  ? ABO/RH(D) A POS   ? Antibody Screen NEG   ? Sample Expiration    ?  10/22/2021,2359 ?Performed at [facility], [place] [address] [place], [place] [address] ?  ? ? ?Patient Active Problem List  ? Diagnosis Date Noted  ? Indication for care or intervention in labor or delivery 10/19/2021  ? GBS (group  B Streptococcus carrier), +RV culture, currently pregnant 10/09/2021  ? HSV infection 09/13/2021  ? History of pre-eclampsia in prior pregnancy, currently pregnant 08/02/2021  ? History of ELISA positive for HSV 08/02/2021  ? Gastric reflux 07/12/2021  ? Supervision of other normal pregnancy, antepartum 04/03/2021  ? History of shoulder dystocia in prior pregnancy, currently pregnant 04/12/2019  ? Migraines 03/02/2019  ? Alpha thalassemia silent carrier 10/11/2018  ? Major depressive disorder, recurrent, severe without psychotic features ([place])   ? ? ?Assessment/Plan:  ?Stacey [name] is a 31 y.o. [phone] at 13w0dhere forIOL 2/2 to hx of shoulder dystocia x 2 ? ?#Labor: Patient not in active labor, start with buccal cytotec and recheck patient in 4 hours, based on check can give additional dose of cytotec, start pitocin or place a foley balloon which the patient is open to.  ?#Pain: PRN, would like an epidural ?#FWB: Cat 1  ?#ID:  GBS positive, PCN given ?#MOF: Breast ?#MOC: Nexplanon  ?#Circ:  N/A ? ?#History of shoulder dystocia: Occurred with G2 and G3, no shoulder dystocia with her first (3260g). 45 and 50 seconds with 3189 and 3980g infants respectively. No residual deficits with both children and did not require prolonged hospitalization (went skin to skin after initial efforts). Current EFW 33% (2999) at 365w5dwhich is projecting to be around 3200g. Discussed shoulder dystocia in detail including risk of neurologic damage, fetal fractures, additional resuscitation/care, and in most severe cases death. She is aware of her option for C/S to definitively avoid SD but would like to  proceed with IOL as scheduled.   ? ?#HSV: no lesions on exam or recently reported. S/p prophylactic valtrex.  ? ?JeNoralee StainDO PGY-1 ?10/19/2021, 9:12 AM ? ? ?GME ATTESTATION:  ?I sa

## 2021-10-19 NOTE — Progress Notes (Addendum)
Stacey [name] is a 31 y.o. [phone] at [redacted]w[redacted]d by ultrasound admitted for induction of labor due to a prior history of 2 shoulder dystocia's. ? ?Subjective: ?Patient is doing well and resting comfortably. She is joined by supportive family in room. Epidural for pain control.  ? ?Objective: ?BP 117/72   Pulse 85   Temp 98.1 ?F (36.7 ?C)   Resp 18   Ht 5' (1.524 m)   Wt 86 kg   LMP 01/14/2021   SpO2 99%   BMI 37.03 kg/m?  ?No intake/output data recorded. ?No intake/output data recorded. ? ? ?SVE:   Dilation: 6 ?Effacement (%): 70 ?Station: -2 ?Exam by:: Blenda Mounts, RN ? ?Labs: ?Lab Results  ?Component Value Date  ? WBC 9.2 10/19/2021  ? HGB 9.8 (L) 10/19/2021  ? HCT 29.1 (L) 10/19/2021  ? MCV 79.1 (L) 10/19/2021  ? PLT 341 10/19/2021  ? ? ?Assessment / Plan: ?Induction of labor due to shoulder dystocia X2.  ? ?Labor: Progressing normally on pitocin ?Fetal Wellbeing:  Category I ?Pain Control:  Epidural ?GBS  Positive has received PCN ? ? ?Bevelyn Buckles [facility] PA-S  ?10/19/2021, 8:41 PM ? ? ?

## 2021-10-19 NOTE — Anesthesia Procedure Notes (Signed)
Epidural ?Patient location during procedure: OB ?Start time: 10/19/2021 2:01 PM ?End time: 10/19/2021 2:04 PM ? ?Staffing ?Anesthesiologist: Kaylyn Layer, MD ?Performed: anesthesiologist  ? ?Preanesthetic Checklist ?Completed: patient identified, IV checked, risks and benefits discussed, monitors and equipment checked, pre-op evaluation and timeout performed ? ?Epidural ?Patient position: sitting ?Prep: DuraPrep and site prepped and draped ?Patient monitoring: continuous pulse ox, blood pressure and heart rate ?Approach: midline ?Location: L3-L4 ?Injection technique: LOR air ? ?Needle:  ?Needle type: Tuohy  ?Needle gauge: 17 G ?Needle length: 9 cm ?Needle insertion depth: 6 cm ?Catheter type: closed end flexible ?Catheter size: 19 Gauge ?Catheter at skin depth: 11 cm ?Test dose: negative and Other (1% lidocaine) ? ?Assessment ?Events: blood not aspirated, injection not painful, no injection resistance, no paresthesia and negative IV test ? ?Additional Notes ?Patient identified. Risks, benefits, and alternatives discussed with patient including but not limited to bleeding, infection, nerve damage, paralysis, failed block, incomplete pain control, headache, blood pressure changes, nausea, vomiting, reactions to medication, itching, and postpartum back pain. Confirmed with bedside nurse the patient's most recent platelet count. Confirmed with patient that they are not currently taking any anticoagulation, have any bleeding history, or any family history of bleeding disorders. Patient expressed understanding and wished to proceed. All questions were answered. Sterile technique was used throughout the entire procedure. Please see nursing notes for vital signs.  ? ?Crisp LOR on first pass. Test dose was given through epidural catheter and negative prior to continuing to dose epidural or start infusion. Warning signs of high block given to the patient including shortness of breath, tingling/numbness in hands, complete  motor block, or any concerning symptoms with instructions to call for help. Patient was given instructions on fall risk and not to get out of bed. All questions and concerns addressed with instructions to call with any issues or inadequate analgesia.  Reason for block:procedure for pain ? ? ? ?

## 2021-10-19 NOTE — Anesthesia Preprocedure Evaluation (Signed)
Anesthesia Evaluation  ?Patient identified by MRN, date of birth, ID band ?Patient awake ? ? ? ?Reviewed: ?Allergy & Precautions, Patient's Chart, lab work & pertinent test results ? ?History of Anesthesia Complications ?Negative for: history of anesthetic complications ? ?Airway ?Mallampati: II ? ?TM Distance: >3 FB ?Neck ROM: Full ? ? ? Dental ?no notable dental hx. ? ?  ?Pulmonary ?former smoker,  ?  ?Pulmonary exam normal ? ? ? ? ? ? ? Cardiovascular ?negative cardio ROS ?Normal cardiovascular exam ? ? ?  ?Neuro/Psych ?Depression negative neurological ROS ?   ? GI/Hepatic ?Neg liver ROS, GERD  ,  ?Endo/Other  ?negative endocrine ROS ? Renal/GU ?negative Renal ROS  ?negative genitourinary ?  ?Musculoskeletal ?negative musculoskeletal ROS ?(+)  ? Abdominal ?  ?Peds ? Hematology ?negative hematology ROS ?(+)   ?Anesthesia Other Findings ?Day of surgery medications reviewed with patient. ? Reproductive/Obstetrics ?(+) Pregnancy ? ?  ? ? ? ? ? ? ? ? ? ? ? ? ? ?  ?  ? ? ? ? ? ? ? ? ?Anesthesia Physical ?Anesthesia Plan ? ?ASA: 2 ? ?Anesthesia Plan: Epidural  ? ?Post-op Pain Management:   ? ?Induction:  ? ?PONV Risk Score and Plan: Treatment may vary due to age or medical condition ? ?Airway Management Planned: Natural Airway ? ?Additional Equipment: Fetal Monitoring ? ?Intra-op Plan:  ? ?Post-operative Plan:  ? ?Informed Consent: I have reviewed the patients History and Physical, chart, labs and discussed the procedure including the risks, benefits and alternatives for the proposed anesthesia with the patient or authorized representative who has indicated his/her understanding and acceptance.  ? ? ? ? ? ?Plan Discussed with:  ? ?Anesthesia Plan Comments:   ? ? ? ? ? ? ?Anesthesia Quick Evaluation ? ?

## 2021-10-20 LAB — CBC
HCT: 28 % — ABNORMAL LOW (ref 36.0–46.0)
Hemoglobin: 9.4 g/dL — ABNORMAL LOW (ref 12.0–15.0)
MCH: 26.7 pg (ref 26.0–34.0)
MCHC: 33.6 g/dL (ref 30.0–36.0)
MCV: 79.5 fL — ABNORMAL LOW (ref 80.0–100.0)
Platelets: 312 10*3/uL (ref 150–400)
RBC: 3.52 MIL/uL — ABNORMAL LOW (ref 3.87–5.11)
RDW: 14.9 % (ref 11.5–15.5)
WBC: 12.6 10*3/uL — ABNORMAL HIGH (ref 4.0–10.5)
nRBC: 0 % (ref 0.0–0.2)

## 2021-10-20 MED ORDER — OXYCODONE HCL 5 MG PO TABS
5.0000 mg | ORAL_TABLET | ORAL | Status: DC | PRN
  Administered 2021-10-20 – 2021-10-21 (×5): 5 mg via ORAL
  Filled 2021-10-20 (×5): qty 1

## 2021-10-20 NOTE — Anesthesia Postprocedure Evaluation (Signed)
Anesthesia Post Note ? ?Patient: Stacey [name] ? ?Procedure(s) Performed: AN AD HOC LABOR EPIDURAL ? ?  ? ?Patient location during evaluation: Mother Baby ?Anesthesia Type: Epidural ?Level of consciousness: awake and alert ?Pain management: pain level controlled ?Vital Signs Assessment: post-procedure vital signs reviewed and stable ?Respiratory status: spontaneous breathing, nonlabored ventilation and respiratory function stable ?Cardiovascular status: stable ?Postop Assessment: no headache, no backache, epidural receding, no apparent nausea or vomiting, patient able to bend at knees, adequate PO intake and able to ambulate ?Anesthetic complications: no ? ? ?No notable events documented. ? ?Last Vitals:  ?Vitals:  ? 10/20/21 0018 10/20/21 0415  ?BP: 118/74 113/72  ?Pulse: 79 81  ?Resp: 17 16  ?Temp: (!) 36.4 ?C   ?SpO2: 100% 100%  ?  ?Last Pain:  ?Vitals:  ? 10/20/21 0633  ?TempSrc:   ?PainSc: 3   ? ?Pain Goal: Patients Stated Pain Goal: 1 (10/20/21 0125) ? ?  ?  ?  ?  ?  ?  ?  ? ?Twylia Oka Hristova ? ? ? ? ?

## 2021-10-20 NOTE — Progress Notes (Addendum)
POSTPARTUM PROGRESS NOTE ? ?Post Partum Day 1 ? ?Subjective: ? ?Stacey [name] is a 31 y.o. [id number] s/p SVD at [redacted]w[redacted]d  No acute events overnight.  Pt denies problems with ambulating, voiding or po intake.  She denies nausea or vomiting.  Pain is moderately controlled.  She has had flatus. She has not had bowel movement.  Lochia Minimal.  ? ?Objective: ?Blood pressure 113/72, pulse 81, temperature (!) 97.5 ?F (36.4 ?C), temperature source Oral, resp. rate 16, height 5' (1.524 m), weight 86 kg, last menstrual period 01/14/2021, SpO2 100 %, unknown if currently breastfeeding. ? ?Physical Exam:  ?General: alert, cooperative and no distress ?Chest: no respiratory distress ?Heart:regular rate, distal pulses intact ?Abdomen: soft, nontender,  ?Uterine Fundus: firm, appropriately tender ?DVT Evaluation: No calf swelling or tenderness ?Extremities: Slight non pitting bilateral lower extremity edema ?Skin: warm, dry ? ?Recent Labs  ?  10/19/21 ?0750 10/20/21 ?03128 ?HGB 9.8* 9.4*  ?HCT 29.1* 28.0*  ? ? ?Assessment/Plan: ?Stacey BIELAKis a 31y.o. [id number]/p SVD at 373w0d? ?PPD#1 - Doing well ?Contraception: Nexplanon ?Feeding: Breast ?Dispo: Plan for discharge home to supportive family . ? ? LOS: 1 day  ? ?Lovette, DaShaune PascalStArbuckleA-S Student  ?10/20/2021, 8:29 AM   ? ?I personally saw and evaluated the patient, performing the key elements of the service. I developed and verified the management plan that is described in the resident's/student's note, and I agree with the content with my edits above. VSS, HRR&R, Resp unlabored, Legs neg.  ?FrNigel BertholdCNM ?10/23/2021 ?12:46 PM ?  ? ?

## 2021-10-20 NOTE — Lactation Note (Signed)
This note was copied from a baby's chart. ?Lactation Consultation Note ? ?Patient Name: Stacey [name] ?Today's Date: 10/20/2021 ?  ?Age:30 hours ?Per RN Marcelino Duster) mom declined [facility] services tonight prefers to  be seen tomorrow morning.  ?Maternal Data ?  ? ?Feeding ?  ? ?LATCH Score ?Latch: Grasps breast easily, tongue down, lips flanged, rhythmical sucking. ? ?Audible Swallowing: A few with stimulation ? ?Type of Nipple: Everted at rest and after stimulation ? ?Comfort (Breast/Nipple): Soft / non-tender ? ?Hold (Positioning): No assistance needed to correctly position infant at breast. ? ?LATCH Score: 9 ? ? ?Lactation Tools Discussed/Used ?  ? ?Interventions ?  ? ?Discharge ?  ? ?Consult Status ?  ? ? ? ?Danelle Earthly ?10/20/2021, 12:25 AM ? ? ? ?

## 2021-10-20 NOTE — Lactation Note (Signed)
This note was copied from a baby's chart. ?Lactation Consultation Note ? ?Patient Name: Stacey [name] ?Today's Date: 10/20/2021 ?Reason for consult: Initial assessment ?Age:30 hours ? ?P4, Mother has chosen to breastfeed and formula feed. ?Baby latched with intermittent swallows. ?Discussed basics and provided mother with lactation brochure.  Mother denies questions or concerns.  ? ?Maternal Data ?Has patient been taught Hand Expression?: Yes ?Does the patient have breastfeeding experience prior to this delivery?: Yes ?How long did the patient breastfeed?: 2.5 mos.- 8 mos. ? ?Feeding ?Mother's Current Feeding Choice: Breast Milk and Formula ? ?LATCH Score ?Latch: Grasps breast easily, tongue down, lips flanged, rhythmical sucking. ? ?Audible Swallowing: A few with stimulation ? ?Type of Nipple: Everted at rest and after stimulation ? ?Comfort (Breast/Nipple): Soft / non-tender ? ?Hold (Positioning): No assistance needed to correctly position infant at breast. ? ?LATCH Score: 9 ? ? ?Lactation Tools Discussed/Used ?  ? ?Interventions ?Interventions: Education;LC Services brochure ? ?Discharge ?  ? ?Consult Status ?Consult Status: Follow-up ?Date: 10/21/21 ?Follow-up type: In-patient ? ? ? ?Dahlia Byes Boschen ?10/20/2021, 11:26 AM ? ? ? ?

## 2021-10-20 NOTE — Social Work (Signed)
MOB was referred for history of depression. ? ?* Referral screened out by Clinical Social Worker because none of the following criteria appear to apply: ?~ History of depression during this pregnancy, or of post-partum depression following prior delivery.  ? ?~ Diagnosis of anxiety and/or depression within last 3 years. Per OB records, MOB has not had any problems with depression in several years. Previous pregnancies MOB has not had problems with depression during pregnancy or postpartum.  ?OR ?* MOB's symptoms currently being treated with medication and/or therapy.  ? ?Please contact the Clinical Social Worker if needs arise, by MOB request, or if MOB scores greater than 9/yes to question 10 on Edinburgh Postpartum Depression Screen.  ? ?Cherith Tewell, MSW, LCSW ?[facility]  ?Clinical Social Worker  ?[phone] ?10/20/2021  9:03 AM  ?

## 2021-10-21 MED ORDER — IBUPROFEN 600 MG PO TABS: 600.0000 mg | ORAL_TABLET | Freq: Four times a day (QID) | ORAL | 0 refills | Status: DC

## 2021-10-21 MED ORDER — ACETAMINOPHEN 325 MG PO TABS: 650.0000 mg | ORAL_TABLET | ORAL | 0 refills | Status: DC | PRN

## 2021-10-21 MED ORDER — FLUCONAZOLE 150 MG PO TABS
150.0000 mg | ORAL_TABLET | Freq: Once | ORAL | Status: AC
  Administered 2021-10-21: 150 mg via ORAL
  Filled 2021-10-21: qty 1

## 2021-10-21 MED ORDER — OXYCODONE HCL 5 MG PO TABS: 5.0000 mg | ORAL_TABLET | ORAL | 0 refills | Status: DC | PRN

## 2021-10-21 NOTE — Lactation Note (Signed)
This note was copied from a baby's chart. ?Lactation Consultation Note ?Mom stated the baby is wanting to eat all the time like she isn't getting enough mom asked for formula. ?Reviewed newborn feeding habits of cluster feeding. ?Mom's feeding choice is breast/formula on admission. Mom has just given formula. Encouraged mom to BF first before supplementing w/formula. ?Encouraged to call for assistance. ? ?Patient Name: Stacey [name] ?Today's Date: 10/21/2021 ?Reason for consult: Mother's request;Term ?Age:30 hours ? ?Maternal Data ?  ? ?Feeding ?Mother's Current Feeding Choice: Breast Milk and Formula ? ?LATCH Score ?  ? ?  ? ?  ? ?  ? ?  ? ?  ? ? ?Lactation Tools Discussed/Used ?  ? ?Interventions ?  ? ?Discharge ?  ? ?Consult Status ?Consult Status: Follow-up ?Date: 10/21/21 ?Follow-up type: In-patient ? ? ? ?Charyl Dancer ?10/21/2021, 12:55 AM ? ? ? ?

## 2021-10-21 NOTE — Lactation Note (Signed)
This note was copied from a baby's chart. ?Lactation Consultation Note ? ?Patient Name: Stacey [name] ?Today's Date: 10/21/2021 ?Reason for consult: Follow-up assessment;Term ?Age:30 hours ? ?LC in to room prior to discharge.  Baby is sleeping upon arrival. Mother reports breast and formula feeding. Per mom, baby seems to favor left breast. Reviewed positioning, alignment and neck/support.  ?Mother requests a hand pump. LC checked for stork pump, but unable to qualify.  ?Discussed normal behavior and patterns after 24h, voids and stools as signs good intake, pumping, clusterfeeding, skin to skin. Talked about milk coming into volume and managing engorgement.  ? ?Plan: ?1-Feeding on demand or 8-12 times in 24h period. ?2-Hand express/pump as needed for supplementation ?3-Encouraged maternal rest, hydration and food intake.  ? ?Contact LC as needed for feeds/support/concerns/questions. All questions answered at this time. Reviewed LC brochure.    ? ?Maternal Data ?Has patient been taught Hand Expression?: Yes ? ?Feeding ?Mother's Current Feeding Choice: Breast Milk and Formula ?Nipple Type: Slow - flow ? ?Lactation Tools Discussed/Used ?Tools: Pump;Flanges ?Flange Size: 24 ?Breast pump type: Manual ?Pump Education: Milk Storage;Setup, frequency, and cleaning ?Reason for Pumping: mother's request ?Pumping frequency: as needed ? ?Interventions ?Interventions: Breast feeding basics reviewed;Skin to skin;Hand express;Breast massage;Expressed milk;Position options;Hand pump;Education;LC Services brochure;Ice ? ?Discharge ?Discharge Education: Engorgement and breast care;Warning signs for feeding baby ?Pump: Manual ? ?Consult Status ?Consult Status: Complete ?Date: 10/21/21 ?Follow-up type: Call as needed ? ? ? ?Krimson Massmann A Higuera Ancidey ?10/21/2021, 12:09 PM ? ? ? ?

## 2021-10-23 ENCOUNTER — Telehealth: Payer: Self-pay

## 2021-10-23 NOTE — Telephone Encounter (Signed)
Transition Care Management Follow-up Telephone Call ?Date of discharge and from where: 10/21/2021 from [facility] ?How have you been since you were released from the hospital? Patient stated that she is feeling well and did not have any questions or concerns at this time.  ?Any questions or concerns? No ? ?Items Reviewed: ?Did the pt receive and understand the discharge instructions provided? Yes  ?Medications obtained and verified? Yes  ?Other? No  ?Any new allergies since your discharge? No  ?Dietary orders reviewed? No ?Do you have support at home? Yes  ? ?Functional Questionnaire: (I = Independent and D = Dependent) ?ADLs: I ? ?Bathing/Dressing- I ? ?Meal Prep- I ? ?Eating- I ? ?Maintaining continence- I ? ?Transferring/Ambulation- I ? ?Managing Meds- I ? ? ?Follow up appointments reviewed: ? ?PCP Hospital f/u appt confirmed? No   ?Specialist Hospital f/u appt confirmed? Yes  Scheduled to see Nettie Elm, MD on 11/21/2021 @ 10:55am. ?Are transportation arrangements needed? No  ?If their condition worsens, is the pt aware to call PCP or go to the Emergency Dept.? Yes ?Was the patient provided with contact information for the PCP's office or ED? Yes ?Was to pt encouraged to call back with questions or concerns? Yes ? ?

## 2021-10-25 ENCOUNTER — Telehealth: Payer: Self-pay

## 2021-10-25 MED ORDER — NIFEDIPINE ER OSMOTIC RELEASE 30 MG PO TB24: 30.0000 mg | ORAL_TABLET | Freq: Every day | ORAL | 0 refills | Status: DC

## 2021-10-25 NOTE — Telephone Encounter (Signed)
Attempted to return call, no answer, left vm.

## 2021-10-25 NOTE — Telephone Encounter (Signed)
Returned call and pt stated that home BP has been 150/101, 153/100, occasional headaches that are relieved with Ibuprofen, denies swelling or visual changes. Consulted with in office provider and sent BP rx, pt scheduled to f/u for BP check in 1 week. Advised of abnormal symptoms and report to hospital if they occur. ?

## 2021-10-26 ENCOUNTER — Encounter (HOSPITAL_COMMUNITY): Payer: Self-pay | Admitting: Obstetrics and Gynecology

## 2021-10-26 ENCOUNTER — Inpatient Hospital Stay (HOSPITAL_COMMUNITY)
Admission: AD | Admit: 2021-10-26 | Discharge: 2021-10-26 | Disposition: A | Discharge status: Home / Self Care | Payer: Medicaid Other | Attending: Obstetrics and Gynecology | Admitting: Obstetrics and Gynecology

## 2021-10-26 DIAGNOSIS — R519 Headache, unspecified: Secondary | ICD-10-CM

## 2021-10-26 DIAGNOSIS — O9089 Other complications of the puerperium, not elsewhere classified: Secondary | ICD-10-CM

## 2021-10-26 DIAGNOSIS — O99893 Other specified diseases and conditions complicating puerperium: Secondary | ICD-10-CM | POA: Diagnosis not present

## 2021-10-26 DIAGNOSIS — Z87891 Personal history of nicotine dependence: Secondary | ICD-10-CM | POA: Diagnosis not present

## 2021-10-26 DIAGNOSIS — O165 Unspecified maternal hypertension, complicating the puerperium: Secondary | ICD-10-CM

## 2021-10-26 DIAGNOSIS — R030 Elevated blood-pressure reading, without diagnosis of hypertension: Secondary | ICD-10-CM | POA: Diagnosis not present

## 2021-10-26 LAB — CBC
HCT: 38.7 % (ref 36.0–46.0)
Hemoglobin: 12.8 g/dL (ref 12.0–15.0)
MCH: 26.2 pg (ref 26.0–34.0)
MCHC: 33.1 g/dL (ref 30.0–36.0)
MCV: 79.3 fL — ABNORMAL LOW (ref 80.0–100.0)
Platelets: 482 10*3/uL — ABNORMAL HIGH (ref 150–400)
RBC: 4.88 MIL/uL (ref 3.87–5.11)
RDW: 15.2 % (ref 11.5–15.5)
WBC: 4.3 10*3/uL (ref 4.0–10.5)
nRBC: 0 % (ref 0.0–0.2)

## 2021-10-26 LAB — COMPREHENSIVE METABOLIC PANEL
ALT: 19 U/L (ref 0–44)
AST: 19 U/L (ref 15–41)
Albumin: 3.7 g/dL (ref 3.5–5.0)
Alkaline Phosphatase: 79 U/L (ref 38–126)
Anion gap: 9 (ref 5–15)
BUN: 6 mg/dL (ref 6–20)
CO2: 22 mmol/L (ref 22–32)
Calcium: 9.1 mg/dL (ref 8.9–10.3)
Chloride: 107 mmol/L (ref 98–111)
Creatinine, Ser: 0.6 mg/dL (ref 0.44–1.00)
GFR, Estimated: >60 mL/min (ref >60)
Glucose, Bld: 85 mg/dL (ref 70–99)
Potassium: 3.8 mmol/L (ref 3.5–5.1)
Sodium: 138 mmol/L (ref 135–145)
Total Bilirubin: 0.9 mg/dL (ref 0.3–1.2)
Total Protein: 7.3 g/dL (ref 6.5–8.1)

## 2021-10-26 MED ORDER — KETOROLAC TROMETHAMINE 30 MG/ML IJ SOLN
30.0000 mg | Freq: Once | INTRAMUSCULAR | Status: AC
  Administered 2021-10-26: 30 mg via INTRAVENOUS
  Filled 2021-10-26: qty 1

## 2021-10-26 MED ORDER — BUTALBITAL-APAP-CAFFEINE 50-325-40 MG PO TABS
2.0000 | ORAL_TABLET | Freq: Once | ORAL | Status: AC
  Administered 2021-10-26: 2 via ORAL
  Filled 2021-10-26: qty 2

## 2021-10-26 MED ORDER — CYCLOBENZAPRINE HCL 5 MG PO TABS
5.0000 mg | ORAL_TABLET | Freq: Once | ORAL | Status: AC
  Administered 2021-10-26: 5 mg via ORAL
  Filled 2021-10-26: qty 1

## 2021-10-26 MED ORDER — METOCLOPRAMIDE HCL 5 MG/ML IJ SOLN
10.0000 mg | Freq: Once | INTRAMUSCULAR | Status: AC
  Administered 2021-10-26: 10 mg via INTRAVENOUS
  Filled 2021-10-26: qty 2

## 2021-10-26 MED ORDER — LACTATED RINGERS IV BOLUS
1000.0000 mL | Freq: Once | INTRAVENOUS | Status: AC
  Administered 2021-10-26: 1000 mL via INTRAVENOUS

## 2021-10-26 NOTE — MAU Provider Note (Signed)
?History  ?  ? ?[id number] ? ?Arrival date and time: 10/26/21 1200 ?  ? ?Chief Complaint  ?Patient presents with  ? Hypertension  ? ? ? ?HPI ?Stacey [name] is a 31 y.o. [id number] s/p uncomplicated NSVD on 10/19/2021 with PMHx notable for history of post partum pre-eclampsia, who presents for headache and elevated blood pressures.  ? ?Recent admission for delivery reviewed. Delivery was uncomplicated. No issues with blood pressure during her admission, not discharged on any anti-HTN meds or lasix. ? ?Today reports she has had a headache since yesterday morning ?She has been getting elevated BP readings at home, per review of nursing phone notes as high as 150's/100's ?She reports they subsequently prescribed her Nifedipine 30 mg XL which she started taking yesterday, first dose around 1700 ?She reports her headache started prior to taking this med but has gotten worse since she did ?Has not yet taken today's dose ?Endorses some mild blurry vision ?Denies chest pain, shortness of breath, RUQ pain, lower extremity edema ?Reports hx of postpartum pre-eclampsia issues with her first delivery ? ?--/--/A POS (05/04 0750) ? ?OB History   ? ? Gravida  ?7  ? Para  ?4  ? Term  ?4  ? Preterm  ?0  ? AB  ?3  ? Living  ?4  ?  ? ? SAB  ?0  ? IAB  ?2  ? Ectopic  ?0  ? Multiple  ?0  ? Live Births  ?4  ?   ?  ?  ? ? ?Past Medical History:  ?Diagnosis Date  ? Alpha thalassemia silent carrier 10/11/2018  ? [x]  Genetic counseling [ ] FOB testing  ? GERD (gastroesophageal reflux disease)   ? 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 On Valtrex-confirm taking prophylaxis > 34 wks  ? HSV-2 infection   ? No hx of outbreak  ? Hx of preeclampsia, prior pregnancy, currently pregnant, third trimester 11/09/2016  ? Postpartum Normal CMP at baseline Took ASA for several weeks but then stopped  ? Kidney infection   ? kidney infection 2012  ? Migraine   ? ? ?Past Surgical History:  ?Procedure Laterality Date  ? NO PAST SURGERIES    ? ? ?Family History   ?Problem Relation Age of Onset  ? Hypertension Mother   ? Migraines Mother   ? Asthma Brother   ? Arthritis Maternal Grandmother   ? Cancer Maternal Grandmother   ? Arthritis Paternal Grandmother   ? Healthy Father   ? Anesthesia problems Neg Hx   ? ? ?Social History  ? ?Socioeconomic History  ? Marital status: Single  ?  Spouse name: Not on file  ? Number of children: 2  ? Years of education: 52  ? Highest education level: High school graduate  ?Occupational History  ? Occupation: customer service  ?Tobacco Use  ? Smoking status: Former  ?  Types: Cigars  ?  Quit date: 10/05/2021  ?  Years since quitting: 0.0  ? Smokeless tobacco: Never  ? Tobacco comments:  ?  black and mild - one every day  ?Vaping Use  ? Vaping Use: Never used  ?Substance and Sexual Activity  ? Alcohol use: Not Currently  ?  Comment: occ  ? Drug use: No  ? Sexual activity: Not Currently  ?  Birth control/protection: None  ?Other Topics Concern  ? Not on file  ?Social History Narrative  ? Lives at home with her children.  ? Right-handed.  ? Caffeine use: 2 cups per  day.  ? ?Social Determinants of Health  ? ?Financial Resource Strain: Not on file  ?Food Insecurity: Not on file  ?Transportation Needs: Not on file  ?Physical Activity: Not on file  ?Stress: Not on file  ?Social Connections: Not on file  ?Intimate Partner Violence: Not on file  ? ? ?No Known Allergies ? ?No current facility-administered medications on file prior to encounter.  ? ?Current Outpatient Medications on File Prior to Encounter  ?Medication Sig Dispense Refill  ? ibuprofen (ADVIL) 600 MG tablet Take 1 tablet (600 mg total) by mouth every 6 (six) hours. 30 tablet 0  ? NIFEdipine (PROCARDIA XL) 30 MG 24 hr tablet Take 1 tablet (30 mg total) by mouth daily. 30 tablet 0  ? acetaminophen (TYLENOL) 325 MG tablet Take 2 tablets (650 mg total) by mouth every 4 (four) hours as needed (for pain scale < 4). 60 tablet 0  ? oxyCODONE (OXY IR/ROXICODONE) 5 MG immediate release tablet Take  1 tablet (5 mg total) by mouth every 4 (four) hours as needed for severe pain. 5 tablet 0  ? Prenat-Fe Poly-Methfol-FA-DHA (VITAFOL ULTRA) 29-0.6-0.4-200 MG CAPS Take 1 tablet by mouth daily. 30 capsule 12  ? ? ? ?ROS ?Pertinent positives and negative per HPI, all others reviewed and negative ? ?Physical Exam  ? ?BP (!) 123/95   Pulse 82   Temp 99.4 ?F (37.4 ?C) (Oral)   Resp 17   Ht 5' (1.524 m)   Wt 76.9 kg   SpO2 99%   Breastfeeding Yes   BMI 33.10 kg/m?  ? ?Patient Vitals for the past 24 hrs: ? BP Temp Temp src Pulse Resp SpO2 Height Weight  ?10/26/21 1522 (!) 123/95 -- -- 82 -- -- -- --  ?10/26/21 1435 125/89 -- -- 80 -- -- -- --  ?10/26/21 1415 (!) 136/92 -- -- 85 -- -- -- --  ?10/26/21 1400 135/90 -- -- 83 -- -- -- --  ?10/26/21 1345 126/83 -- -- 83 -- -- -- --  ?10/26/21 1330 123/82 -- -- 85 -- 99 % -- --  ?10/26/21 1315 126/81 -- -- 94 -- 99 % -- --  ?10/26/21 1300 127/84 -- -- (!) 101 -- 99 % -- --  ?10/26/21 1245 129/87 -- -- 96 -- 100 % -- --  ?10/26/21 1230 127/89 -- -- 94 -- 100 % -- --  ?10/26/21 1215 117/86 99.4 ?F (37.4 ?C) Oral 96 17 99 % 5' (1.524 m) 76.9 kg  ? ? ?Physical Exam ?Vitals reviewed.  ?Constitutional:   ?   General: She is not in acute distress. ?   Appearance: She is well-developed. She is not diaphoretic.  ?Eyes:  ?   General: No scleral icterus. ?Pulmonary:  ?   Effort: Pulmonary effort is normal. No respiratory distress.  ?Abdominal:  ?   General: There is no distension.  ?   Palpations: Abdomen is soft.  ?   Tenderness: There is no abdominal tenderness. There is no guarding or rebound.  ?Skin: ?   General: Skin is warm and dry.  ?Neurological:  ?   Mental Status: She is alert.  ?   Coordination: Coordination normal.  ?  ? ?Cervical Exam ?  ? ?Bedside Ultrasound ?Not done ? ?My interpretation: n/a ? ?FHT ?N/a ? ?Labs ?Results for orders placed or performed during the hospital encounter of 10/26/21 (from the past 24 hour(s))  ?CBC     Status: Abnormal  ? Collection Time:  10/26/21  1:12 PM  ?Result  Value Ref Range  ? WBC 4.3 4.0 - 10.5 K/uL  ? RBC 4.88 3.87 - 5.11 MIL/uL  ? Hemoglobin 12.8 12.0 - 15.0 g/dL  ? HCT 38.7 36.0 - 46.0 %  ? MCV 79.3 (L) 80.0 - 100.0 fL  ? MCH 26.2 26.0 - 34.0 pg  ? MCHC 33.1 30.0 - 36.0 g/dL  ? RDW 15.2 11.5 - 15.5 %  ? Platelets 482 (H) 150 - 400 K/uL  ? nRBC 0.0 0.0 - 0.2 %  ?Comprehensive metabolic panel     Status: None  ? Collection Time: 10/26/21  1:12 PM  ?Result Value Ref Range  ? Sodium 138 135 - 145 mmol/L  ? Potassium 3.8 3.5 - 5.1 mmol/L  ? Chloride 107 98 - 111 mmol/L  ? CO2 22 22 - 32 mmol/L  ? Glucose, Bld 85 70 - 99 mg/dL  ? BUN 6 6 - 20 mg/dL  ? Creatinine, Ser 0.60 0.44 - 1.00 mg/dL  ? Calcium 9.1 8.9 - 10.3 mg/dL  ? Total Protein 7.3 6.5 - 8.1 g/dL  ? Albumin 3.7 3.5 - 5.0 g/dL  ? AST 19 15 - 41 U/L  ? ALT 19 0 - 44 U/L  ? Alkaline Phosphatase 79 38 - 126 U/L  ? Total Bilirubin 0.9 0.3 - 1.2 mg/dL  ? GFR, Estimated >60 >60 mL/min  ? Anion gap 9 5 - 15  ? ? ?Imaging ?No results found. ? ?MAU Course  ?Procedures ?Lab Orders    ?     CBC    ?     Comprehensive metabolic panel    ? ?Meds ordered this encounter  ?Medications  ? lactated ringers bolus 1,000 mL  ? ketorolac (TORADOL) 30 MG/ML injection 30 mg  ? metoCLOPramide (REGLAN) injection 10 mg  ? butalbital-acetaminophen-caffeine (FIORICET) 50-325-40 MG per tablet 2 tablet  ? cyclobenzaprine (FLEXERIL) tablet 5 mg  ? ?Imaging Orders  ?No imaging studies ordered today  ? ? ?MDM ?moderate ? ?Assessment and Plan  ?#Headache in postpartum period ?#Postpartum elevated blood pressures ?Headache resolved with above interventions, likely tension headache. BP's normal to mild range in MAU, CBC and CMP unremarkable. Continue Nifedipine, has follow up in one week w [facility].  ? ? ?Dispo: discharged to home in stable condition. ? ? ?Venora MaplesMatthew M Rabia Argote, MD/MPH ?10/26/21 ?3:25 PM ? ?Allergies as of 10/26/2021   ?No Known Allergies ?  ? ?  ?Medication List  ?  ? ?TAKE these medications   ? ?acetaminophen  325 MG tablet ?Commonly known as: Tylenol ?Take 2 tablets (650 mg total) by mouth every 4 (four) hours as needed (for pain scale < 4). ?  ?ibuprofen 600 MG tablet ?Commonly known as: ADVIL ?Take 1 tablet (600 mg

## 2021-10-26 NOTE — Discharge Instructions (Signed)
You were seen in the MAU for a headache. This is likely what we call a tension headache, and is NOT related to your blood pressure problems over the last few days. You should continue to take your blood pressure medication. Make sure you follow up with your OB clinic next week which you already have scheduled.  ?

## 2021-10-26 NOTE — MAU Note (Addendum)
...  Stacey [name] is a 31 y.o. at Unknown here in MAU reporting: Elevated BP's since this past Tuesday. She reports her highest BP was 163/105, which was last night. She reports today her BP this morning was 130/94. She reports she was prescribed Procardia 30 mg and took one dose yesterday around 1730 and has not taken any today. She is endorsing a HA since yesterday morning that has not been relieved with taking 600 mg of Ibuprofen. She reports she is experiencing visual floaters. Denies RUQ/epigastric pain as well as edema. Reflexes plus 2. Absent clonus. ? ?Onset of complaint: Elevated BP's since this past Tuesday. HA since yesterday morning. ? ?Last doses: ?Ibuprofen 600 mg at 0500 ? ?Last ate last night around 2100. ? ?Pain score:  ?8/10 HA - posterior ? ?

## 2021-10-27 ENCOUNTER — Telehealth (HOSPITAL_COMMUNITY): Payer: Self-pay

## 2021-10-27 NOTE — Telephone Encounter (Signed)
"  I've been having blood pressure and headache issues since I had the baby. I actually came to the emergency there yesterday for the headaches and BP. Today I checked my BP and it was 127/90. The headache is back again." Patient declines nausea, seeing spots, blurry vision, stomach pain under the right breast, swelling of hands and face, or a weight gain of more than 5 pounds in a week. RN recommended to patient being evaluated by her OB-GYN again due to still having c/o severe headache and elevated BP. RN told patient she can come here to [facility] MAU and we are happy to see her. Patient has no other concerns or questions about her healing.  ? ?"She's doing good. Her cord hasn't fallen off yet." RN reviewed cord care with patient. "She sleeps in a bassinet." RN reviewed ABC's of safe sleep with patient. Patient declines any questions or concerns about baby. ? ?EPDS score is 1. ? ?Sharyn Lull [place] Goh,RN3,MSN,RNC-MNN ?05/12//2023,1712 ?

## 2021-11-02 ENCOUNTER — Ambulatory Visit: Payer: Medicaid Other

## 2021-11-06 ENCOUNTER — Ambulatory Visit: Payer: Medicaid Other

## 2021-11-07 ENCOUNTER — Ambulatory Visit (INDEPENDENT_AMBULATORY_CARE_PROVIDER_SITE_OTHER): Payer: Medicaid Other

## 2021-11-07 VITALS — BP 118/79 | HR 89 | Ht 60.0 in

## 2021-11-07 DIAGNOSIS — O165 Unspecified maternal hypertension, complicating the puerperium: Secondary | ICD-10-CM

## 2021-11-07 NOTE — Progress Notes (Signed)
Subjective:  Stacey [name] is a 31 y.o. female here for BP check.   Hypertension ROS: taking medications as instructed, no medication side effects noted, no TIA's, no chest pain on exertion, no dyspnea on exertion, and no swelling of ankles.    Objective:  BP 118/79   Pulse 89   Ht 5' (1.524 m)   Breastfeeding Yes   BMI 33.10 kg/m   Appearance alert, well appearing, and in no distress. General exam BP noted to be well controlled today in office.    Assessment:   Blood Pressure well controlled.   Plan:  Current treatment plan is effective, no change in therapy.. Pt will return for postpartum visit on 11/21/2021.

## 2021-11-16 DIAGNOSIS — Z419 Encounter for procedure for purposes other than remedying health state, unspecified: Secondary | ICD-10-CM | POA: Diagnosis not present

## 2021-11-21 ENCOUNTER — Other Ambulatory Visit: Payer: Self-pay | Admitting: Obstetrics & Gynecology

## 2021-11-21 ENCOUNTER — Ambulatory Visit: Payer: Medicaid Other | Admitting: Obstetrics and Gynecology

## 2021-12-12 ENCOUNTER — Telehealth: Payer: Self-pay

## 2021-12-13 ENCOUNTER — Telehealth (INDEPENDENT_AMBULATORY_CARE_PROVIDER_SITE_OTHER): Payer: Medicaid Other | Admitting: Obstetrics and Gynecology

## 2021-12-13 ENCOUNTER — Encounter: Payer: Self-pay | Admitting: Obstetrics and Gynecology

## 2021-12-13 MED ORDER — NORETHINDRONE 0.35 MG PO TABS: 1.0000 | ORAL_TABLET | Freq: Every day | ORAL | 11 refills | Status: DC

## 2021-12-13 NOTE — Progress Notes (Signed)
Provider location: Center for [facility]   Patient location: Home  I connected withNAME@ on 12/13/21 at  8:35 AM EDT by Mychart Video Encounter and verified that I am speaking with the correct person using two identifiers.       I discussed the limitations, risks, security and privacy concerns of performing an evaluation and management service virtually and the availability of in person appointments. I also discussed with the patient that there may be a patient responsible charge related to this service. The patient expressed understanding and agreed to proceed.  Post Partum Visit Note Subjective:   Stacey [name] is a 31 y.o. [phone] female who presents for a postpartum visit. She is 7 weeks postpartum following a normal spontaneous vaginal delivery.  I have fully reviewed the prenatal and intrapartum course. The delivery was at 39 gestational weeks.  Anesthesia: epidural. Postpartum course has been unremarkable. Baby is doing well. Baby is feeding by both breast and bottle - Gerber Soothe Pro . Bleeding no bleeding. Bowel function is normal. Bladder function is normal. Patient is sexually active. Contraception method is none. Postpartum depression screening: negative.   The pregnancy intention screening data noted above was reviewed. Potential methods of contraception were discussed. The patient elected to proceed with No data recorded.   Edinburgh Postnatal Depression Scale - 12/13/21 0840       Edinburgh Postnatal Depression Scale:  In the Past 7 Days   I have been able to laugh and see the funny side of things. 0    I have looked forward with enjoyment to things. 0    I have blamed myself unnecessarily when things went wrong. 0    I have been anxious or worried for no good reason. 0    I have felt scared or panicky for no good reason. 0    Things have been getting on top of me. 1    I have been so unhappy that I have had difficulty sleeping. 0    I have felt sad  or miserable. 0    I have been so unhappy that I have been crying. 0    The thought of harming myself has occurred to me. 0    Edinburgh Postnatal Depression Scale Total 1                Review of Systems Pertinent items noted in HPI and remainder of comprehensive ROS otherwise negative.  Objective:  There were no vitals taken for this visit.    General:  Alert, oriented and cooperative. Patient is in no acute distress.  Respiratory: Normal respiratory effort, no problems with respiration noted  Mental Status: Normal mood and affect. Normal behavior. Normal judgment and thought content.  Rest of physical exam deferred due to type of encounter   Assessment:    Normal  postpartum exam.  Plan:  Essential components of care per ACOG recommendations:  1.  Mood and well being: Patient with negative depression screening today. Reviewed local resources for support.  - Patient does not use tobacco.  - hx of drug use? No    2. Infant care and feeding:  -Patient currently breastmilk feeding? Yes If breastmilk feeding discussed return to work and pumping. If needed, patient was provided letter for work to allow for every 2-3 hr pumping breaks, and to be granted a private location to express breastmilk and refrigerated area to store breastmilk. Reviewed importance of draining breast regularly to support lactation. -Social determinants  of health (SDOH) reviewed in EPIC. No concerns  3. Sexuality, contraception and birth spacing - Patient does not want a pregnancy in the next year.  Desired family size is 4 children.  - Reviewed forms of contraception in tiered fashion. Patient desired oral progesterone-only contraceptive today.   - Discussed birth spacing of 18 months  4. Sleep and fatigue -Encouraged family/partner/community support of 4 hrs of uninterrupted sleep to help with mood and fatigue  5. Physical Recovery  - Discussed patients delivery and complications - Patient had no  laceration, perineal healing reviewed. Patient expressed understanding - Patient has urinary incontinence? No  - Patient is safe to resume physical and sexual activity  6.  Health Maintenance - Last pap smear done 03/2021 and was normal with negative HPV.  7. Chronic Disease Patient discontinued procardia a week ago and is unable to check BP today Will have patient come in for BP check this week - PCP follow up  I provided 15 minutes of face-to-face time during this encounter.    No follow-ups on file.  Future Appointments  Date Time Provider Department Center  12/28/2021  1:30 PM Gerrit Heck, CNM [facility] None    Catalina Antigua, MD Center for [employer], [facility]

## 2021-12-16 DIAGNOSIS — Z419 Encounter for procedure for purposes other than remedying health state, unspecified: Secondary | ICD-10-CM | POA: Diagnosis not present

## 2021-12-28 ENCOUNTER — Ambulatory Visit: Payer: Medicaid Other | Admitting: Certified Nurse Midwife

## 2021-12-31 ENCOUNTER — Encounter (HOSPITAL_BASED_OUTPATIENT_CLINIC_OR_DEPARTMENT_OTHER): Payer: Self-pay | Admitting: Emergency Medicine

## 2021-12-31 ENCOUNTER — Other Ambulatory Visit: Payer: Self-pay

## 2021-12-31 DIAGNOSIS — M542 Cervicalgia: Secondary | ICD-10-CM | POA: Insufficient documentation

## 2021-12-31 DIAGNOSIS — J029 Acute pharyngitis, unspecified: Secondary | ICD-10-CM | POA: Diagnosis not present

## 2021-12-31 DIAGNOSIS — B9789 Other viral agents as the cause of diseases classified elsewhere: Secondary | ICD-10-CM | POA: Diagnosis not present

## 2021-12-31 LAB — GROUP A STREP BY PCR: Group A Strep by PCR: NOT DETECTED

## 2021-12-31 NOTE — ED Triage Notes (Signed)
Pt complains of stiff neck and sore throat x 7 days. Denies any injury.

## 2022-01-01 ENCOUNTER — Emergency Department (HOSPITAL_BASED_OUTPATIENT_CLINIC_OR_DEPARTMENT_OTHER)
Admission: EM | Admit: 2022-01-01 | Discharge: 2022-01-01 | Disposition: A | Discharge status: Home / Self Care | Payer: Medicaid Other | Attending: Emergency Medicine | Admitting: Emergency Medicine

## 2022-01-01 DIAGNOSIS — B9789 Other viral agents as the cause of diseases classified elsewhere: Secondary | ICD-10-CM

## 2022-01-01 MED ORDER — DEXAMETHASONE SODIUM PHOSPHATE 10 MG/ML IJ SOLN
10.0000 mg | Freq: Once | INTRAMUSCULAR | Status: AC
  Administered 2022-01-01: 10 mg via INTRAMUSCULAR
  Filled 2022-01-01: qty 1

## 2022-01-01 NOTE — ED Notes (Signed)
Pt discharged home after verbalizing understanding of discharge instructions; nad noted. 

## 2022-01-01 NOTE — ED Provider Notes (Signed)
[facility] EMERGENCY DEPT Provider Note   CSN: [id number] Arrival date & time: 12/31/21  2118     History  Chief Complaint  Patient presents with   Sore Throat   Torticollis    Stacey [name] is a 31 y.o. female.  HPI     This is a 31 year old female who presents with sore throat and neck pain.  Patient reports that she woke up on Tuesday with sore throat.  She is also experienced some posterior left neck discomfort.  At times it is improved with ibuprofen but it wears off.  It is worse with certain range of motion.  She is unsure whether she slept on it wrong.  Regarding her sore throat, she reports seeing "spots on my throat."  She has not had any fevers.  She does report pain with swallowing.  Pain is mostly in the left side of her throat.  No known sick contacts.  Home Medications Prior to Admission medications   Medication Sig Start Date End Date Taking? Authorizing Provider  acetaminophen (TYLENOL) 325 MG tablet Take 2 tablets (650 mg total) by mouth every 4 (four) hours as needed (for pain scale < 4). 10/21/21   Warner Mccreedy, MD  ibuprofen (ADVIL) 600 MG tablet Take 1 tablet (600 mg total) by mouth every 6 (six) hours. 10/21/21   Warner Mccreedy, MD  NIFEdipine (PROCARDIA XL) 30 MG 24 hr tablet Take 1 tablet (30 mg total) by mouth daily. Patient not taking: Reported on 12/13/2021 10/25/21   Adam Phenix, MD  norethindrone (MICRONOR) 0.35 MG tablet Take 1 tablet (0.35 mg total) by mouth daily. 12/13/21   Constant, Peggy, MD  oxyCODONE (OXY IR/ROXICODONE) 5 MG immediate release tablet Take 1 tablet (5 mg total) by mouth every 4 (four) hours as needed for severe pain. Patient not taking: Reported on 11/07/2021 10/21/21   Warner Mccreedy, MD  Prenat-Fe Poly-Methfol-FA-DHA (VITAFOL ULTRA) 29-0.6-0.4-200 MG CAPS Take 1 tablet by mouth daily. 02/22/21   Leftwich-Kirby, Wilmer Floor, CNM      Allergies    Patient has no known allergies.    Review of Systems   Review of Systems   Constitutional:  Negative for fever.  HENT:  Positive for sore throat.   All other systems reviewed and are negative.   Physical Exam Updated Vital Signs BP (!) 144/105 (BP Location: Right Arm)   Pulse (!) 104   Temp 98.5 F (36.9 C)   Resp 18   Ht 1.524 m (5')   Wt 74.8 kg   LMP 12/22/2021 (Exact Date)   SpO2 99%   BMI 32.22 kg/m  Physical Exam Vitals and nursing note reviewed.  Constitutional:      Appearance: She is well-developed. She is obese. She is not ill-appearing.  HENT:     Head: Normocephalic and atraumatic.     Nose: No congestion.     Mouth/Throat:     Mouth: Mucous membranes are moist. No oral lesions.     Pharynx: Uvula midline. No oropharyngeal exudate.     Comments: Slightly erythematous posterior oropharynx, uvula midline, no tonsillar swelling or exudate Eyes:     Pupils: Pupils are equal, round, and reactive to light.  Neck:     Comments: Normal range of motion of the neck, no meningismus noted, tenderness palpation just left of midline of the upper C-spine, no occipital lymphadenopathy noted Cardiovascular:     Rate and Rhythm: Normal rate and regular rhythm.     Heart sounds: Normal  heart sounds.  Pulmonary:     Effort: Pulmonary effort is normal. No respiratory distress.     Breath sounds: No wheezing.  Abdominal:     General: Bowel sounds are normal.     Palpations: Abdomen is soft.  Musculoskeletal:     Cervical back: Neck supple.  Lymphadenopathy:     Cervical: No cervical adenopathy.  Skin:    General: Skin is warm and dry.  Neurological:     Mental Status: She is alert and oriented to person, place, and time.  Psychiatric:        Mood and Affect: Mood normal.     ED Results / Procedures / Treatments   Labs (all labs ordered are listed, but only abnormal results are displayed) Labs Reviewed  GROUP A STREP BY PCR    EKG None  Radiology No results found.  Procedures Procedures    Medications Ordered in ED Medications   dexamethasone (DECADRON) injection 10 mg (has no administration in time range)    ED Course/ Medical Decision Making/ A&P                           Medical Decision Making Risk Prescription drug management.   This patient presents to the ED for concern of sore throat, neck discomfort, this involves an extensive number of treatment options, and is a complaint that carries with it a high risk of complications and morbidity.  I considered the following differential and admission for this acute, potentially life threatening condition.  The differential diagnosis includes strep throat, viral pharyngitis, neck spasm, less likely meningitis  MDM:    This is a 31 year old female who presents with 5-day history of sore throat and neck discomfort.  She is nontoxic and vital signs are notable for a pulse rate of 104.  She is afebrile.  Her physical exam is fairly benign.  No intraoral findings suggestive of deep space infection.  She has normal range of motion of the neck without stiffness.  Low suspicion for meningitis.  She does have some reproducible muscular tenderness on exam.  Strep testing is negative.  Suspect viral etiology.  Patient was given a dose of Decadron.  Recommend ongoing supportive measures at home.  (Labs, imaging, consults)  Labs: I Ordered, and personally interpreted labs.  The pertinent results include: Strep  Imaging Studies ordered: I ordered imaging studies including none I independently visualized and interpreted imaging. I agree with the radiologist interpretation  Additional history obtained from chart review.  External records from outside source obtained and reviewed including prior evaluation  Cardiac Monitoring: The patient was maintained on a cardiac monitor.  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   On Valtrex-confirm taking prophylaxis > 34 wks   HSV-2 infection    No hx of outbreak   Hx of preeclampsia, prior pregnancy, currently pregnant, third trimester 11/09/2016   Postpartum Normal CMP at baseline Took ASA for several weeks but then stopped   Kidney infection  kidney infection 2012   Migraine      Medicines Meds ordered this encounter  Medications   dexamethasone (DECADRON) injection 10 mg    I have reviewed the patients home medicines and have made adjustments as needed  Problem List / ED Course: Problem List Items Addressed This Visit   None Visit Diagnoses     Sore throat (viral)    -  Primary                   Final Clinical Impression(s) / ED Diagnoses Final diagnoses:  Sore throat (viral)    Rx / DC Orders ED Discharge Orders     None         Berit Raczkowski, Mayer Masker, MD 01/01/22 0104

## 2022-01-01 NOTE — Discharge Instructions (Signed)
You were seen today for sore throat and neck pain.  Your strep testing is negative.  You likely have had some viral pharyngitis.  You were given a dose of steroids.  Continue ibuprofen at home.  Do salt water gargle to help with symptoms.

## 2022-01-08 ENCOUNTER — Telehealth: Payer: Self-pay | Admitting: Obstetrics and Gynecology

## 2022-01-08 NOTE — Patient Outreach (Signed)
Transition Care Management Follow-up Telephone Call Date of discharge and from where: 01/01/22 from [facility] How have you been since you were released from the hospital? Doing better Any questions or concerns? No  Items Reviewed: Did the pt receive and understand the discharge instructions provided? Yes  Medications obtained and verified? Yes  Other? No  Any new allergies since your discharge? No  Dietary orders reviewed? Yes Do you have support at home? Yes   Home Care and Equipment/Supplies: Were home health services ordered? not applicable If so, what is the name of the agency? N/A  Has the agency set up a time to come to the patient's home? not applicable Were any new equipment or medical supplies ordered?  No What is the name of the medical supply agency? N/A Were you able to get the supplies/equipment? not applicable Do you have any questions related to the use of the equipment or supplies? No  Functional Questionnaire: (I = Independent and D = Dependent) ADLs: I  Bathing/Dressing- I  Meal Prep- I  Eating- I  Maintaining continence- I  Transferring/Ambulation- I  Managing Meds- I  Follow up appointments reviewed:  PCP Hospital f/u appt confirmed?  No follow up recommended Specialist Hospital f/u appt confirmed?  No follow up recommended   Are transportation arrangements needed? No  If their condition worsens, is the pt aware to call PCP or go to the Emergency Dept.? No Was the patient provided with contact information for the PCP's office or ED? Yes Was to pt encouraged to call back with questions or concerns? Yes

## 2022-01-16 DIAGNOSIS — Z419 Encounter for procedure for purposes other than remedying health state, unspecified: Secondary | ICD-10-CM | POA: Diagnosis not present

## 2022-01-25 ENCOUNTER — Ambulatory Visit: Payer: Medicaid Other | Admitting: Obstetrics & Gynecology

## 2022-02-16 DIAGNOSIS — Z419 Encounter for procedure for purposes other than remedying health state, unspecified: Secondary | ICD-10-CM | POA: Diagnosis not present

## 2022-03-18 DIAGNOSIS — Z419 Encounter for procedure for purposes other than remedying health state, unspecified: Secondary | ICD-10-CM | POA: Diagnosis not present

## 2022-04-18 DIAGNOSIS — Z419 Encounter for procedure for purposes other than remedying health state, unspecified: Secondary | ICD-10-CM | POA: Diagnosis not present

## 2022-05-18 DIAGNOSIS — Z419 Encounter for procedure for purposes other than remedying health state, unspecified: Secondary | ICD-10-CM | POA: Diagnosis not present

## 2022-06-18 DIAGNOSIS — Z419 Encounter for procedure for purposes other than remedying health state, unspecified: Secondary | ICD-10-CM | POA: Diagnosis not present

## 2022-07-09 NOTE — H&P (Signed)
  Patient: Stacey [name]  PID: [id number]  DOB: [birth year]  SEX: Female   Patient referred by  DDS for extraction teeth 1, 4, 16, 31, 32.  CC: Pain and sensitivity upper and lower right.  Past Medical History:  Snoring, Smoker, High Blood Pressure    Medications: Birth Control    Allergies:     NKDA    Surgeries:   no surgeries     Social History       Smoking: 2 cigar/day           Alcohol:n Drug use:n                             Exam: BMI  29.  No purulence, edema, fluctuance, trismus. Oral cancer screening negative. Pharynx clear. No lymphadenopathy.  Panorex: Erupted #1, super-erupted 2, 3, Deep caries #4. Impacted # 16, 31, 32.   Assessment: ASA 2. Non-restorable teeth #1, 4, Impacted #16, 31, 32.                Plan: Extraction Teeth # 1, 4, 16, 31, 32.   Hospital Day surgery.                 Rx: n               Risks and complications explained. Questions answered.   Gae Bon, DMD

## 2022-07-11 ENCOUNTER — Encounter (HOSPITAL_COMMUNITY): Payer: Self-pay | Admitting: Oral Surgery

## 2022-07-11 ENCOUNTER — Other Ambulatory Visit: Payer: Self-pay

## 2022-07-11 NOTE — Pre-Procedure Instructions (Signed)
PCP - Dr.Garba Cardiologist - pt denies  PPM/ICD - pt denies Device Orders - pt denies Rep Notified - pt denies  EKG - 08/01/2020 Stress Test - pt denies ECHO - pt denies  Cardiac Cath - pt denies  Sleep Study/CPAP - pt denies  Diabetic- pt denies Fasting Blood Sugar -  Checks Blood Sugar _____ times a day  Last dose of GLP1 agonist-  pt denies GLP1 instructions: n/a  Blood Thinner Instructions:pt denies Aspirin Instructions:pt denies  ERAS Protcol - NPO after Midnight  COVID TEST- n/a   Anesthesia review: NO   Patient verbally denies any shortness of breath, fever, cough and chest pain during phone call.     -------------  SDW INSTRUCTIONS given:   Your procedure is scheduled on 07/13/22.             Report to [facility] Main Entrance "A" at  8:30  A.M., and check in at the Admitting office.             Call this number if you have problems the morning of surgery:             [phone]               Remember:             Do not eat or drink after midnight the night before your surgery                          Take these medicines the morning of surgery with A SIP OF WATER  Tylenol-prn  As of today, STOP taking any Aspirin (unless otherwise instructed by your surgeon) Aleve, Naproxen, Ibuprofen, Motrin, Advil, Goody's, BC's, all herbal medications, fish oil, and all vitamins.                        Do not wear jewelry, make up, or nail polish            Do not wear lotions, powders, perfumes/colognes, or deodorant.            Do not shave 48 hours prior to surgery.  Men may shave face and neck.            Do not bring valuables to the hospital.            [facility] is not responsible for any belongings or valuables.   Do NOT Smoke (Tobacco/Vaping) 24 hours prior to your procedure If you use a CPAP at night, you may bring all equipment for your overnight stay.   Contacts, glasses, dentures or partials may not be worn into surgery.      For patients  admitted to the hospital, discharge time will be determined by your treatment team.   Patients discharged the day of surgery will not be allowed to drive home, and someone needs to stay with them for 24 hours.     Special instructions:   [place]- Preparing For Surgery  Oral Hygiene is also important to reduce your risk of infection.  Remember - BRUSH YOUR TEETH THE MORNING OF SURGERY WITH YOUR REGULAR TOOTHPASTE   Before surgery, you can play an important role. Because skin is not sterile, your skin needs to be as free of germs as possible. You can reduce the number of germs on your skin by washing with Dial (or any antibacterial) Soap before surgery.    Please do  not use if you have an allergy to antibacterial soaps. If your skin becomes reddened/irritated stop using the Antibacterial Soap  Do not shave (including legs and underarms) for at least 48 hours prior to surgery. It is OK to shave your face.   Please follow these instructions carefully.              Shower the NIGHT BEFORE SURGERY and the MORNING OF SURGERY with (DIAL) Antibacterial Soap. Wash your body and hair with your normal shampoo/soap then rinse. Using a clean wash cloth wash from your neck down using the antibacterial soap, do not wash private area with the clean wash cloth. Wash thoroughly, paying special attention to the area where your surgery will be performed. Thoroughly rinse your body with warm water from the neck down.   Pat yourself dry with a CLEAN TOWEL.   Wear CLEAN PAJAMAS to bed the night before surgery.   Place CLEAN SHEETS on your bed the night of your surgery and DO NOT SLEEP WITH PETS.     Day of Surgery: Please shower morning of surgery using antibacterial soap Wear Clean/Comfortable clothing the morning of surgery Do not apply any deodorants/lotions.   Remember to brush your teeth WITH YOUR REGULAR TOOTHPASTE.   Questions were answered. Patient verbalized understanding of instructions.

## 2022-07-12 ENCOUNTER — Encounter (HOSPITAL_COMMUNITY): Payer: Self-pay | Admitting: Oral Surgery

## 2022-07-12 NOTE — Progress Notes (Signed)
patient voiced understanding of new arrival time of 0530 tomorrow 07/13/22.

## 2022-07-12 NOTE — Anesthesia Preprocedure Evaluation (Addendum)
Anesthesia Evaluation  Patient identified by MRN, date of birth, ID band Patient awake    Reviewed: Allergy & Precautions, NPO status , Patient's Chart, lab work & pertinent test results  Airway Mallampati: II       Dental  (+) Poor Dentition   Pulmonary Current Smoker and Patient abstained from smoking.   Pulmonary exam normal breath sounds clear to auscultation       Cardiovascular hypertension, Normal cardiovascular exam Rhythm:Regular Rate:Normal     Neuro/Psych  Headaches PSYCHIATRIC DISORDERS Anxiety Depression       GI/Hepatic Neg liver ROS,GERD  ,,Non restorable teeth   Endo/Other  negative endocrine ROS    Renal/GU Renal disease  negative genitourinary   Musculoskeletal negative musculoskeletal ROS (+)    Abdominal  (+) + obese  Peds  Hematology  (+) Blood dyscrasia, anemia Alpha thalassemia carrier   Anesthesia Other Findings   Reproductive/Obstetrics negative OB ROS                              Anesthesia Physical Anesthesia Plan  ASA: 2  Anesthesia Plan: General   Post-op Pain Management: Minimal or no pain anticipated   Induction: Intravenous  PONV Risk Score and Plan: 2 and Treatment may vary due to age or medical condition, Midazolam and Ondansetron  Airway Management Planned: Nasal ETT  Additional Equipment: None  Intra-op Plan:   Post-operative Plan: Extubation in OR  Informed Consent: I have reviewed the patients History and Physical, chart, labs and discussed the procedure including the risks, benefits and alternatives for the proposed anesthesia with the patient or authorized representative who has indicated his/her understanding and acceptance.     Dental advisory given  Plan Discussed with: Anesthesiologist and CRNA  Anesthesia Plan Comments:         Anesthesia Quick Evaluation

## 2022-07-13 ENCOUNTER — Other Ambulatory Visit: Payer: Self-pay

## 2022-07-13 ENCOUNTER — Encounter (HOSPITAL_COMMUNITY)
Admission: RE | Disposition: A | Discharge status: Home / Self Care | Payer: Self-pay | Source: Ambulatory Visit | Attending: Oral Surgery

## 2022-07-13 ENCOUNTER — Ambulatory Visit (HOSPITAL_BASED_OUTPATIENT_CLINIC_OR_DEPARTMENT_OTHER): Payer: Medicaid Other | Admitting: Anesthesiology

## 2022-07-13 ENCOUNTER — Ambulatory Visit (HOSPITAL_COMMUNITY): Payer: Medicaid Other | Admitting: Anesthesiology

## 2022-07-13 ENCOUNTER — Encounter (HOSPITAL_COMMUNITY): Payer: Self-pay | Admitting: Oral Surgery

## 2022-07-13 ENCOUNTER — Ambulatory Visit (HOSPITAL_COMMUNITY)
Admission: RE | Admit: 2022-07-13 | Discharge: 2022-07-13 | Disposition: A | Discharge status: Home / Self Care | Payer: Medicaid Other | Source: Ambulatory Visit | Attending: Oral Surgery | Admitting: Oral Surgery

## 2022-07-13 DIAGNOSIS — K029 Dental caries, unspecified: Secondary | ICD-10-CM | POA: Diagnosis not present

## 2022-07-13 DIAGNOSIS — I10 Essential (primary) hypertension: Secondary | ICD-10-CM | POA: Diagnosis not present

## 2022-07-13 DIAGNOSIS — K011 Impacted teeth: Secondary | ICD-10-CM | POA: Insufficient documentation

## 2022-07-13 DIAGNOSIS — F17210 Nicotine dependence, cigarettes, uncomplicated: Secondary | ICD-10-CM | POA: Insufficient documentation

## 2022-07-13 DIAGNOSIS — D563 Thalassemia minor: Secondary | ICD-10-CM | POA: Diagnosis not present

## 2022-07-13 DIAGNOSIS — K0850 Unsatisfactory restoration of tooth, unspecified: Secondary | ICD-10-CM | POA: Diagnosis not present

## 2022-07-13 DIAGNOSIS — K0889 Other specified disorders of teeth and supporting structures: Secondary | ICD-10-CM | POA: Diagnosis not present

## 2022-07-13 HISTORY — DX: Essential (primary) hypertension: I10

## 2022-07-13 HISTORY — DX: Anemia, unspecified: D64.9

## 2022-07-13 HISTORY — DX: Anxiety disorder, unspecified: F41.9

## 2022-07-13 HISTORY — DX: Depression, unspecified: F32.A

## 2022-07-13 HISTORY — PX: TOOTH EXTRACTION: SHX859

## 2022-07-13 LAB — CBC
HCT: 35.1 % — ABNORMAL LOW (ref 36.0–46.0)
Hemoglobin: 11.7 g/dL — ABNORMAL LOW (ref 12.0–15.0)
MCH: 27.9 pg (ref 26.0–34.0)
MCHC: 33.3 g/dL (ref 30.0–36.0)
MCV: 83.8 fL (ref 80.0–100.0)
Platelets: 310 10*3/uL (ref 150–400)
RBC: 4.19 MIL/uL (ref 3.87–5.11)
RDW: 13.5 % (ref 11.5–15.5)
WBC: 5.7 10*3/uL (ref 4.0–10.5)
nRBC: 0 % (ref 0.0–0.2)

## 2022-07-13 LAB — POCT PREGNANCY, URINE: Preg Test, Ur: NEGATIVE

## 2022-07-13 SURGERY — DENTAL RESTORATION/EXTRACTIONS
Anesthesia: General | Site: Mouth

## 2022-07-13 MED ORDER — DEXAMETHASONE SODIUM PHOSPHATE 10 MG/ML IJ SOLN
INTRAMUSCULAR | Status: DC | PRN
  Administered 2022-07-13: 10 mg via INTRAVENOUS

## 2022-07-13 MED ORDER — DEXAMETHASONE SODIUM PHOSPHATE 10 MG/ML IJ SOLN
INTRAMUSCULAR | Status: AC
  Filled 2022-07-13: qty 1

## 2022-07-13 MED ORDER — ONDANSETRON HCL 4 MG/2ML IJ SOLN: 4.0000 mg | Freq: Once | INTRAMUSCULAR | Status: DC | PRN

## 2022-07-13 MED ORDER — OXYCODONE HCL 5 MG PO TABS
ORAL_TABLET | ORAL | Status: AC
  Filled 2022-07-13: qty 1

## 2022-07-13 MED ORDER — LACTATED RINGERS IV SOLN: INTRAVENOUS | Status: DC

## 2022-07-13 MED ORDER — SODIUM CHLORIDE 0.9 % IR SOLN
Status: DC | PRN
  Administered 2022-07-13: 1000 mL

## 2022-07-13 MED ORDER — OXYCODONE HCL 5 MG PO TABS
5.0000 mg | ORAL_TABLET | Freq: Once | ORAL | Status: AC | PRN
  Administered 2022-07-13: 5 mg via ORAL

## 2022-07-13 MED ORDER — HYDROCODONE-ACETAMINOPHEN 5-325 MG PO TABS: 1.0000 | ORAL_TABLET | ORAL | 0 refills | Status: DC | PRN

## 2022-07-13 MED ORDER — FENTANYL CITRATE (PF) 100 MCG/2ML IJ SOLN: 25.0000 ug | INTRAMUSCULAR | Status: DC | PRN

## 2022-07-13 MED ORDER — MIDAZOLAM HCL 2 MG/2ML IJ SOLN
INTRAMUSCULAR | Status: AC
  Filled 2022-07-13: qty 2

## 2022-07-13 MED ORDER — OXYMETAZOLINE HCL 0.05 % NA SOLN
NASAL | Status: AC
  Filled 2022-07-13: qty 30

## 2022-07-13 MED ORDER — ONDANSETRON HCL 4 MG/2ML IJ SOLN
INTRAMUSCULAR | Status: AC
  Filled 2022-07-13: qty 2

## 2022-07-13 MED ORDER — CHLORHEXIDINE GLUCONATE 0.12 % MT SOLN
15.0000 mL | Freq: Once | OROMUCOSAL | Status: AC
  Administered 2022-07-13: 15 mL via OROMUCOSAL
  Filled 2022-07-13: qty 15

## 2022-07-13 MED ORDER — CEFAZOLIN SODIUM-DEXTROSE 2-4 GM/100ML-% IV SOLN
2.0000 g | INTRAVENOUS | Status: AC
  Administered 2022-07-13: 2 g via INTRAVENOUS
  Filled 2022-07-13: qty 100

## 2022-07-13 MED ORDER — AMOXICILLIN 500 MG PO CAPS: 500.0000 mg | ORAL_CAPSULE | Freq: Three times a day (TID) | ORAL | 0 refills | Status: DC

## 2022-07-13 MED ORDER — PROPOFOL 10 MG/ML IV BOLUS
INTRAVENOUS | Status: DC | PRN
  Administered 2022-07-13: 180 mg via INTRAVENOUS

## 2022-07-13 MED ORDER — LIDOCAINE 2% (20 MG/ML) 5 ML SYRINGE
INTRAMUSCULAR | Status: DC | PRN
  Administered 2022-07-13: 60 mg via INTRAVENOUS

## 2022-07-13 MED ORDER — ORAL CARE MOUTH RINSE: 15.0000 mL | Freq: Once | OROMUCOSAL | Status: AC

## 2022-07-13 MED ORDER — LIDOCAINE-EPINEPHRINE 2 %-1:100000 IJ SOLN
INTRAMUSCULAR | Status: DC | PRN
  Administered 2022-07-13: 20 mL via INTRADERMAL

## 2022-07-13 MED ORDER — PROPOFOL 10 MG/ML IV BOLUS
INTRAVENOUS | Status: AC
  Filled 2022-07-13: qty 20

## 2022-07-13 MED ORDER — SUGAMMADEX SODIUM 200 MG/2ML IV SOLN
INTRAVENOUS | Status: DC | PRN
  Administered 2022-07-13: 200 mg via INTRAVENOUS

## 2022-07-13 MED ORDER — ONDANSETRON HCL 4 MG/2ML IJ SOLN
INTRAMUSCULAR | Status: DC | PRN
  Administered 2022-07-13: 4 mg via INTRAVENOUS

## 2022-07-13 MED ORDER — OXYCODONE HCL 5 MG/5ML PO SOLN: 5.0000 mg | Freq: Once | ORAL | Status: AC | PRN

## 2022-07-13 MED ORDER — OXYMETAZOLINE HCL 0.05 % NA SOLN
NASAL | Status: DC | PRN
  Administered 2022-07-13 (×2): 2 via NASAL

## 2022-07-13 MED ORDER — FENTANYL CITRATE (PF) 250 MCG/5ML IJ SOLN
INTRAMUSCULAR | Status: AC
  Filled 2022-07-13: qty 5

## 2022-07-13 MED ORDER — 0.9 % SODIUM CHLORIDE (POUR BTL) OPTIME
TOPICAL | Status: DC | PRN
  Administered 2022-07-13: 1000 mL

## 2022-07-13 MED ORDER — LIDOCAINE-EPINEPHRINE 2 %-1:100000 IJ SOLN
INTRAMUSCULAR | Status: AC
  Filled 2022-07-13: qty 1

## 2022-07-13 MED ORDER — ROCURONIUM BROMIDE 10 MG/ML (PF) SYRINGE
PREFILLED_SYRINGE | INTRAVENOUS | Status: DC | PRN
  Administered 2022-07-13: 60 mg via INTRAVENOUS

## 2022-07-13 MED ORDER — PHENYLEPHRINE 80 MCG/ML (10ML) SYRINGE FOR IV PUSH (FOR BLOOD PRESSURE SUPPORT)
PREFILLED_SYRINGE | INTRAVENOUS | Status: DC | PRN
  Administered 2022-07-13 (×2): 80 ug via INTRAVENOUS

## 2022-07-13 MED ORDER — FENTANYL CITRATE (PF) 250 MCG/5ML IJ SOLN
INTRAMUSCULAR | Status: DC | PRN
  Administered 2022-07-13: 150 ug via INTRAVENOUS
  Administered 2022-07-13: 100 ug via INTRAVENOUS

## 2022-07-13 SURGICAL SUPPLY — 37 items
BAG COUNTER SPONGE SURGICOUNT (BAG) IMPLANT
BLADE SURG 15 STRL LF DISP TIS (BLADE) ×1 IMPLANT
BLADE SURG 15 STRL SS (BLADE) ×1
BUR CROSS CUT FISSURE 1.6 (BURR) ×1 IMPLANT
BUR EGG ELITE 4.0 (BURR) ×1 IMPLANT
CANISTER SUCT 3000ML PPV (MISCELLANEOUS) ×1 IMPLANT
COVER SURGICAL LIGHT HANDLE (MISCELLANEOUS) ×1 IMPLANT
GAUZE PACKING FOLDED 2  STR (GAUZE/BANDAGES/DRESSINGS) ×1
GAUZE PACKING FOLDED 2 STR (GAUZE/BANDAGES/DRESSINGS) ×1 IMPLANT
GLOVE BIO SURGEON STRL SZ 6.5 (GLOVE) IMPLANT
GLOVE BIO SURGEON STRL SZ7 (GLOVE) IMPLANT
GLOVE BIO SURGEON STRL SZ8 (GLOVE) ×1 IMPLANT
GLOVE BIOGEL PI IND STRL 6.5 (GLOVE) IMPLANT
GLOVE BIOGEL PI IND STRL 7.0 (GLOVE) IMPLANT
GLOVE INDICATOR 7.0 STRL GRN (GLOVE) IMPLANT
GLOVE INDICATOR 7.5 STRL GRN (GLOVE) IMPLANT
GOWN STRL REUS W/ TWL LRG LVL3 (GOWN DISPOSABLE) ×1 IMPLANT
GOWN STRL REUS W/ TWL XL LVL3 (GOWN DISPOSABLE) ×1 IMPLANT
GOWN STRL REUS W/TWL LRG LVL3 (GOWN DISPOSABLE) ×1
GOWN STRL REUS W/TWL XL LVL3 (GOWN DISPOSABLE) ×1
IV NS 1000ML (IV SOLUTION) ×1
IV NS 1000ML BAXH (IV SOLUTION) ×1 IMPLANT
KIT BASIN OR (CUSTOM PROCEDURE TRAY) ×1 IMPLANT
KIT TURNOVER KIT B (KITS) ×1 IMPLANT
NDL HYPO 25GX1X1/2 BEV (NEEDLE) ×2 IMPLANT
NEEDLE HYPO 25GX1X1/2 BEV (NEEDLE) ×2 IMPLANT
NS IRRIG 1000ML POUR BTL (IV SOLUTION) ×1 IMPLANT
PAD ARMBOARD 7.5X6 YLW CONV (MISCELLANEOUS) ×1 IMPLANT
SLEEVE IRRIGATION ELITE 7 (MISCELLANEOUS) ×1 IMPLANT
SPIKE FLUID TRANSFER (MISCELLANEOUS) ×1 IMPLANT
SPONGE SURGIFOAM ABS GEL 12-7 (HEMOSTASIS) IMPLANT
SUT CHROMIC 3 0 PS 2 (SUTURE) ×1 IMPLANT
SYR BULB IRRIG 60ML STRL (SYRINGE) ×1 IMPLANT
SYR CONTROL 10ML LL (SYRINGE) ×1 IMPLANT
TRAY ENT MC OR (CUSTOM PROCEDURE TRAY) ×1 IMPLANT
TUBING IRRIGATION (MISCELLANEOUS) ×1 IMPLANT
YANKAUER SUCT BULB TIP NO VENT (SUCTIONS) ×1 IMPLANT

## 2022-07-13 NOTE — Anesthesia Procedure Notes (Signed)
Procedure Name: Intubation Date/Time: 07/13/2022 7:44 AM  Performed by: Lance Coon, CRNAPre-anesthesia Checklist: Patient identified, Emergency Drugs available, Suction available, Patient being monitored and Timeout performed Patient Re-evaluated:Patient Re-evaluated prior to induction Oxygen Delivery Method: Circle system utilized Preoxygenation: Pre-oxygenation with 100% oxygen Induction Type: IV induction Ventilation: Mask ventilation without difficulty Laryngoscope Size: Miller and 3 Grade View: Grade I Nasal Tubes: Right, Nasal prep performed, Nasal Rae and Magill forceps - small, utilized Tube size: 6.5 mm Number of attempts: 1 Airway Equipment and Method: Stylet Placement Confirmation: ETT inserted through vocal cords under direct vision, positive ETCO2 and breath sounds checked- equal and bilateral Tube secured with: Tape Dental Injury: Teeth and Oropharynx as per pre-operative assessment

## 2022-07-13 NOTE — H&P (Signed)
H&P documentation  -History and Physical Reviewed  -Patient has been re-examined  -No change in the plan of care  Stacey [name]  

## 2022-07-13 NOTE — Anesthesia Postprocedure Evaluation (Signed)
Anesthesia Post Note  Patient: THRESIA RAMANATHAN  Procedure(s) Performed: DENTAL RESTORATION/EXTRACTIONS (Mouth)     Patient location during evaluation: PACU Anesthesia Type: General Level of consciousness: awake and alert and oriented Pain management: pain level controlled Vital Signs Assessment: post-procedure vital signs reviewed and stable Respiratory status: spontaneous breathing, nonlabored ventilation and respiratory function stable Cardiovascular status: blood pressure returned to baseline and stable Postop Assessment: no apparent nausea or vomiting Anesthetic complications: no   No notable events documented.  Last Vitals:  Vitals:   07/13/22 0845 07/13/22 0900  BP: 116/77 126/82  Pulse: 92 86  Resp: 18 17  Temp: 36.7 C   SpO2: 100% 100%    Last Pain:  Vitals:   07/13/22 0915  TempSrc:   PainSc: 7                  Wilmoth Rasnic A.

## 2022-07-13 NOTE — Op Note (Unsigned)
NAMEELEYNA, BRUGH MEDICAL RECORD NO: [id number] ACCOUNT NO: [id number] DATE OF BIRTH: [birth year] FACILITY: [facility] LOCATION: [facility] PHYSICIAN: Gae Bon, DDS  Operative Report   DATE OF PROCEDURE: 07/13/2022   PREOPERATIVE DIAGNOSIS:  Nonrestorable teeth secondary to dental caries numbers 1, 4, 16, impacted teeth #31, 32.  POSTOPERATIVE DIAGNOSIS:  Nonrestorable teeth secondary to dental caries numbers 1, 4, 16, impacted teeth # 31, 32.  PROCEDURE:  Extraction teeth 1, 4, 16, 31, 32.  SURGEON:  Gae Bon, DDS  ANESTHESIA:  General, nasal intubation, Dr. Royce Macadamia attending.  DESCRIPTION OF PROCEDURE:  The patient was taken to the operating room and placed on the table in the supine position.  General anesthesia was administered and nasal endotracheal tube was placed.  The eyes were protected, the tube was secured, the  patient was draped for surgery.  Timeout was performed.  The posterior pharynx was suctioned and a throat pack was placed.  2% lidocaine 1:100,000 epinephrine was infiltrated in an inferior alveolar block on the right side and in buccal and palatal  infiltration around the maxillary teeth.  A bite block was placed on the right side of the mouth.  Tooth #16 was operated first.  A 15 blade was used to make an incision in the gingival sulcus.  The periosteum was reflected.  The tooth was elevated and  removed with the dental forceps.  The socket was curetted, irrigated and closed with 3-0 chromic.  Attention was then turned to the right side.  A 15 blade was used to make an incision beginning at tooth #29 and carried proximally to the curvature of the  ramus along the ascending alveolus.  The tissue was reflected buccally to expose teeth #31 and 32.  Bone was removed around tooth #31.  The tooth was elevated and removed with 301 elevator.  Tooth #31 required removal of the crown, sectioning of the  roots and removal of bone and then it was removed with the 301  elevator.  The socket was curetted, irrigated and closed with 3-0 chromic in the maxilla, the 15 blade was used to make an incision around teeth numbers 1 and 4 in the gingival sulcus.  The  periosteum was reflected.  The teeth were elevated, they could not be removed with the forceps, so bone was removed circumferentially around the crowns and then the tooth #1 was removed with the forceps.  Tooth #4 required sectioning of the roots and  then removal with the rongeurs.  The maxillary sockets were irrigated and closed with 3-0 chromic.  Then, the oral cavity was irrigated and suctioned.  The throat pack was removed.  The patient was left under care of anesthesia for extubation and  transported to recovery room with plans for discharge home through day surgery.  ESTIMATED BLOOD LOSS:  Minimal.  COMPLICATIONS:  None.  SPECIMENS:  There were no specimens.  COUNTS:  Correct.     Elián.Darby D: 07/13/2022 8:29:01 am T: 07/13/2022 8:57:00 am  JOB: [id number]/ [id number]

## 2022-07-13 NOTE — Op Note (Signed)
07/13/2022  8:24 AM  PATIENT:  Stacey [name]  32 y.o. female  PRE-OPERATIVE DIAGNOSIS:  non restorable teeth # 1, 4, 16, impacted teeth # 31, 32.  POST-OPERATIVE DIAGNOSIS:  SAME  PROCEDURE:  Procedure(s): EXTRACTION TEETH # 1, 4, 16, 31, 32  SURGEON:  Surgeon(s): Diona Browner, DMD  ANESTHESIA:   local and general  EBL:  minimal  DRAINS: none   SPECIMEN:  No Specimen  COUNTS:  YES  PLAN OF CARE: Discharge to home after PACU  PATIENT DISPOSITION:  PACU - hemodynamically stable.   PROCEDURE DETAILS: Dictation # [id number]  Gae Bon, DMD 07/13/2022 8:24 AM

## 2022-07-13 NOTE — Transfer of Care (Signed)
Immediate Anesthesia Transfer of Care Note  Patient: Stacey [name]  Procedure(s) Performed: DENTAL RESTORATION/EXTRACTIONS (Mouth)  Patient Location: PACU  Anesthesia Type:General  Level of Consciousness: drowsy and patient cooperative  Airway & Oxygen Therapy: Patient Spontanous Breathing  Post-op Assessment: Report given to RN and Post -op Vital signs reviewed and stable  Post vital signs: Reviewed and stable  Last Vitals:  Vitals Value Taken Time  BP    Temp    Pulse    Resp    SpO2      Last Pain:  Vitals:   07/13/22 0604  TempSrc:   PainSc: 0-No pain         Complications: No notable events documented.

## 2022-07-14 ENCOUNTER — Encounter (HOSPITAL_COMMUNITY): Payer: Self-pay | Admitting: Oral Surgery

## 2022-07-19 DIAGNOSIS — Z419 Encounter for procedure for purposes other than remedying health state, unspecified: Secondary | ICD-10-CM | POA: Diagnosis not present

## 2022-08-01 ENCOUNTER — Emergency Department (HOSPITAL_BASED_OUTPATIENT_CLINIC_OR_DEPARTMENT_OTHER): Payer: Medicaid Other

## 2022-08-01 ENCOUNTER — Other Ambulatory Visit: Payer: Self-pay

## 2022-08-01 ENCOUNTER — Encounter (HOSPITAL_BASED_OUTPATIENT_CLINIC_OR_DEPARTMENT_OTHER): Payer: Self-pay

## 2022-08-01 ENCOUNTER — Emergency Department (HOSPITAL_BASED_OUTPATIENT_CLINIC_OR_DEPARTMENT_OTHER)
Admission: EM | Admit: 2022-08-01 | Discharge: 2022-08-01 | Disposition: A | Discharge status: Home / Self Care | Payer: Medicaid Other | Attending: Emergency Medicine | Admitting: Emergency Medicine

## 2022-08-01 DIAGNOSIS — I10 Essential (primary) hypertension: Secondary | ICD-10-CM | POA: Diagnosis not present

## 2022-08-01 DIAGNOSIS — R519 Headache, unspecified: Secondary | ICD-10-CM | POA: Insufficient documentation

## 2022-08-01 DIAGNOSIS — Z79899 Other long term (current) drug therapy: Secondary | ICD-10-CM | POA: Diagnosis not present

## 2022-08-01 DIAGNOSIS — F17210 Nicotine dependence, cigarettes, uncomplicated: Secondary | ICD-10-CM | POA: Insufficient documentation

## 2022-08-01 LAB — CBC WITH DIFFERENTIAL/PLATELET
Abs Immature Granulocytes: 0.01 10*3/uL (ref 0.00–0.07)
Basophils Absolute: 0 10*3/uL (ref 0.0–0.1)
Basophils Relative: 0 %
Eosinophils Absolute: 0.1 10*3/uL (ref 0.0–0.5)
Eosinophils Relative: 2 %
HCT: 36.4 % (ref 36.0–46.0)
Hemoglobin: 12.2 g/dL (ref 12.0–15.0)
Immature Granulocytes: 0 %
Lymphocytes Relative: 37 %
Lymphs Abs: 1.7 10*3/uL (ref 0.7–4.0)
MCH: 27.7 pg (ref 26.0–34.0)
MCHC: 33.5 g/dL (ref 30.0–36.0)
MCV: 82.7 fL (ref 80.0–100.0)
Monocytes Absolute: 0.3 10*3/uL (ref 0.1–1.0)
Monocytes Relative: 6 %
Neutro Abs: 2.5 10*3/uL (ref 1.7–7.7)
Neutrophils Relative %: 55 %
Platelets: 464 10*3/uL — ABNORMAL HIGH (ref 150–400)
RBC: 4.4 MIL/uL (ref 3.87–5.11)
RDW: 13.1 % (ref 11.5–15.5)
WBC: 4.5 10*3/uL (ref 4.0–10.5)
nRBC: 0 % (ref 0.0–0.2)

## 2022-08-01 LAB — COMPREHENSIVE METABOLIC PANEL
ALT: 13 U/L (ref 0–44)
AST: 18 U/L (ref 15–41)
Albumin: 5 g/dL (ref 3.5–5.0)
Alkaline Phosphatase: 59 U/L (ref 38–126)
Anion gap: 6 (ref 5–15)
BUN: 6 mg/dL (ref 6–20)
CO2: 25 mmol/L (ref 22–32)
Calcium: 9.9 mg/dL (ref 8.9–10.3)
Chloride: 108 mmol/L (ref 98–111)
Creatinine, Ser: 0.61 mg/dL (ref 0.44–1.00)
GFR, Estimated: >60 mL/min (ref >60)
Glucose, Bld: 98 mg/dL (ref 70–99)
Potassium: 4.3 mmol/L (ref 3.5–5.1)
Sodium: 139 mmol/L (ref 135–145)
Total Bilirubin: 0.4 mg/dL (ref 0.3–1.2)
Total Protein: 8 g/dL (ref 6.5–8.1)

## 2022-08-01 MED ORDER — METOCLOPRAMIDE HCL 5 MG/ML IJ SOLN
10.0000 mg | Freq: Once | INTRAMUSCULAR | Status: AC
  Administered 2022-08-01: 10 mg via INTRAVENOUS
  Filled 2022-08-01: qty 2

## 2022-08-01 MED ORDER — DIPHENHYDRAMINE HCL 50 MG/ML IJ SOLN
25.0000 mg | Freq: Once | INTRAMUSCULAR | Status: AC
  Administered 2022-08-01: 25 mg via INTRAVENOUS
  Filled 2022-08-01: qty 1

## 2022-08-01 MED ORDER — KETOROLAC TROMETHAMINE 15 MG/ML IJ SOLN
15.0000 mg | Freq: Once | INTRAMUSCULAR | Status: AC
  Administered 2022-08-01: 15 mg via INTRAVENOUS
  Filled 2022-08-01: qty 1

## 2022-08-01 MED ORDER — SODIUM CHLORIDE 0.9 % IV BOLUS
1000.0000 mL | Freq: Once | INTRAVENOUS | Status: AC
  Administered 2022-08-01: 1000 mL via INTRAVENOUS

## 2022-08-01 NOTE — Discharge Instructions (Addendum)
Note the workup today was overall reassuring.  As discussed, CT imaging was negative for any acute abnormalities.  Recommend follow-up with neurology outpatient given recurrent migraine type headaches.  Please do not hesitate to return to emergency department for worrisome signs and symptoms we discussed become apparent.

## 2022-08-01 NOTE — ED Provider Notes (Signed)
[facility] Provider Note   CSN: [id number] Arrival date & time: 08/01/22  1627     History  Chief Complaint  Patient presents with   Headache    Stacey [name] is a 32 y.o. female.   Headache   32 year old female presents emergency department with complaints of headache.  Patient states that headache began yesterday gradual onset and is worsened since onset.  Patient with history of migraines but states "this feels different."  Reports migraines typically left retro-orbital without radiation.  She states that this headache is in occipital region with radiation to her forehead.  Reports some blurred vision but states this is normal for migraine type headaches.  Reports sensitivity to light and sound.  Denies fever, slurred speech, facial droop, weakness/sensory deficits in upper lower extremities, gait abnormalities, chest pain, shortness of breath.  Reports taking Tylenol as well as hydrocodone earlier today of which have not helped symptoms.    Past medical history significant for migraines, reflux, hypertension,   Home Medications Prior to Admission medications   Medication Sig Start Date End Date Taking? Authorizing Provider  amoxicillin (AMOXIL) 500 MG capsule Take 1 capsule (500 mg total) by mouth 3 (three) times daily. 07/13/22   Diona Browner, DMD  HYDROcodone-acetaminophen (NORCO) 5-325 MG tablet Take 1 tablet by mouth every 4 (four) hours as needed for moderate pain. 07/13/22   Diona Browner, DMD  NIFEdipine (PROCARDIA XL) 30 MG 24 hr tablet Take 1 tablet (30 mg total) by mouth daily. Patient not taking: Reported on 12/13/2021 10/25/21   Woodroe Mode, MD  norethindrone (MICRONOR) 0.35 MG tablet Take 1 tablet (0.35 mg total) by mouth daily. Patient taking differently: Take 1 tablet by mouth every evening. 12/13/21   Constant, Vickii Chafe, MD      Allergies    Patient has no known allergies.    Review of Systems   Review of Systems   Neurological:  Positive for headaches.  All other systems reviewed and are negative.   Physical Exam Updated Vital Signs BP (!) 120/94 (BP Location: Right Arm)   Pulse 81   Temp 97.9 F (36.6 C)   Resp 18   LMP 06/14/2022 (Exact Date)   SpO2 100%  Physical Exam Vitals and nursing note reviewed.  Constitutional:      General: She is not in acute distress.    Appearance: She is well-developed.  HENT:     Head: Normocephalic and atraumatic.  Eyes:     Conjunctiva/sclera: Conjunctivae normal.  Cardiovascular:     Rate and Rhythm: Normal rate and regular rhythm.     Heart sounds: No murmur heard. Pulmonary:     Effort: Pulmonary effort is normal. No respiratory distress.     Breath sounds: Normal breath sounds.  Abdominal:     Palpations: Abdomen is soft.     Tenderness: There is no abdominal tenderness.  Musculoskeletal:        General: No swelling.     Cervical back: Neck supple.  Skin:    General: Skin is warm and dry.     Capillary Refill: Capillary refill takes less than 2 seconds.  Neurological:     Mental Status: She is alert.     Comments: Alert and oriented to self, place, time and event.   Speech is fluent, clear without dysarthria or dysphasia.   Strength 5/5 in upper/lower extremities   Sensation intact in upper/lower extremities   Normal gait.  Negative Romberg. No  pronator drift.  Normal finger-to-nose and feet tapping.  CN I not tested  CN II not tested CN III, IV, VI PERRLA and EOMs intact bilaterally  CN V Intact sensation to sharp and light touch to the face  CN VII facial movements symmetric  CN VIII not tested  CN IX, X no uvula deviation, symmetric rise of soft palate  CN XI 5/5 SCM and trapezius strength bilaterally  CN XII Midline tongue protrusion, symmetric L/R movements     Psychiatric:        Mood and Affect: Mood normal.     ED Results / Procedures / Treatments   Labs (all labs ordered are listed, but only abnormal results  are displayed) Labs Reviewed  CBC WITH DIFFERENTIAL/PLATELET - Abnormal; Notable for the following components:      Result Value   Platelets 464 (*)    All other components within normal limits  COMPREHENSIVE METABOLIC PANEL  PREGNANCY, URINE    EKG None  Radiology CT Head Wo Contrast  Result Date: 08/01/2022 CLINICAL DATA:  Headache EXAM: CT HEAD WITHOUT CONTRAST TECHNIQUE: Contiguous axial images were obtained from the base of the skull through the vertex without intravenous contrast. RADIATION DOSE REDUCTION: This exam was performed according to the departmental dose-optimization program which includes automated exposure control, adjustment of the mA and/or kV according to patient size and/or use of iterative reconstruction technique. COMPARISON:  None Available. FINDINGS: Brain: No evidence of acute infarction, hemorrhage, hydrocephalus, extra-axial collection or mass lesion/mass effect. Vascular: No hyperdense vessel or unexpected calcification. Skull: Normal. Negative for fracture or focal lesion. Sinuses/Orbits: No acute finding. Other: None. IMPRESSION: No acute intracranial abnormality. Electronically Signed   By: Ronney Asters M.D.   On: 08/01/2022 17:41    Procedures Procedures    Medications Ordered in ED Medications  sodium chloride 0.9 % bolus 1,000 mL (0 mLs Intravenous Stopped 08/01/22 1859)  ketorolac (TORADOL) 15 MG/ML injection 15 mg (15 mg Intravenous Given 08/01/22 1746)  metoCLOPramide (REGLAN) injection 10 mg (10 mg Intravenous Given 08/01/22 1747)  diphenhydrAMINE (BENADRYL) injection 25 mg (25 mg Intravenous Given 08/01/22 1743)    ED Course/ Medical Decision Making/ A&P Clinical Course as of 08/01/22 1901  Wed Aug 01, 2022  1826 Reassessment of the patient showed improvement of symptoms.  Patient resting comfortably on exam room bed. [CR]    Clinical Course User Index [CR] Wilnette Kales, PA                             Medical Decision Making Amount  and/or Complexity of Data Reviewed Labs: ordered. Radiology: ordered.  Risk Prescription drug management.   This patient presents to the ED for concern of headache, this involves an extensive number of treatment options, and is a complaint that carries with it a high risk of complications and morbidity.  The differential diagnosis includes CVA, cerebral venous thrombosis, meningitis/encephalitis, migraine/tension/cluster headache, malignancy   Co morbidities that complicate the patient evaluation  See HPI   Additional history obtained:  Additional history obtained from EMR External records from outside source obtained and reviewed including hospital records   Lab Tests:  I Ordered, and personally interpreted labs.  The pertinent results include: No leukocytosis noted.  No evidence of anemia.  Slight elevation in platelets of 464.  No electrolyte abnormalities appreciated.  No transaminitis.  No renal dysfunction.   Imaging Studies ordered:  I ordered imaging studies including CT head  I independently visualized and interpreted imaging which showed no acute intracranial abnormalities I agree with the radiologist interpretation   Cardiac Monitoring: / EKG:  The patient was maintained on a cardiac monitor.  I personally viewed and interpreted the cardiac monitored which showed an underlying rhythm of: Sinus rhythm   Consultations Obtained:  N/a   Problem List / ED Course / Critical interventions / Medication management  Headache I ordered medication including Reglan, Toradol, Benadryl and 1 L normal saline for migraine cocktail   Reevaluation of the patient after these medicines showed that the patient improved I have reviewed the patients home medicines and have made adjustments as needed   Social Determinants of Health:  Chronic cigarette use.  Denies illicit drug use.   Test / Admission - Considered:  Headache Vitals signs within normal range and stable  throughout visit. Laboratory/imaging studies significant for: See above Patient workup today overall reassuring.  Patient noted significant improvement with administration of migraine cocktail while in the emergency department.  Patient neurologically intact with CT imaging without acute abnormality.  Given patient's history of recurrent migraine type headaches, we will send an neurology information for outpatient follow-up.  Further workup deemed unnecessary at this time.  Treatment plan discussed at length with the patient and she acknowledged understanding was agreeable to said plan. Worrisome signs and symptoms were discussed with the patient, and the patient acknowledged understanding to return to the ED if noticed. Patient was stable upon discharge.          Final Clinical Impression(s) / ED Diagnoses Final diagnoses:  Acute nonintractable headache, unspecified headache type    Rx / DC Orders ED Discharge Orders     None         Wilnette Kales, [place] 08/01/22 1901    Fransico Meadow, MD 08/05/22 [phone]

## 2022-08-01 NOTE — ED Triage Notes (Signed)
Pt c/o occipital HA since yesterday AM "& it's moving forward;" Pt advises taken tylenol extra strength last night, hydrocodone at 9a, BP medication 9a. Associated sensitivity to light/sound

## 2022-08-01 NOTE — ED Notes (Signed)
Patient transported to CT 

## 2022-08-03 ENCOUNTER — Encounter: Payer: Self-pay | Admitting: Neurology

## 2022-08-13 NOTE — Progress Notes (Unsigned)
NEUROLOGY CONSULTATION NOTE  TREVI REVOLORIO MRN: [id number] DOB: July 12, 1990  Referring provider: Margaretmary Eddy, MD (ED referral) Primary care provider: Gala Romney, MD  Reason for consult:  headache  Assessment/Plan:   Migraine with aura, without status migrainosus, not intractable Paresthesias  Check B12 and TSH for further evaluation of paresthesias Migraine prevention:  start propranolol ER '60mg'$  daily Migraine rescue:  sumatriptan '100mg'$  Limit use of pain relievers to no more than 2 days out of week to prevent risk of rebound or medication-overuse headache. Keep headache diary Follow up 6 months.    Subjective:  Stacey [name] is a 32 year old right-handed+ female with hypertension and alpha thalassemia silent carrier who presents for migraines.  History supplemented by ED and prior neurologist's notes.  Migraines since 45-53 years old.  Typically they are left retro-orbital burning/pressure and sometimes throbbing pain with associated seeing spots.  Some photophobia and phonophobia, sometimes nausea.  Takes Excedrin or Extra-strength Tylenol and takes a nap - resolved in 2-3 hours.  Occurs 1 to 2 times a week.  Triggers:  smoking.  Seen in the ED on 08/01/2022 for a particularly severe headache that was ongoing for 3 days.  Also, this was different because it was associated with dizziness, off-balance and located mostly in back of there head.  CT head personally reviewed was unremarkable.  Treated with migraine cocktail which worked.  Has not had a recurrence of that severe migraine.  Past NSAIDS/analgesics:  Fioricet, Fioricet with codeine, naproxen, meloxicam, tramadol Past abortive triptans:  none Past abortive ergotamine:  none Past muscle relaxants:  Flexeril, Robaxin, Zanflex Past anti-emetic:  Zofran '4mg'$ , Phenergan '25mg'$  Past antihypertensive medications:  nifedipine Past antidepressant medications:  Lexapro (aggravated headaches) Past anticonvulsant medications:   none Past anti-CGRP:  none Past vitamins/Herbal/Supplements:  magnesium, riboflavin Past antihistamines/decongestants: Claritin, Dayquiul Other past therapies:  none  Current NSAIDS/analgesics:  Excedrin Migraine, Extra-strength Tylenol Current triptans:  none Current ergotamine:  none Current anti-emetic:  none Current muscle relaxants:  none Current Antihypertensive medications:  Procardia Current Antidepressant medications:  none Current Anticonvulsant medications:  none Current anti-CGRP:  none Current Vitamins/Herbal/Supplements:  none Current Antihistamines/Decongestants:  none Other therapy:  none Birth control:  norethindrone    Caffeine:  Rarely coffee.  No soda or tea Alcohol:  occasionally glass of wine Smoker:  smokes 2 cigars a day Diet:  Now tries to drink at least 32 oz water daily.  Sometimes skips meals (mainly breakfast) Exercise:  no Depression:  overall stable; Anxiety:  overall stable Other pain:  back pain Sleep hygiene:  good. 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   On Valtrex-confirm taking prophylaxis > 34 wks   HSV-2 infection    No hx of outbreak   Hx of preeclampsia, prior pregnancy, currently pregnant, third trimester 11/09/2016   Postpartum Normal CMP at baseline Took ASA for several weeks but then stopped   Hypertension    Kidney infection    kidney infection 2012   Migraine     PAST SURGICAL HISTORY: Past Surgical History:  Procedure Laterality Date   NO PAST SURGERIES     TOOTH EXTRACTION N/A 07/13/2022  Procedure: DENTAL RESTORATION/EXTRACTIONS;  Surgeon: Diona Browner, DMD;  Location: [facility];   Service: Oral Surgery;  Laterality: N/A;    MEDICATIONS: Current Outpatient Medications on File Prior to Visit  Medication Sig Dispense Refill   amoxicillin (AMOXIL) 500 MG capsule Take 1 capsule (500 mg total) by mouth 3 (three) times daily. 21 capsule 0   HYDROcodone-acetaminophen (NORCO) 5-325 MG tablet Take 1 tablet by mouth every 4 (four) hours as needed for moderate pain. 20 tablet 0   NIFEdipine (PROCARDIA XL) 30 MG 24 hr tablet Take 1 tablet (30 mg total) by mouth daily. (Patient not taking: Reported on 12/13/2021) 30 tablet 0   norethindrone (MICRONOR) 0.35 MG tablet Take 1 tablet (0.35 mg total) by mouth daily. (Patient taking differently: Take 1 tablet by mouth every evening.) 28 tablet 11   No current facility-administered medications on file prior to visit.    ALLERGIES: No Known Allergies  FAMILY HISTORY: Family History  Problem Relation Age of Onset   Hypertension Mother    Migraines Mother    Asthma Brother    Arthritis Maternal Grandmother    Cancer Maternal Grandmother    Arthritis Paternal Grandmother    Healthy Father    Anesthesia problems Neg Hx     Objective:  Blood pressure 128/80, pulse 100, height 5' (1.524 m), weight 162 lb 6.4 oz (73.7 kg), SpO2 99 %, currently breastfeeding. General: No acute distress.  Patient appears well-groomed.   Head:  Normocephalic/atraumatic Eyes:  fundi examined but not visualized Neck: supple, no paraspinal tenderness, full range of motion Back: No paraspinal tenderness Heart: regular rate and rhythm Lungs: Clear to auscultation bilaterally. Vascular: No carotid bruits. Neurological Exam: Mental status: alert and oriented to person, place, and time, speech fluent and not dysarthric, language intact. Cranial nerves: CN I: not tested CN II: pupils equal, round and reactive to light, visual fields intact CN III, IV, VI:  full range of motion, no nystagmus, no ptosis CN V: facial sensation intact. CN VII: upper and  lower face symmetric CN VIII: hearing intact CN IX, X: gag intact, uvula midline CN XI: sternocleidomastoid and trapezius muscles intact CN XII: tongue midline Bulk & Tone: normal, no fasciculations. Motor:  muscle strength 5/5 throughout Sensation:  Pinprick, temperature and vibratory sensation intact. Deep Tendon Reflexes:  2+ throughout,  toes downgoing.   Finger to nose testing:  Without dysmetria.   Heel to shin:  Without dysmetria.   Gait:  Normal station and stride.  Romberg negative.    Thank you for allowing me to take part in the care of this patient.  Metta Clines, DO  CC: Gala Romney, MD

## 2022-08-14 ENCOUNTER — Other Ambulatory Visit (INDEPENDENT_AMBULATORY_CARE_PROVIDER_SITE_OTHER): Payer: Medicaid Other

## 2022-08-14 ENCOUNTER — Ambulatory Visit (INDEPENDENT_AMBULATORY_CARE_PROVIDER_SITE_OTHER): Payer: Medicaid Other | Admitting: Neurology

## 2022-08-14 ENCOUNTER — Encounter: Payer: Self-pay | Admitting: Neurology

## 2022-08-14 VITALS — BP 128/80 | HR 100 | Ht 60.0 in | Wt 162.4 lb

## 2022-08-14 DIAGNOSIS — G43109 Migraine with aura, not intractable, without status migrainosus: Secondary | ICD-10-CM | POA: Diagnosis not present

## 2022-08-14 DIAGNOSIS — R202 Paresthesia of skin: Secondary | ICD-10-CM

## 2022-08-14 MED ORDER — SUMATRIPTAN SUCCINATE 100 MG PO TABS: 100.0000 mg | ORAL_TABLET | ORAL | 5 refills | Status: DC | PRN

## 2022-08-14 MED ORDER — PROPRANOLOL HCL ER 60 MG PO CP24: 60.0000 mg | ORAL_CAPSULE | Freq: Every day | ORAL | 5 refills | Status: DC

## 2022-08-14 NOTE — Patient Instructions (Signed)
  Check B12 and TSH. Start propranolol ER 72m daily.  Contact [facility] 4 weeks with update and we can increase dose if needed. Take sumatriptan 1079mat earliest onset of headache.  May repeat dose once in 2 hours if needed.  Maximum 2 tablets in 24 hours. Limit use of pain relievers to no more than 2 days out of the week.  These medications include acetaminophen, NSAIDs (ibuprofen/Advil/Motrin, naproxen/Aleve, triptans (Imitrex/sumatriptan), Excedrin, and narcotics.  This will help reduce risk of rebound headaches. Routine exercise Stay adequately hydrated (aim for 64 oz water daily) Keep headache diary Maintain proper stress management Maintain proper sleep hygiene Do not skip meals Consider supplements:  magnesium citrate 40070maily, riboflavin 400m37mily, coenzyme Q10 300mg82mly.

## 2022-08-15 ENCOUNTER — Encounter: Payer: Self-pay | Admitting: Neurology

## 2022-08-15 LAB — TSH: TSH: 0.91 u[IU]/mL (ref 0.35–5.50)

## 2022-08-15 LAB — VITAMIN B12: Vitamin B-12: 402 pg/mL (ref 211–911)

## 2022-08-17 DIAGNOSIS — Z419 Encounter for procedure for purposes other than remedying health state, unspecified: Secondary | ICD-10-CM | POA: Diagnosis not present

## 2022-09-17 DIAGNOSIS — Z419 Encounter for procedure for purposes other than remedying health state, unspecified: Secondary | ICD-10-CM | POA: Diagnosis not present

## 2022-10-17 ENCOUNTER — Encounter (HOSPITAL_COMMUNITY): Payer: Self-pay

## 2022-10-17 ENCOUNTER — Ambulatory Visit (HOSPITAL_COMMUNITY)
Admission: EM | Admit: 2022-10-17 | Discharge: 2022-10-17 | Disposition: A | Discharge status: Home / Self Care | Payer: Medicaid Other | Attending: Family Medicine | Admitting: Family Medicine

## 2022-10-17 DIAGNOSIS — Z419 Encounter for procedure for purposes other than remedying health state, unspecified: Secondary | ICD-10-CM | POA: Diagnosis not present

## 2022-10-17 DIAGNOSIS — N3090 Cystitis, unspecified without hematuria: Secondary | ICD-10-CM

## 2022-10-17 LAB — POCT URINALYSIS DIP (MANUAL ENTRY)
Bilirubin, UA: NEGATIVE
Glucose, UA: NEGATIVE mg/dL
Ketones, POC UA: NEGATIVE mg/dL
Nitrite, UA: POSITIVE — AB
Spec Grav, UA: 1.02 (ref 1.010–1.025)
Urobilinogen, UA: 0.2 E.U./dL
pH, UA: 6 (ref 5.0–8.0)

## 2022-10-17 MED ORDER — KETOROLAC TROMETHAMINE 30 MG/ML IJ SOLN: 60.0000 mg | Freq: Once | INTRAMUSCULAR | Status: DC

## 2022-10-17 MED ORDER — KETOROLAC TROMETHAMINE 60 MG/2ML IM SOLN
INTRAMUSCULAR | Status: AC
  Filled 2022-10-17: qty 2

## 2022-10-17 MED ORDER — CEPHALEXIN 500 MG PO CAPS: 500.0000 mg | ORAL_CAPSULE | Freq: Two times a day (BID) | ORAL | 0 refills | Status: DC

## 2022-10-17 MED ORDER — IBUPROFEN 800 MG PO TABS: 800.0000 mg | ORAL_TABLET | Freq: Three times a day (TID) | ORAL | 0 refills | Status: DC

## 2022-10-17 MED ORDER — KETOROLAC TROMETHAMINE 60 MG/2ML IM SOLN
60.0000 mg | Freq: Once | INTRAMUSCULAR | Status: AC
  Administered 2022-10-17: 60 mg via INTRAMUSCULAR

## 2022-10-17 NOTE — ED Triage Notes (Signed)
Patient here today with c/o right side LB pain with pain in urinating and frequent urge to urinate that started today. Has not tried any medications.

## 2022-10-17 NOTE — Discharge Instructions (Addendum)
You have had a urine culture sent today. We will call you with any significant abnormalities or if there is need to begin or change treatment or pursue further follow up.  You may also review your test results online through MyChart. If you do not have a MyChart account, instructions to sign up should be on your discharge paperwork.  Meds ordered this encounter  Medications   cephALEXin (KEFLEX) 500 MG capsule    Sig: Take 1 capsule (500 mg total) by mouth 2 (two) times daily.    Dispense:  10 capsule    Refill:  0   ibuprofen (ADVIL) 800 MG tablet    Sig: Take 1 tablet (800 mg total) by mouth 3 (three) times daily with meals.    Dispense:  21 tablet    Refill:  0   ketorolac (TORADOL) 30 MG/ML injection 60 mg

## 2022-10-20 NOTE — ED Provider Notes (Signed)
[facility]    ASSESSMENT & PLAN:  1. Cystitis    Meds ordered this encounter  Medications   cephALEXin (KEFLEX) 500 MG capsule    Sig: Take 1 capsule (500 mg total) by mouth 2 (two) times daily.    Dispense:  10 capsule    Refill:  0   ibuprofen (ADVIL) 800 MG tablet    Sig: Take 1 tablet (800 mg total) by mouth 3 (three) times daily with meals.    Dispense:  21 tablet    Refill:  0   ketorolac (TORADOL) injection 60 mg   Work note provided. No signs of pyelonephritis. Urine culture sent. Will notify patient of any significant results. Will follow up with her PCP or here if not showing improvement over the next 48 hours, sooner if needed.  Outlined signs and symptoms indicating need for more acute intervention. Patient verbalized understanding. After Visit Summary given.  SUBJECTIVE:  Stacey [name] is a 32 y.o. female who complains of urinary frequency, urgency for the past 24-48 hours. Without fever, chills, vaginal discharge or bleeding. Does report R flank discomfort. Gross hematuria: not present. No specific aggravating or alleviating factors reported. No LE edema. Normal PO intake without n/v/d. Without specific abdominal pain. Ambulatory without difficulty. No tx PTA.  LMP: Patient's last menstrual period was 10/04/2022 (exact date).  OBJECTIVE:  Vitals:   10/17/22 1753 10/17/22 1754  BP:  127/77  Pulse:  93  Resp:  16  Temp:  98.6 F (37 C)  TempSrc:  Oral  SpO2:  98%  Weight: 72.6 kg   Height: 5' (1.524 m)    General appearance: alert; no distress HENT: oropharynx: moist Lungs: unlabored respirations Abdomen: soft, non-tender; bowel sounds normal; no masses or organomegaly; no guarding or rebound tenderness Back: no CVA tenderness Extremities: no edema; symmetrical with no gross deformities Skin: warm and dry Neurologic: normal gait Psychological: alert and cooperative; normal mood and affect  Labs Reviewed  POCT URINALYSIS DIP  (MANUAL ENTRY) - Abnormal; Notable for the following components:      Result Value   Blood, UA moderate (*)    Protein Ur, POC trace (*)    Nitrite, UA Positive (*)    Leukocytes, UA Large (3+) (*)    All other components within normal limits    No Known Allergies  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   On Valtrex-confirm taking prophylaxis > 34 wks   HSV-2 infection    No hx of outbreak   Hx of preeclampsia, prior pregnancy, currently pregnant, third trimester 11/09/2016   Postpartum Normal CMP at baseline Took ASA for several weeks but then stopped   Hypertension    Kidney infection    kidney infection 2012   Migraine    Social History   Socioeconomic History   Marital status: Single    Spouse name: Not on file   Number of children: 2   Years of education: 12   Highest education level: High school graduate  Occupational History   Occupation: customer service  Tobacco Use   Smoking status: Every Day    Types: Cigars   Smokeless tobacco: Never   Tobacco comments:    black and mild - one every day  Vaping Use  Vaping Use: Never used  Substance and Sexual Activity   Alcohol use: Yes    Alcohol/week: 2.0 standard drinks of alcohol    Types: 2 Glasses of wine per week    Comment: occasionally   Drug use: No   Sexual activity: Yes    Birth control/protection: None  Other Topics Concern   Not on file  Social History Narrative   Lives at home with her children.   Right-handed.   Caffeine use: 2 cups per day.   Social Determinants of Health   Financial Resource Strain: Not on file  Food Insecurity: Not on file  Transportation Needs: Not on file  Physical Activity: Not on file  Stress: Not on file  Social Connections: Not on file  Intimate Partner  Violence: Not on file   Family History  Problem Relation Age of Onset   Hypertension Mother    Migraines Mother    Healthy Father    Asthma Brother    Arthritis Maternal Grandmother    Cancer Maternal Grandmother    Arthritis Paternal Grandmother    Anesthesia problems Neg Hx         Mardella Layman, MD 10/20/22 [phone]

## 2022-11-07 ENCOUNTER — Encounter: Payer: Self-pay | Admitting: Obstetrics & Gynecology

## 2022-11-07 ENCOUNTER — Ambulatory Visit (INDEPENDENT_AMBULATORY_CARE_PROVIDER_SITE_OTHER): Payer: Medicaid Other | Admitting: Obstetrics & Gynecology

## 2022-11-07 VITALS — BP 127/85 | HR 91 | Wt 165.7 lb

## 2022-11-07 DIAGNOSIS — Z30011 Encounter for initial prescription of contraceptive pills: Secondary | ICD-10-CM

## 2022-11-07 DIAGNOSIS — N926 Irregular menstruation, unspecified: Secondary | ICD-10-CM | POA: Diagnosis not present

## 2022-11-07 LAB — POCT URINE PREGNANCY: Preg Test, Ur: NEGATIVE

## 2022-11-07 MED ORDER — NORGESTIMATE-ETH ESTRADIOL 0.25-35 MG-MCG PO TABS: 1.0000 | ORAL_TABLET | Freq: Every day | ORAL | 11 refills | Status: DC

## 2022-11-07 NOTE — Progress Notes (Signed)
Pt is in the office for reporting irregular cycles. Currently taking Micronor BC pills at the same time each day as prescribed. Reports cycle came on May 2 and came back on heavier around May 18th.  Reports only changes were that she recently took antibiotics for a bladder infection. She has stopped nursing and she would like to continue on a combination OCP. We discussed cycle control will be better on this regimen and I made the change. She may return for annual exam in the next few months. Meds ordered this encounter  Medications   norgestimate-ethinyl estradiol (ORTHO-CYCLEN) 0.25-35 MG-MCG tablet    Sig: Take 1 tablet by mouth daily.    Dispense:  28 tablet    Refill:  11   Adam Phenix, MD 11/07/2022

## 2022-11-17 DIAGNOSIS — Z419 Encounter for procedure for purposes other than remedying health state, unspecified: Secondary | ICD-10-CM | POA: Diagnosis not present

## 2022-11-20 ENCOUNTER — Other Ambulatory Visit: Payer: Self-pay | Admitting: Obstetrics and Gynecology

## 2022-12-04 ENCOUNTER — Ambulatory Visit: Payer: Medicaid Other | Admitting: Neurology

## 2022-12-17 DIAGNOSIS — Z419 Encounter for procedure for purposes other than remedying health state, unspecified: Secondary | ICD-10-CM | POA: Diagnosis not present

## 2023-01-17 DIAGNOSIS — Z419 Encounter for procedure for purposes other than remedying health state, unspecified: Secondary | ICD-10-CM | POA: Diagnosis not present

## 2023-01-29 NOTE — Progress Notes (Unsigned)
Virtual Visit via Video Note  Consent was obtained for video visit:  Yes.   Answered questions that patient had about telehealth interaction:  Yes.   I discussed the limitations, risks, security and privacy concerns of performing an evaluation and management service by telemedicine. I also discussed with the patient that there may be a patient responsible charge related to this service. The patient expressed understanding and agreed to proceed.  Pt location: Home Physician Location: office Name of referring provider:  Rometta Emery, MD I connected with [name] at patients initiation/request on 01/30/2023 at  3:30 PM EDT by video enabled telemedicine application and verified that I am speaking with the correct person using two identifiers. Pt MRN:  [id number] Pt DOB:  1991-04-01 Video Participants:  [name]  Assessment/Plan:   Migraine with aura, without status migrainosus, not intractable Paresthesias unclear etiology but they are infrequent - continue to monitor   Migraine prevention:  Propranolol ER 60mg  daily Migraine rescue:  Excedrin or Tylenol Limit use of pain relievers to no more than 2 days out of week to prevent risk of rebound or medication-overuse headache. Keep headache diary Follow up 6 months.      Subjective:   Stacey [name] is a 32 year old right-handed+ female with hypertension and alpha thalassemia silent carrier who follows up for migraines.  UPDATE: B12 and TSH from 08/14/2022 were 402 and 0.91 respectively.  Still notes it occasionally in hands and feet.    Started propranolol. Improved.   Intensity:  8/10 Duration:  30-60 minutes with Excedrin or Tylenol Frequency:  2 days a month Current NSAIDS/analgesics:  Excedrin Migraine, Extra-strength Tylenol Current triptans: none Current ergotamine:  none Current anti-emetic:  none Current muscle relaxants:  none Current Antihypertensive medications:  Propranolol ER 60mg  daily Current  Antidepressant medications:  none Current Anticonvulsant medications:  none Current anti-CGRP:  none Current Vitamins/Herbal/Supplements:  magnesium citrate 400mg  daily Current Antihistamines/Decongestants:  none Other therapy:  none Birth control:  Ortho-Cyclen       Caffeine:  Rarely coffee.  No soda or tea Alcohol:  occasionally glass of wine Smoker:  smokes 3 cigars a day Diet:  Increased water intake.  Sometimes skips meals (mainly breakfast) Exercise:  no Depression:  overall stable; Anxiety:  overall stable Other pain:  back pain Sleep hygiene:  good.   HISTORY: Migraines since [age] years old.  Typically they are left retro-orbital burning/pressure and sometimes throbbing pain with associated seeing spots.  Some photophobia and phonophobia, sometimes nausea.  Takes Excedrin or Extra-strength Tylenol and takes a nap - resolved in 2-3 hours.  Occurs 1 to 2 times a week.  Triggers:  smoking, menses.   Seen in the ED on 08/01/2022 for a particularly severe headache that was ongoing for 3 days.  Also, this was different because it was associated with dizziness, off-balance and located mostly in back of there head.  CT head personally reviewed was unremarkable.  Treated with migraine cocktail which worked.  Has not had a recurrence of that severe migraine.   Past NSAIDS/analgesics:  Fioricet, Fioricet with codeine, naproxen, meloxicam, tramadol Past abortive triptans:  sumatriptan tab (made her feel ill) Past abortive ergotamine:  none Past muscle relaxants:  Flexeril, Robaxin, Zanflex Past anti-emetic:  Zofran 4mg , Phenergan 25mg  Past antihypertensive medications:  nifedipine Past antidepressant medications:  Lexapro (aggravated headaches) Past anticonvulsant medications:  none Past anti-CGRP:  none Past vitamins/Herbal/Supplements:  magnesium, riboflavin Past antihistamines/decongestants: Claritin, Dayquiul Other past therapies:  none    Family history of headache:  mom,  maternal grandmother, brothers, father, paternal grandmother  Past Medical History: 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 (Patient not taking: Reported on 11/07/2022)   norgestimate-ethinyl estradiol (ORTHO-CYCLEN) 0.25-35 MG-MCG tablet Take 1 tablet by mouth daily.   propranolol ER (INDERAL LA) 60 MG 24 hr capsule Take 1 capsule (60 mg total) by mouth daily. (Patient not taking: Reported on 11/07/2022)   SUMAtriptan (IMITREX) 100 MG tablet Take 1 tablet (100 mg total) by mouth as needed for migraine. May repeat in 2 hours if headache persists or recurs.  Maximum 2 tablets in 24 hours. (Patient not taking: Reported on 11/07/2022)   No facility-administered encounter medications on file as of 01/30/2023.    Allergies: No Known Allergies  Family History: Family History  Problem Relation Age of Onset   Hypertension Mother    Migraines Mother    Healthy Father    Asthma Brother    Arthritis Maternal Grandmother    Cancer Maternal Grandmother    Arthritis Paternal Grandmother    Anesthesia problems Neg Hx     Observations/Objective:   No acute distress.  Alert and oriented.  Speech fluent and not dysarthric.  Language intact.     Follow Up Instructions:    -I  discussed the assessment and treatment plan with the patient. The patient was provided an opportunity to ask questions and all were answered. The patient agreed with the plan and demonstrated an understanding of the instructions.   The patient was advised to call back or seek an in-person evaluation if the symptoms worsen or if the condition fails to improve as anticipated.   Cira Servant, DO   CC: Earlie Lou, MD

## 2023-01-30 ENCOUNTER — Encounter: Payer: Self-pay | Admitting: Neurology

## 2023-01-30 ENCOUNTER — Telehealth (INDEPENDENT_AMBULATORY_CARE_PROVIDER_SITE_OTHER): Payer: Medicaid Other | Admitting: Neurology

## 2023-01-30 DIAGNOSIS — G43109 Migraine with aura, not intractable, without status migrainosus: Secondary | ICD-10-CM

## 2023-01-30 DIAGNOSIS — R202 Paresthesia of skin: Secondary | ICD-10-CM | POA: Diagnosis not present

## 2023-01-30 MED ORDER — PROPRANOLOL HCL ER 60 MG PO CP24: 60.0000 mg | ORAL_CAPSULE | Freq: Every day | ORAL | 5 refills | Status: DC

## 2023-02-07 ENCOUNTER — Ambulatory Visit: Payer: Medicaid Other | Admitting: Neurology

## 2023-02-17 DIAGNOSIS — Z419 Encounter for procedure for purposes other than remedying health state, unspecified: Secondary | ICD-10-CM | POA: Diagnosis not present

## 2023-02-19 ENCOUNTER — Encounter (HOSPITAL_COMMUNITY): Payer: Self-pay

## 2023-02-19 ENCOUNTER — Ambulatory Visit (HOSPITAL_COMMUNITY)
Admission: EM | Admit: 2023-02-19 | Discharge: 2023-02-19 | Disposition: A | Discharge status: Home / Self Care | Payer: Medicaid Other | Attending: Physician Assistant | Admitting: Physician Assistant

## 2023-02-19 DIAGNOSIS — K649 Unspecified hemorrhoids: Secondary | ICD-10-CM

## 2023-02-19 MED ORDER — HYDROCORTISONE (PERIANAL) 2.5 % EX CREA: 1.0000 | TOPICAL_CREAM | Freq: Two times a day (BID) | CUTANEOUS | 0 refills | Status: DC

## 2023-02-19 NOTE — ED Triage Notes (Addendum)
Patient c/o hemorrhoid x 2 days. Patient reports that she is having bleeding and  pain. Patient states she is to the point where she has blood instead of a BM when she wipes.  Patient states she has used Preparaatin H and wipes with Preparation H and witch Hazel, sitz baths, and epsom salt soaks

## 2023-02-19 NOTE — ED Provider Notes (Signed)
[facility]    CSN: [id number] Arrival date & time: 02/19/23  1815      History   Chief Complaint Chief Complaint  Patient presents with   Hemorrhoids    HPI Stacey [name] is a 32 y.o. female.   Patient here today for evaluation of hemorrhoid pain.  She states she has had hemorrhoids for 2 days and that she has had some bleeding and discomfort.  She has tried using Preparation H and sitz bath's without improvement.  She is also used witch hazel and Preparation H wipes.  She denies any fever.  The history is provided by the patient.    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   On Valtrex-confirm taking prophylaxis > 34 wks   HSV-2 infection    No hx of outbreak   Hx of preeclampsia, prior pregnancy, currently pregnant, third trimester 11/09/2016   Postpartum Normal CMP at baseline Took ASA for several weeks but then stopped   Hypertension    Kidney infection    kidney infection 2012   Migraine     Patient Active Problem List   Diagnosis Date Noted   Indication for care or intervention in labor or delivery 10/19/2021   GBS (group B Streptococcus carrier), +RV culture, currently pregnant 10/09/2021   HSV infection 09/13/2021   History of pre-eclampsia in prior pregnancy, currently pregnant 08/02/2021   History of ELISA positive for HSV 08/02/2021   Gastric reflux 07/12/2021   Supervision of other normal pregnancy, antepartum 04/03/2021   History of shoulder dystocia in prior pregnancy, currently pregnant 04/12/2019   Migraines 03/02/2019   Alpha thalassemia silent carrier 10/11/2018   Major depressive disorder, recurrent, severe without psychotic features (HCC)     Past Surgical History:  Procedure Laterality Date   NO PAST SURGERIES     TOOTH  EXTRACTION N/A 07/13/2022   Procedure: DENTAL RESTORATION/EXTRACTIONS;  Surgeon: Ocie Doyne, DMD;  Location: [facility] OR;  Service: Oral Surgery;  Laterality: N/A;    OB History     Gravida  7   Para  4   Term  4   Preterm  0   AB  3   Living  4      SAB  0   IAB  2   Ectopic  0   Multiple  0   Live Births  4            Home Medications    Prior to Admission medications   Medication Sig Start Date End Date Taking? Authorizing Provider  hydrocortisone (ANUSOL-HC) 2.5 % rectal cream Place 1 Application rectally 2 (two) times daily. 02/19/23   Tomi Bamberger, PA-C  ibuprofen (ADVIL) 800 MG tablet Take 1 tablet (800 mg total) by mouth 3 (three) times daily with meals. Patient not taking: Reported on 01/30/2023 10/17/22   Mardella Layman, MD  norgestimate-ethinyl estradiol (ORTHO-CYCLEN) 0.25-35 MG-MCG tablet Take 1 tablet by mouth daily. 11/07/22   Adam Phenix, MD  propranolol ER (INDERAL LA) 60 MG 24 hr capsule Take 1 capsule (60 mg total) by mouth daily. 01/30/23   Drema Dallas, DO    Family History Family History  Problem Relation Age of Onset   Hypertension Mother    Migraines Mother  Healthy Father    Asthma Brother    Arthritis Maternal Grandmother    Cancer Maternal Grandmother    Arthritis Paternal Grandmother    Anesthesia problems Neg Hx     Social History Social History   Tobacco Use   Smoking status: Every Day    Types: Cigars   Smokeless tobacco: Never   Tobacco comments:    black and mild - one every day  Vaping Use   Vaping status: Never Used  Substance Use Topics   Alcohol use: Yes    Alcohol/week: 2.0 standard drinks of alcohol    Types: 2 Glasses of wine per week    Comment: occasionally   Drug use: No     Allergies   Patient has no known allergies.   Review of Systems Review of Systems  Constitutional:  Negative for chills and fever.  Eyes:  Negative for discharge and redness.  Gastrointestinal:  Positive for anal  bleeding. Negative for abdominal pain, nausea and vomiting.     Physical Exam Triage Vital Signs ED Triage Vitals  Encounter Vitals Group     BP 02/19/23 1915 120/81     Systolic BP Percentile --      Diastolic BP Percentile --      Pulse Rate 02/19/23 1915 81     Resp 02/19/23 1915 16     Temp 02/19/23 1915 98.8 F (37.1 C)     Temp Source 02/19/23 1915 Oral     SpO2 02/19/23 1915 98 %     Weight --      Height --      Head Circumference --      Peak Flow --      Pain Score 02/19/23 1916 8     Pain Loc --      Pain Education --      Exclude from Growth Chart --    No data found.  Updated Vital Signs BP 120/81 (BP Location: Right Arm)   Pulse 81   Temp 98.8 F (37.1 C) (Oral)   Resp 16   LMP 01/30/2023   SpO2 98%      Physical Exam Vitals and nursing note reviewed.  Constitutional:      General: She is not in acute distress.    Appearance: Normal appearance. She is not ill-appearing.  HENT:     Head: Normocephalic and atraumatic.  Eyes:     Conjunctiva/sclera: Conjunctivae normal.  Cardiovascular:     Rate and Rhythm: Normal rate.  Pulmonary:     Effort: Pulmonary effort is normal. No respiratory distress.  Genitourinary:    Comments: Cluster of hemorrhoids, nonthrombosed, flesh toned noted to anus, no bleeding appreciated Neurological:     Mental Status: She is alert.  Psychiatric:        Mood and Affect: Mood normal.        Behavior: Behavior normal.        Thought Content: Thought content normal.      UC Treatments / Results  Labs (all labs ordered are listed, but only abnormal results are displayed) Labs Reviewed - No data to display  EKG   Radiology No results found.  Procedures Procedures (including critical care time)  Medications Ordered in UC Medications - No data to display  Initial Impression / Assessment and Plan / UC Course  I have reviewed the triage vital signs and the nursing notes.  Pertinent labs & imaging results  that were available during my care of the  patient were reviewed by me and considered in my medical decision making (see chart for details).    Will trial Anusol for hopeful improvement.  Encouraged follow-up in emergency room with any worsening.  Patient expresses understanding.  Final Clinical Impressions(s) / UC Diagnoses   Final diagnoses:  Hemorrhoids, unspecified hemorrhoid type   Discharge Instructions   None    ED Prescriptions     Medication Sig Dispense Auth. Provider   hydrocortisone (ANUSOL-HC) 2.5 % rectal cream  (Status: Discontinued) Place 1 Application rectally 2 (two) times daily. 30 g Erma Pinto F, PA-C   hydrocortisone (ANUSOL-HC) 2.5 % rectal cream Place 1 Application rectally 2 (two) times daily. 30 g Tomi Bamberger, PA-C      PDMP not reviewed this encounter.   Tomi Bamberger, PA-C 02/19/23 1930

## 2023-03-12 ENCOUNTER — Other Ambulatory Visit (HOSPITAL_COMMUNITY)
Admission: RE | Admit: 2023-03-12 | Discharge: 2023-03-12 | Disposition: A | Discharge status: Home / Self Care | Payer: Medicaid Other | Source: Ambulatory Visit | Attending: Obstetrics and Gynecology | Admitting: Obstetrics and Gynecology

## 2023-03-12 ENCOUNTER — Ambulatory Visit: Payer: Medicaid Other

## 2023-03-12 ENCOUNTER — Ambulatory Visit: Payer: Medicaid Other | Admitting: Emergency Medicine

## 2023-03-12 VITALS — BP 124/85 | HR 84 | Wt 157.7 lb

## 2023-03-12 DIAGNOSIS — R1030 Lower abdominal pain, unspecified: Secondary | ICD-10-CM | POA: Diagnosis not present

## 2023-03-12 DIAGNOSIS — Z113 Encounter for screening for infections with a predominantly sexual mode of transmission: Secondary | ICD-10-CM

## 2023-03-12 DIAGNOSIS — N898 Other specified noninflammatory disorders of vagina: Secondary | ICD-10-CM | POA: Diagnosis not present

## 2023-03-12 LAB — POCT URINALYSIS DIPSTICK
Bilirubin, UA: 0.2
Blood, UA: NEGATIVE
Glucose, UA: NEGATIVE
Ketones, UA: NEGATIVE
Leukocytes, UA: NEGATIVE
Nitrite, UA: NEGATIVE
Protein, UA: NEGATIVE
Spec Grav, UA: 1.02 (ref 1.010–1.025)
Urobilinogen, UA: 0.2 E.U./dL
pH, UA: 6 (ref 5.0–8.0)

## 2023-03-12 NOTE — Progress Notes (Addendum)
SUBJECTIVE:  32 y.o. female complains of white and mucoid vaginal discharge for 3 week(s). Denies abnormal vaginal bleeding or significant pelvic pain or fever. No UTI symptoms. Denies history of known exposure to STD.  Patient's last menstrual period was 01/30/2023.  OBJECTIVE:  She appears well, afebrile. Urine dipstick: negative for all components.  ASSESSMENT:  Vaginal Discharge  Vaginal Odor   PLAN:  GC, chlamydia, trichomonas, BVAG, CVAG probe sent to lab. Treatment: To be determined once lab results are received ROV prn if symptoms persist or worsen.

## 2023-03-13 LAB — CERVICOVAGINAL ANCILLARY ONLY
Bacterial Vaginitis (gardnerella): NEGATIVE
Candida Glabrata: NEGATIVE
Candida Vaginitis: POSITIVE — AB
Chlamydia: NEGATIVE
Comment: NEGATIVE
Comment: NEGATIVE
Comment: NEGATIVE
Comment: NEGATIVE
Comment: NEGATIVE
Comment: NORMAL
Neisseria Gonorrhea: NEGATIVE
Trichomonas: NEGATIVE

## 2023-03-13 LAB — RPR: RPR Ser Ql: NONREACTIVE

## 2023-03-13 LAB — HIV ANTIBODY (ROUTINE TESTING W REFLEX): HIV Screen 4th Generation wRfx: NONREACTIVE

## 2023-03-13 LAB — HEPATITIS C ANTIBODY: Hep C Virus Ab: NONREACTIVE

## 2023-03-13 LAB — HEPATITIS B SURFACE ANTIGEN: Hepatitis B Surface Ag: NEGATIVE

## 2023-03-14 LAB — URINE CULTURE: Organism ID, Bacteria: NO GROWTH

## 2023-03-14 IMAGING — US US OB TRANSVAGINAL
1 series · 15 of 28 positions shown · non-contrast
Comparison: 02/20/2021
COMPARISON: 02/20/2021

Addendum:
CLINICAL DATA: Left abdominal pain

EXAM:
TRANSVAGINAL OB ULTRASOUND
TECHNIQUE: Transvaginal ultrasound was performed for complete evaluation of the
gestation as well as the maternal uterus, adnexal regions, and
pelvic cul-de-sac.

[Series 1: us ob transvaginal · 15 of 31 slices shown]
[im 1/31]
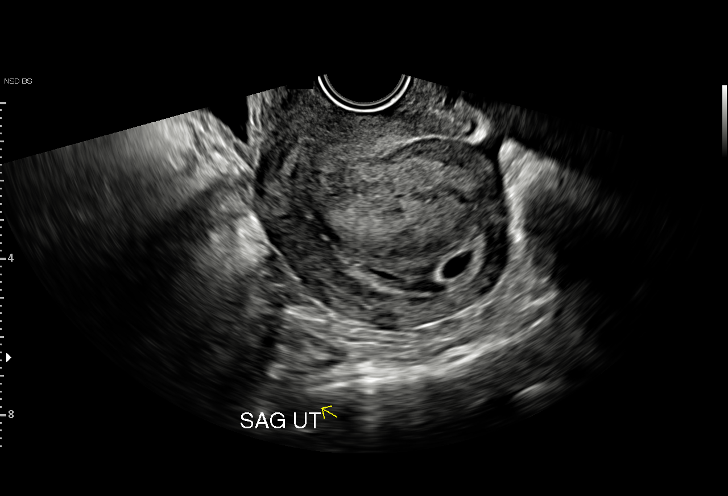
[im 3/31]
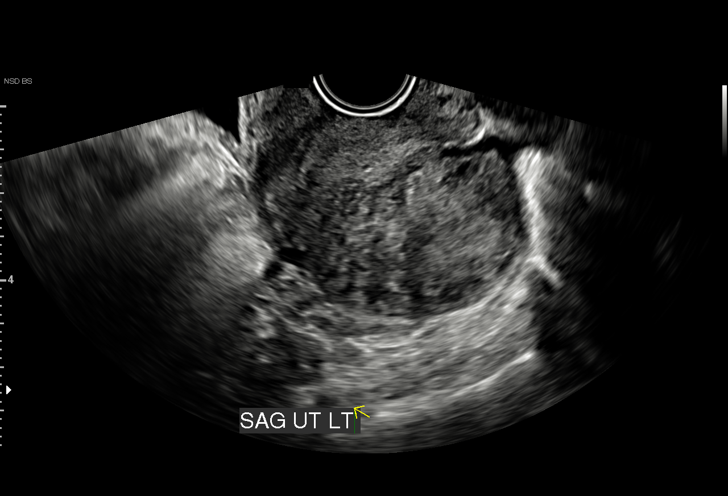
[im 5/31]
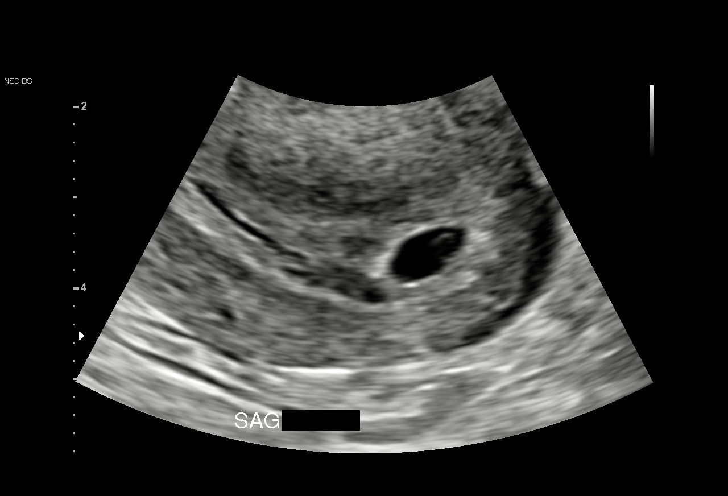
[im 7/31]
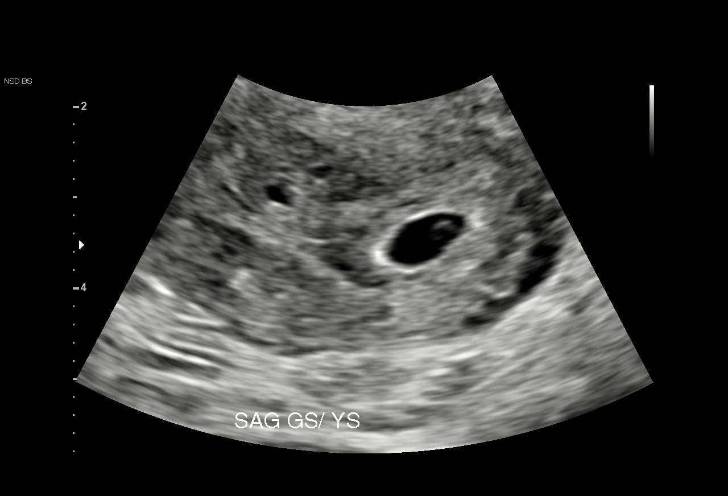
[im 9/31]
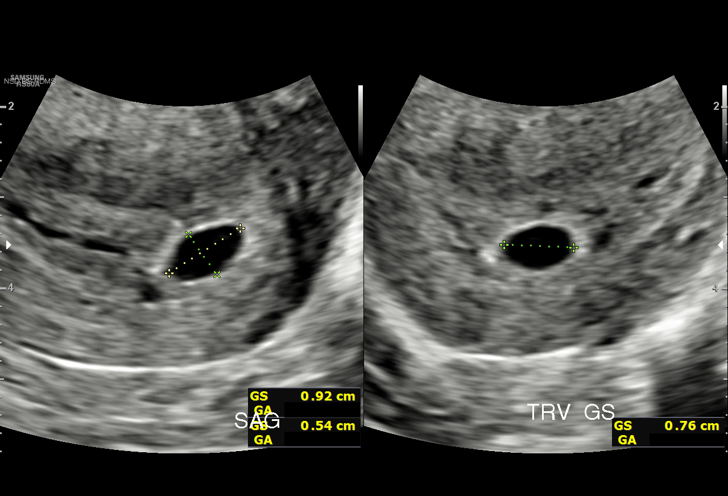
[im 12/31]
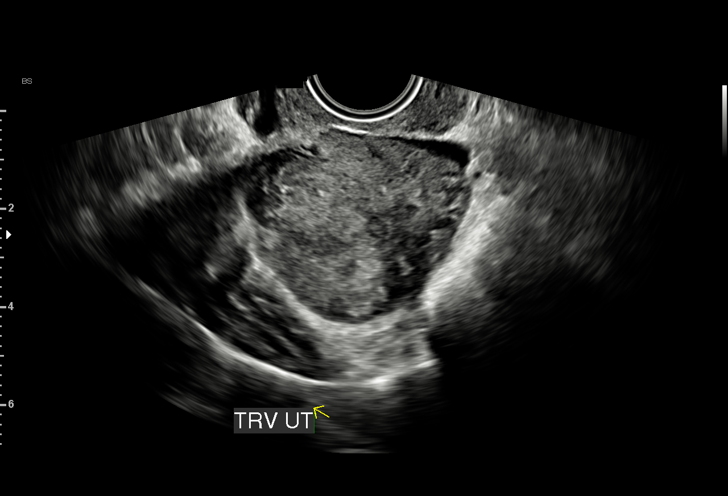
[im 14/31]
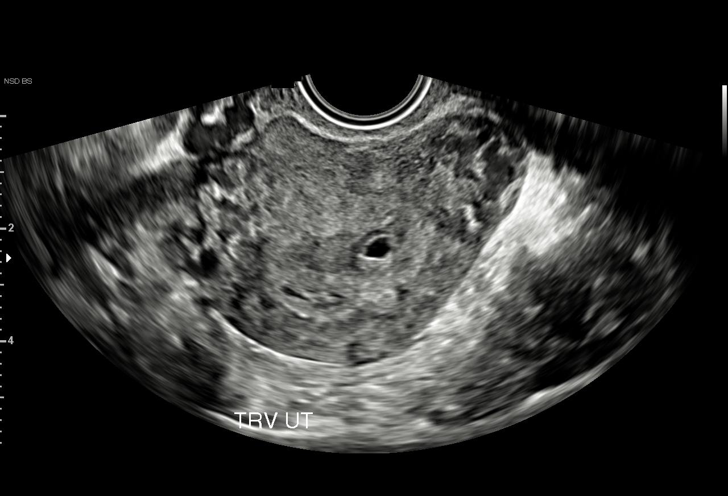
[im 16/31]
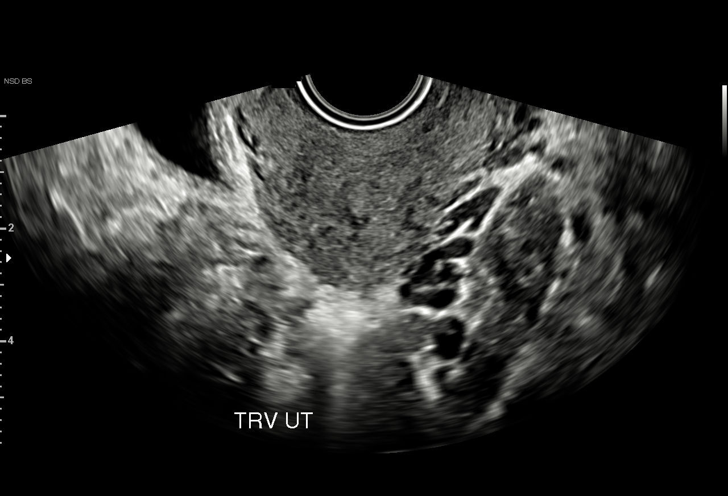
[im 17/31]
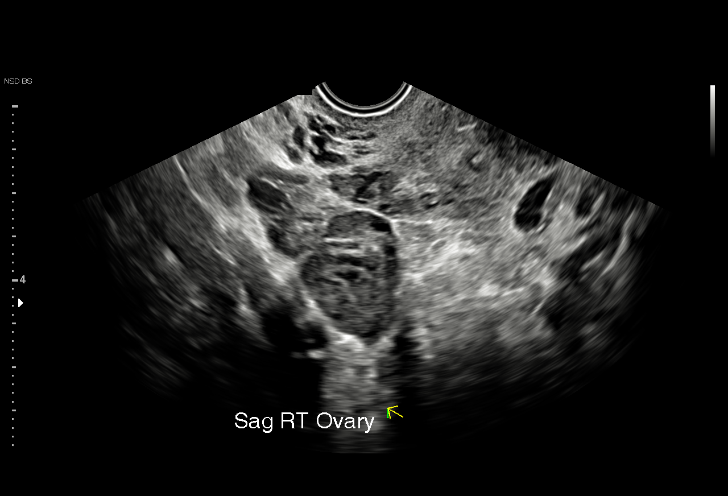
[im 19/31]
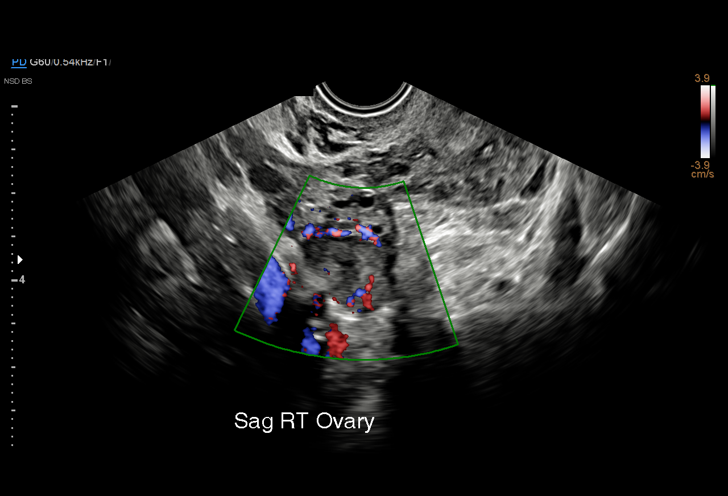
[im 22/31]
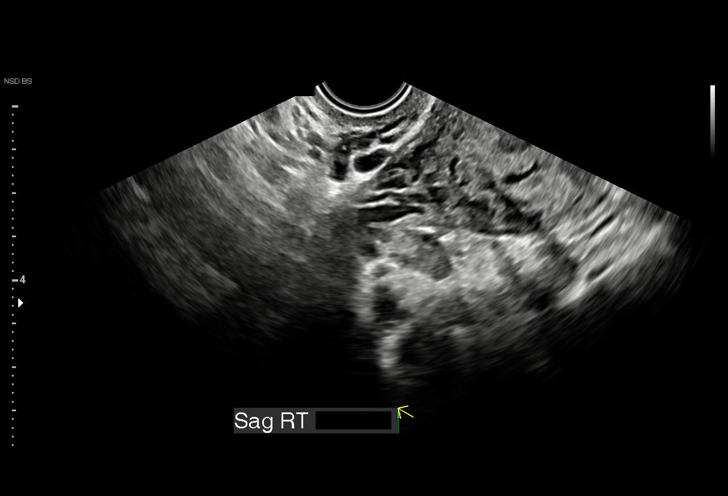
[im 24/31]
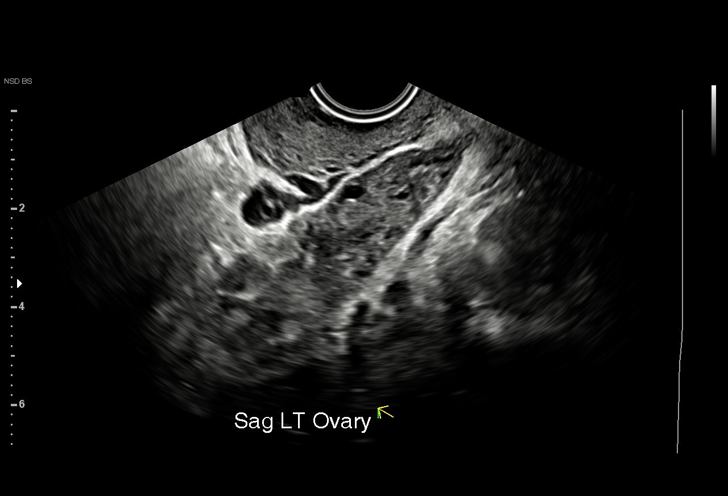
[im 26/31]
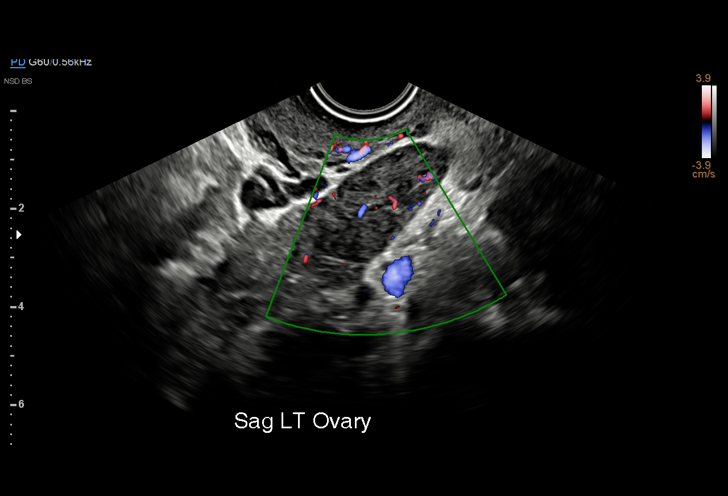
[im 28/31]
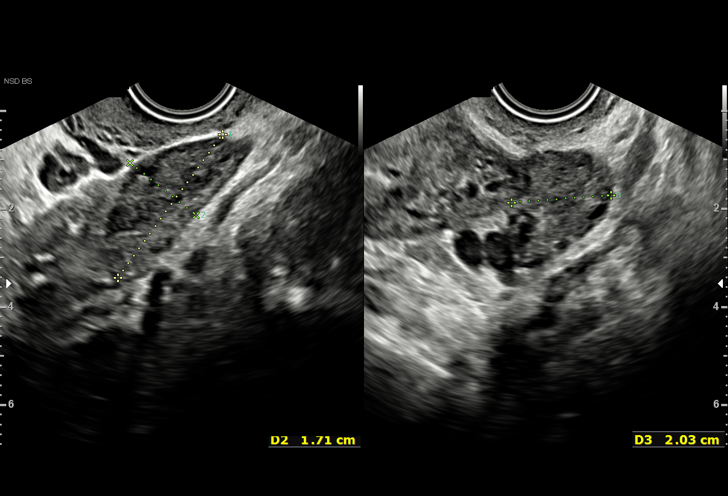
[im 31/31]
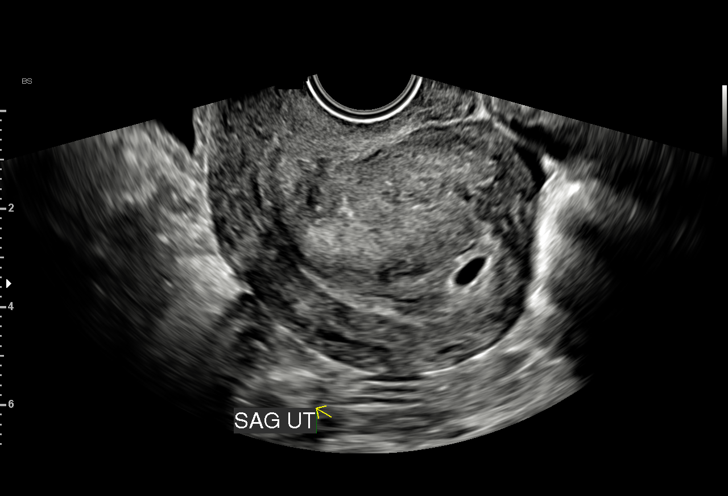

[15 of 28 positions shown; findings below may reference images not displayed]

FINDINGS: Intrauterine gestational sac: Single intrauterine gestational sac

Yolk sac:  Visualized

Embryo:  Visualized

MSD: 7.4 mm   5 w   3 d

Subchorionic hemorrhage: Possible small subchorionic hemorrhage
along the inferior sac.

Maternal uterus/adnexae: Ovaries are within normal limits. The left
ovary measures 2 x 3.6 x 1.7 cm. The right ovary measures 2.6 x
by 3.8 cm. Small free fluid in the pelvis
IMPRESSION: 1. Single intrauterine pregnancy with visible gestational sac and
yolk sac but no embryo. Suggest sonographic follow-up in 10-14 days
to confirm viability.
2. Small free fluid in the pelvis. Possible small subchorionic
hemorrhage.

ADDENDUM:
Correction to findings.

Under embryo section, findings should read not visualized.
Impression is correct, there is visualized gestational sac and yolk
sac but no embryo.

*** End of Addendum ***
FINDINGS: Intrauterine gestational sac: Single intrauterine gestational sac

Yolk sac:  Visualized

Embryo:  Visualized

MSD: 7.4 mm   5 w   3 d

Subchorionic hemorrhage: Possible small subchorionic hemorrhage
along the inferior sac.

Maternal uterus/adnexae: Ovaries are within normal limits. The left
ovary measures 2 x 3.6 x 1.7 cm. The right ovary measures 2.6 x
by 3.8 cm. Small free fluid in the pelvis
IMPRESSION: 1. Single intrauterine pregnancy with visible gestational sac and
yolk sac but no embryo. Suggest sonographic follow-up in 10-14 days
to confirm viability.
2. Small free fluid in the pelvis. Possible small subchorionic
hemorrhage.

## 2023-03-19 DIAGNOSIS — Z419 Encounter for procedure for purposes other than remedying health state, unspecified: Secondary | ICD-10-CM | POA: Diagnosis not present

## 2023-04-01 ENCOUNTER — Emergency Department (HOSPITAL_COMMUNITY)
Admission: EM | Admit: 2023-04-01 | Discharge: 2023-04-02 | Disposition: A | Discharge status: Home / Self Care | Payer: Medicaid Other | Attending: Emergency Medicine | Admitting: Emergency Medicine

## 2023-04-01 ENCOUNTER — Encounter (HOSPITAL_COMMUNITY): Payer: Self-pay

## 2023-04-01 ENCOUNTER — Other Ambulatory Visit: Payer: Self-pay

## 2023-04-01 ENCOUNTER — Emergency Department (HOSPITAL_COMMUNITY): Payer: Medicaid Other

## 2023-04-01 DIAGNOSIS — R109 Unspecified abdominal pain: Secondary | ICD-10-CM

## 2023-04-01 DIAGNOSIS — R0789 Other chest pain: Secondary | ICD-10-CM

## 2023-04-01 DIAGNOSIS — R0602 Shortness of breath: Secondary | ICD-10-CM | POA: Diagnosis not present

## 2023-04-01 DIAGNOSIS — R079 Chest pain, unspecified: Secondary | ICD-10-CM | POA: Diagnosis not present

## 2023-04-01 LAB — CBC WITH DIFFERENTIAL/PLATELET
Abs Immature Granulocytes: 0.01 10*3/uL (ref 0.00–0.07)
Basophils Absolute: 0 10*3/uL (ref 0.0–0.1)
Basophils Relative: 1 %
Eosinophils Absolute: 0.3 10*3/uL (ref 0.0–0.5)
Eosinophils Relative: 5 %
HCT: 35.1 % — ABNORMAL LOW (ref 36.0–46.0)
Hemoglobin: 11.4 g/dL — ABNORMAL LOW (ref 12.0–15.0)
Immature Granulocytes: 0 %
Lymphocytes Relative: 44 %
Lymphs Abs: 2.5 10*3/uL (ref 0.7–4.0)
MCH: 26.5 pg (ref 26.0–34.0)
MCHC: 32.5 g/dL (ref 30.0–36.0)
MCV: 81.4 fL (ref 80.0–100.0)
Monocytes Absolute: 0.3 10*3/uL (ref 0.1–1.0)
Monocytes Relative: 6 %
Neutro Abs: 2.5 10*3/uL (ref 1.7–7.7)
Neutrophils Relative %: 44 %
Platelets: 355 10*3/uL (ref 150–400)
RBC: 4.31 MIL/uL (ref 3.87–5.11)
RDW: 13.8 % (ref 11.5–15.5)
WBC: 5.7 10*3/uL (ref 4.0–10.5)
nRBC: 0 % (ref 0.0–0.2)

## 2023-04-01 LAB — URINALYSIS, W/ REFLEX TO CULTURE (INFECTION SUSPECTED)
Bacteria, UA: NONE SEEN
Bilirubin Urine: NEGATIVE
Glucose, UA: NEGATIVE mg/dL
Ketones, ur: NEGATIVE mg/dL
Leukocytes,Ua: NEGATIVE
Nitrite: NEGATIVE
Protein, ur: NEGATIVE mg/dL
Specific Gravity, Urine: 1.019 (ref 1.005–1.030)
pH: 6 (ref 5.0–8.0)

## 2023-04-01 LAB — COMPREHENSIVE METABOLIC PANEL
ALT: 17 U/L (ref 0–44)
AST: 18 U/L (ref 15–41)
Albumin: 3.9 g/dL (ref 3.5–5.0)
Alkaline Phosphatase: 51 U/L (ref 38–126)
Anion gap: 9 (ref 5–15)
BUN: 9 mg/dL (ref 6–20)
CO2: 23 mmol/L (ref 22–32)
Calcium: 9.3 mg/dL (ref 8.9–10.3)
Chloride: 108 mmol/L (ref 98–111)
Creatinine, Ser: 0.81 mg/dL (ref 0.44–1.00)
GFR, Estimated: >60 mL/min (ref >60)
Glucose, Bld: 101 mg/dL — ABNORMAL HIGH (ref 70–99)
Potassium: 3.6 mmol/L (ref 3.5–5.1)
Sodium: 140 mmol/L (ref 135–145)
Total Bilirubin: 0.6 mg/dL (ref 0.3–1.2)
Total Protein: 7 g/dL (ref 6.5–8.1)

## 2023-04-01 LAB — LIPASE, BLOOD: Lipase: 38 U/L (ref 11–51)

## 2023-04-01 LAB — TROPONIN I (HIGH SENSITIVITY): Troponin I (High Sensitivity): <2 ng/L (ref <18)

## 2023-04-01 LAB — HCG, SERUM, QUALITATIVE: Preg, Serum: NEGATIVE

## 2023-04-01 NOTE — ED Provider Triage Note (Signed)
Emergency Medicine Provider Triage Evaluation Note  Stacey [name] , a 32 y.o. female  was evaluated in triage.  Pt complains of chest pain and shortness of breath for the last 1-2 weeks.  She reports the pain has been present all day.  She is also complaining of right flank pain into the abdomen.  Endorses nausea and vomiting.  Denies diarrhea.    Review of Systems  Positive: As above Negative: As above  Physical Exam  BP (!) 150/97 (BP Location: Right Arm)   Pulse 89   Temp 98.2 F (36.8 C) (Oral)   Resp 17   Ht 5' (1.524 m)   Wt 73.9 kg   LMP 03/28/2023   SpO2 100%   BMI 31.83 kg/m  Gen:   Awake, no distress   Resp:  Normal effort  MSK:   Moves extremities without difficulty  Other:    Medical Decision Making  Medically screening exam initiated at 9:38 PM.  Appropriate orders placed.  Stacey [name] was informed that the remainder of the evaluation will be completed by another provider, this initial triage assessment does not replace that evaluation, and the importance of remaining in the ED until their evaluation is complete.     Lenard Simmer, [place] 04/01/23 2139

## 2023-04-01 NOTE — ED Triage Notes (Signed)
Pt complaining of shortness of breath for the last 2 weeks, has started having chest pain with the breathing for the last couple of days. She is also hurting on the right flank for the  last couple of days.

## 2023-04-02 LAB — TROPONIN I (HIGH SENSITIVITY): Troponin I (High Sensitivity): <2 ng/L (ref <18)

## 2023-04-02 MED ORDER — CYCLOBENZAPRINE HCL 10 MG PO TABS: 10.0000 mg | ORAL_TABLET | Freq: Three times a day (TID) | ORAL | 0 refills | Status: DC | PRN

## 2023-04-02 MED ORDER — KETOROLAC TROMETHAMINE 30 MG/ML IJ SOLN
30.0000 mg | Freq: Once | INTRAMUSCULAR | Status: AC
  Administered 2023-04-02: 30 mg via INTRAMUSCULAR
  Filled 2023-04-02: qty 1

## 2023-04-02 MED ORDER — NAPROXEN 500 MG PO TABS: 500.0000 mg | ORAL_TABLET | Freq: Two times a day (BID) | ORAL | 0 refills | Status: DC

## 2023-04-02 NOTE — ED Provider Notes (Signed)
[facility]ENC[facility] DEPT [facility] Emergency Department Provider Note MRN:  [id number]  Arrival date & time: 04/02/23     Chief Complaint   Chest pain  History of Present Illness   Stacey [name] is a 32 y.o. year-old female presents to the ED with chief complaint of 1 week of chest pain that wraps around both sides and is reproducible with movement and palpation.  She also has some right flank pain that is reproducible with movement and palpation.  She states that she has experienced this once in the past and it was attributed to muscle strain.  She denies any injuries.  She has tried OTC meds without much relief.  She denies urinary symptoms.  Denies fever, chills, or cough.  Had one episode of vomiting.  History provided by patient.   Review of Systems  Pertinent positive and negative review of systems noted in HPI.    Physical Exam   Vitals:   04/02/23 0215 04/02/23 0230  BP: (!) 128/93 (!) 120/91  Pulse: 89 88  Resp: 15 15  Temp:    SpO2: 97% 97%    CONSTITUTIONAL:  well-appearing, NAD NEURO:  Alert and oriented x 3, CN 3-12 grossly intact EYES:  eyes equal and reactive ENT/NECK:  Supple, no stridor  CARDIO:  normal rate, regular rhythm, appears well-perfused, anterior chest wall tenderness PULM:  No respiratory distress, CTAB GI/GU:  non-distended, no focal abdominal tenderness, mild right flank tenderness MSK/SPINE:  No gross deformities, no edema, moves all extremities  SKIN:  no rash, atraumatic   *Additional and/or pertinent findings included in MDM below  Diagnostic and Interventional Summary    EKG Interpretation Date/Time:  Monday April 01 2023 21:30:28 EDT Ventricular Rate:  98 PR Interval:  146 QRS Duration:  68 QT Interval:  346 QTC Calculation: 441 R Axis:   70  Text Interpretation: Normal sinus rhythm Confirmed by Durwin Nora, Ryan ([id number]) on 04/02/2023 2:07:34 AM       Labs Reviewed  COMPREHENSIVE METABOLIC PANEL - Abnormal; Notable for the  following components:      Result Value   Glucose, Bld 101 (*)    All other components within normal limits  CBC WITH DIFFERENTIAL/PLATELET - Abnormal; Notable for the following components:   Hemoglobin 11.4 (*)    HCT 35.1 (*)    All other components within normal limits  URINALYSIS, W/ REFLEX TO CULTURE (INFECTION SUSPECTED) - Abnormal; Notable for the following components:   Hgb urine dipstick MODERATE (*)    All other components within normal limits  LIPASE, BLOOD  HCG, SERUM, QUALITATIVE  TROPONIN I (HIGH SENSITIVITY)  TROPONIN I (HIGH SENSITIVITY)    DG Chest 2 View  Final Result      Medications  ketorolac (TORADOL) 30 MG/ML injection 30 mg (has no administration in time range)     Procedures  /  Critical Care Procedures  ED Course and Medical Decision Making  I have reviewed the triage vital signs, the nursing notes, and pertinent available records from the EMR.  Social Determinants Affecting Complexity of Care: Patient has no clinically significant social determinants affecting this chief complaint..   ED Course: Clinical Course as of 04/02/23 0254  Tue Apr 02, 2023  0251 Troponin I (High Sensitivity) Trops negative x 2, low risk for ACS [RB]  0251 hCG, serum, qualitative Preg negative [RB]  0251 CBC with Differential(!) Baseline anemia, no leukocytosis [RB]  0251 Comprehensive metabolic panel(!) No significant electrolyte derangement Normal LFTs and t.bili.  Doubt  gallbladder disease [RB]  0252 Urinalysis, w/ Reflex to Culture (Infection Suspected) -Urine, Clean Catch(!) Inconsistent with UTI.  Doubt KS. [RB]  0252 DG Chest 2 View No obvious abnormality [RB]    Clinical Course User Index [RB] Roxy Horseman, PA-C    Medical Decision Making Patient here with 1 week of reproducible chest wall and right flank pain.  Reassuring workup.  Labs discussed above.  I don't think that the patient needs additional imaging or tests tonight.  Will treat with  toradol IM and rx naproxen and flexeril.  Recommend outpatient follow-up.  Highly suspicious of muscle strain.  Kidney stone, gallbladder disease, pyelo thought less likely.  Return precautions discussed.  Risk Prescription drug management.         Consultants: No consultations were needed in caring for this patient.   Treatment and Plan: I considered admission due to patient's initial presentation, but after considering the examination and diagnostic results, patient will not require admission and can be discharged with outpatient follow-up.    Final Clinical Impressions(s) / ED Diagnoses     ICD-10-CM   1. Chest wall pain  R07.89     2. Flank pain  R10.9       ED Discharge Orders          Ordered    cyclobenzaprine (FLEXERIL) 10 MG tablet  3 times daily PRN        04/02/23 0245    naproxen (NAPROSYN) 500 MG tablet  2 times daily        04/02/23 0245              Discharge Instructions Discussed with and Provided to Patient:   Discharge Instructions   None      Roxy Horseman, PA-C 04/02/23 0254    Gloris Manchester, MD 04/03/23 0630

## 2023-04-19 DIAGNOSIS — Z419 Encounter for procedure for purposes other than remedying health state, unspecified: Secondary | ICD-10-CM | POA: Diagnosis not present

## 2023-04-22 ENCOUNTER — Ambulatory Visit: Payer: Medicaid Other | Admitting: Obstetrics and Gynecology

## 2023-05-19 DIAGNOSIS — Z419 Encounter for procedure for purposes other than remedying health state, unspecified: Secondary | ICD-10-CM | POA: Diagnosis not present

## 2023-06-19 DIAGNOSIS — Z419 Encounter for procedure for purposes other than remedying health state, unspecified: Secondary | ICD-10-CM | POA: Diagnosis not present

## 2023-07-20 DIAGNOSIS — Z419 Encounter for procedure for purposes other than remedying health state, unspecified: Secondary | ICD-10-CM | POA: Diagnosis not present

## 2023-08-06 NOTE — Progress Notes (Unsigned)
 Virtual Visit via Video Note  Consent was obtained for video visit:  Yes.   Answered questions that patient had about telehealth interaction:  Yes.   I discussed the limitations, risks, security and privacy concerns of performing an evaluation and management service by telemedicine. I also discussed with the patient that there may be a patient responsible charge related to this service. The patient expressed understanding and agreed to proceed.  Pt location: Home Physician Location: office Name of referring provider:  Rometta Emery, MD I connected with [name] at patients initiation/request on 08/07/2023 at  3:30 PM EST by video enabled telemedicine application and verified that I am speaking with the correct person using two identifiers. Pt MRN:  [id number] Pt DOB:  Jun 17, 1991 Video Participants:  [name]  Assessment/Plan:   Migraine with aura, without status migrainosus, not intractable, increased frequency and severity Paresthesias unclear etiology but they are infrequent - continue to monitor   Migraine prevention:  Increase propranolol ER to 80mg  daily.  Requested update in 4 weeks if no improvement Migraine rescue:  She will try eletriptan 40mg  Limit use of pain relievers to no more than 2 days out of week to prevent risk of rebound or medication-overuse headache. Keep headache diary Follow up 6 months.      Subjective:   Stacey [name] is a 33 year old right-handed+ female with hypertension and alpha thalassemia silent carrier who follows up for migraines.  UPDATE: B12 and TSH from 08/14/2022 were 402 and 0.91 respectively.  Still notes it occasionally in hands and feet.    Migraines have gotten worse in the last month.  Probably work-related stress.  Intensity:  8/10 Duration:  1-2 days with Excedrin, naproxen, Goody powder or Tylenol Frequency:  3 a week Current NSAIDS/analgesics:  Excedrin Migraine, Extra-strength Tylenol, naproxen Current triptans:  none Current ergotamine:  none Current anti-emetic:  none Current muscle relaxants:  none Current Antihypertensive medications:  Propranolol ER 60mg  daily Current Antidepressant medications:  none Current Anticonvulsant medications:  none Current anti-CGRP:  none Current Vitamins/Herbal/Supplements:  magnesium citrate 400mg  daily Current Antihistamines/Decongestants:  none Other therapy:  none Birth control:  Ortho-Cyclen       Caffeine:  Rarely coffee.  No soda or tea Alcohol:  occasionally glass of wine Smoker:  smokes 3 cigars a day Diet:  Increased water intake.  Sometimes skips meals (mainly breakfast) Exercise:  no Depression:  overall stable; Anxiety:  overall stable Other pain:  back pain Sleep hygiene:  good.   HISTORY: Migraines since [age] years old.  Typically they are left retro-orbital burning/pressure and sometimes throbbing pain with associated seeing spots.  Some photophobia and phonophobia, sometimes nausea.  Takes Excedrin or Extra-strength Tylenol and takes a nap - resolved in 2-3 hours.  Occurs 1 to 2 times a week.  Triggers:  smoking, menses.   Seen in the ED on 08/01/2022 for a particularly severe headache that was ongoing for 3 days.  Also, this was different because it was associated with dizziness, off-balance and located mostly in back of there head.  CT head personally reviewed was unremarkable.  Treated with migraine cocktail which worked.  Has not had a recurrence of that severe migraine.   Past NSAIDS/analgesics:  Fioricet, Fioricet with codeine, naproxen, meloxicam, tramadol Past abortive triptans:  sumatriptan tab (made her feel ill), rizatriptan Past abortive ergotamine:  none Past muscle relaxants:  Flexeril, Robaxin, Zanflex Past anti-emetic:  Zofran 4mg , Phenergan 25mg  Past antihypertensive medications:  nifedipine  Past antidepressant medications:  Lexapro (aggravated headaches) Past anticonvulsant medications:  none Past anti-CGRP:  none Past  vitamins/Herbal/Supplements:  magnesium, riboflavin Past antihistamines/decongestants: Claritin, Dayquiul Other past therapies:  none    Family history of headache:  mom, maternal grandmother, brothers, father, paternal grandmother  Past Medical History: 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   hydrocortisone (ANUSOL-HC) 2.5 % rectal cream Place 1 Application rectally 2 (two) times daily. (Patient not taking: Reported on 03/12/2023)   naproxen (NAPROSYN) 500 MG tablet Take 1 tablet (500 mg total) by mouth 2 (two) times daily.   norgestimate-ethinyl estradiol (ORTHO-CYCLEN) 0.25-35 MG-MCG tablet Take 1 tablet by mouth daily.   propranolol ER (INDERAL LA) 60 MG 24 hr capsule Take 1 capsule (60 mg total) by mouth daily.   No facility-administered encounter medications on file as of 08/07/2023.    Allergies: No Known Allergies  Family History: Family History  Problem Relation Age of Onset   Hypertension Mother    Migraines Mother    Healthy Father    Asthma Brother    Arthritis Maternal Grandmother    Cancer Maternal Grandmother    Arthritis Paternal Grandmother    Anesthesia problems Neg Hx     Observations/Objective:   No acute  distress.  Alert and oriented.  Speech fluent and not dysarthric.  Language intact.     Follow Up Instructions:    -I discussed the assessment and treatment plan with the patient. The patient was provided an opportunity to ask questions and all were answered. The patient agreed with the plan and demonstrated an understanding of the instructions.   The patient was advised to call back or seek an in-person evaluation if the symptoms worsen or if the condition fails to improve as anticipated.   Cira Servant, DO   CC: Earlie Lou, MD

## 2023-08-07 ENCOUNTER — Encounter: Payer: Self-pay | Admitting: Neurology

## 2023-08-07 ENCOUNTER — Telehealth: Payer: Medicaid Other | Admitting: Neurology

## 2023-08-07 DIAGNOSIS — G43109 Migraine with aura, not intractable, without status migrainosus: Secondary | ICD-10-CM

## 2023-08-07 MED ORDER — PROPRANOLOL HCL ER 80 MG PO CP24: 80.0000 mg | ORAL_CAPSULE | Freq: Every day | ORAL | 5 refills | Status: AC

## 2023-08-07 MED ORDER — ELETRIPTAN HYDROBROMIDE 40 MG PO TABS: 40.0000 mg | ORAL_TABLET | ORAL | 5 refills | Status: DC | PRN

## 2023-08-07 NOTE — Patient Instructions (Signed)
 Increase propranolol ER to 80mg  daily Try eletriptan for migraine attacks Follow up 6 months

## 2023-08-09 ENCOUNTER — Other Ambulatory Visit: Payer: Self-pay | Admitting: Neurology

## 2023-08-09 ENCOUNTER — Telehealth: Payer: Self-pay

## 2023-08-09 MED ORDER — NURTEC 75 MG PO TBDP: 1.0000 | ORAL_TABLET | Freq: Every day | ORAL | 11 refills | Status: AC | PRN

## 2023-08-09 NOTE — Telephone Encounter (Signed)
 Patient insurance requires a alternative Sumatriptan,Rizatriptan, Nurtec.

## 2023-08-09 NOTE — Telephone Encounter (Signed)
 LMOVM for patient per Dr.Jaffe,  Already tried sumatriptan and rizatriptan.  Let patient know I sent in prescription for Nurtec which will still likely need a prior auth.  May take one day as needed.  Sent to [facility] on [address]     PA team please start a PA for Nurtec.

## 2023-08-17 DIAGNOSIS — Z419 Encounter for procedure for purposes other than remedying health state, unspecified: Secondary | ICD-10-CM | POA: Diagnosis not present

## 2023-08-19 DIAGNOSIS — J020 Streptococcal pharyngitis: Secondary | ICD-10-CM | POA: Diagnosis not present

## 2023-09-28 DIAGNOSIS — Z419 Encounter for procedure for purposes other than remedying health state, unspecified: Secondary | ICD-10-CM | POA: Diagnosis not present

## 2023-10-18 ENCOUNTER — Other Ambulatory Visit: Payer: Self-pay | Admitting: Obstetrics & Gynecology

## 2023-10-18 DIAGNOSIS — N926 Irregular menstruation, unspecified: Secondary | ICD-10-CM

## 2023-10-22 ENCOUNTER — Other Ambulatory Visit: Payer: Self-pay

## 2023-10-22 DIAGNOSIS — N926 Irregular menstruation, unspecified: Secondary | ICD-10-CM

## 2023-10-22 MED ORDER — NORGESTIMATE-ETH ESTRADIOL 0.25-35 MG-MCG PO TABS: 1.0000 | ORAL_TABLET | Freq: Every day | ORAL | 0 refills | Status: DC

## 2023-10-28 ENCOUNTER — Encounter: Payer: Self-pay | Admitting: Advanced Practice Midwife

## 2023-10-28 ENCOUNTER — Ambulatory Visit: Admitting: Advanced Practice Midwife

## 2023-10-28 ENCOUNTER — Other Ambulatory Visit (HOSPITAL_COMMUNITY)
Admission: RE | Admit: 2023-10-28 | Discharge: 2023-10-28 | Disposition: A | Discharge status: Home / Self Care | Source: Ambulatory Visit | Attending: Advanced Practice Midwife | Admitting: Advanced Practice Midwife

## 2023-10-28 VITALS — BP 134/88 | HR 92 | Ht 60.0 in | Wt 166.0 lb

## 2023-10-28 DIAGNOSIS — Z3480 Encounter for supervision of other normal pregnancy, unspecified trimester: Secondary | ICD-10-CM

## 2023-10-28 DIAGNOSIS — N6012 Diffuse cystic mastopathy of left breast: Secondary | ICD-10-CM | POA: Diagnosis not present

## 2023-10-28 DIAGNOSIS — N6312 Unspecified lump in the right breast, upper inner quadrant: Secondary | ICD-10-CM | POA: Diagnosis not present

## 2023-10-28 DIAGNOSIS — Z202 Contact with and (suspected) exposure to infections with a predominantly sexual mode of transmission: Secondary | ICD-10-CM

## 2023-10-28 DIAGNOSIS — Z113 Encounter for screening for infections with a predominantly sexual mode of transmission: Secondary | ICD-10-CM

## 2023-10-28 DIAGNOSIS — N6011 Diffuse cystic mastopathy of right breast: Secondary | ICD-10-CM | POA: Diagnosis not present

## 2023-10-28 DIAGNOSIS — N6321 Unspecified lump in the left breast, upper outer quadrant: Secondary | ICD-10-CM | POA: Diagnosis not present

## 2023-10-28 DIAGNOSIS — Z01419 Encounter for gynecological examination (general) (routine) without abnormal findings: Secondary | ICD-10-CM | POA: Insufficient documentation

## 2023-10-28 DIAGNOSIS — Z419 Encounter for procedure for purposes other than remedying health state, unspecified: Secondary | ICD-10-CM | POA: Diagnosis not present

## 2023-10-28 NOTE — Progress Notes (Unsigned)
 Subjective:     Stacey [name] is a 33 y.o. female here at [facility] for a routine exam.  Current complaints: none, desire for STI screening.  Personal and family health history reviewed: yes.  Do you have a primary care provider? yes Do you feel safe at home? yes  Flowsheet Row Routine Prenatal from 08/02/2021 in [facility] for [facility] Total Score 3       Health Maintenance Due  Topic Date Due   Pneumococcal Vaccine 58-33 Years old (1 of 2 - PCV) Never done   COVID-19 Vaccine (1 - 2024-25 season) Never done     Risk factors for chronic health problems: Smoking: yes cigars everyday Alchohol/how much: Pt BMI: Body mass index is 32.42 kg/m.   Gynecologic History Patient's last menstrual period was 10/17/2023 (exact date). Contraception: OCP (estrogen/progesterone) Last Pap: 09/03/2018. Results were: normal Last mammogram: n/a.   Obstetric History OB History  Gravida Para Term Preterm AB Living  7 4 4  0 3 4  SAB IAB Ectopic Multiple Live Births  0 2 0 0 4    # Outcome Date GA Lbr Len/2nd Weight Sex Type Anes PTL Lv  7 Term 10/19/21 [redacted]w[redacted]d / 00:07 6 lb 13.4 oz (3.1 kg) F Vag-Spont EPI  LIV  6 Term 04/13/19 102w6d 19:37 / 00:18 8 lb 12.4 oz (3.98 kg) M Vag-Spont EPI  LIV  5 Term 05/10/17 [redacted]w[redacted]d 06:06 / 00:17 7 lb 0.5 oz (3.189 kg) M Vag-Spont EPI  LIV  4 AB 2017          3 IAB 10/30/13             Birth Comments: System Generated. Please review and update pregnancy details.  2 Term 11/02/10 [redacted]w[redacted]d  7 lb 3 oz (3.26 kg) F Vag-Spont EPI  LIV     Birth Comments: post partum pre eclampsia  1 IAB              Birth Comments: System Generated. Please review and update pregnancy details.     The following portions of the patient's history were reviewed and updated as appropriate: allergies, current medications, past family history, past medical history, past social history, past surgical history, and problem list.  Review of  Systems Pertinent items noted in HPI and remainder of comprehensive ROS otherwise negative.    Objective:   Today's Vitals   10/28/23 0819  BP: 134/88  Pulse: 92  Weight: 166 lb (75.3 kg)  Height: 5' (1.524 m)  PainSc: 0-No pain   Body mass index is 32.42 kg/m.  VS reviewed, nursing note reviewed,  Constitutional: well developed, well nourished, no distress HEENT: normocephalic, thyroid  without enlargement or mass HEART: RRR, no murmurs rubs/gallops RESP: clear and equal to auscultation bilaterally in all lobes  Breast Exam:  exam performed: right breast with multiple small masses, but one larger in upper inner quadrant, no skin or nipple changes or axillary nodes, left breast with multiple masses, larger one in upper outer quadrant, no skin or nipple changes or axillary nodes Abdomen: soft Neuro: alert and oriented x 3 Skin: warm, dry Psych: affect normal Pelvic exam:Performed: Cervix pink, visually closed, without lesion, scant white creamy discharge, vaginal walls and external genitalia normal Bimanual exam: Cervix 0/long/high, firm, anterior, neg CMT, uterus nontender, nonenlarged, adnexa without tenderness, enlargement, or mass        Assessment/Plan:   1. Encounter for annual routine gynecological examination (Primary)  - Cytology -  PAP( [place])  2. Routine screening for STD  - HIV antibody (with reflex) - RPR - Hepatitis C antibody - Hepatitis B surface antigen - Cervicovaginal ancillary only( [place])  3. Fibrocystic breast changes, bilateral  - US  LIMITED ULTRASOUND INCLUDING AXILLA LEFT BREAST ; Future - US  LIMITED ULTRASOUND INCLUDING AXILLA RIGHT BREAST; Future - US  PELVIC COMPLETE WITH TRANSVAGINAL; Future - MM 3D DIAGNOSTIC MAMMOGRAM BILATERAL BREAST; Future  4. Mass of upper outer quadrant of left breast  - US  LIMITED ULTRASOUND INCLUDING AXILLA LEFT BREAST ; Future  5. Mass of upper inner quadrant of right breast  - US  LIMITED  ULTRASOUND INCLUDING AXILLA RIGHT BREAST; Future     No follow-ups on file.   Arlester Bence, CNM 8:29 AM

## 2023-10-28 NOTE — Progress Notes (Unsigned)
 Wants refill of same Ocs. Ortho-Cyclen. Ran out last Sunday. No intercourse since off Ocs. Wants STD testing today. No c/o.

## 2023-10-29 LAB — CERVICOVAGINAL ANCILLARY ONLY
Bacterial Vaginitis (gardnerella): NEGATIVE
Chlamydia: NEGATIVE
Comment: NEGATIVE
Comment: NEGATIVE
Comment: NEGATIVE
Comment: NORMAL
Neisseria Gonorrhea: NEGATIVE
Trichomonas: NEGATIVE

## 2023-10-29 LAB — RPR: RPR Ser Ql: NONREACTIVE

## 2023-10-29 LAB — HEPATITIS B SURFACE ANTIGEN: Hepatitis B Surface Ag: NEGATIVE

## 2023-10-29 LAB — HIV ANTIBODY (ROUTINE TESTING W REFLEX): HIV Screen 4th Generation wRfx: NONREACTIVE

## 2023-10-29 LAB — HEPATITIS C ANTIBODY: Hep C Virus Ab: NONREACTIVE

## 2023-10-30 LAB — CYTOLOGY - PAP
Adequacy: ABSENT
Comment: NEGATIVE
Diagnosis: NEGATIVE
High risk HPV: NEGATIVE

## 2023-10-31 ENCOUNTER — Telehealth: Payer: Self-pay | Admitting: Licensed Clinical Social Worker

## 2023-10-31 NOTE — Telephone Encounter (Signed)
 [facility] contacted patient on this date and provided BH intro and attempted to schedule [facility] appointment. Pt advised she's not interested in [facility] services at this time.

## 2023-11-08 ENCOUNTER — Ambulatory Visit (HOSPITAL_COMMUNITY)
Admission: RE | Admit: 2023-11-08 | Discharge: 2023-11-08 | Disposition: A | Discharge status: Home / Self Care | Source: Ambulatory Visit | Attending: Advanced Practice Midwife | Admitting: Advanced Practice Midwife

## 2023-11-08 DIAGNOSIS — N6012 Diffuse cystic mastopathy of left breast: Secondary | ICD-10-CM | POA: Diagnosis not present

## 2023-11-08 DIAGNOSIS — R102 Pelvic and perineal pain: Secondary | ICD-10-CM | POA: Diagnosis not present

## 2023-11-08 DIAGNOSIS — N6011 Diffuse cystic mastopathy of right breast: Secondary | ICD-10-CM | POA: Insufficient documentation

## 2023-11-08 DIAGNOSIS — R9389 Abnormal findings on diagnostic imaging of other specified body structures: Secondary | ICD-10-CM | POA: Diagnosis not present

## 2023-11-18 ENCOUNTER — Ambulatory Visit: Payer: Self-pay | Admitting: Advanced Practice Midwife

## 2023-11-28 DIAGNOSIS — Z419 Encounter for procedure for purposes other than remedying health state, unspecified: Secondary | ICD-10-CM | POA: Diagnosis not present

## 2023-12-13 ENCOUNTER — Other Ambulatory Visit: Payer: Self-pay

## 2023-12-13 ENCOUNTER — Other Ambulatory Visit: Payer: Self-pay | Admitting: Advanced Practice Midwife

## 2023-12-13 DIAGNOSIS — N6321 Unspecified lump in the left breast, upper outer quadrant: Secondary | ICD-10-CM

## 2023-12-13 DIAGNOSIS — N6312 Unspecified lump in the right breast, upper inner quadrant: Secondary | ICD-10-CM

## 2023-12-13 DIAGNOSIS — N6011 Diffuse cystic mastopathy of right breast: Secondary | ICD-10-CM

## 2023-12-13 DIAGNOSIS — N926 Irregular menstruation, unspecified: Secondary | ICD-10-CM

## 2023-12-13 MED ORDER — NORGESTIMATE-ETH ESTRADIOL 0.25-35 MG-MCG PO TABS: 1.0000 | ORAL_TABLET | Freq: Every day | ORAL | 3 refills | Status: DC

## 2023-12-13 NOTE — Progress Notes (Signed)
 Attempt to place right and left breast ultrasound [facility] but orders placed on 10/28/23 so this attempt is a duplicate order.

## 2023-12-18 ENCOUNTER — Ambulatory Visit
Admission: RE | Admit: 2023-12-18 | Discharge: 2023-12-18 | Disposition: A | Discharge status: Home / Self Care | Source: Ambulatory Visit | Attending: Advanced Practice Midwife | Admitting: Advanced Practice Midwife

## 2023-12-18 ENCOUNTER — Ambulatory Visit

## 2023-12-18 DIAGNOSIS — N6012 Diffuse cystic mastopathy of left breast: Secondary | ICD-10-CM

## 2023-12-18 DIAGNOSIS — R928 Other abnormal and inconclusive findings on diagnostic imaging of breast: Secondary | ICD-10-CM | POA: Diagnosis not present

## 2023-12-28 DIAGNOSIS — Z419 Encounter for procedure for purposes other than remedying health state, unspecified: Secondary | ICD-10-CM | POA: Diagnosis not present

## 2024-01-28 DIAGNOSIS — Z419 Encounter for procedure for purposes other than remedying health state, unspecified: Secondary | ICD-10-CM | POA: Diagnosis not present

## 2024-02-07 NOTE — Progress Notes (Deleted)
 NEUROLOGY FOLLOW UP OFFICE NOTE  Stacey [name] [id number]  Assessment/Plan:   Migraine with aura, without status migrainosus, not intractable, increased frequency and severity Paresthesias unclear etiology but they are infrequent - continue to monitor   Migraine prevention:  Increase propranolol  ER to 80mg  daily.  Requested update in 4 weeks if no improvement Migraine rescue:  She will try eletriptan  40mg  Limit use of pain relievers to no more than 2 days out of week to prevent risk of rebound or medication-overuse headache. Keep headache diary Follow up 6 months.      Subjective:   Stacey [name] is a 33 year old right-handed+ female with hypertension and alpha thalassemia silent carrier who follows up for migraines.  UPDATE: Increased propranolol .   Insurance required trying Nurtec first before eletriptan . *** Intensity:  8/10 Duration:  *** with Nurtec Frequency:  *** Current NSAIDS/analgesics:  *** Current triptans: none Current ergotamine:  none Current anti-emetic:  none Current muscle relaxants:  none Current Antihypertensive medications:  Propranolol  ER 80mg  daily Current Antidepressant medications:  none Current Anticonvulsant medications:  none Current anti-CGRP:  Nurtec PRN *** Current Vitamins/Herbal/Supplements:  magnesium  citrate 400mg  daily Current Antihistamines/Decongestants:  none Other therapy:  none Birth control:  Ortho-Cyclen       Caffeine :  Rarely coffee.  No soda or tea Alcohol:  occasionally glass of wine Smoker:  smokes 3 cigars a day Diet:  Increased water intake.  Sometimes skips meals (mainly breakfast) Exercise:  no Depression:  overall stable; Anxiety:  overall stable Other pain:  back pain Sleep hygiene:  good.   HISTORY: Migraines: Migraines since 61-68 years old.  Typically they are left retro-orbital burning/pressure and sometimes throbbing pain with associated seeing spots.  Some photophobia and phonophobia, sometimes  nausea.  Takes Excedrin or Extra-strength Tylenol  and takes a nap - resolved in 2-3 hours.  Occurs 1 to 2 times a week.  Triggers:  smoking, menses.   Seen in the ED on 08/01/2022 for a particularly severe headache that was ongoing for 3 days.  Also, this was different because it was associated with dizziness, off-balance and located mostly in back of there head.  CT head personally reviewed was unremarkable.  Treated with migraine cocktail which worked.  Has not had a recurrence of that severe migraine.  Paresthesias: B12 and TSH from 08/14/2022 were 402 and 0.91 respectively.    Past medications: Past NSAIDS/analgesics:  Excedrin Migraine, Extra-strength Tylenol , naproxen , Fioricet  with codeine, naproxen , meloxicam , tramadol  Past abortive triptans:  sumatriptan  tab (made her feel ill), rizatriptan Past abortive ergotamine:  none Past muscle relaxants:  Flexeril , Robaxin , Zanflex Past anti-emetic:  Zofran  4mg , Phenergan  25mg  Past antihypertensive medications:  nifedipine  Past antidepressant medications:  Lexapro (aggravated headaches) Past anticonvulsant medications:  none Past anti-CGRP:  none Past vitamins/Herbal/Supplements:  magnesium , riboflavin  Past antihistamines/decongestants: Claritin, Dayquiul Other past therapies:  none    Family history of headache:  mom, maternal grandmother, brothers, father, paternal grandmother  PAST MEDICAL HISTORY: 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 28 tablet 3   propranolol  ER (INDERAL  LA) 80 MG 24 hr capsule Take 1 capsule (80 mg total) by mouth daily. 30 capsule 5   Rimegepant Sulfate (NURTEC) 75 MG TBDP Take 1 tablet (75 mg total) by mouth daily as needed. 8 tablet 11   No current facility-administered medications on file prior to visit.    ALLERGIES: No Known Allergies  FAMILY HISTORY: Family History  Problem Relation Age of Onset   Hypertension Mother    Migraines Mother    Healthy Father    Asthma Brother    Arthritis Maternal Grandmother    Cancer Maternal Grandmother    Arthritis Paternal Grandmother    Anesthesia problems Neg Hx       Objective:  *** General: No acute distress.  Patient appears ***-groomed.   Head:  Normocephalic/atraumatic Eyes:  Fundi examined but not visualized Neck: supple, no paraspinal tenderness, full range of motion Heart:  Regular rate and rhythm Neurological Exam: alert and oriented.  Speech fluent and not dysarthric, language intact.  CN II-XII intact. Bulk and tone normal, muscle strength 5/5 throughout.  Sensation to light touch intact.  Deep tendon reflexes 2+ throughout, toes downgoing.  Finger to nose testing intact.  Gait normal, Romberg negative.   Juliene Dunnings, DO  CC: ***

## 2024-02-10 ENCOUNTER — Ambulatory Visit: Payer: Medicaid Other | Admitting: Neurology

## 2024-02-10 ENCOUNTER — Encounter: Payer: Self-pay | Admitting: Neurology

## 2024-02-28 DIAGNOSIS — Z419 Encounter for procedure for purposes other than remedying health state, unspecified: Secondary | ICD-10-CM | POA: Diagnosis not present

## 2024-03-08 ENCOUNTER — Encounter (HOSPITAL_BASED_OUTPATIENT_CLINIC_OR_DEPARTMENT_OTHER): Payer: Self-pay | Admitting: Emergency Medicine

## 2024-03-08 ENCOUNTER — Emergency Department (HOSPITAL_BASED_OUTPATIENT_CLINIC_OR_DEPARTMENT_OTHER)
Admission: EM | Admit: 2024-03-08 | Discharge: 2024-03-08 | Disposition: A | Discharge status: Home / Self Care | Attending: Emergency Medicine | Admitting: Emergency Medicine

## 2024-03-08 ENCOUNTER — Other Ambulatory Visit: Payer: Self-pay

## 2024-03-08 DIAGNOSIS — J069 Acute upper respiratory infection, unspecified: Secondary | ICD-10-CM | POA: Diagnosis not present

## 2024-03-08 DIAGNOSIS — B9789 Other viral agents as the cause of diseases classified elsewhere: Secondary | ICD-10-CM | POA: Diagnosis not present

## 2024-03-08 DIAGNOSIS — R059 Cough, unspecified: Secondary | ICD-10-CM | POA: Diagnosis present

## 2024-03-08 DIAGNOSIS — R519 Headache, unspecified: Secondary | ICD-10-CM | POA: Diagnosis not present

## 2024-03-08 LAB — RESP PANEL BY RT-PCR (RSV, FLU A&B, COVID)  RVPGX2
Influenza A by PCR: NEGATIVE
Influenza B by PCR: NEGATIVE
Resp Syncytial Virus by PCR: NEGATIVE
SARS Coronavirus 2 by RT PCR: NEGATIVE

## 2024-03-08 MED ORDER — BENZONATATE 100 MG PO CAPS
100.0000 mg | ORAL_CAPSULE | Freq: Three times a day (TID) | ORAL | 0 refills | Status: DC
Start: 2024-03-08 — End: 2024-05-14

## 2024-03-08 MED ORDER — KETOROLAC TROMETHAMINE 15 MG/ML IJ SOLN
30.0000 mg | Freq: Once | INTRAMUSCULAR | Status: AC
  Administered 2024-03-08: 30 mg via INTRAMUSCULAR
  Filled 2024-03-08: qty 2

## 2024-03-08 NOTE — Discharge Instructions (Addendum)
 Please read and follow all provided instructions.  Your diagnoses today include:  1. Viral URI with cough   2. Acute nonintractable headache, unspecified headache type    You appear to have an upper respiratory infection (URI). An upper respiratory tract infection, or cold, is a viral infection of the air passages leading to the lungs. It should improve gradually after 5-7 days. You may have a lingering cough that lasts for 2- 4 weeks after the infection.  Tests performed today include: Vital signs. See below for your results today.  Flu, COVID, RSV testing was negative  Medications prescribed:  Tessalon  Perles - cough suppressant medication  Please use over-the-counter NSAID medications (ibuprofen , naproxen ) or Tylenol  (acetaminophen ) as directed on the packaging for pain -- as long as you do not have any reasons avoid these medications. Reasons to avoid NSAID medications include: weak kidneys, a history of bleeding in your stomach or gut, or uncontrolled high blood pressure or previous heart attack. Reasons to avoid Tylenol  include: liver problems or ongoing alcohol use. Never take more than 4000mg  or 8 Extra strength Tylenol  in a 24 hour period.     Take any prescribed medications only as directed. Treatment for your infection is aimed at treating the symptoms. There are no medications, such as antibiotics, that will cure your infection.   Home care instructions:  You can take Tylenol  and/or Ibuprofen  as directed on the packaging for fever reduction and pain relief.    For cough: honey 1/2 to 1 teaspoon (you can dilute the honey in water or another fluid).  You can also use guaifenesin and dextromethorphan  for cough. You can use a humidifier for chest congestion and cough.  If you don't have a humidifier, you can sit in the bathroom with the hot shower running.      For sore throat: try warm salt water gargles, cepacol lozenges, throat spray, warm tea or water with lemon/honey, popsicles  or ice, or OTC cold relief medicine for throat discomfort.    For congestion: take a daily anti-histamine like Zyrtec, Claritin, and a oral decongestant, such as pseudoephedrine.  You can also use Flonase 1-2 sprays in each nostril daily.    It is important to stay hydrated: drink plenty of fluids (water, gatorade/powerade/pedialyte, juices, or teas) to keep your throat moisturized and help further relieve irritation/discomfort.   Your illness is contagious and can be spread to others, especially during the first 3 or 4 days. It cannot be cured by antibiotics or other medicines. Take basic precautions such as washing your hands often, covering your mouth when you cough or sneeze, and avoiding public places where you could spread your illness to others.   Please continue drinking plenty of fluids.  Use over-the-counter medicines as needed as directed on packaging for symptom relief.  You may also use ibuprofen  or tylenol  as directed on packaging for pain or fever.  Do not take multiple medicines containing Tylenol  or acetaminophen  to avoid taking too much of this medication.  Follow-up instructions: Please follow-up with your primary care provider in the next 3 days for further evaluation of your symptoms if you are not feeling better.   Return instructions:  Please return to the Emergency Department if you experience worsening symptoms.  RETURN IMMEDIATELY IF you develop shortness of breath, confusion or altered mental status, a new rash, become dizzy, faint, or poorly responsive, or are unable to be cared for at home. Please return if you have persistent vomiting and cannot keep down  fluids or develop a fever that is not controlled by tylenol  or motrin .   Please return if you have any other emergent concerns.  Additional Information:  Your vital signs today were: BP (!) 141/95 (BP Location: Right Arm)   Pulse (!) 117   Temp 99.2 F (37.3 C) (Oral)   Resp 20   SpO2 99%  If your blood  pressure (BP) was elevated above 135/85 this visit, please have this repeated by your doctor within one month. --------------

## 2024-03-08 NOTE — ED Triage Notes (Signed)
 Headache body ache and chest cold since Thursday + phlegm, congestion

## 2024-03-08 NOTE — ED Provider Notes (Signed)
 [name] Provider Note   CSN: [id number] Arrival date & time: 03/08/24  1410     Patient presents with: Headache   Stacey [name] is a 33 y.o. female.   Patient with history of hypertension, migraines, smoking --presents to the emergency department today for evaluation of viral illness.  She reports 3 days of symptoms.  She has had headache, body aches, cough and congestion.  States family members have been sick with similar symptoms.  No testing for COVID.  No vomiting or diarrhea.  No treatments prior to arrival.       Prior to Admission medications   Medication Sig Start Date End Date Taking? Authorizing Provider  norgestimate -ethinyl estradiol  (ORTHO-CYCLEN) 0.25-35 MG-MCG tablet Take 1 tablet by mouth daily. 12/13/23   Erik Kieth BROCKS, MD  propranolol  ER (INDERAL  LA) 80 MG 24 hr capsule Take 1 capsule (80 mg total) by mouth daily. 08/07/23   Skeet Juliene SAUNDERS, DO  Rimegepant Sulfate (NURTEC) 75 MG TBDP Take 1 tablet (75 mg total) by mouth daily as needed. 08/09/23   Skeet Juliene SAUNDERS, DO    Allergies: Patient has no known allergies.    Review of Systems  Updated Vital Signs BP (!) 141/95 (BP Location: Right Arm)   Pulse (!) 117   Temp 99.2 F (37.3 C) (Oral)   Resp 20   SpO2 99%   Physical Exam Vitals and nursing note reviewed.  Constitutional:      Appearance: She is well-developed.  HENT:     Head: Normocephalic and atraumatic.     Jaw: No trismus.     Right Ear: Tympanic membrane, ear canal and external ear normal.     Left Ear: Tympanic membrane, ear canal and external ear normal.     Nose: Congestion present. No mucosal edema or rhinorrhea.     Mouth/Throat:     Mouth: Mucous membranes are moist. Mucous membranes are not dry. No oral lesions.     Pharynx: Uvula midline. No oropharyngeal exudate, posterior oropharyngeal erythema or uvula swelling.     Tonsils: No tonsillar abscesses.  Eyes:     General:         Right eye: No discharge.        Left eye: No discharge.     Conjunctiva/sclera: Conjunctivae normal.  Neck:     Comments: No meningeal signs. Cardiovascular:     Rate and Rhythm: Normal rate and regular rhythm.     Heart sounds: Normal heart sounds.  Pulmonary:     Effort: Pulmonary effort is normal. No respiratory distress.     Breath sounds: Normal breath sounds. No wheezing or rales.  Abdominal:     Palpations: Abdomen is soft.     Tenderness: There is no abdominal tenderness.  Musculoskeletal:     Cervical back: Normal range of motion and neck supple.  Lymphadenopathy:     Cervical: No cervical adenopathy.  Skin:    General: Skin is warm and dry.  Neurological:     General: No focal deficit present.     Mental Status: She is alert.     Motor: No weakness.  Psychiatric:        Mood and Affect: Mood normal.     (all labs ordered are listed, but only abnormal results are displayed) Labs Reviewed - No data to display  EKG: None  Radiology: No results found.   Procedures   Medications Ordered in the ED  ketorolac  (TORADOL ) 15  MG/ML injection 30 mg (has no administration in time range)   ED Course  Patient seen and examined. History obtained directly from patient.   Labs/EKG: Ordered COVID testing.  Imaging: None ordered  Medications/Fluids: Ordered: IM Toradol    Most recent vital signs reviewed and are as follows: BP (!) 141/95 (BP Location: Right Arm)   Pulse (!) 117   Temp 99.2 F (37.3 C) (Oral)   Resp 20   SpO2 99%   Initial impression: Viral respiratory infection with bodyaches and associated headache.  Normal gross neuro exam without deficits.  Do not suspect meningitis, no thunderclap headache.  4:00 PM Reassessment performed. Patient appears comfortable, improved, heart rate currently 86 with a blood pressure 110/73.  Labs personally reviewed and interpreted including: Flu and COVID were negative.  Reviewed pertinent lab work and imaging with  patient at bedside. Questions answered.   Most current vital signs reviewed and are as follows: BP 133/84 (BP Location: Right Arm)   Pulse 97   Temp 98.7 F (37.1 C) (Oral)   Resp 18   SpO2 100%   Plan: Discharge to home.   Prescriptions written for: Tessalon   Other home care instructions discussed: NSAIDs, OTC meds, rest and hydration  ED return instructions discussed: Worsening severe headache, vomiting, confusion, high persistent fever, difficulty breathing or shortness of breath.  Follow-up instructions discussed: Patient encouraged to follow-up with their PCP in 5 days if not improving.                                  Medical Decision Making Risk Prescription drug management.   Patient with flulike illness, headaches and bodyaches.  COVID testing negative.  Patient without signs of meningitis, confusion, neuro deficits.  Low concern for bacterial pneumonia.  Low concern for sepsis.  In regards to the patient's headache, critical differentials were considered including subarachnoid hemorrhage, intracerebral hemorrhage, epidural/subdural hematoma, pituitary apoplexy, vertebral/carotid artery dissection, giant cell arteritis, central venous thrombosis, reversible cerebral vasoconstriction, acute angle closure glaucoma, idiopathic intracranial hypertension, bacterial meningitis, viral encephalitis, carbon monoxide poisoning, posterior reversible encephalopathy syndrome, pre-eclampsia.   Reg flag symptoms related to these causes were considered including systemic symptoms (fever, weight loss), neurologic symptoms (confusion, mental status change, vision change, associated seizure), acute or sudden thunderclap onset, patient age 30 or older with new or progressive headache, patient of any age with first headache or change in headache pattern, pregnant or postpartum status, history of HIV or other immunocompromise, history of cancer, headache occurring with exertion, associated neck  or shoulder pain, associated traumatic injury, concurrent use of anticoagulation, family history of spontaneous SAH, and concurrent drug use.    Other benign, more common causes of headache were considered including migraine, tension-type headache, cluster headache, referred pain from other cause such as sinus infection, dental pain, trigeminal neuralgia.   On exam, patient has a reassuring neuro exam including baseline mental status, no significant neck pain or meningeal signs, no signs of severe infection or fever.   Headache likely due to overall picture consistent with viral respiratory infection.  The patient's vital signs, pertinent lab work and imaging were reviewed and interpreted as discussed in the ED course. Hospitalization was considered for further testing, treatments, or serial exams/observation. However as patient is well-appearing, has a stable exam over the course of their evaluation, and reassuring studies today, I do not feel that they warrant admission at this time. This plan was discussed with  the patient who verbalizes agreement and comfort with this plan and seems reliable and able to return to the Emergency Department with worsening or changing symptoms.      Final diagnoses:  Viral URI with cough  Acute nonintractable headache, unspecified headache type    ED Discharge Orders          Ordered    benzonatate  (TESSALON ) 100 MG capsule  Every 8 hours        03/08/24 1559               Desiderio Chew, PA-C 03/08/24 1604    Lenor Hollering, MD 03/08/24 2249

## 2024-03-30 ENCOUNTER — Emergency Department (HOSPITAL_BASED_OUTPATIENT_CLINIC_OR_DEPARTMENT_OTHER): Admitting: Radiology

## 2024-03-30 ENCOUNTER — Emergency Department (HOSPITAL_BASED_OUTPATIENT_CLINIC_OR_DEPARTMENT_OTHER)
Admission: EM | Admit: 2024-03-30 | Discharge: 2024-03-30 | Disposition: A | Discharge status: Home / Self Care | Attending: Emergency Medicine | Admitting: Emergency Medicine

## 2024-03-30 ENCOUNTER — Emergency Department (HOSPITAL_BASED_OUTPATIENT_CLINIC_OR_DEPARTMENT_OTHER)

## 2024-03-30 ENCOUNTER — Other Ambulatory Visit: Payer: Self-pay

## 2024-03-30 DIAGNOSIS — R0789 Other chest pain: Secondary | ICD-10-CM | POA: Diagnosis not present

## 2024-03-30 DIAGNOSIS — Z79899 Other long term (current) drug therapy: Secondary | ICD-10-CM | POA: Insufficient documentation

## 2024-03-30 DIAGNOSIS — R059 Cough, unspecified: Secondary | ICD-10-CM | POA: Diagnosis not present

## 2024-03-30 DIAGNOSIS — R0602 Shortness of breath: Secondary | ICD-10-CM | POA: Diagnosis not present

## 2024-03-30 DIAGNOSIS — R079 Chest pain, unspecified: Secondary | ICD-10-CM | POA: Diagnosis not present

## 2024-03-30 DIAGNOSIS — I10 Essential (primary) hypertension: Secondary | ICD-10-CM | POA: Diagnosis not present

## 2024-03-30 DIAGNOSIS — M791 Myalgia, unspecified site: Secondary | ICD-10-CM | POA: Diagnosis not present

## 2024-03-30 LAB — BASIC METABOLIC PANEL WITH GFR
Anion gap: 12 (ref 5–15)
BUN: 8 mg/dL (ref 6–20)
CO2: 21 mmol/L — ABNORMAL LOW (ref 22–32)
Calcium: 9.6 mg/dL (ref 8.9–10.3)
Chloride: 104 mmol/L (ref 98–111)
Creatinine, Ser: 0.74 mg/dL (ref 0.44–1.00)
GFR, Estimated: >60 mL/min (ref >60)
Glucose, Bld: 120 mg/dL — ABNORMAL HIGH (ref 70–99)
Potassium: 4 mmol/L (ref 3.5–5.1)
Sodium: 138 mmol/L (ref 135–145)

## 2024-03-30 LAB — HEPATIC FUNCTION PANEL
ALT: 13 U/L (ref 0–44)
AST: 22 U/L (ref 15–41)
Albumin: 4.5 g/dL (ref 3.5–5.0)
Alkaline Phosphatase: 61 U/L (ref 38–126)
Bilirubin, Direct: <0.1 mg/dL (ref 0.0–0.2)
Total Bilirubin: 0.3 mg/dL (ref 0.0–1.2)
Total Protein: 7.6 g/dL (ref 6.5–8.1)

## 2024-03-30 LAB — CBC
HCT: 34.2 % — ABNORMAL LOW (ref 36.0–46.0)
Hemoglobin: 11.4 g/dL — ABNORMAL LOW (ref 12.0–15.0)
MCH: 27.2 pg (ref 26.0–34.0)
MCHC: 33.3 g/dL (ref 30.0–36.0)
MCV: 81.6 fL (ref 80.0–100.0)
Platelets: 350 K/uL (ref 150–400)
RBC: 4.19 MIL/uL (ref 3.87–5.11)
RDW: 13.7 % (ref 11.5–15.5)
WBC: 6.4 K/uL (ref 4.0–10.5)
nRBC: 0 % (ref 0.0–0.2)

## 2024-03-30 LAB — RESP PANEL BY RT-PCR (RSV, FLU A&B, COVID)  RVPGX2
Influenza A by PCR: NEGATIVE
Influenza B by PCR: NEGATIVE
Resp Syncytial Virus by PCR: NEGATIVE
SARS Coronavirus 2 by RT PCR: NEGATIVE

## 2024-03-30 LAB — PREGNANCY, URINE: Preg Test, Ur: NEGATIVE

## 2024-03-30 LAB — TROPONIN T, HIGH SENSITIVITY
Troponin T High Sensitivity: <15 ng/L (ref 0–19)
Troponin T High Sensitivity: <15 ng/L (ref 0–19)

## 2024-03-30 MED ORDER — LIDOCAINE 5 % EX PTCH
1.0000 | MEDICATED_PATCH | CUTANEOUS | Status: DC
  Administered 2024-03-30: 1 via TRANSDERMAL
  Filled 2024-03-30: qty 1

## 2024-03-30 MED ORDER — KETOROLAC TROMETHAMINE 15 MG/ML IJ SOLN
15.0000 mg | Freq: Once | INTRAMUSCULAR | Status: AC
Start: 2024-03-30 — End: 2024-03-30
  Administered 2024-03-30: 15 mg via INTRAVENOUS
  Filled 2024-03-30: qty 1

## 2024-03-30 MED ORDER — KETOROLAC TROMETHAMINE 10 MG PO TABS: 10.0000 mg | ORAL_TABLET | Freq: Four times a day (QID) | ORAL | 0 refills | Status: AC | PRN

## 2024-03-30 MED ORDER — IOHEXOL 350 MG/ML SOLN
100.0000 mL | Freq: Once | INTRAVENOUS | Status: AC | PRN
  Administered 2024-03-30: 75 mL via INTRAVENOUS

## 2024-03-30 NOTE — ED Notes (Signed)
 Called lab to add hepatic function panel, spoke with Thersia.

## 2024-03-30 NOTE — ED Triage Notes (Signed)
 C/o right sided CP that rads into back. Painful to take beep breath.

## 2024-03-30 NOTE — Discharge Instructions (Addendum)
 Your workup in the emergency department to include EKG and CT imaging and laboratory evaluation was overall reassuring.  Your symptoms are consistent with likely musculoskeletal chest wall pain for which we have prescribed Toradol .

## 2024-03-30 NOTE — ED Notes (Signed)
 Reviewed AVS/discharge instruction with patient. Time allotted for and all questions answered. Patient is agreeable for d/c and escorted to ed exit by staff.

## 2024-03-30 NOTE — ED Provider Notes (Signed)
 [place] EMERGENCY DEPARTMENT AT [facility] Provider Note   CSN: [id number] Arrival date & time: 03/30/24  9156     Patient presents with: Chest Pain   Stacey [name] is a 33 y.o. female.    Chest Pain    33 year old female with medical history significant for anemia, anxiety, depression, GERD, HSV-2, HTN presenting to the emergency department with a chief complaint of chest pain.  The patient states that she has had right-sided sharp chest pain with associated cough and shortness of breath.  She denies any nausea or vomiting, denies any fever or chills.  Pain is somewhat worse with movement.  Does radiate to her back.  Prior to Admission medications   Medication Sig Start Date End Date Taking? Authorizing Provider  ketorolac  (TORADOL ) 10 MG tablet Take 1 tablet (10 mg total) by mouth every 6 (six) hours as needed. 03/30/24  Yes Jerrol Agent, MD  benzonatate  (TESSALON ) 100 MG capsule Take 1 capsule (100 mg total) by mouth every 8 (eight) hours. 03/08/24   Geiple, Joshua, PA-C  norgestimate -ethinyl estradiol  (ORTHO-CYCLEN) 0.25-35 MG-MCG tablet Take 1 tablet by mouth daily. 12/13/23   Erik Kieth BROCKS, MD  propranolol  ER (INDERAL  LA) 80 MG 24 hr capsule Take 1 capsule (80 mg total) by mouth daily. 08/07/23   Skeet Juliene SAUNDERS, DO  Rimegepant Sulfate (NURTEC) 75 MG TBDP Take 1 tablet (75 mg total) by mouth daily as needed. 08/09/23   Skeet Juliene SAUNDERS, DO    Allergies: Patient has no known allergies.    Review of Systems  Cardiovascular:  Positive for chest pain.  All other systems reviewed and are negative.   Updated Vital Signs BP (!) 140/94   Pulse 72   Temp 97.9 F (36.6 C) (Oral)   Resp 14   SpO2 100%   Physical Exam Vitals and nursing note reviewed.  Constitutional:      General: She is not in acute distress.    Appearance: She is well-developed.  HENT:     Head: Normocephalic and atraumatic.  Eyes:     Conjunctiva/sclera: Conjunctivae normal.   Cardiovascular:     Rate and Rhythm: Normal rate and regular rhythm.     Heart sounds: No murmur heard. Pulmonary:     Effort: Pulmonary effort is normal. No respiratory distress.     Breath sounds: Normal breath sounds.  Chest:     Comments: Right sided chest wall tenderness that reproduces the patient's pain Abdominal:     Palpations: Abdomen is soft.     Tenderness: There is no abdominal tenderness. Negative signs include Murphy's sign.     Comments: Abdomen is soft, nontender, nondistended, no rebound or guarding, no tenderness to palpation of the right upper quadrant, negative Murphy sign  Musculoskeletal:        General: No swelling.     Cervical back: Neck supple.  Skin:    General: Skin is warm and dry.     Capillary Refill: Capillary refill takes less than 2 seconds.  Neurological:     Mental Status: She is alert.  Psychiatric:        Mood and Affect: Mood normal.     (all labs ordered are listed, but only abnormal results are displayed) Labs Reviewed  BASIC METABOLIC PANEL WITH GFR - Abnormal; Notable for the following components:      Result Value   CO2 21 (*)    Glucose, Bld 120 (*)    All other components within normal limits  CBC - Abnormal; Notable for the following components:   Hemoglobin 11.4 (*)    HCT 34.2 (*)    All other components within normal limits  RESP PANEL BY RT-PCR (RSV, FLU A&B, COVID)  RVPGX2  PREGNANCY, URINE  HEPATIC FUNCTION PANEL  TROPONIN T, HIGH SENSITIVITY  TROPONIN T, HIGH SENSITIVITY    EKG: EKG Interpretation Date/Time:  Monday March 30 2024 08:52:59 EDT Ventricular Rate:  84 PR Interval:  141 QRS Duration:  87 QT Interval:  383 QTC Calculation: 453 R Axis:   36  Text Interpretation: Sinus rhythm Low voltage, precordial leads Confirmed by Jerrol Agent ([id number]) on 03/30/2024 9:02:43 AM  Radiology: CT Angio Chest PE W and/or Wo Contrast Result Date: 03/30/2024 CLINICAL DATA:  Right-sided chest pain. EXAM: CT  ANGIOGRAPHY CHEST WITH CONTRAST TECHNIQUE: Multidetector CT imaging of the chest was performed using the standard protocol during bolus administration of intravenous contrast. Multiplanar CT image reconstructions and MIPs were obtained to evaluate the vascular anatomy. RADIATION DOSE REDUCTION: This exam was performed according to the departmental dose-optimization program which includes automated exposure control, adjustment of the mA and/or kV according to patient size and/or use of iterative reconstruction technique. CONTRAST:  75mL OMNIPAQUE  IOHEXOL  350 MG/ML SOLN COMPARISON:  None Available. FINDINGS: Cardiovascular: Satisfactory opacification of the pulmonary arteries to the segmental level. No evidence of pulmonary embolism. Normal heart size. No pericardial effusion. Mediastinum/Nodes: No enlarged mediastinal, hilar, or axillary lymph nodes. Thyroid  gland, trachea, and esophagus demonstrate no significant findings. Lungs/Pleura: Lungs are clear. No pleural effusion or pneumothorax. Upper Abdomen: No acute abnormality. Musculoskeletal: No chest wall abnormality. No acute or significant osseous findings. Review of the MIP images confirms the above findings. IMPRESSION: No evidence of pulmonary embolism or acute cardiopulmonary disease. Electronically Signed   By: Suzen Dials M.D.   On: 03/30/2024 11:14   DG Chest 2 View Result Date: 03/30/2024 CLINICAL DATA:  Right-sided chest pain. EXAM: CHEST - 2 VIEW COMPARISON:  April 01, 2023 FINDINGS: The heart size and mediastinal contours are within normal limits. Both lungs are clear. The visualized skeletal structures are unremarkable. IMPRESSION: No active cardiopulmonary disease. Electronically Signed   By: Suzen Dials M.D.   On: 03/30/2024 10:09     Procedures   Medications Ordered in the ED  lidocaine  (LIDODERM ) 5 % 1 patch (1 patch Transdermal Patch Applied 03/30/24 1117)  iohexol  (OMNIPAQUE ) 350 MG/ML injection 100 mL (75 mLs  Intravenous Contrast Given 03/30/24 1052)  ketorolac  (TORADOL ) 15 MG/ML injection 15 mg (15 mg Intravenous Given 03/30/24 1244)                                    Medical Decision Making Amount and/or Complexity of Data Reviewed Labs: ordered. Radiology: ordered.  Risk Prescription drug management.    33 year old female with medical history significant for anemia, anxiety, depression, GERD, HSV-2, HTN presenting to the emergency department with a chief complaint of chest pain.  The patient states that she has had right-sided sharp chest pain with associated cough and shortness of breath.  She denies any nausea or vomiting, denies any fever or chills.  Pain is somewhat worse with movement.  Does radiate to her back.  On arrival, the patient was afebrile, not tachycardic or tachypneic, mildly elevated blood pressure at 136/93, saturating 100% on room air.  Patient presents with right-sided chest pain, differential diagnosis includes musculoskeletal pain, no rash to suggest herpes  zoster, considered pneumothorax, pneumonia, viral infection, PE.  No right upper quadrant tenderness, low concern for cholelithiasis/cholecystitis.  With right-sided chest pain, low concern for ACS.  EKG: Ventricular rate 84, sinus rhythm noted, low voltage in the precordial leads, no acute ischemic changes.  CXR: No cardiopulmonary disease.  Labs: COVID, flu, RSV PCR testing negative, cardiac troponin less than 15, repeat cardiac troponin also normal, urine pregnancy negative, CBC without a leukocytosis, mild anemia to 11.4 noted, BMP with mildly low bicarb at 21, normal anion gap, normal renal function, hepatic function panel normal with normal T. bili and LFTs.  With right-sided chest pain, CTA PE study was obtained which resulted negative for PE.  The patient was administered Toradol  for suspected musculoskeletal pain, on repeat assessment was feeling symptomatically improved, low concern for acute intrathoracic  abnormality with reassuring workup at this time, will prescribe Toradol  tablets for likely musculoskeletal chest pain.  Advised PCP follow-up, return precautions provided, stable for discharge.     Final diagnoses:  Musculoskeletal chest pain    ED Discharge Orders          Ordered    ketorolac  (TORADOL ) 10 MG tablet  Every 6 hours PRN        03/30/24 1301               Jerrol Agent, MD 03/30/24 1835

## 2024-03-31 ENCOUNTER — Other Ambulatory Visit: Payer: Self-pay | Admitting: Obstetrics and Gynecology

## 2024-03-31 DIAGNOSIS — N926 Irregular menstruation, unspecified: Secondary | ICD-10-CM

## 2024-04-01 ENCOUNTER — Other Ambulatory Visit: Payer: Self-pay | Admitting: Obstetrics & Gynecology

## 2024-04-01 ENCOUNTER — Other Ambulatory Visit: Payer: Self-pay

## 2024-04-01 DIAGNOSIS — N926 Irregular menstruation, unspecified: Secondary | ICD-10-CM

## 2024-04-01 MED ORDER — NORGESTIMATE-ETH ESTRADIOL 0.25-35 MG-MCG PO TABS: 1.0000 | ORAL_TABLET | Freq: Every day | ORAL | 0 refills | Status: DC

## 2024-04-01 NOTE — Progress Notes (Signed)
 [facility] refill sent per protocol. Message to provide for yearly refill due to last annual on 10/28/23

## 2024-04-30 ENCOUNTER — Other Ambulatory Visit: Payer: Self-pay | Admitting: Obstetrics & Gynecology

## 2024-04-30 DIAGNOSIS — N926 Irregular menstruation, unspecified: Secondary | ICD-10-CM

## 2024-05-01 ENCOUNTER — Other Ambulatory Visit: Payer: Self-pay

## 2024-05-01 DIAGNOSIS — N926 Irregular menstruation, unspecified: Secondary | ICD-10-CM

## 2024-05-01 MED ORDER — NORGESTIMATE-ETH ESTRADIOL 0.25-35 MG-MCG PO TABS: 1.0000 | ORAL_TABLET | Freq: Every day | ORAL | 3 refills | Status: AC

## 2024-05-01 NOTE — Progress Notes (Signed)
 [facility] refill sent Last annual 10/28/23

## 2024-05-14 ENCOUNTER — Encounter (HOSPITAL_COMMUNITY): Payer: Self-pay | Admitting: Emergency Medicine

## 2024-05-14 ENCOUNTER — Emergency Department (HOSPITAL_COMMUNITY)
Admission: EM | Admit: 2024-05-14 | Discharge: 2024-05-14 | Disposition: A | Discharge status: Home / Self Care | Attending: Emergency Medicine | Admitting: Emergency Medicine

## 2024-05-14 ENCOUNTER — Other Ambulatory Visit: Payer: Self-pay

## 2024-05-14 DIAGNOSIS — F17290 Nicotine dependence, other tobacco product, uncomplicated: Secondary | ICD-10-CM | POA: Insufficient documentation

## 2024-05-14 DIAGNOSIS — J069 Acute upper respiratory infection, unspecified: Secondary | ICD-10-CM | POA: Insufficient documentation

## 2024-05-14 DIAGNOSIS — I10 Essential (primary) hypertension: Secondary | ICD-10-CM | POA: Diagnosis not present

## 2024-05-14 DIAGNOSIS — R059 Cough, unspecified: Secondary | ICD-10-CM | POA: Diagnosis present

## 2024-05-14 LAB — RESP PANEL BY RT-PCR (RSV, FLU A&B, COVID)  RVPGX2
Influenza A by PCR: NEGATIVE
Influenza B by PCR: NEGATIVE
Resp Syncytial Virus by PCR: NEGATIVE
SARS Coronavirus 2 by RT PCR: NEGATIVE

## 2024-05-14 MED ORDER — BENZONATATE 100 MG PO CAPS: 100.0000 mg | ORAL_CAPSULE | Freq: Three times a day (TID) | ORAL | 0 refills | Status: AC

## 2024-05-14 NOTE — ED Triage Notes (Signed)
 Pt c/o persistent cough and body aches since yesterday.

## 2024-05-14 NOTE — ED Provider Notes (Signed)
 [place] EMERGENCY DEPARTMENT AT [facility] Provider Note   CSN: [id number] Arrival date & time: 05/14/24  0011     Patient presents with: Cough   Stacey [name] is a 33 y.o. female.  Patient with past medical history significant for GERD, hypertension presents emergency department complaining of 1 day of cough and bodyaches.  She denies fever, nausea, vomiting, chest pain, shortness of breath.  She states that her children have had some minor colds.    Cough      Prior to Admission medications   Medication Sig Start Date End Date Taking? Authorizing Provider  benzonatate  (TESSALON ) 100 MG capsule Take 1 capsule (100 mg total) by mouth every 8 (eight) hours. 05/14/24  Yes Logan Ubaldo NOVAK, PA-C  ketorolac  (TORADOL ) 10 MG tablet Take 1 tablet (10 mg total) by mouth every 6 (six) hours as needed. 03/30/24   Jerrol Agent, MD  norgestimate -ethinyl estradiol  (ORTHO-CYCLEN) 0.25-35 MG-MCG tablet Take 1 tablet by mouth daily. 05/01/24   Leftwich-Kirby, Olam LABOR, CNM  propranolol  ER (INDERAL  LA) 80 MG 24 hr capsule Take 1 capsule (80 mg total) by mouth daily. 08/07/23   Skeet Juliene SAUNDERS, DO  Rimegepant Sulfate (NURTEC) 75 MG TBDP Take 1 tablet (75 mg total) by mouth daily as needed. 08/09/23   Skeet Juliene SAUNDERS, DO    Allergies: Patient has no known allergies.    Review of Systems  Respiratory:  Positive for cough.     Updated Vital Signs BP (!) 138/98 (BP Location: Right Arm)   Pulse 86   Temp 98.2 F (36.8 C)   Resp 17   SpO2 100%   Physical Exam Vitals and nursing note reviewed.  Constitutional:      General: She is not in acute distress.    Appearance: She is well-developed.  HENT:     Head: Normocephalic and atraumatic.  Eyes:     Conjunctiva/sclera: Conjunctivae normal.  Pulmonary:     Effort: Pulmonary effort is normal. No respiratory distress.     Breath sounds: Normal breath sounds.  Musculoskeletal:        General: No swelling.  Skin:    General: Skin  is warm and dry.  Neurological:     Mental Status: She is alert.  Psychiatric:        Mood and Affect: Mood normal.     (all labs ordered are listed, but only abnormal results are displayed) Labs Reviewed  RESP PANEL BY RT-PCR (RSV, FLU A&B, COVID)  [id number]    EKG: None  Radiology: No results found.   Procedures   Medications Ordered in the ED - No data to display                                  Medical Decision Making  This patient presents to the ED for concern of cough and bodyaches, this involves an extensive number of treatment options, and is a complaint that carries with it a high risk of complications and morbidity.  The differential diagnosis includes COVID, influenza, RSV, other viral respiratory illness, pneumonia, others   Co morbidities / Chronic conditions that complicate the patient evaluation  As noted in HPI   Additional history obtained:  Additional history obtained from EMR   Lab Tests:  I Ordered, and personally interpreted labs.  The pertinent results include: Negative respiratory panel    Social Determinants of Health:  Patient is a  daily smoker, has Medicaid for her primary health insurance type   Test / Admission - Considered:  Patient with negative respiratory panel.  Lungs are clear to auscultation bilaterally.  Patient is afebrile.  No suspicion at this time with pneumonia.  Symptoms seem consistent with a viral upper respiratory infection.  I have discussed supportive care measures to be taken at home.  I will prescribe Tessalon  for cough.  No indication for further emergent workup or admission.  Patient stable for discharge home at this time      Final diagnoses:  Upper respiratory tract infection, unspecified type    ED Discharge Orders          Ordered    benzonatate  (TESSALON ) 100 MG capsule  Every 8 hours        05/14/24 0140               Logan Ubaldo NOVAK, PA-C 05/14/24 0140    Midge Golas,  MD 05/14/24 [phone]

## 2024-05-14 NOTE — Discharge Instructions (Addendum)
 You appear to have an upper respiratory infection.  This is likely viral.  Your body will need time and rest to fight the infection.  I have prescribed Tessalon  which is a cough medication.  Please take as directed.  Follow-up as needed with your primary care provider.  Return to the emergency department if you develop any life-threatening symptoms

## 2024-06-29 ENCOUNTER — Encounter (HOSPITAL_BASED_OUTPATIENT_CLINIC_OR_DEPARTMENT_OTHER): Payer: Self-pay | Admitting: Emergency Medicine

## 2024-06-29 ENCOUNTER — Other Ambulatory Visit: Payer: Self-pay

## 2024-06-29 DIAGNOSIS — Z5321 Procedure and treatment not carried out due to patient leaving prior to being seen by health care provider: Secondary | ICD-10-CM | POA: Insufficient documentation

## 2024-06-29 DIAGNOSIS — J029 Acute pharyngitis, unspecified: Secondary | ICD-10-CM | POA: Insufficient documentation

## 2024-06-29 NOTE — ED Triage Notes (Signed)
 Sore throat since yesterday and right shoulder. Denies cough congestion. White patches visualized on tonsils.

## 2024-06-30 ENCOUNTER — Emergency Department (HOSPITAL_BASED_OUTPATIENT_CLINIC_OR_DEPARTMENT_OTHER)
Admission: EM | Admit: 2024-06-30 | Discharge: 2024-06-30 | Attending: Emergency Medicine | Admitting: Emergency Medicine

## 2024-06-30 LAB — GROUP A STREP BY PCR: Group A Strep by PCR: NOT DETECTED

## 2024-07-24 ENCOUNTER — Other Ambulatory Visit: Payer: Self-pay

## 2024-07-24 ENCOUNTER — Encounter (HOSPITAL_BASED_OUTPATIENT_CLINIC_OR_DEPARTMENT_OTHER): Payer: Self-pay | Admitting: Emergency Medicine

## 2024-07-24 ENCOUNTER — Emergency Department (HOSPITAL_BASED_OUTPATIENT_CLINIC_OR_DEPARTMENT_OTHER)
Admission: EM | Admit: 2024-07-24 | Discharge: 2024-07-24 | Disposition: A | Discharge status: Home / Self Care | Source: Home / Self Care | Attending: Emergency Medicine | Admitting: Emergency Medicine

## 2024-07-24 DIAGNOSIS — G43909 Migraine, unspecified, not intractable, without status migrainosus: Secondary | ICD-10-CM

## 2024-07-24 MED ORDER — SODIUM CHLORIDE 0.9 % IV BOLUS
1000.0000 mL | Freq: Once | INTRAVENOUS | Status: AC
  Administered 2024-07-24: 1000 mL via INTRAVENOUS

## 2024-07-24 MED ORDER — DIPHENHYDRAMINE HCL 50 MG/ML IJ SOLN
25.0000 mg | Freq: Once | INTRAMUSCULAR | Status: AC
  Administered 2024-07-24: 25 mg via INTRAVENOUS
  Filled 2024-07-24: qty 1

## 2024-07-24 MED ORDER — METOCLOPRAMIDE HCL 5 MG/ML IJ SOLN
10.0000 mg | Freq: Once | INTRAMUSCULAR | Status: AC
  Administered 2024-07-24: 10 mg via INTRAVENOUS
  Filled 2024-07-24: qty 2

## 2024-07-24 MED ORDER — DEXAMETHASONE SOD PHOSPHATE PF 10 MG/ML IJ SOLN
10.0000 mg | Freq: Once | INTRAMUSCULAR | Status: AC
  Administered 2024-07-24: 10 mg via INTRAVENOUS
  Filled 2024-07-24: qty 1

## 2024-07-24 NOTE — ED Triage Notes (Signed)
 Pt caox4 c/o ambulatory c/o migraine x3 days, hx same. Pt states she has taken Toradol  and Excedrin with no relief. No meds taken today.

## 2024-07-24 NOTE — Discharge Instructions (Signed)
 Make an appointment to have follow-up with your neurologist.  Return to the emergency room if you have any worsening symptoms.

## 2024-07-24 NOTE — ED Provider Notes (Signed)
 " [place] EMERGENCY DEPARTMENT AT [facility] Provider Note   CSN: [id number] Arrival date & time: 07/24/24  1028     Patient presents with: Migraine   Stacey [name] is a 34 y.o. female.   Patient is a 34 year old female with a history of migraines who presents with a migrainous type headache.  She said it started 3 days ago.  She says on the right side of her head which is her typical migraine location.  There is no associated neck pain or stiffness.  No fevers.  She has had some nausea and vomiting with it.  She said her pain is similar to her prior migrainous type headaches.  She is taking her home medication without improvement in symptoms.  She does have a neurologist that she sees.       Prior to Admission medications  Medication Sig Start Date End Date Taking? Authorizing Provider  benzonatate  (TESSALON ) 100 MG capsule Take 1 capsule (100 mg total) by mouth every 8 (eight) hours. 05/14/24   Logan Ubaldo NOVAK, PA-C  ketorolac  (TORADOL ) 10 MG tablet Take 1 tablet (10 mg total) by mouth every 6 (six) hours as needed. 03/30/24   Jerrol Agent, MD  norgestimate -ethinyl estradiol  (ORTHO-CYCLEN) 0.25-35 MG-MCG tablet Take 1 tablet by mouth daily. 05/01/24   Leftwich-Kirby, Olam LABOR, CNM  propranolol  ER (INDERAL  LA) 80 MG 24 hr capsule Take 1 capsule (80 mg total) by mouth daily. 08/07/23   Skeet Juliene SAUNDERS, DO  Rimegepant Sulfate (NURTEC) 75 MG TBDP Take 1 tablet (75 mg total) by mouth daily as needed. 08/09/23   Skeet Juliene SAUNDERS, DO    Allergies: Patient has no known allergies.    Review of Systems  Constitutional:  Negative for chills, diaphoresis, fatigue and fever.  HENT:  Negative for congestion, rhinorrhea and sneezing.   Eyes: Negative.   Respiratory:  Negative for cough, chest tightness and shortness of breath.   Cardiovascular:  Negative for chest pain and leg swelling.  Gastrointestinal:  Positive for nausea and vomiting. Negative for abdominal pain.  Genitourinary:   Negative for difficulty urinating, flank pain and frequency.  Musculoskeletal:  Negative for arthralgias and back pain.  Skin:  Negative for rash.  Neurological:  Positive for headaches. Negative for dizziness, speech difficulty, weakness and numbness.    Updated Vital Signs BP (!) 143/90   Pulse 76   Temp 98.8 F (37.1 C) (Oral)   Resp 16   Ht 5' (1.524 m)   Wt 74.8 kg   LMP 07/20/2024 (Exact Date)   SpO2 100%   BMI 32.22 kg/m   Physical Exam Constitutional:      Appearance: She is well-developed.  HENT:     Head: Normocephalic and atraumatic.  Eyes:     Pupils: Pupils are equal, round, and reactive to light.  Cardiovascular:     Rate and Rhythm: Normal rate and regular rhythm.     Heart sounds: Normal heart sounds.  Pulmonary:     Effort: Pulmonary effort is normal. No respiratory distress.     Breath sounds: Normal breath sounds. No wheezing or rales.  Chest:     Chest wall: No tenderness.  Abdominal:     General: Bowel sounds are normal.     Palpations: Abdomen is soft.     Tenderness: There is no abdominal tenderness. There is no guarding or rebound.  Musculoskeletal:        General: Normal range of motion.     Cervical back: Normal  range of motion and neck supple.  Lymphadenopathy:     Cervical: No cervical adenopathy.  Skin:    General: Skin is warm and dry.     Findings: No rash.  Neurological:     Mental Status: She is alert and oriented to person, place, and time.     Comments: Motor 5/5 all extremities Sensation grossly intact to LT all extremities Finger to Nose intact, no pronator drift CN II-XII grossly intact       (all labs ordered are listed, but only abnormal results are displayed) Labs Reviewed - No data to display  EKG: None  Radiology: No results found.   Procedures   Medications Ordered in the ED  dexamethasone  (DECADRON ) injection 10 mg (10 mg Intravenous Given 07/24/24 1245)  diphenhydrAMINE  (BENADRYL ) injection 25 mg (25 mg  Intravenous Given 07/24/24 1243)  metoCLOPramide  (REGLAN ) injection 10 mg (10 mg Intravenous Given 07/24/24 1244)  sodium chloride  0.9 % bolus 1,000 mL (1,000 mLs Intravenous New Bag/Given 07/24/24 1242)                                    Medical Decision Making Risk Prescription drug management.   This patient presents to the ED for concern of headache, this involves an extensive number of treatment options, and is a complaint that carries with it a high risk of complications and morbidity.  I considered the following differential and admission for this acute, potentially life threatening condition.  The differential diagnosis includes migraine, intracranial hemorrhage, subarachnoid hemorrhage, meningitis, venous sinus thrombosis, tension headache  MDM:    Patient is a 34 year old who presents with headache.  She says it similar to her migrainous type headaches.  Its been going on for 3 days.  No recent trauma.  No neurologic deficits.  No associated neck pain.  No symptoms that would be more concerning for meningitis or subarachnoid hemorrhage.  She was given a migraine cocktail and is feeling much better.  She says her headache is much improved.  She was discharged home in good condition.  She was encouraged to follow-up with her neurologist.  Return precautions were given.  (Labs, imaging, consults)  Labs: I Ordered, and personally interpreted labs.  The pertinent results include: None  Imaging Studies ordered: I ordered imaging studies including none I independently visualized and interpreted imaging. I agree with the radiologist interpretation  Additional history obtained from  .  External records from outside source obtained and reviewed including prior notes  Cardiac Monitoring: The patient was not maintained on a cardiac monitor.  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   On Valtrex -confirm taking prophylaxis > 34 wks   HSV-2 infection    No hx of outbreak   Hx of preeclampsia, prior pregnancy, currently pregnant, third trimester 11/09/2016   Postpartum Normal CMP at baseline Took ASA for  several weeks but then stopped   Hypertension    Kidney infection    kidney infection 2012   Migraine      Medicines Meds ordered this encounter  Medications   dexamethasone  (DECADRON ) injection 10 mg   diphenhydrAMINE  (BENADRYL ) injection 25 mg   metoCLOPramide  (REGLAN ) injection 10 mg   sodium chloride  0.9 % bolus 1,000 mL    I have reviewed the patients home medicines and have made adjustments as needed  Problem List / ED Course: Problem List Items Addressed This Visit       Cardiovascular and Mediastinum   Migraines - Primary             Final diagnoses:  None    ED Discharge Orders     None          Lenor Hollering, MD 07/24/24 1420  "

## 2024-07-24 NOTE — ED Notes (Signed)
 Reviewed discharge instructions and follow-up care with pt. Pt verbalized understanding and had no further questions. Pt exited ED without complications.
